# Patient Record
Sex: Female | Born: 1945 | Race: Black or African American | Hispanic: No | Smoking: Former smoker
Health system: Southern US, Community
[De-identification: ages and names within clinical notes are randomized; demographics above are authoritative.]

## PROBLEM LIST (undated history)

## (undated) DIAGNOSIS — D759 Disease of blood and blood-forming organs, unspecified: Secondary | ICD-10-CM

## (undated) DIAGNOSIS — D126 Benign neoplasm of colon, unspecified: Secondary | ICD-10-CM

## (undated) DIAGNOSIS — J849 Interstitial pulmonary disease, unspecified: Secondary | ICD-10-CM

## (undated) DIAGNOSIS — F32A Depression, unspecified: Secondary | ICD-10-CM

## (undated) DIAGNOSIS — H269 Unspecified cataract: Secondary | ICD-10-CM

## (undated) DIAGNOSIS — I10 Essential (primary) hypertension: Secondary | ICD-10-CM

## (undated) DIAGNOSIS — Z5189 Encounter for other specified aftercare: Secondary | ICD-10-CM

## (undated) DIAGNOSIS — F419 Anxiety disorder, unspecified: Secondary | ICD-10-CM

## (undated) DIAGNOSIS — K648 Other hemorrhoids: Secondary | ICD-10-CM

## (undated) DIAGNOSIS — R0602 Shortness of breath: Secondary | ICD-10-CM

## (undated) DIAGNOSIS — T7840XA Allergy, unspecified, initial encounter: Secondary | ICD-10-CM

## (undated) DIAGNOSIS — Z853 Personal history of malignant neoplasm of breast: Secondary | ICD-10-CM

## (undated) DIAGNOSIS — J449 Chronic obstructive pulmonary disease, unspecified: Secondary | ICD-10-CM

## (undated) DIAGNOSIS — D649 Anemia, unspecified: Secondary | ICD-10-CM

## (undated) DIAGNOSIS — C189 Malignant neoplasm of colon, unspecified: Secondary | ICD-10-CM

## (undated) DIAGNOSIS — G709 Myoneural disorder, unspecified: Secondary | ICD-10-CM

## (undated) DIAGNOSIS — IMO0002 Reserved for concepts with insufficient information to code with codable children: Secondary | ICD-10-CM

## (undated) DIAGNOSIS — R011 Cardiac murmur, unspecified: Secondary | ICD-10-CM

## (undated) DIAGNOSIS — C801 Malignant (primary) neoplasm, unspecified: Secondary | ICD-10-CM

## (undated) DIAGNOSIS — N189 Chronic kidney disease, unspecified: Secondary | ICD-10-CM

## (undated) DIAGNOSIS — D573 Sickle-cell trait: Secondary | ICD-10-CM

## (undated) DIAGNOSIS — K469 Unspecified abdominal hernia without obstruction or gangrene: Secondary | ICD-10-CM

## (undated) DIAGNOSIS — F329 Major depressive disorder, single episode, unspecified: Secondary | ICD-10-CM

## (undated) DIAGNOSIS — C50911 Malignant neoplasm of unspecified site of right female breast: Secondary | ICD-10-CM

## (undated) DIAGNOSIS — R232 Flushing: Secondary | ICD-10-CM

## (undated) DIAGNOSIS — M1990 Unspecified osteoarthritis, unspecified site: Secondary | ICD-10-CM

## (undated) DIAGNOSIS — Z85528 Personal history of other malignant neoplasm of kidney: Secondary | ICD-10-CM

## (undated) DIAGNOSIS — Z85118 Personal history of other malignant neoplasm of bronchus and lung: Secondary | ICD-10-CM

## (undated) DIAGNOSIS — Z85038 Personal history of other malignant neoplasm of large intestine: Secondary | ICD-10-CM

## (undated) DIAGNOSIS — K219 Gastro-esophageal reflux disease without esophagitis: Secondary | ICD-10-CM

## (undated) DIAGNOSIS — C3490 Malignant neoplasm of unspecified part of unspecified bronchus or lung: Secondary | ICD-10-CM

## (undated) HISTORY — DX: Other hemorrhoids: K64.8

## (undated) HISTORY — DX: Anxiety disorder, unspecified: F41.9

## (undated) HISTORY — DX: Benign neoplasm of colon, unspecified: D12.6

## (undated) HISTORY — DX: Essential (primary) hypertension: I10

## (undated) HISTORY — DX: Personal history of other malignant neoplasm of bronchus and lung: Z85.118

## (undated) HISTORY — PX: ABDOMINAL HYSTERECTOMY: SHX81

## (undated) HISTORY — DX: Cardiac murmur, unspecified: R01.1

## (undated) HISTORY — DX: Depression, unspecified: F32.A

## (undated) HISTORY — DX: Flushing: R23.2

## (undated) HISTORY — DX: Sickle-cell trait: D57.3

## (undated) HISTORY — DX: Major depressive disorder, single episode, unspecified: F32.9

## (undated) HISTORY — DX: Unspecified abdominal hernia without obstruction or gangrene: K46.9

## (undated) HISTORY — PX: BREAST LUMPECTOMY: SHX2

## (undated) HISTORY — PX: TUBAL LIGATION: SHX77

## (undated) HISTORY — DX: Personal history of other malignant neoplasm of kidney: Z85.528

## (undated) HISTORY — PX: COLON SURGERY: SHX602

## (undated) HISTORY — DX: Malignant (primary) neoplasm, unspecified: C80.1

## (undated) HISTORY — DX: Interstitial pulmonary disease, unspecified: J84.9

## (undated) HISTORY — DX: Personal history of other malignant neoplasm of large intestine: Z85.038

## (undated) HISTORY — PX: LUNG LOBECTOMY: SHX167

## (undated) HISTORY — PX: TONSILLECTOMY: SUR1361

## (undated) HISTORY — PX: COLONOSCOPY: SHX174

## (undated) HISTORY — PX: CATARACT EXTRACTION, BILATERAL: SHX1313

## (undated) HISTORY — DX: Anemia, unspecified: D64.9

## (undated) HISTORY — PX: PARTIAL HYSTERECTOMY: SHX80

## (undated) HISTORY — DX: Unspecified osteoarthritis, unspecified site: M19.90

## (undated) HISTORY — PX: BREAST BIOPSY: SHX20

## (undated) HISTORY — DX: Malignant neoplasm of unspecified site of right female breast: C50.911

## (undated) HISTORY — DX: Personal history of malignant neoplasm of breast: Z85.3

## (undated) HISTORY — PX: POLYPECTOMY: SHX149

---

## 1997-07-25 DIAGNOSIS — Z5189 Encounter for other specified aftercare: Secondary | ICD-10-CM

## 1997-07-25 DIAGNOSIS — N189 Chronic kidney disease, unspecified: Secondary | ICD-10-CM

## 1997-07-25 HISTORY — PX: KIDNEY SURGERY: SHX687

## 1997-07-25 HISTORY — DX: Encounter for other specified aftercare: Z51.89

## 1997-07-25 HISTORY — DX: Chronic kidney disease, unspecified: N18.9

## 1998-03-30 ENCOUNTER — Ambulatory Visit (HOSPITAL_COMMUNITY): Admission: RE | Admit: 1998-03-30 | Discharge: 1998-03-30 | Payer: Self-pay | Admitting: Gastroenterology

## 1998-04-03 ENCOUNTER — Other Ambulatory Visit: Admission: RE | Admit: 1998-04-03 | Discharge: 1998-04-03 | Payer: Self-pay | Admitting: Gastroenterology

## 1998-04-08 ENCOUNTER — Ambulatory Visit (HOSPITAL_COMMUNITY): Admission: RE | Admit: 1998-04-08 | Discharge: 1998-04-08 | Payer: Self-pay | Admitting: Gastroenterology

## 1998-04-08 ENCOUNTER — Encounter: Payer: Self-pay | Admitting: Gastroenterology

## 1998-04-10 ENCOUNTER — Ambulatory Visit (HOSPITAL_COMMUNITY): Admission: RE | Admit: 1998-04-10 | Discharge: 1998-04-10 | Payer: Self-pay | Admitting: Gastroenterology

## 1998-04-20 ENCOUNTER — Encounter (HOSPITAL_BASED_OUTPATIENT_CLINIC_OR_DEPARTMENT_OTHER): Payer: Self-pay | Admitting: General Surgery

## 1998-04-20 ENCOUNTER — Ambulatory Visit (HOSPITAL_COMMUNITY): Admission: RE | Admit: 1998-04-20 | Discharge: 1998-04-20 | Payer: Self-pay | Admitting: General Surgery

## 1998-04-27 ENCOUNTER — Encounter: Payer: Self-pay | Admitting: Urology

## 1998-04-27 ENCOUNTER — Ambulatory Visit (HOSPITAL_COMMUNITY): Admission: RE | Admit: 1998-04-27 | Discharge: 1998-04-27 | Payer: Self-pay | Admitting: Urology

## 1998-05-14 ENCOUNTER — Inpatient Hospital Stay (HOSPITAL_COMMUNITY): Admission: RE | Admit: 1998-05-14 | Discharge: 1998-05-19 | Payer: Self-pay | Admitting: General Surgery

## 1998-05-14 ENCOUNTER — Encounter (HOSPITAL_BASED_OUTPATIENT_CLINIC_OR_DEPARTMENT_OTHER): Payer: Self-pay | Admitting: General Surgery

## 1998-05-28 ENCOUNTER — Encounter: Admission: RE | Admit: 1998-05-28 | Discharge: 1998-08-26 | Payer: Self-pay | Admitting: General Surgery

## 1999-04-27 ENCOUNTER — Other Ambulatory Visit: Admission: RE | Admit: 1999-04-27 | Discharge: 1999-04-27 | Payer: Self-pay | Admitting: General Surgery

## 1999-05-14 ENCOUNTER — Encounter (HOSPITAL_BASED_OUTPATIENT_CLINIC_OR_DEPARTMENT_OTHER): Payer: Self-pay | Admitting: General Surgery

## 1999-05-17 ENCOUNTER — Ambulatory Visit (HOSPITAL_COMMUNITY): Admission: RE | Admit: 1999-05-17 | Discharge: 1999-05-17 | Payer: Self-pay | Admitting: General Surgery

## 1999-06-21 ENCOUNTER — Other Ambulatory Visit: Admission: RE | Admit: 1999-06-21 | Discharge: 1999-06-21 | Payer: Self-pay | Admitting: Gastroenterology

## 1999-06-21 ENCOUNTER — Encounter (INDEPENDENT_AMBULATORY_CARE_PROVIDER_SITE_OTHER): Payer: Self-pay

## 1999-07-26 DIAGNOSIS — C3490 Malignant neoplasm of unspecified part of unspecified bronchus or lung: Secondary | ICD-10-CM

## 1999-07-26 HISTORY — DX: Malignant neoplasm of unspecified part of unspecified bronchus or lung: C34.90

## 2000-04-11 ENCOUNTER — Ambulatory Visit (HOSPITAL_BASED_OUTPATIENT_CLINIC_OR_DEPARTMENT_OTHER): Admission: RE | Admit: 2000-04-11 | Discharge: 2000-04-11 | Payer: Self-pay | Admitting: Cardiology

## 2000-04-21 ENCOUNTER — Encounter: Payer: Self-pay | Admitting: Cardiology

## 2000-04-21 ENCOUNTER — Encounter: Admission: RE | Admit: 2000-04-21 | Discharge: 2000-04-21 | Payer: Self-pay | Admitting: Cardiology

## 2000-04-29 ENCOUNTER — Ambulatory Visit (HOSPITAL_COMMUNITY): Admission: RE | Admit: 2000-04-29 | Discharge: 2000-04-29 | Payer: Self-pay | Admitting: *Deleted

## 2000-05-02 ENCOUNTER — Encounter (HOSPITAL_BASED_OUTPATIENT_CLINIC_OR_DEPARTMENT_OTHER): Payer: Self-pay | Admitting: General Surgery

## 2000-05-02 ENCOUNTER — Encounter: Admission: RE | Admit: 2000-05-02 | Discharge: 2000-05-02 | Payer: Self-pay | Admitting: General Surgery

## 2000-05-06 ENCOUNTER — Encounter: Payer: Self-pay | Admitting: *Deleted

## 2000-05-06 ENCOUNTER — Ambulatory Visit (HOSPITAL_COMMUNITY): Admission: RE | Admit: 2000-05-06 | Discharge: 2000-05-06 | Payer: Self-pay | Admitting: *Deleted

## 2000-06-13 ENCOUNTER — Encounter: Payer: Self-pay | Admitting: Thoracic Surgery

## 2000-06-19 ENCOUNTER — Inpatient Hospital Stay (HOSPITAL_COMMUNITY): Admission: RE | Admit: 2000-06-19 | Discharge: 2000-06-24 | Payer: Self-pay | Admitting: Thoracic Surgery

## 2000-06-19 ENCOUNTER — Encounter (INDEPENDENT_AMBULATORY_CARE_PROVIDER_SITE_OTHER): Payer: Self-pay | Admitting: *Deleted

## 2000-06-19 ENCOUNTER — Encounter: Payer: Self-pay | Admitting: Thoracic Surgery

## 2000-06-20 ENCOUNTER — Encounter: Payer: Self-pay | Admitting: Thoracic Surgery

## 2000-06-21 ENCOUNTER — Encounter: Payer: Self-pay | Admitting: Thoracic Surgery

## 2000-06-22 ENCOUNTER — Encounter: Payer: Self-pay | Admitting: Thoracic Surgery

## 2000-06-23 ENCOUNTER — Encounter: Payer: Self-pay | Admitting: Thoracic Surgery

## 2000-06-24 ENCOUNTER — Encounter: Payer: Self-pay | Admitting: Thoracic Surgery

## 2000-06-30 ENCOUNTER — Encounter: Admission: RE | Admit: 2000-06-30 | Discharge: 2000-06-30 | Payer: Self-pay | Admitting: Thoracic Surgery

## 2000-06-30 ENCOUNTER — Encounter: Payer: Self-pay | Admitting: Thoracic Surgery

## 2000-08-02 ENCOUNTER — Encounter: Payer: Self-pay | Admitting: Thoracic Surgery

## 2000-08-02 ENCOUNTER — Encounter: Admission: RE | Admit: 2000-08-02 | Discharge: 2000-08-02 | Payer: Self-pay | Admitting: Thoracic Surgery

## 2000-08-10 ENCOUNTER — Encounter (INDEPENDENT_AMBULATORY_CARE_PROVIDER_SITE_OTHER): Payer: Self-pay | Admitting: *Deleted

## 2000-08-10 ENCOUNTER — Other Ambulatory Visit: Admission: RE | Admit: 2000-08-10 | Discharge: 2000-08-10 | Payer: Self-pay | Admitting: Gastroenterology

## 2000-10-17 ENCOUNTER — Encounter: Payer: Self-pay | Admitting: *Deleted

## 2000-10-17 ENCOUNTER — Ambulatory Visit (HOSPITAL_COMMUNITY): Admission: RE | Admit: 2000-10-17 | Discharge: 2000-10-17 | Payer: Self-pay | Admitting: *Deleted

## 2000-10-31 ENCOUNTER — Encounter: Payer: Self-pay | Admitting: Thoracic Surgery

## 2000-10-31 ENCOUNTER — Encounter: Admission: RE | Admit: 2000-10-31 | Discharge: 2000-10-31 | Payer: Self-pay | Admitting: Thoracic Surgery

## 2000-11-02 ENCOUNTER — Encounter: Payer: Self-pay | Admitting: Cardiology

## 2000-11-02 ENCOUNTER — Ambulatory Visit (HOSPITAL_COMMUNITY): Admission: RE | Admit: 2000-11-02 | Discharge: 2000-11-02 | Payer: Self-pay | Admitting: Cardiology

## 2001-01-30 ENCOUNTER — Encounter: Payer: Self-pay | Admitting: Thoracic Surgery

## 2001-01-30 ENCOUNTER — Encounter: Admission: RE | Admit: 2001-01-30 | Discharge: 2001-01-30 | Payer: Self-pay | Admitting: Thoracic Surgery

## 2001-01-31 ENCOUNTER — Encounter: Payer: Self-pay | Admitting: *Deleted

## 2001-01-31 ENCOUNTER — Ambulatory Visit (HOSPITAL_COMMUNITY): Admission: RE | Admit: 2001-01-31 | Discharge: 2001-01-31 | Payer: Self-pay | Admitting: *Deleted

## 2001-04-10 ENCOUNTER — Other Ambulatory Visit: Admission: RE | Admit: 2001-04-10 | Discharge: 2001-04-10 | Payer: Self-pay | Admitting: Obstetrics and Gynecology

## 2001-05-03 ENCOUNTER — Encounter: Payer: Self-pay | Admitting: *Deleted

## 2001-05-03 ENCOUNTER — Encounter: Admission: RE | Admit: 2001-05-03 | Discharge: 2001-05-03 | Payer: Self-pay | Admitting: *Deleted

## 2001-06-01 ENCOUNTER — Encounter: Admission: RE | Admit: 2001-06-01 | Discharge: 2001-06-01 | Payer: Self-pay | Admitting: Thoracic Surgery

## 2001-06-01 ENCOUNTER — Encounter: Payer: Self-pay | Admitting: Thoracic Surgery

## 2001-06-26 ENCOUNTER — Encounter: Payer: Self-pay | Admitting: Cardiology

## 2001-06-26 ENCOUNTER — Encounter: Admission: RE | Admit: 2001-06-26 | Discharge: 2001-06-26 | Payer: Self-pay | Admitting: Cardiology

## 2001-10-03 ENCOUNTER — Encounter: Admission: RE | Admit: 2001-10-03 | Discharge: 2001-10-03 | Payer: Self-pay | Admitting: Thoracic Surgery

## 2001-10-03 ENCOUNTER — Encounter: Payer: Self-pay | Admitting: Thoracic Surgery

## 2002-04-03 ENCOUNTER — Encounter: Admission: RE | Admit: 2002-04-03 | Discharge: 2002-04-03 | Payer: Self-pay | Admitting: Thoracic Surgery

## 2002-04-03 ENCOUNTER — Encounter: Payer: Self-pay | Admitting: Thoracic Surgery

## 2002-05-07 ENCOUNTER — Encounter: Admission: RE | Admit: 2002-05-07 | Discharge: 2002-05-07 | Payer: Self-pay | Admitting: *Deleted

## 2002-05-07 ENCOUNTER — Encounter: Payer: Self-pay | Admitting: *Deleted

## 2002-06-14 ENCOUNTER — Other Ambulatory Visit: Admission: RE | Admit: 2002-06-14 | Discharge: 2002-06-14 | Payer: Self-pay | Admitting: Obstetrics and Gynecology

## 2002-08-02 ENCOUNTER — Encounter: Admission: RE | Admit: 2002-08-02 | Discharge: 2002-08-02 | Payer: Self-pay | Admitting: Thoracic Surgery

## 2002-08-02 ENCOUNTER — Encounter: Payer: Self-pay | Admitting: Thoracic Surgery

## 2002-08-16 ENCOUNTER — Encounter: Payer: Self-pay | Admitting: *Deleted

## 2002-08-16 ENCOUNTER — Ambulatory Visit (HOSPITAL_COMMUNITY): Admission: RE | Admit: 2002-08-16 | Discharge: 2002-08-16 | Payer: Self-pay | Admitting: *Deleted

## 2003-01-28 ENCOUNTER — Encounter: Admission: RE | Admit: 2003-01-28 | Discharge: 2003-01-28 | Payer: Self-pay | Admitting: Thoracic Surgery

## 2003-01-28 ENCOUNTER — Encounter: Payer: Self-pay | Admitting: Thoracic Surgery

## 2003-02-21 ENCOUNTER — Encounter: Payer: Self-pay | Admitting: Cardiology

## 2003-02-21 ENCOUNTER — Encounter: Admission: RE | Admit: 2003-02-21 | Discharge: 2003-02-21 | Payer: Self-pay | Admitting: Cardiology

## 2003-03-10 ENCOUNTER — Encounter: Payer: Self-pay | Admitting: Hematology & Oncology

## 2003-03-10 ENCOUNTER — Ambulatory Visit (HOSPITAL_COMMUNITY): Admission: RE | Admit: 2003-03-10 | Discharge: 2003-03-10 | Payer: Self-pay | Admitting: Hematology & Oncology

## 2003-05-12 ENCOUNTER — Encounter: Admission: RE | Admit: 2003-05-12 | Discharge: 2003-05-12 | Payer: Self-pay | Admitting: Hematology & Oncology

## 2003-05-12 ENCOUNTER — Encounter: Payer: Self-pay | Admitting: Hematology & Oncology

## 2003-07-31 ENCOUNTER — Encounter: Admission: RE | Admit: 2003-07-31 | Discharge: 2003-07-31 | Payer: Self-pay | Admitting: Thoracic Surgery

## 2004-01-27 ENCOUNTER — Encounter: Admission: RE | Admit: 2004-01-27 | Discharge: 2004-01-27 | Payer: Self-pay | Admitting: Thoracic Surgery

## 2004-05-19 ENCOUNTER — Encounter: Admission: RE | Admit: 2004-05-19 | Discharge: 2004-05-19 | Payer: Self-pay | Admitting: Cardiology

## 2004-08-03 ENCOUNTER — Encounter: Admission: RE | Admit: 2004-08-03 | Discharge: 2004-08-03 | Payer: Self-pay | Admitting: Thoracic Surgery

## 2004-11-11 ENCOUNTER — Ambulatory Visit: Payer: Self-pay | Admitting: Gastroenterology

## 2004-11-25 ENCOUNTER — Ambulatory Visit: Payer: Self-pay | Admitting: Gastroenterology

## 2005-02-02 ENCOUNTER — Encounter: Admission: RE | Admit: 2005-02-02 | Discharge: 2005-02-02 | Payer: Self-pay | Admitting: Thoracic Surgery

## 2005-05-23 ENCOUNTER — Encounter: Admission: RE | Admit: 2005-05-23 | Discharge: 2005-05-23 | Payer: Self-pay | Admitting: Cardiology

## 2005-05-30 ENCOUNTER — Encounter: Admission: RE | Admit: 2005-05-30 | Discharge: 2005-05-30 | Payer: Self-pay | Admitting: Cardiology

## 2005-08-09 ENCOUNTER — Encounter: Admission: RE | Admit: 2005-08-09 | Discharge: 2005-08-09 | Payer: Self-pay | Admitting: Thoracic Surgery

## 2005-08-19 ENCOUNTER — Encounter: Admission: RE | Admit: 2005-08-19 | Discharge: 2005-08-19 | Payer: Self-pay | Admitting: General Surgery

## 2005-11-08 ENCOUNTER — Encounter: Admission: RE | Admit: 2005-11-08 | Discharge: 2005-11-08 | Payer: Self-pay | Admitting: Cardiology

## 2006-05-30 ENCOUNTER — Encounter: Admission: RE | Admit: 2006-05-30 | Discharge: 2006-05-30 | Payer: Self-pay | Admitting: General Surgery

## 2006-09-22 ENCOUNTER — Encounter: Admission: RE | Admit: 2006-09-22 | Discharge: 2006-09-22 | Payer: Self-pay | Admitting: General Surgery

## 2007-04-25 ENCOUNTER — Ambulatory Visit (HOSPITAL_BASED_OUTPATIENT_CLINIC_OR_DEPARTMENT_OTHER): Admission: RE | Admit: 2007-04-25 | Discharge: 2007-04-25 | Payer: Self-pay | Admitting: General Surgery

## 2007-06-05 ENCOUNTER — Encounter: Admission: RE | Admit: 2007-06-05 | Discharge: 2007-06-05 | Payer: Self-pay | Admitting: General Surgery

## 2007-11-19 ENCOUNTER — Ambulatory Visit: Payer: Self-pay | Admitting: Gastroenterology

## 2007-12-03 ENCOUNTER — Encounter: Payer: Self-pay | Admitting: Gastroenterology

## 2007-12-03 ENCOUNTER — Ambulatory Visit: Payer: Self-pay | Admitting: Gastroenterology

## 2007-12-04 ENCOUNTER — Ambulatory Visit: Payer: Self-pay | Admitting: Hematology & Oncology

## 2007-12-04 ENCOUNTER — Encounter: Payer: Self-pay | Admitting: Gastroenterology

## 2007-12-04 DIAGNOSIS — D126 Benign neoplasm of colon, unspecified: Secondary | ICD-10-CM | POA: Insufficient documentation

## 2007-12-07 ENCOUNTER — Telehealth: Payer: Self-pay | Admitting: Gastroenterology

## 2007-12-28 ENCOUNTER — Encounter: Payer: Self-pay | Admitting: Gastroenterology

## 2007-12-28 IMAGING — MG MM SCREEN MAMMOGRAM BILATERAL
8 of 10 series · 8 of 10 positions shown · non-contrast
Comparison: Prior studies.

DG SCREEN MAMMOGRAM BILATERAL
Bilateral CC and MLO view(s) were taken.
Prior study comparison: May 23, 2005, bilateral screening mammogram.

DIGITAL SCREENING MAMMOGRAM WITH CAD:

[R CC (1 of 3)]
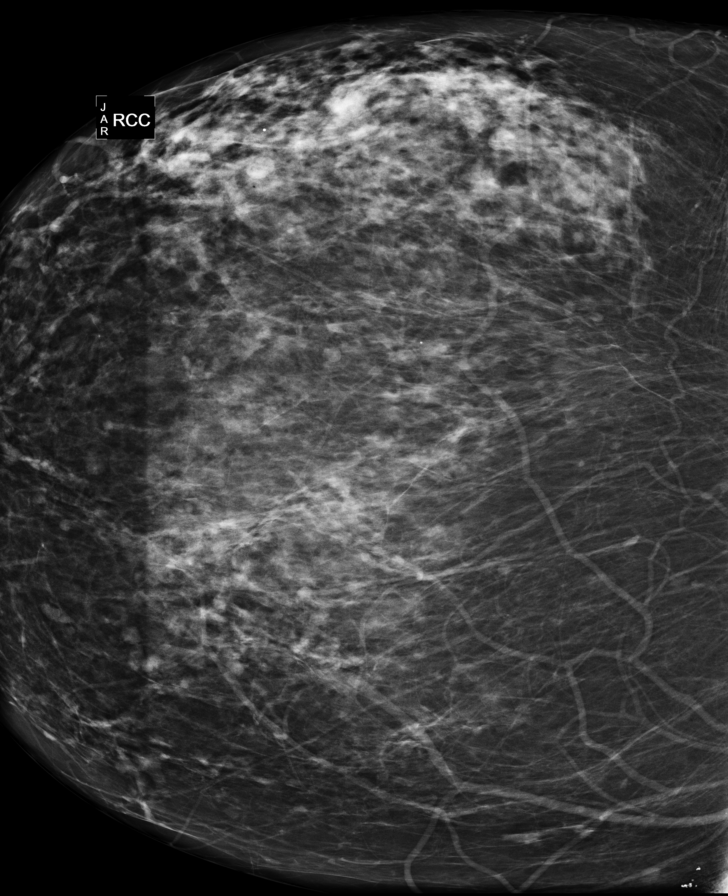

[L CC (1 of 3)]
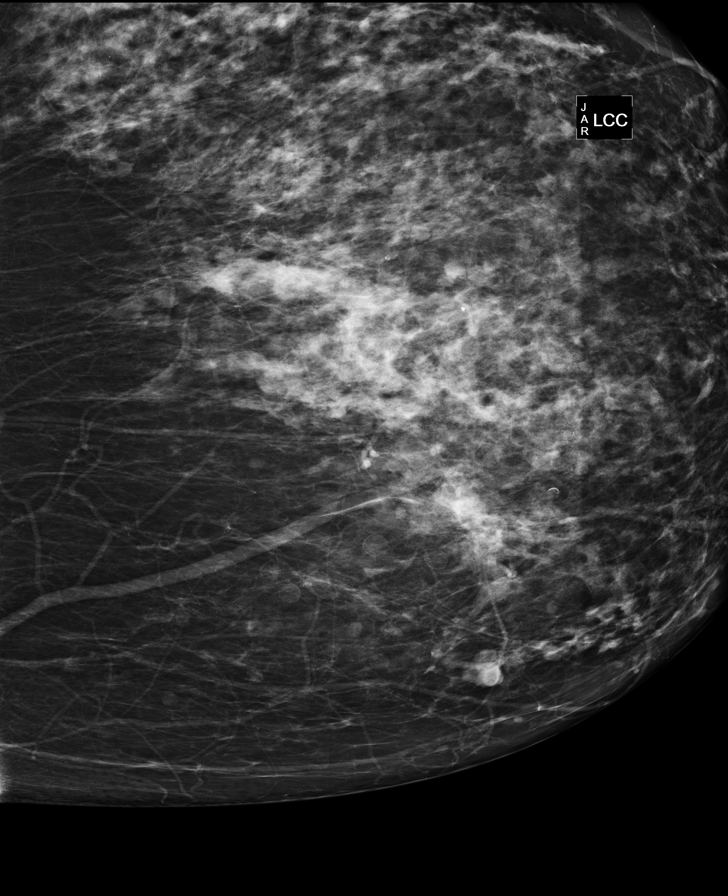

[L MLO]
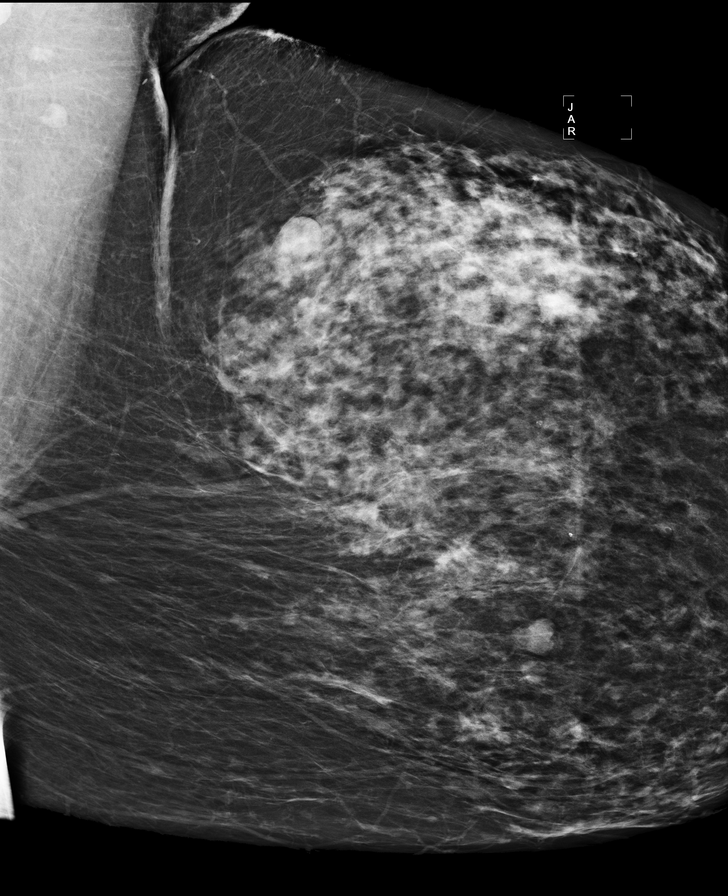

[R MLO]
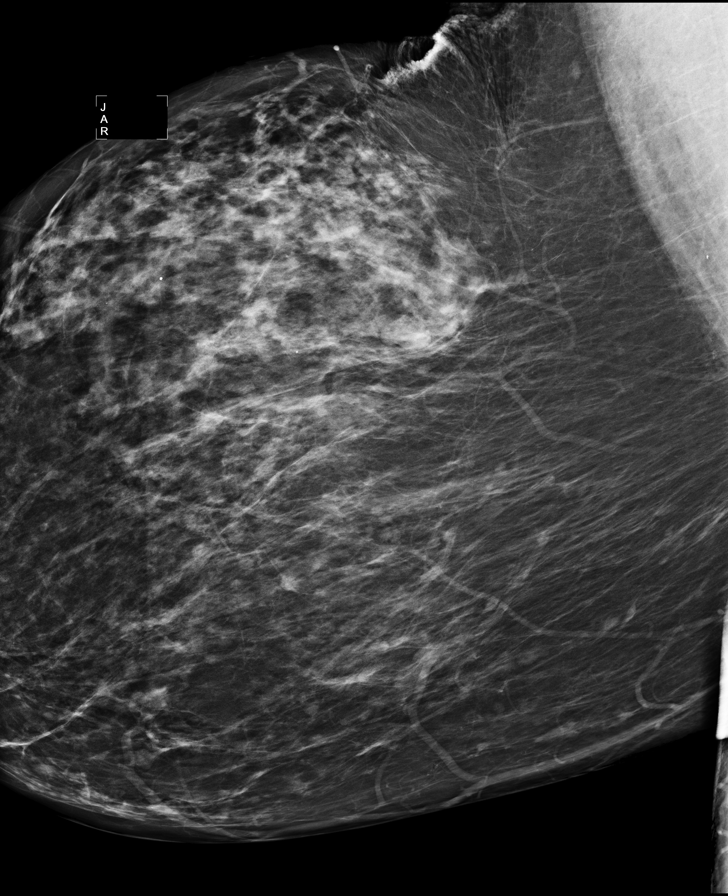

[R CC (2 of 3)]
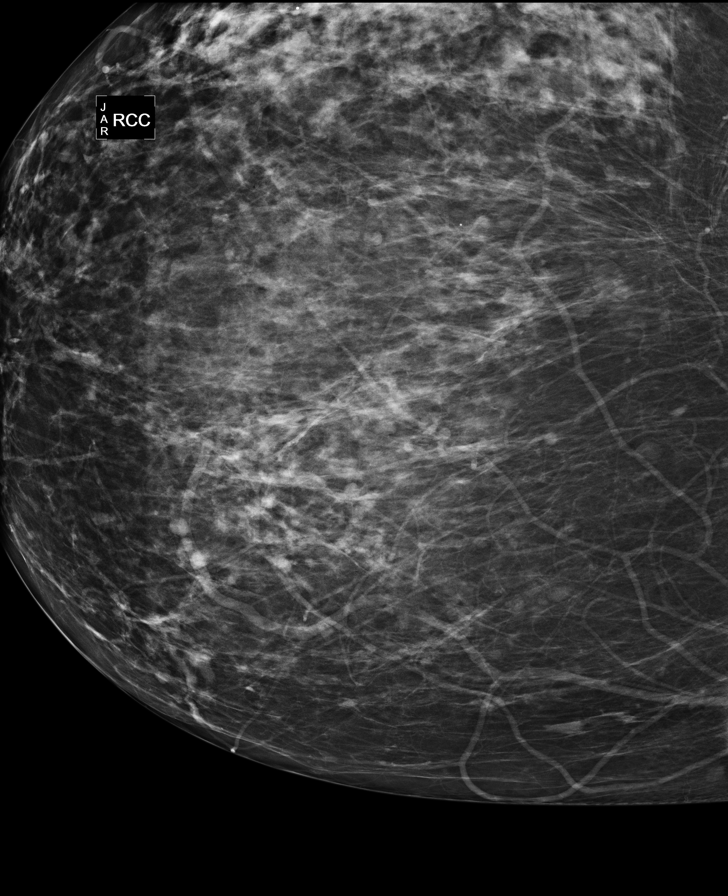

[R CC (3 of 3)]
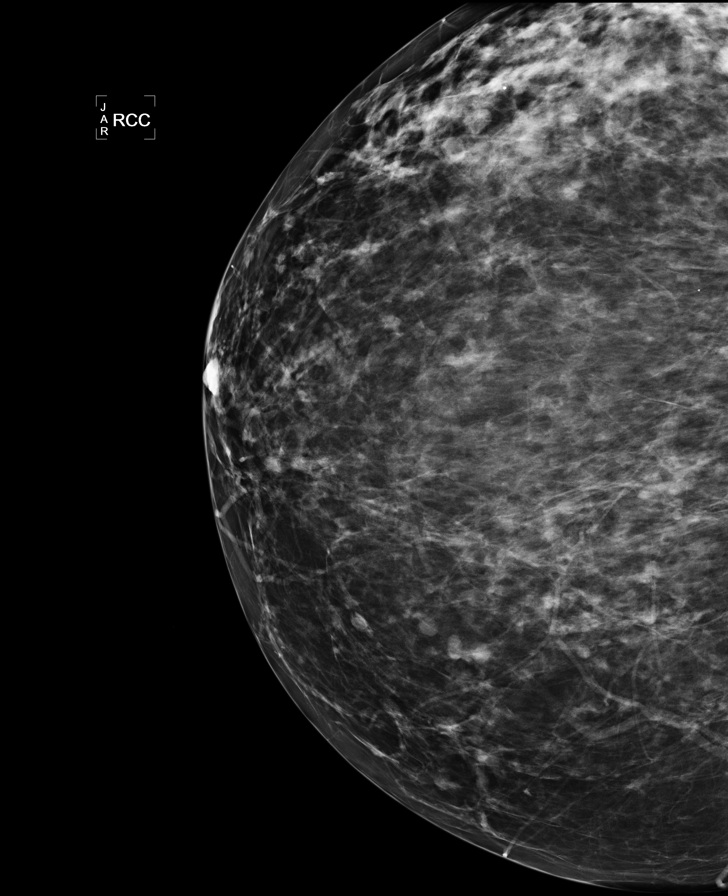

[L CC (2 of 3)]
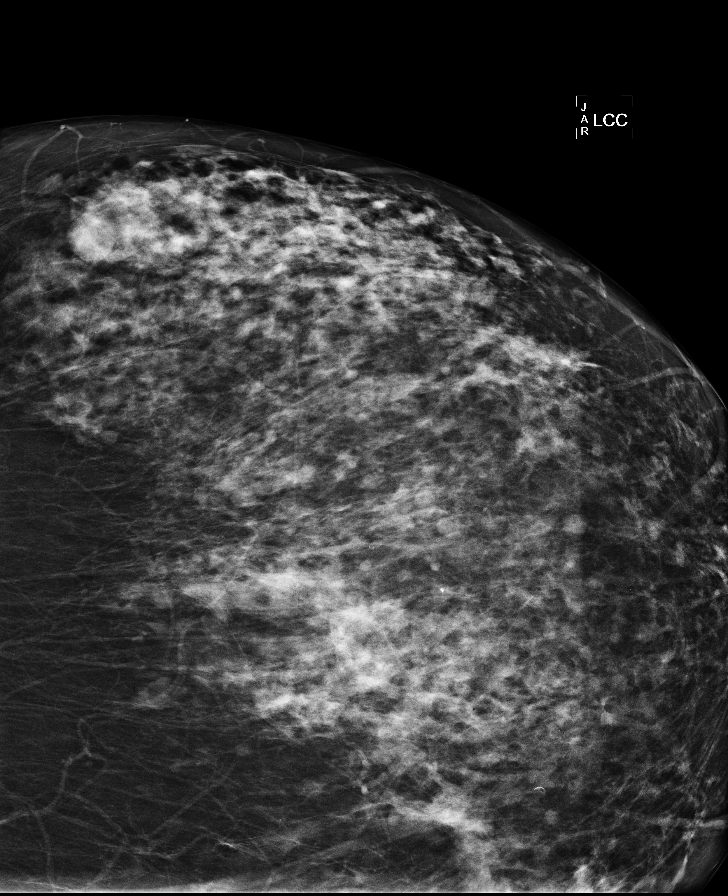

[L CC (3 of 3)]
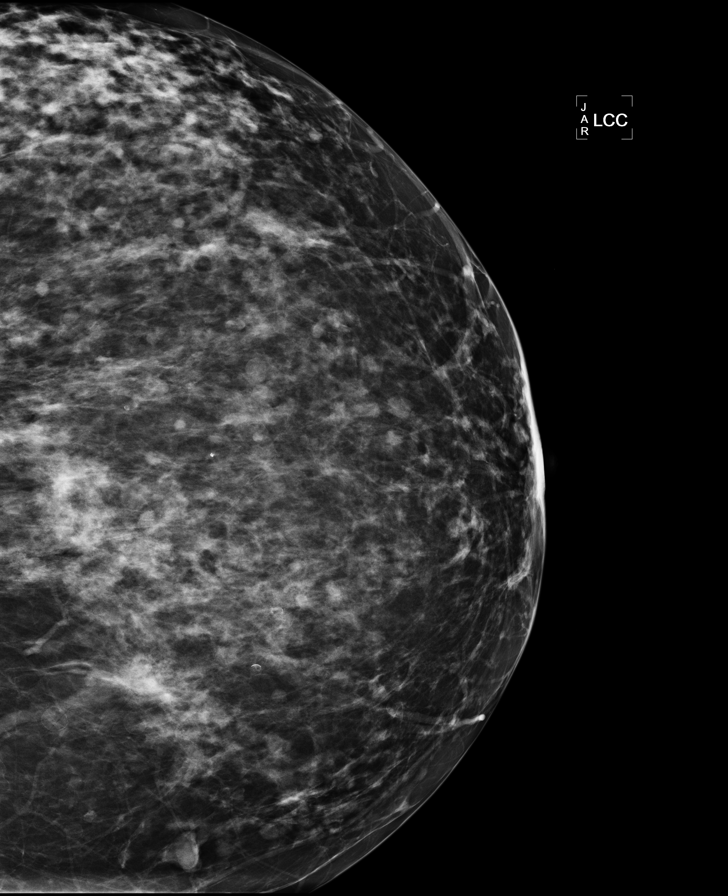

[8 of 10 positions shown; findings below may reference images not displayed]

The breast tissue is heterogeneously dense.  There is no dominant mass, architectural distortion or
calcification to suggest malignancy.
IMPRESSION: No mammographic evidence of malignancy.  Suggest yearly screening mammography.

ASSESSMENT: Negative - BI-RADS 1

Screening mammogram in 1 year.
ANALYZED BY COMPUTER AIDED DETECTION. , THIS PROCEDURE WAS A DIGITAL MAMMOGRAM.

## 2008-02-13 ENCOUNTER — Ambulatory Visit: Payer: Self-pay | Admitting: Hematology & Oncology

## 2008-06-05 ENCOUNTER — Encounter: Admission: RE | Admit: 2008-06-05 | Discharge: 2008-06-05 | Payer: Self-pay | Admitting: General Surgery

## 2008-07-16 ENCOUNTER — Encounter: Admission: RE | Admit: 2008-07-16 | Discharge: 2008-07-16 | Payer: Self-pay | Admitting: General Surgery

## 2008-10-17 ENCOUNTER — Encounter (INDEPENDENT_AMBULATORY_CARE_PROVIDER_SITE_OTHER): Payer: Self-pay | Admitting: *Deleted

## 2008-11-20 ENCOUNTER — Ambulatory Visit: Payer: Self-pay | Admitting: Gastroenterology

## 2008-12-01 ENCOUNTER — Telehealth: Payer: Self-pay | Admitting: Gastroenterology

## 2008-12-04 ENCOUNTER — Ambulatory Visit: Payer: Self-pay | Admitting: Gastroenterology

## 2008-12-04 ENCOUNTER — Encounter: Payer: Self-pay | Admitting: Gastroenterology

## 2008-12-07 ENCOUNTER — Encounter: Payer: Self-pay | Admitting: Gastroenterology

## 2009-01-07 ENCOUNTER — Encounter: Admission: RE | Admit: 2009-01-07 | Discharge: 2009-01-07 | Payer: Self-pay | Admitting: Cardiology

## 2009-01-09 ENCOUNTER — Encounter: Admission: RE | Admit: 2009-01-09 | Discharge: 2009-01-09 | Payer: Self-pay | Admitting: Cardiology

## 2009-03-06 ENCOUNTER — Encounter: Admission: RE | Admit: 2009-03-06 | Discharge: 2009-03-06 | Payer: Self-pay | Admitting: Cardiology

## 2009-07-22 ENCOUNTER — Encounter: Admission: RE | Admit: 2009-07-22 | Discharge: 2009-07-22 | Payer: Self-pay | Admitting: Cardiology

## 2009-09-19 ENCOUNTER — Encounter: Admission: RE | Admit: 2009-09-19 | Discharge: 2009-09-19 | Payer: Self-pay | Admitting: Orthopedic Surgery

## 2010-04-08 ENCOUNTER — Encounter: Admission: RE | Admit: 2010-04-08 | Discharge: 2010-04-08 | Payer: Self-pay | Admitting: Cardiology

## 2010-06-29 ENCOUNTER — Ambulatory Visit: Payer: Self-pay | Admitting: Psychiatry

## 2010-07-05 ENCOUNTER — Ambulatory Visit: Payer: Self-pay | Admitting: Psychiatry

## 2010-07-15 ENCOUNTER — Ambulatory Visit: Payer: Self-pay | Admitting: Psychiatry

## 2010-07-23 ENCOUNTER — Encounter
Admission: RE | Admit: 2010-07-23 | Discharge: 2010-07-23 | Payer: Self-pay | Source: Home / Self Care | Attending: Cardiology | Admitting: Cardiology

## 2010-07-28 ENCOUNTER — Ambulatory Visit
Admission: RE | Admit: 2010-07-28 | Discharge: 2010-07-28 | Payer: Self-pay | Source: Home / Self Care | Attending: Psychiatry | Admitting: Psychiatry

## 2010-08-14 ENCOUNTER — Encounter: Payer: Self-pay | Admitting: Hematology & Oncology

## 2010-08-15 ENCOUNTER — Encounter: Payer: Self-pay | Admitting: Hematology & Oncology

## 2010-08-15 ENCOUNTER — Encounter: Payer: Self-pay | Admitting: Thoracic Surgery

## 2010-08-16 ENCOUNTER — Encounter: Payer: Self-pay | Admitting: Cardiology

## 2010-08-19 ENCOUNTER — Ambulatory Visit
Admission: RE | Admit: 2010-08-19 | Discharge: 2010-08-19 | Payer: Self-pay | Source: Home / Self Care | Attending: Psychiatry | Admitting: Psychiatry

## 2010-09-02 ENCOUNTER — Ambulatory Visit: Payer: Self-pay | Admitting: Psychiatry

## 2010-09-20 ENCOUNTER — Ambulatory Visit (INDEPENDENT_AMBULATORY_CARE_PROVIDER_SITE_OTHER): Payer: 59 | Admitting: Psychiatry

## 2010-09-20 DIAGNOSIS — F331 Major depressive disorder, recurrent, moderate: Secondary | ICD-10-CM

## 2010-09-20 DIAGNOSIS — F17200 Nicotine dependence, unspecified, uncomplicated: Secondary | ICD-10-CM

## 2010-10-18 ENCOUNTER — Ambulatory Visit (INDEPENDENT_AMBULATORY_CARE_PROVIDER_SITE_OTHER): Payer: 59 | Admitting: Psychiatry

## 2010-10-18 DIAGNOSIS — F331 Major depressive disorder, recurrent, moderate: Secondary | ICD-10-CM

## 2010-10-18 DIAGNOSIS — F17200 Nicotine dependence, unspecified, uncomplicated: Secondary | ICD-10-CM

## 2010-11-18 ENCOUNTER — Ambulatory Visit (INDEPENDENT_AMBULATORY_CARE_PROVIDER_SITE_OTHER): Payer: 59 | Admitting: Psychiatry

## 2010-11-18 DIAGNOSIS — F331 Major depressive disorder, recurrent, moderate: Secondary | ICD-10-CM

## 2010-11-18 DIAGNOSIS — F17200 Nicotine dependence, unspecified, uncomplicated: Secondary | ICD-10-CM

## 2010-12-10 NOTE — Op Note (Signed)
[place]. [facility]  Patient:    Amy [name], Amy [name]                        MRN: [id number] Adm. Date:  [id number] Attending:  Cameron Proud CC:         Two copies to Dr. Karle Plumber L. Lurene Shadow, M.D.  Dolan Amen, M.D.   Operative Report  PREOPERATIVE DIAGNOSIS:  Nodule superior segment right lower lobe, status post colon resection for cancer, status post right nephrectomy for renal cell cancer.  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  OPERATION:  Right video-assisted thoracic surgery, right thoracotomy, right superior segmentectomy.  SURGEON:  D. Karle Plumber, M.D.  FIRST ASSISTANT:  Carlye Grippe.  ANESTHESIA:  General  DESCRIPTION OF PROCEDURE:  After percutaneous insertion of all monitoring lines, the patient had a dual lumen tube inserted, was turned to the right lateral thoracotomy position.  Two trocar sites were made in the anterior posterior axillary line at the 7th intercostal space.  Two trocars were inserted.  A 30 degree scope was inserted and exploration was carried out. The patient had a lot of intrapulmonary lymph nodes.  The CT scan showed this to be in the superior segment of the right lower lobe; however, it could not be visualized on the pleural surface so a 5 cm incision was made over the 6th intercostal space with the latissimus being divided and the serratus being split.  The 6th intercostal space was entered and a TPA was inserted.  Digital palpation was carried out of the right lower lobe.  The lesion was difficult to palpate but was finally palpated in the superior segment.  A wedge resection of two intrapulmonary lymph nodes were done, one from the right middle lobe using a SCB45 Ethicon stapler and one from the right lower lobe also using the Ethicon stapler.  A right superior segmentectomy by dissecting the down fissure until the  superior segment of pulmonary artery was identified.  There were a lot of nodes around the pulmonary artery and several were removed.  Then the two branches to the superior segment were dissected out, doubly ligated and divided.  Then the major fissure was divided with two applications of Autosuture 60 stapler and one of the Ethicon stapler.  Then that exposed the superior segmental bronchus and that was divided with the Ethicon SCB 45 stapler and finally the rest of the superior segment was done with three applications of the Autosuture stapler.  The lesion which was about 1 cm lesion was sent for frozen section and was an adenocarcinoma probably metastatic.  Several 10L and 10R and 11R nodes were dissected free and sent for pathologic examination.  Two chest tubes were placed through the trocar sites and tied in place with 0 silk.  The lung was re-expanded.  The chest was closed with three chromic pericostals of #1 in the muscle layer and 2-0 Vicryl in the subcutaneous tissue and Ethicon skin clips.  The patient had an epidural placed and returned to the recovery room in stable condition. DD:  06/19/00 TD:  06/19/00 Job: [id number]

## 2010-12-10 NOTE — Discharge Summary (Signed)
[place]. [facility]  Patient:    Amy [name], Amy [name]                        MRN: [id number] Adm. Date:  [id number] Disc. Date: 06/24/00 Attending:  Cameron Proud Dictator:   Myrlene Broker, P.A. CC:         Dolan Amen, M.D.  Mardene Celeste Lurene Shadow, M.D.  Osvaldo Shipper. Spruill, M.D.  Lindaann Slough, M.D.  [name]. Pleas Koch., M.D. [facility]   Discharge Summary  DATE OF BIRTH:  1946/02/01  REFERRING PHYSICIAN:  Dr. Dolan Amen.  ADMISSION AND DISCHARGES DIAGNOSES:  1. Right lower lung lesion, pending pathology.  2. Adult onset diabetes mellitus, diet controlled.  3. Hypertension.  4. Gastroesophageal reflux disease.  5. Depression.  6. History of T1,N0,M0 renal cell carcinoma.  7. History of T3,N1,M0 right colon cancer with subsequent chemo.  8. Chronic morbid obesity.  9. Chronic anemia. 10. Mild sleep apnea.  PROCEDURES:  Right video-assisted thoracoscopic thoracotomy, right lower lung superior segmentectomy, and right median lobe/right lower lobe lung biopsy, lymph node biopsy.  HOSPITAL COURSE:  Following adequate preparation and informed consent and risk consultation with Dr. Dolan Amen, [name] was admitted and brought to the operating room on June 19, 2000, where she underwent the procedures as stated above.  Her postoperative course was without complications, and she resumed normal diet and activity.  At the time of discharge her wounds were healing.  DISPOSITION/CONDITION ON DISCHARGE:  The patient is discharged to her home in stable condition.  DISCHARGE MEDICATIONS:  She is discharged on the following medications:  1. Vicodin one to two tabs q.4-6h. p.r.n. pain  2. Lopressor 100 mg one p.o. q.d.  3. Hydrochlorothiazide 25 mg one p.o. q.d.  4. Paxil 20 mg one p.o. q.d.  5. Prilosec 40 mg one p.o. q.d.  6. Ambien 10 mg one p.o. q.h.s.  7. Iron tablet q.d.  8. Vitamin C with rosehips tablet 1000 mg q.d.  9.  Centrum Silver vitamins one q.d.  DISCHARGE FOLLOWUP:  The patient is to follow up with Dr. Nevada Crane on July 06, 2000, at 11:30 at the lab and meeting with the doctor at noon and follow up with Dr. Edwyna Shell on June 30, 2000, at 2:50 p.m. after an x-ray at the [facility] at 1:20 p.m.  DISCHARGE INSTRUCTIONS:  The patient was instructed for no driving, strenuous activity, or heavy lifting, to follow a healthy diet with low sodium, and follow a diabetic diet.  Was instructed to clean the wound with warm soap and water and use fresh linens daily and to call the office if wound changes in appearance or if there is a temperature greater than 100 degrees Fahrenheit. DD:  06/23/00 TD:  06/23/00 Job: [id number]

## 2010-12-23 ENCOUNTER — Ambulatory Visit (INDEPENDENT_AMBULATORY_CARE_PROVIDER_SITE_OTHER): Payer: 59 | Admitting: Psychiatry

## 2010-12-23 DIAGNOSIS — F331 Major depressive disorder, recurrent, moderate: Secondary | ICD-10-CM

## 2010-12-23 DIAGNOSIS — F17200 Nicotine dependence, unspecified, uncomplicated: Secondary | ICD-10-CM

## 2011-01-06 ENCOUNTER — Ambulatory Visit (AMBULATORY_SURGERY_CENTER): Payer: Medicare Other | Admitting: *Deleted

## 2011-01-06 VITALS — Ht 66.0 in | Wt 276.0 lb

## 2011-01-06 DIAGNOSIS — Z85038 Personal history of other malignant neoplasm of large intestine: Secondary | ICD-10-CM

## 2011-01-06 MED ORDER — PEG-KCL-NACL-NASULF-NA ASC-C 100 G PO SOLR: ORAL | Status: DC

## 2011-01-20 ENCOUNTER — Encounter: Payer: Self-pay | Admitting: Gastroenterology

## 2011-01-20 ENCOUNTER — Ambulatory Visit (AMBULATORY_SURGERY_CENTER): Payer: 59 | Admitting: Gastroenterology

## 2011-01-20 DIAGNOSIS — Z85038 Personal history of other malignant neoplasm of large intestine: Secondary | ICD-10-CM

## 2011-01-20 DIAGNOSIS — D126 Benign neoplasm of colon, unspecified: Secondary | ICD-10-CM

## 2011-01-20 DIAGNOSIS — Z1211 Encounter for screening for malignant neoplasm of colon: Secondary | ICD-10-CM

## 2011-01-20 DIAGNOSIS — Z86010 Personal history of colonic polyps: Secondary | ICD-10-CM

## 2011-01-20 DIAGNOSIS — Z859 Personal history of malignant neoplasm, unspecified: Secondary | ICD-10-CM

## 2011-01-20 LAB — GLUCOSE, CAPILLARY

## 2011-01-20 MED ORDER — SODIUM CHLORIDE 0.9 % IV SOLN: 500.0000 mL | INTRAVENOUS | Status: DC

## 2011-01-20 NOTE — Patient Instructions (Signed)
Follow discharge instructions(blue &green sheets)  Information on polyps &hemorrhoids given to pt.    You need to repeat colonoscopy on one year

## 2011-01-21 ENCOUNTER — Telehealth: Payer: Self-pay

## 2011-01-21 NOTE — Telephone Encounter (Signed)
Left message on answering machine. 

## 2011-01-27 ENCOUNTER — Encounter: Payer: Self-pay | Admitting: Gastroenterology

## 2011-02-02 ENCOUNTER — Ambulatory Visit (INDEPENDENT_AMBULATORY_CARE_PROVIDER_SITE_OTHER): Payer: 59 | Admitting: Psychiatry

## 2011-02-02 DIAGNOSIS — F331 Major depressive disorder, recurrent, moderate: Secondary | ICD-10-CM

## 2011-02-02 DIAGNOSIS — F17200 Nicotine dependence, unspecified, uncomplicated: Secondary | ICD-10-CM

## 2011-03-09 ENCOUNTER — Ambulatory Visit: Payer: 59 | Admitting: Psychiatry

## 2011-03-31 ENCOUNTER — Other Ambulatory Visit (HOSPITAL_COMMUNITY): Payer: Self-pay | Admitting: Cardiology

## 2011-04-01 ENCOUNTER — Other Ambulatory Visit (HOSPITAL_COMMUNITY): Payer: Self-pay | Admitting: Cardiology

## 2011-04-01 DIAGNOSIS — R52 Pain, unspecified: Secondary | ICD-10-CM

## 2011-04-12 ENCOUNTER — Other Ambulatory Visit (HOSPITAL_COMMUNITY): Payer: Self-pay | Admitting: Cardiology

## 2011-04-14 ENCOUNTER — Ambulatory Visit (HOSPITAL_COMMUNITY)
Admission: RE | Admit: 2011-04-14 | Discharge: 2011-04-14 | Disposition: A | Discharge status: Home / Self Care | Payer: Medicare Other | Source: Ambulatory Visit | Attending: Cardiology | Admitting: Cardiology

## 2011-04-14 ENCOUNTER — Encounter (HOSPITAL_COMMUNITY)
Admission: RE | Admit: 2011-04-14 | Discharge: 2011-04-14 | Disposition: A | Discharge status: Home / Self Care | Payer: 59 | Source: Ambulatory Visit | Attending: Cardiology | Admitting: Cardiology

## 2011-04-14 DIAGNOSIS — R079 Chest pain, unspecified: Secondary | ICD-10-CM | POA: Insufficient documentation

## 2011-04-14 DIAGNOSIS — E119 Type 2 diabetes mellitus without complications: Secondary | ICD-10-CM | POA: Insufficient documentation

## 2011-04-15 ENCOUNTER — Inpatient Hospital Stay (HOSPITAL_COMMUNITY): Admission: RE | Admit: 2011-04-15 | Payer: 59 | Source: Ambulatory Visit

## 2011-04-15 ENCOUNTER — Other Ambulatory Visit (HOSPITAL_COMMUNITY): Payer: 59

## 2011-04-15 MED ORDER — TECHNETIUM TC 99M TETROFOSMIN IV KIT
10.0000 | PACK | Freq: Once | INTRAVENOUS | Status: AC | PRN
  Administered 2011-04-15: 10 via INTRAVENOUS

## 2011-04-15 MED ORDER — TECHNETIUM TC 99M TETROFOSMIN IV KIT
30.0000 | PACK | Freq: Once | INTRAVENOUS | Status: AC | PRN
  Administered 2011-04-15: 30 via INTRAVENOUS

## 2011-05-05 LAB — COMPREHENSIVE METABOLIC PANEL
AST: 18
BUN: 14
CO2: 34 — ABNORMAL HIGH
Chloride: 99
Creatinine, Ser: 0.96
GFR calc non Af Amer: 59 — ABNORMAL LOW
Glucose, Bld: 131 — ABNORMAL HIGH
Total Bilirubin: 0.7

## 2011-05-05 LAB — CBC
HCT: 39.5
Hemoglobin: 13.3
MCV: 85.3
Platelets: 159
RBC: 4.63
WBC: 5.6

## 2011-05-05 LAB — URINALYSIS, ROUTINE W REFLEX MICROSCOPIC
Bilirubin Urine: NEGATIVE
Hgb urine dipstick: NEGATIVE
Protein, ur: NEGATIVE
Urobilinogen, UA: 1

## 2011-05-05 LAB — DIFFERENTIAL
Basophils Absolute: 0
Eosinophils Relative: 2
Lymphocytes Relative: 35
Neutro Abs: 3.2

## 2011-05-05 LAB — PROTIME-INR: Prothrombin Time: 13.7

## 2011-05-05 LAB — POCT HEMOGLOBIN-HEMACUE: Hemoglobin: 14.1

## 2011-05-05 LAB — URINE MICROSCOPIC-ADD ON

## 2011-05-18 ENCOUNTER — Ambulatory Visit (HOSPITAL_BASED_OUTPATIENT_CLINIC_OR_DEPARTMENT_OTHER): Admission: RE | Admit: 2011-05-18 | Payer: Medicare Other | Source: Ambulatory Visit | Admitting: Orthopedic Surgery

## 2011-07-04 ENCOUNTER — Other Ambulatory Visit: Payer: Self-pay | Admitting: Cardiology

## 2011-07-04 DIAGNOSIS — Z1231 Encounter for screening mammogram for malignant neoplasm of breast: Secondary | ICD-10-CM

## 2011-07-06 ENCOUNTER — Other Ambulatory Visit: Payer: Self-pay | Admitting: Cardiology

## 2011-07-16 ENCOUNTER — Other Ambulatory Visit: Payer: Self-pay | Admitting: Cardiology

## 2011-07-17 ENCOUNTER — Other Ambulatory Visit: Payer: Self-pay | Admitting: Cardiology

## 2011-07-28 ENCOUNTER — Ambulatory Visit
Admission: RE | Admit: 2011-07-28 | Discharge: 2011-07-28 | Disposition: A | Discharge status: Home / Self Care | Payer: Medicare Other | Source: Ambulatory Visit | Attending: Cardiology | Admitting: Cardiology

## 2011-07-28 DIAGNOSIS — Z1231 Encounter for screening mammogram for malignant neoplasm of breast: Secondary | ICD-10-CM

## 2011-07-31 ENCOUNTER — Other Ambulatory Visit: Payer: Self-pay | Admitting: Cardiology

## 2011-11-28 ENCOUNTER — Other Ambulatory Visit: Payer: Self-pay | Admitting: Cardiology

## 2012-01-24 ENCOUNTER — Other Ambulatory Visit: Payer: Self-pay | Admitting: Cardiology

## 2012-02-08 ENCOUNTER — Encounter: Payer: Self-pay | Admitting: Gastroenterology

## 2012-03-19 ENCOUNTER — Ambulatory Visit (AMBULATORY_SURGERY_CENTER): Payer: Medicare Other

## 2012-03-19 VITALS — Ht 66.0 in | Wt 268.0 lb

## 2012-03-19 DIAGNOSIS — Z1211 Encounter for screening for malignant neoplasm of colon: Secondary | ICD-10-CM

## 2012-03-19 MED ORDER — MOVIPREP 100 G PO SOLR: 1.0000 | Freq: Once | ORAL | Status: DC

## 2012-03-20 ENCOUNTER — Encounter: Payer: Self-pay | Admitting: Gastroenterology

## 2012-04-02 ENCOUNTER — Other Ambulatory Visit: Payer: Self-pay | Admitting: Gastroenterology

## 2012-04-02 ENCOUNTER — Encounter: Payer: Self-pay | Admitting: Gastroenterology

## 2012-04-02 ENCOUNTER — Other Ambulatory Visit: Payer: Self-pay

## 2012-04-02 ENCOUNTER — Other Ambulatory Visit (INDEPENDENT_AMBULATORY_CARE_PROVIDER_SITE_OTHER): Payer: Medicare Other

## 2012-04-02 ENCOUNTER — Ambulatory Visit (AMBULATORY_SURGERY_CENTER): Payer: Medicare Other | Admitting: Gastroenterology

## 2012-04-02 VITALS — BP 117/73 | HR 71 | Temp 97.6°F | Resp 18 | Ht 66.0 in | Wt 268.0 lb

## 2012-04-02 DIAGNOSIS — C189 Malignant neoplasm of colon, unspecified: Secondary | ICD-10-CM

## 2012-04-02 DIAGNOSIS — D126 Benign neoplasm of colon, unspecified: Secondary | ICD-10-CM

## 2012-04-02 DIAGNOSIS — Z85038 Personal history of other malignant neoplasm of large intestine: Secondary | ICD-10-CM

## 2012-04-02 DIAGNOSIS — Z1211 Encounter for screening for malignant neoplasm of colon: Secondary | ICD-10-CM

## 2012-04-02 DIAGNOSIS — K6389 Other specified diseases of intestine: Secondary | ICD-10-CM

## 2012-04-02 LAB — COMPREHENSIVE METABOLIC PANEL
ALT: 19 U/L (ref 0–35)
Albumin: 4.1 g/dL (ref 3.5–5.2)
CO2: 33 mEq/L — ABNORMAL HIGH (ref 19–32)
Calcium: 9.4 mg/dL (ref 8.4–10.5)
Chloride: 98 mEq/L (ref 96–112)
GFR: 72.99 mL/min (ref >60.00)
Potassium: 3.6 mEq/L (ref 3.5–5.1)
Sodium: 139 mEq/L (ref 135–145)
Total Protein: 7 g/dL (ref 6.0–8.3)

## 2012-04-02 LAB — GLUCOSE, CAPILLARY: Glucose-Capillary: 120 mg/dL — ABNORMAL HIGH (ref 70–99)

## 2012-04-02 LAB — CBC WITH DIFFERENTIAL/PLATELET
Basophils Absolute: 0 10*3/uL (ref 0.0–0.1)
Eosinophils Absolute: 0 10*3/uL (ref 0.0–0.7)
Lymphocytes Relative: 32.2 % (ref 12.0–46.0)
Monocytes Relative: 4.7 % (ref 3.0–12.0)
Platelets: 140 10*3/uL — ABNORMAL LOW (ref 150.0–400.0)
RDW: 15.3 % — ABNORMAL HIGH (ref 11.5–14.6)

## 2012-04-02 LAB — CEA: CEA: 1.6 ng/mL (ref 0.0–5.0)

## 2012-04-02 MED ORDER — SODIUM CHLORIDE 0.9 % IV SOLN: 500.0000 mL | INTRAVENOUS | Status: DC

## 2012-04-02 NOTE — Progress Notes (Signed)
Patient did not experience any of the following events: a burn prior to discharge; a fall within the facility; wrong site/side/patient/procedure/implant event; or a hospital transfer or hospital admission upon discharge from the facility. (G8907) Patient did not have preoperative order for IV antibiotic SSI prophylaxis. (G8918)  

## 2012-04-02 NOTE — Progress Notes (Signed)
Pressure applied to the abdomen to reach the cecum 

## 2012-04-02 NOTE — Op Note (Signed)
[facility] [address] [place], [address]   COLONOSCOPY PROCEDURE REPORT PATIENT: Amy [name], Amy [name]  MR#: [id number] BIRTHDATE: 03-Sep-1945 , 66  yrs. old GENDER: Female ENDOSCOPIST: Meryl Dare, MD, [facility] Referring Physician] PROCEDURE DATE:  04/02/2012 PROCEDURE:   Colonoscopy with snare polypectomy, Colonoscopy with biopsy, and Submucosal injection, [place] ink ASA CLASS:   Class II INDICATIONS:high risk patient with personal history of colon cancer.  MEDICATIONS: MAC sedation, administered by CRNA and propofol (Diprivan) 250mg  IV DESCRIPTION OF PROCEDURE:   After the risks benefits and alternatives of the procedure were thoroughly explained, informed consent was obtained.  A digital rectal exam revealed no abnormalities of the rectum.   The LB [id number]  endoscope was introduced through the anus and advanced to the terminal ileum which was intubated for a short distance. No adverse events experienced.   The quality of the prep was adequate, using MoviPrep The instrument was then slowly withdrawn as the colon was fully examined.   COLON FINDINGS: Two sessile polyps ranging between 5-6 mm in size were found in the proximal transverse colon.  A polypectomy was performed with a cold snare.  The resection was complete and the polyp tissue was completely retrieved.   An ulcerated, sessile, firm tumor/mass, measuring 2cm, was seen in the distal transverse colon. The most likely pathology could not be predicted by colonoscopy.  Multiple biopsies of the lesion were performed.  Two tattoos were applied 5 cm proximal to and 5 cm distal to the tumor. There was evidence of a prior ileocolonic surgical anastomosis, right hemicolectomy in the ascending colon.   The colon was otherwise normal.  There was no diverticulosis, inflamation, polyps or cancers unless previously stated.  Retroflexed views revealed no abnormalities. The time to cecum=2 minutes 30  seconds.  Withdrawal time=17 minutes 41 seconds.  The scope was withdrawn and the procedure completed. COMPLICATIONS: There were no complications.  ENDOSCOPIC IMPRESSION: 1.   Two sessile polypsin the proximal transverse colon; polypectomy was performed with a cold snare 2.   Tumor/mass in the distal transverse colon; multiple biopsies performed; a tattoo was applied 3.   There was evidence of a prior ileocolonic surgical anastomosis in the ascending colon  RECOMMENDATIONS: 1.  await pathology results 2.  My office will arrange for you to have a CT scan of abdomen and pelvis. 3.  surgical consult 4.  repeat Colonoscopy in 1 year.  eSigned:  Meryl Dare, MD, [facility] 04/02/2012 11:09 AM  cc: Renford Dills MD   PATIENT NAME:  Amy [name], Amy [name] MR#: [id number]

## 2012-04-02 NOTE — Progress Notes (Signed)
If her allergy is not severe then premedicate and use IV contrast

## 2012-04-02 NOTE — Patient Instructions (Addendum)

## 2012-04-02 NOTE — Progress Notes (Signed)
Dr. [name] patient has a history of IVP dye.  Looks like previous CTs are done without contrast.  Do you want this without contrast or do you want her pre-medicated

## 2012-04-03 ENCOUNTER — Telehealth: Payer: Self-pay | Admitting: *Deleted

## 2012-04-03 NOTE — Telephone Encounter (Signed)
  Follow up Call-  Call back number 04/02/2012  Post procedure Call Back phone  # [phone] hm  Permission to leave phone message Yes     Patient questions:  Do you have a fever, pain , or abdominal swelling? no Pain Score  0 *  Have you tolerated food without any problems? yes  Have you been able to return to your normal activities? yes  Do you have any questions about your discharge instructions: Diet   no Medications  no Follow up visit  no  Do you have questions or concerns about your Care? no  Actions: * If pain score is 4 or above: No action needed, pain <4.

## 2012-04-03 NOTE — Progress Notes (Signed)
Per chart patient had swelling of mouth, throat and difficulty breathing.  I have ordered CT scan without contrast.  She is scheduled for 04/05/12 10:00.  She is advised of the appt date and time.  She wants to do some research on a surgeon and will call me back asap with who she wants to be referred to.

## 2012-04-05 ENCOUNTER — Telehealth: Payer: Self-pay | Admitting: Gastroenterology

## 2012-04-05 ENCOUNTER — Ambulatory Visit (INDEPENDENT_AMBULATORY_CARE_PROVIDER_SITE_OTHER)
Admission: RE | Admit: 2012-04-05 | Discharge: 2012-04-05 | Disposition: A | Discharge status: Home / Self Care | Payer: Medicare Other | Source: Ambulatory Visit | Attending: Gastroenterology | Admitting: Gastroenterology

## 2012-04-05 ENCOUNTER — Other Ambulatory Visit: Payer: Self-pay

## 2012-04-05 DIAGNOSIS — K6389 Other specified diseases of intestine: Secondary | ICD-10-CM

## 2012-04-05 DIAGNOSIS — Z85038 Personal history of other malignant neoplasm of large intestine: Secondary | ICD-10-CM

## 2012-04-05 DIAGNOSIS — Z85528 Personal history of other malignant neoplasm of kidney: Secondary | ICD-10-CM

## 2012-04-05 DIAGNOSIS — Z85118 Personal history of other malignant neoplasm of bronchus and lung: Secondary | ICD-10-CM

## 2012-04-05 NOTE — Progress Notes (Signed)
Per Dr. [name] patient needs referred back to Oncology for new colon mass, history of multiple cancers in the past.

## 2012-04-05 NOTE — Telephone Encounter (Signed)
Patient aware of the results. She has set up her own surgical referral at [facility] and has an appt with Dr. Dwain Sarna on 04/23/12. She is aware she will be contacted by Oncology for appt. i sent the referral info this am ------

## 2012-04-09 ENCOUNTER — Telehealth: Payer: Self-pay | Admitting: Hematology and Oncology

## 2012-04-09 NOTE — Telephone Encounter (Signed)
S/W pt in re NP appt 9/27 @ 9:30 w/Dr. Dalene Carrow Referring Dr.

## 2012-04-09 NOTE — Telephone Encounter (Signed)
S/W pt in re NP 9/27 @ 10:30 w/ Dr. Dalene Carrow Dx- Colom NP packet mail

## 2012-04-11 ENCOUNTER — Telehealth: Payer: Self-pay | Admitting: Hematology and Oncology

## 2012-04-11 NOTE — Telephone Encounter (Signed)
C/D on 9/18 for appt 9/27 °

## 2012-04-20 ENCOUNTER — Encounter: Payer: Self-pay | Admitting: Hematology and Oncology

## 2012-04-20 ENCOUNTER — Ambulatory Visit (HOSPITAL_BASED_OUTPATIENT_CLINIC_OR_DEPARTMENT_OTHER): Payer: Medicare Other | Admitting: Hematology and Oncology

## 2012-04-20 ENCOUNTER — Ambulatory Visit: Payer: Medicare Other

## 2012-04-20 ENCOUNTER — Telehealth: Payer: Self-pay | Admitting: Hematology and Oncology

## 2012-04-20 VITALS — BP 133/85 | HR 79 | Temp 98.0°F | Resp 20 | Ht 66.0 in | Wt 264.0 lb

## 2012-04-20 DIAGNOSIS — Z85118 Personal history of other malignant neoplasm of bronchus and lung: Secondary | ICD-10-CM

## 2012-04-20 DIAGNOSIS — C3490 Malignant neoplasm of unspecified part of unspecified bronchus or lung: Secondary | ICD-10-CM

## 2012-04-20 DIAGNOSIS — Z85038 Personal history of other malignant neoplasm of large intestine: Secondary | ICD-10-CM

## 2012-04-20 DIAGNOSIS — C189 Malignant neoplasm of colon, unspecified: Secondary | ICD-10-CM

## 2012-04-20 DIAGNOSIS — K6389 Other specified diseases of intestine: Secondary | ICD-10-CM

## 2012-04-20 DIAGNOSIS — C649 Malignant neoplasm of unspecified kidney, except renal pelvis: Secondary | ICD-10-CM

## 2012-04-20 DIAGNOSIS — F17200 Nicotine dependence, unspecified, uncomplicated: Secondary | ICD-10-CM

## 2012-04-20 DIAGNOSIS — Z85528 Personal history of other malignant neoplasm of kidney: Secondary | ICD-10-CM

## 2012-04-20 DIAGNOSIS — C3491 Malignant neoplasm of unspecified part of right bronchus or lung: Secondary | ICD-10-CM | POA: Insufficient documentation

## 2012-04-20 MED ORDER — INFLUENZA VIRUS VACC SPLIT PF IM SUSP
0.5000 mL | Freq: Once | INTRAMUSCULAR | Status: DC
  Filled 2012-04-20: qty 0.5

## 2012-04-20 NOTE — Progress Notes (Signed)
This office note has been dictated.

## 2012-04-20 NOTE — Telephone Encounter (Signed)
appts made and printed for pt aom °

## 2012-04-20 NOTE — Progress Notes (Signed)
CC:   Amy [name]. Polite, M.D. Amy Gosling, MD Amy [name]. Amy Dar, MD, Amy [name]  IDENTIFYING STATEMENT:  The patient is a 66 year old woman seen at the request of Dr. Russella [name] with history of colon cancer.  HISTORY OF PRESENT ILLNESS:  The patient has a past medical history significant for renal cell carcinoma status post nephrectomy and colon cancer status post colectomy in the 1990s.  She has annual screening colonoscopies.  She has a history of polyposis and a recent colonoscopy on 04/02/2012 had shown an ulcerated sessile firm tumor or mass measuring 2 cm in the distal transverse colon.  Multiple biopsies were taken.  In addition, 2 sessile polyps were also found in the ascending colon which were also removed.  The area of concern was tattooed and the other 2 sessile polyps were removed.  Biopsy revealed fragments of adenomatous polyps but negative for high-grade dysplasia.  The patient informs me that she sees Dr. Emelia [name] early next week for surgery.  In the interim, she reports no weight loss, rectal bleeding, abdominal pain, nausea, vomiting, or change in bowel function.  Her past oncology history is very interesting in that she tells me that she had initially presented in 1999 with rectal bleeding.  A subsequent workup that has included a colonoscopy had diagnosed colon cancer.  Subsequent area staging workup had also noted a mass in the right kidney.  She underwent a right hemicolectomy in 1999 for a stage IIIB (T3 N1 M0) tumor.  She apparently received adjuvant therapy in the form of 5-FU and leucovorin for 6 months.  Dr. Brunilda [name] performed a right nephrectomy for stage II (T2 N0) renal cell carcinoma of the right kidney.  She was followed by Dr. Arlan [name] at that time.  She also reports that a subsequent CT scan of the chest had shown a nodule in the right lung. Dr. Edwyna [name] performed a right VATS with right lower lobe superior segmentectomy with pathology consistent  with stage Ia (T1 N0) non-small cell (large-cell) of the right upper lobe.  A CT scan of the abdomen and pelvis on 04/05/2012 did note right nephrectomy and anterior right base 3 mm pulmonary nodule which is similar to 2010, otherwise the CT scan was unremarkable.  PAST MEDICAL HISTORY: 1. Status post a right hemicolectomy in 1999 for colon cancer status     post 6 months of 5-FU, leucovorin. 2. Status post right nephrectomy on 04/27/1998 for a T1 N0 renal cell     carcinoma.  Adjuvant therapy not required. 3. Status post right VATS with right lower lobe superior segmentectomy     in 2001 for a stage IA non-small cell lung cancer.  No adjuvant     therapy required. 4. Status post partial hysterectomy. 5. Hypertension. 6. Diabetes. 7. History of hernia. 8. Anemia.  MEDICATIONS:  Xanax 1 mg q.h.s. as needed, vitamin C 500 mg daily, Lipitor 20 mg daily, calcium and vitamin D one tablet 2 times a day, Zetia 10 mg daily, hydrochlorothiazide 25 mg daily, Prinivil 10 mg daily, metformin 1000 mg daily, Lopressor 100 mg daily, multivitamins, Prilosec 20 mg daily, Paxil 40 mg daily, Ultram 50 mg 1-2 tablets as needed.  ALLERGIES:  Iodine.  SOCIAL HISTORY:  The patient is widowed.  Has no children, her son died years ago.  Is a current smoker, smoking 1 pack a day for 18 years. Denies alcohol use.  She is retired from [employer].  HEALTH MAINTENANCE:  Up-to-date with  mammograms and has annual colonoscopies.  REVIEW OF SYSTEMS:  Denies fever, chills, night sweats, anorexia, weight loss, pain.  GI:  Denies nausea, vomiting, abdominal pain, diarrhea, melena, hematochezia.  Cardiovascular:  Chest pain, PND, orthopnea, ankle swelling.  Respirations:  Denies cough, hemoptysis, wheeze, shortness of breath.  Skin:  No bruising or bleeding.  Musculoskeletal: Denies joint aches, muscle pains.  Neurologic:  Denies headaches, vision change, extremity weakness.   Rest of review of systems negative.  PHYSICAL EXAM:  Patient is alert and oriented x3.  Vitals:  Pulse 79, blood pressure 135/85, temperature 98, respirations 20, weight 264 pounds.  HEENT:  Head is atraumatic, normocephalic.  Sclerae anicteric. Pupils equal, round, reactive to light.  Mouth moist without ulcerations, thrush, or lesions.  Neck:  Supple.  Chest:  Clear to percussion and auscultation.  CVS:  First and second heart sounds present.  No added sounds or murmurs.  Abdomen:  Notes surgical incisions.  Soft, nontender.  No hepatosplenomegaly.  No palpable masses.  Bowel sounds present.  Extremities:  No edema.  Pulses present symmetrical.  Lymph nodes:  No palpable cervical, axillary or inguinal adenopathy.  CNS:  Nonfocal.  LABORATORY DATA:  On 04/02/2012, white cell count 6.8, hemoglobin 14.2, hematocrit 43.9, platelets 140, sodium 139, potassium 3.6, chloride 98, CO2 33, BUN 15, creatinine 1, total bilirubin 0.9, alkaline phosphatase 67, AST 22, ALT 19, calcium 9.4.  CEA 1.6.  IMPRESSION AND PLAN:  Amy [name] is a pleasant 66 year old woman with history of multiple malignancies. 1. Node-positive adenocarcinoma of sigmoid colon, T3 N1, status post 6     months of adjuvant 5-FU, leucovorin and last receiving therapy in     1999. 2. Status post right nephrectomy for stage II renal cell carcinoma.     No adjuvant therapy required. 3. Status post right VATS with right superior segmentectomy in 2001     for stage I non-small cell lung cancer.  No adjuvant therapy     required. The patient has history of polyposis and recent colonoscopy had noted a flat sessile lesion in the transverse colon and is awaiting Surgery recommendations.  She is a smoker, continues to smoke.  I have offered her smoking cessation classes but she declined.  It has been a while since she has had her chest scan and I think, in view of 3 malignancies, it would be prudent for the patient to receive a  noncontrast CT of the chest and a PET scan as she is allergic to IV media.  I also think that once she has had a surgery, a genetic referral is appropriate.  She has nieces and nephews.  She follows up after surgical consult/procedure.    ______________________________ Laurice Record, M.D. LIO/MEDQ  D:  04/20/2012  T:  04/20/2012  Job:  [id number]

## 2012-04-20 NOTE — Progress Notes (Signed)
Primary care MD: Dr. Nehemiah Settle  Nephrology: Dr. Brunilda Payor  GI: Dr. Irena Cords  GYN: Dr. Melina Modena  Mammograms yearly at [facility].  Surgeon: Dr. Dwain Sarna  Best number : [phone] or [phone] (cell)- ok to leave messages  Ok to release information to:   Penelope Galas (Brother)  Museum/gallery exhibitions officer (Sister-in-law)

## 2012-04-20 NOTE — Patient Instructions (Signed)
Amy [name]  [id number]   [facility] - AFTER VISIT SUMMARY   **RECOMMENDATIONS MADE BY THE CONSULTANT AND ANY TEST    RESULTS WILL BE SENT TO YOUR REFERRING DOCTORS.   YOUR EXAM FINDINGS, LABS AND RESULTS WERE DISCUSSED BY YOUR MD TODAY.  YOU CAN GO TO THE  WEB SITE FOR INSTRUCTIONS ON HOW TO ASSESS MY CHART FOR ADDITIONAL INFORMATION AS NEEDED.  Your Updated drug allergies are: Allergies as of 04/20/2012 - Review Complete 04/02/2012  Allergen Reaction Noted  . Iohexol  01/09/2009    Your current list of medications are: Current Outpatient Prescriptions  Medication Sig Dispense Refill  . ALPRAZolam (XANAX) 1 MG tablet Take 1 mg by mouth at bedtime as needed. sleep       . Ascorbic Acid (VITAMIN C WITH ROSE HIPS) 500 MG tablet Take 500 mg by mouth daily.        Marland Kitchen atorvastatin (LIPITOR) 20 MG tablet       . Calcium Carbonate-Vitamin D (CALCIUM-VITAMIN D) 500-200 MG-UNIT per tablet Take 1 tablet by mouth 2 (two) times daily with a meal.        . ezetimibe (ZETIA) 10 MG tablet Take 10 mg by mouth daily.        . Ferrous Gluconate (IRON) 240 (27 FE) MG TABS Take 1 tablet by mouth daily.        . hydrochlorothiazide 25 MG tablet Take 1 tablet by mouth Daily.      Marland Kitchen lisinopril (PRINIVIL,ZESTRIL) 10 MG tablet       . metFORMIN (GLUCOPHAGE) 500 MG tablet       . metoprolol (LOPRESSOR) 100 MG tablet Take 1 tablet by mouth Daily.      . Multiple Vitamins-Minerals (CENTRUM SILVER PO) Take 1 tablet by mouth daily.        Marland Kitchen omeprazole (PRILOSEC) 20 MG capsule Take 20 mg by mouth daily.        Marland Kitchen PARoxetine (PAXIL) 40 MG tablet Take 1 tablet by mouth Daily.      . traMADol (ULTRAM) 50 MG tablet Take 1 tablet by mouth as needed. Take 1-2 for pain as needed       Current Facility-Administered Medications  Medication Dose Route Frequency Provider Last Rate Last Dose  . influenza  inactive virus vaccine (FLUZONE/FLUARIX) injection 0.5 mL  0.5 mL Intramuscular Once  Laurice Record, MD         INSTRUCTIONS GIVEN AND DISCUSSED:  See attached schedule   SPECIAL INSTRUCTIONS/FOLLOW-UP:  See above.  I acknowledge that I have been informed and understand all the instructions given to me and received a copy.I know to contact the clinic, my physician, or go to the emergency Department if any problems should occur.   I do not have any more questions at this time, but understand that I may call the [facility] at [phone] during business hours should I have any further questions or need assistance in obtaining follow-up care.

## 2012-04-23 ENCOUNTER — Ambulatory Visit (INDEPENDENT_AMBULATORY_CARE_PROVIDER_SITE_OTHER): Payer: Medicare Other | Admitting: General Surgery

## 2012-04-23 ENCOUNTER — Encounter (INDEPENDENT_AMBULATORY_CARE_PROVIDER_SITE_OTHER): Payer: Self-pay | Admitting: General Surgery

## 2012-04-23 VITALS — BP 122/84 | HR 72 | Temp 97.2°F | Resp 24 | Ht 66.0 in | Wt 258.4 lb

## 2012-04-23 DIAGNOSIS — D126 Benign neoplasm of colon, unspecified: Secondary | ICD-10-CM

## 2012-04-23 NOTE — Progress Notes (Signed)
Patient ID: Amy [name], female   DOB: 1945/08/23, 66 y.o.   MRN: [id number]  Chief Complaint  Patient presents with  . Colon Polyps    new pt    HPI Amy [name] is a 66 y.o. female.  Referred by Dr. Bosie Clos HPI This is a 66 year old female who has a history of a stage IIIB colon cancer treated with a right hemicolectomy in 1999. She also at the same time was treated with a radical right nephrectomy for a stage II renal cell carcinoma. Subsequent to that she has also undergone a right thoracotomy with a right lower lobe superior segmentectomy for a stage IA non-small cell lung cancer. She's done very well since then. She returns today after undergoing a routine screening colonoscopy. She had no symptoms prior to this colonoscopy. On her colonoscopy she had 2 sessile polyps ranging between 5-6 mm in size in the proximal transverse colon. Polypectomies were performed and these are benign. An  ulcerated sessile firm tumor mass measuring 2 cm was seen in the distal transverse colon. Multiple biopsies were obtained and are not definitive. Tattoos were placed 5 cm proximal and 5 cm distal to the tumor. There is evidence of a prior ileocolonic surgical anastomosis with a right hemicolectomy. The remainder of the colon was normal. She comes in today to discuss her options. She is ready been seen by medical oncology and is due to get a PET scan next week. She does continue to smoke and I offered her smoking cessation information today.  This will increase her surgical risks also.  Past Medical History  Diagnosis Date  . Depression   . Diabetes mellitus     diet controlled  . Anemia   . Anxiety   . Arthritis   . Cancer     colon,renal,lung  . Hypertension   . Hernia     Near umbilicus  . Hemorrhoids   . Heart murmur     Past Surgical History  Procedure Date  . Colonoscopy   . Polypectomy   . Lung lobectomy     right lower  . Colon surgery     chemo  . Kidney surgery 1999   right/chemo kidney removed for cancer  . Partial hysterectomy   . Breast biopsy     Bilateral cysts/benign    Family History  Problem Relation Age of Onset  . Colon polyps Brother   . Breast cancer Mother   . Cancer Mother     breast  . Heart disease Father   . Colon cancer Neg Hx   . Esophageal cancer Neg Hx   . Rectal cancer Neg Hx   . Stomach cancer Neg Hx     Social History History  Substance Use Topics  . Smoking status: Current Every Day Smoker -- 1.0 packs/day for 18 years    Types: Cigarettes  . Smokeless tobacco: Never Used  . Alcohol Use: No    Allergies  Allergen Reactions  . Iodine Anaphylaxis  . Iohexol      Code: HIVES, Desc: pt states , swelling mouth , throat, and has difficulty breathing     Current Outpatient Prescriptions  Medication Sig Dispense Refill  . ALPRAZolam (XANAX) 1 MG tablet Take 1 mg by mouth at bedtime as needed. sleep       . Ascorbic Acid (VITAMIN C WITH ROSE HIPS) 500 MG tablet Take 500 mg by mouth daily.        Marland Kitchen atorvastatin (LIPITOR) 20 MG  tablet 20 mg daily.       . Calcium Carbonate-Vitamin D (CALCIUM-VITAMIN D) 500-200 MG-UNIT per tablet Take 1 tablet by mouth 2 (two) times daily with a meal.        . Ferrous Gluconate (IRON) 240 (27 FE) MG TABS Take 1 tablet by mouth daily.        . hydrochlorothiazide 25 MG tablet Take 1 tablet by mouth Daily.      Marland Kitchen lisinopril (PRINIVIL,ZESTRIL) 10 MG tablet Take 10 mg by mouth daily.       . metFORMIN (GLUCOPHAGE) 500 MG tablet 500 mg 2 (two) times daily with a meal.       . metoprolol (LOPRESSOR) 100 MG tablet Take 1 tablet by mouth Daily.      . Multiple Vitamins-Minerals (CENTRUM SILVER PO) Take 1 tablet by mouth daily.        Marland Kitchen omeprazole (PRILOSEC) 20 MG capsule Take 20 mg by mouth daily.        Marland Kitchen PARoxetine (PAXIL) 40 MG tablet Take 1 tablet by mouth Daily.      . traMADol (ULTRAM) 50 MG tablet Take 1 tablet by mouth as needed. Take 1-2 for pain as needed        Review of  Systems Review of Systems  Constitutional: Negative for fever, chills and unexpected weight change.  HENT: Negative for hearing loss, congestion, sore throat, trouble swallowing and voice change.   Eyes: Negative for visual disturbance.  Respiratory: Negative for cough and wheezing.   Cardiovascular: Negative for chest pain, palpitations and leg swelling.  Gastrointestinal: Positive for constipation. Negative for nausea, vomiting, abdominal pain, diarrhea, blood in stool, abdominal distention and anal bleeding.  Genitourinary: Negative for hematuria, vaginal bleeding and difficulty urinating.  Musculoskeletal: Positive for arthralgias.  Skin: Negative for rash and wound.  Neurological: Negative for seizures, syncope and headaches.  Hematological: Negative for adenopathy. Does not bruise/bleed easily.  Psychiatric/Behavioral: Negative for confusion.    Blood pressure 122/84, pulse 72, temperature 97.2 F (36.2 C), temperature source Temporal, resp. rate 24, height 5\' 6"  (1.676 m), weight 258 lb 6.4 oz (117.209 kg).  Physical Exam Physical Exam  Vitals reviewed. Constitutional: She appears well-developed and well-nourished.  Neck: Neck supple.  Cardiovascular: Normal rate, regular rhythm and normal heart sounds.   Pulmonary/Chest: Effort normal and breath sounds normal. She has no wheezes. She has no rales.  Abdominal: Soft. Normal appearance and bowel sounds are normal. There is no tenderness. A hernia is present. Hernia confirmed positive in the ventral area.    Lymphadenopathy:    She has no cervical adenopathy.    Data Reviewed CT ABDOMEN AND PELVIS WITHOUT CONTRAST  Technique: Multidetector CT imaging of the abdomen and pelvis was  performed following the standard protocol without intravenous  contrast.  Comparison: Abdominal CT 01/09/2009. Abdominal pelvic CT of  09/22/2006.  Findings: Anterior right lung base nodule on image one measures 3  mm and is likely similar to on  01/09/2009.  Scarring at the right lung base and lingula.  Normal heart size without pericardial or pleural effusion. Normal  uninfused appearance of the liver, spleen. Underdistended stomach.  Partially fatty replaced pancreas. Normal gallbladder, biliary  tract, adrenal glands. Status post right nephrectomy. Normal left  kidney.  No retroperitoneal or retrocrural adenopathy.  The transverse colon is positioned partially within an area of  ventral abdominal wall laxity. No dominant colonic mass is seen.  Status post partial right hemicolectomy. Normal small bowel  caliber. No  ascites. No evidence of omental or peritoneal disease.  No pelvic adenopathy. Normal urinary bladder. Hysterectomy. No  adnexal mass or significant free fluid. Sclerosis involving the  left greater right sacroiliac joints which is likely degenerative.  A subtle lucent lesion in the right iliac bone is unchanged since  2010, suggesting benignity.  IMPRESSION:  1. No evidence of metastatic disease. No primary colonic mass  identified.  2. Decreased sensitivity secondary to unenhanced technique. If  there is high clinical concern of liver metastasis, pre and post  contrast abdominal MRI should be considered.  3. Transverse colon positioned within an area of ventral abdominal  wall laxity; no obstruction.  4. Status post right nephrectomy.   Assessment    Transverse colon polyp History of colon, renal and lung cancer    Plan    We had  a long discussion today and I showed her pictures of where this area was. This area it does not appear to be resectable endoscopically. A PET scan is reasonable given her history but if she does not have any other disease then the treatment of this area would be surgical. This is going to be complicated by her prior operation, presence of a ventral hernia, and her smoking history. I discussed with her an attempt at a laparoscopic partial colectomy. The remainder of her colon does  not have polyps and I don't feel any compelling need to remove anymore than what is necessary to remove this cancer. I discussed with her a partial colectomy with a likely small bowel to a descending colon anastomosis. We discussed there is a good chance this will be done in an open fashion I will attempt to do this with the laparoscope. I will plan on getting in touch with her after she gets her PET scan. We discussed the surgery as well as the hospital stay being up to 7-10 days. We discussed her postoperative restrictions as well as the idea that she will need some help after this operation. We discussed that this likely would change her bowel movements and make them a little bit more frequent and may be more loose over the long term as well. We discussed complete recovery would take  likely several months. Any other treatment would be based on what the pathology of this lesion is as well as what her lymph node status would be if this indeed ends up being a colon cancer. We discussed the operative risks being, but not limited to, bleeding, infection, abscess requiring drainage, anastomotic leak requiring reoperation and a possible ostomy if that is the case, pneumonia, urinary tract infection, wound infection, open wound, DVT, PE, arrhythmia, myocardial infarction. These are all increased by her smoking history. These are also increased by her diabetes and her body habitus. We also discussed that I would possibly primarily repair her ventral hernia but this would come back over some time.       Marlisa Caridi 04/23/2012, 4:13 PM

## 2012-04-24 ENCOUNTER — Telehealth (INDEPENDENT_AMBULATORY_CARE_PROVIDER_SITE_OTHER): Payer: Self-pay | Admitting: General Surgery

## 2012-04-24 NOTE — Telephone Encounter (Signed)
Spoke with pt and informed her that I called Dr. Idelle Crouch office and tried to move up appt with Dr. Nehemiah Settle for medical clearance.  They got her an appt scheduled for 10/2 at 11:30.  Informed the pt that this got scheduled and she informed me that she will not have a copay ready.  I informed her that she should call their office and speak with their financial representative to see if they are willing to work with her on this.  She said she would and that if they cant she will see if they can reschedule it to 10/3 for when she gets paid.

## 2012-04-24 NOTE — Telephone Encounter (Signed)
Called patient to let her know that before we can get medical clearance for her surgery that she needs to contact Dr.Polite's office [phone] to set up an appt per his request..Amy KitchenLMOM for patient to contact [facility] in regards to this.Amy KitchenMarland [name]

## 2012-05-01 ENCOUNTER — Ambulatory Visit (HOSPITAL_COMMUNITY)
Admission: RE | Admit: 2012-05-01 | Discharge: 2012-05-01 | Disposition: A | Discharge status: Home / Self Care | Payer: Medicare Other | Source: Ambulatory Visit | Attending: Hematology and Oncology | Admitting: Hematology and Oncology

## 2012-05-01 ENCOUNTER — Telehealth: Payer: Self-pay | Admitting: Nurse Practitioner

## 2012-05-01 DIAGNOSIS — C189 Malignant neoplasm of colon, unspecified: Secondary | ICD-10-CM

## 2012-05-01 DIAGNOSIS — Z85528 Personal history of other malignant neoplasm of kidney: Secondary | ICD-10-CM | POA: Insufficient documentation

## 2012-05-01 DIAGNOSIS — Z85038 Personal history of other malignant neoplasm of large intestine: Secondary | ICD-10-CM | POA: Insufficient documentation

## 2012-05-01 DIAGNOSIS — Z85118 Personal history of other malignant neoplasm of bronchus and lung: Secondary | ICD-10-CM | POA: Insufficient documentation

## 2012-05-01 DIAGNOSIS — C649 Malignant neoplasm of unspecified kidney, except renal pelvis: Secondary | ICD-10-CM

## 2012-05-01 DIAGNOSIS — C3490 Malignant neoplasm of unspecified part of unspecified bronchus or lung: Secondary | ICD-10-CM

## 2012-05-01 DIAGNOSIS — K639 Disease of intestine, unspecified: Secondary | ICD-10-CM | POA: Insufficient documentation

## 2012-05-01 LAB — GLUCOSE, CAPILLARY: Glucose-Capillary: 158 mg/dL — ABNORMAL HIGH (ref 70–99)

## 2012-05-01 MED ORDER — FLUDEOXYGLUCOSE F - 18 (FDG) INJECTION
14.5000 | Freq: Once | INTRAVENOUS | Status: AC | PRN
  Administered 2012-05-01: 14.5 via INTRAVENOUS

## 2012-05-01 NOTE — Telephone Encounter (Signed)
Informed pt per Dr. Lonell Face note- PET scan ok.

## 2012-05-01 NOTE — Telephone Encounter (Signed)
Message copied by Barbara Cower on Tue May 01, 2012  2:53 PM ------      Message from: Arlan Organ I      Created: Tue May 01, 2012  1:37 PM       Call pt - PET scan [facility]

## 2012-05-08 ENCOUNTER — Encounter (INDEPENDENT_AMBULATORY_CARE_PROVIDER_SITE_OTHER): Payer: Self-pay | Admitting: General Surgery

## 2012-05-08 ENCOUNTER — Encounter (INDEPENDENT_AMBULATORY_CARE_PROVIDER_SITE_OTHER): Payer: Self-pay

## 2012-05-08 ENCOUNTER — Ambulatory Visit (INDEPENDENT_AMBULATORY_CARE_PROVIDER_SITE_OTHER): Payer: Medicare Other | Admitting: General Surgery

## 2012-05-08 VITALS — BP 166/108 | HR 76 | Temp 97.8°F | Resp 20 | Ht 66.0 in | Wt 263.6 lb

## 2012-05-08 DIAGNOSIS — D126 Benign neoplasm of colon, unspecified: Secondary | ICD-10-CM

## 2012-05-08 NOTE — Progress Notes (Signed)
Subjective:     Patient ID: Amy [name], female   DOB: [birth year], 66 y.o.   MRN: [id number]  HPI This is a 66 year old female with a history of stage IIIB colon cancer treated with a right hemicolectomy in 1999 who underwent a radical right nephrectomy for a stage II renal cancer at the same time. She subsequently had a stage IA non small cell lung cancer treated with a right thoracotomy and a right lower lobe superior segmentectomy. I saw her and she had an ulcerated sessile firm mass measuring 2 cm in the distal transverse colon at the biopsies were not definitive. She underwent a PET scan prior to this visit which shows her to have no hypermetabolic foci for many the patient's primary and likely some physiologic hypermetabolism about the left adrenal gland and sigmoid colon. She comes in today to discuss the next step which would be surgery.  Review of Systems NUCLEAR MEDICINE PET SKULL BASE TO THIGH  Fasting Blood Glucose: 158  Technique: 14.5 mCi F-18 FDG was injected intravenously. CT data  was obtained and used for attenuation correction and anatomic  localization only. (This was not acquired as a diagnostic CT  examination.) Additional exam technical data entered on  technologist worksheet.  Comparison: Today's chest CT, dictated separately. Abdominal  pelvic CT of 04/05/2012.  Findings:  Neck: No abnormal hypermetabolism.  Chest: No abnormal hypermetabolism.  Abdomen/Pelvis: Mild hypermetabolism about the inferior aspect of  the left adrenal gland. This is inferior to mild nodularity,  identified on image 129. Favored to be physiologic.  Multifocal moderate hypermetabolism involving the sigmoid colon,  without dominant mass on CT.  Skeleton: No abnormal marrow/bone hypermetabolism.  CT images performed for attenuation correction demonstrate no  significant findings within the neck. Chest, abdomen, and pelvic  findings deferred to today's chest CT and the abdominal pelvic CT  of  04/05/2012. No evidence of acute superimposed process within  the abdomen/pelvis. Possible constipation. Hysterectomy. Right  nephrectomy. Degenerative sclerosis of the left sacroiliac joint.  Exam is mildly degraded secondary patient body habitus.  IMPRESSION:  1. No hypermetabolic foci from any of the patient's primaries.  2. Likely physiologic hypermetabolism about the left adrenal gland  and sigmoid colon.  3. Mild degradation secondary patient body habitus.     Objective:   Physical Exam Deferred today    Assessment:     Transverse colon mass    Plan:     We discussed results of PET scan today.  It is negative and we will proceed with surgery now.  I discussed this with her today. Please see my prior note for this conversation which we went over today again.

## 2012-05-17 ENCOUNTER — Encounter (HOSPITAL_COMMUNITY): Payer: Self-pay | Admitting: Pharmacy Technician

## 2012-05-23 NOTE — Progress Notes (Signed)
Epic see CXR 04-20-12 and EKG from Dr. Deboraha Sprang [facility] at [facility] 04-25-2012

## 2012-05-24 ENCOUNTER — Encounter (HOSPITAL_COMMUNITY): Payer: Self-pay

## 2012-05-24 ENCOUNTER — Encounter (HOSPITAL_COMMUNITY)
Admission: RE | Admit: 2012-05-24 | Discharge: 2012-05-24 | Disposition: A | Discharge status: Home / Self Care | Payer: Medicare Other | Source: Ambulatory Visit | Attending: General Surgery | Admitting: General Surgery

## 2012-05-24 LAB — CBC WITH DIFFERENTIAL/PLATELET
Basophils Absolute: 0 10*3/uL (ref 0.0–0.1)
Eosinophils Relative: 1 % (ref 0–5)
HCT: 40.6 % (ref 36.0–46.0)
Lymphocytes Relative: 31 % (ref 12–46)
MCV: 86.2 fL (ref 78.0–100.0)
Monocytes Absolute: 0.3 10*3/uL (ref 0.1–1.0)
RDW: 15.1 % (ref 11.5–15.5)
WBC: 6 10*3/uL (ref 4.0–10.5)

## 2012-05-24 LAB — BASIC METABOLIC PANEL
BUN: 14 mg/dL (ref 6–23)
CO2: 36 mEq/L — ABNORMAL HIGH (ref 19–32)
Chloride: 98 mEq/L (ref 96–112)
Creatinine, Ser: 0.85 mg/dL (ref 0.50–1.10)

## 2012-05-24 LAB — SURGICAL PCR SCREEN: MRSA, PCR: NEGATIVE

## 2012-05-24 NOTE — Pre-Procedure Instructions (Signed)
20 Amy [name]  05/24/2012   Your procedure is scheduled on:  Thursday 05-31-2012  Report to [facility] at  AM.  1130  Call this number if you have problems the morning of surgery: [phone]   Remember: Follow bowel prep from Dr. Doreen Salvage office to prepare for your surgery   Do not drink liquids or  eat food:After Midnight Wednesday night      Take these medicines the morning of surgery with A SIP OF WATER:    Do not wear jewelry, make-up or nail polish.  Do not wear lotions, powders, or perfumes. You may wear deodorant.  Do not shave 48 hours prior to surgery. Men may shave face and neck.  Do not bring valuables to the hospital.  Contacts, dentures or bridgework may not be worn into surgery.  Leave suitcase in the car. After surgery it may be brought to your room.  For patients admitted to the hospital, checkout time is 11:00 AM the day of discharge.   Patients discharged the day of surgery will not be allowed to drive home.  Name and phone number of your driver:   See [facility] Preparing for surgery sheet.   Please read over the following fact sheets that you were given: MRSA Information

## 2012-05-24 NOTE — Pre-Procedure Instructions (Signed)
20 Amy [name]  05/24/2012   Your procedure is scheduled on:  Thursday 05-31-2012  Report to [facility] at  AM. 1130  Call this number if you have problems the morning of surgery: [phone]   Remember: Do one day bowel prep per Dr. Doreen Salvage instructions  Wednesday   Do not drink liquids or  eat food:After Midnight Wednesday night      Take these medicines the morning of surgery with A SIP OF WATER:  Metoprolol,Paxil and Priolsec   Do not wear jewelry, make-up or nail polish.  Do not wear lotions, powders, or perfumes. You may wear deodorant.  Do not shave 48 hours prior to surgery. Men may shave face and neck.  Do not bring valuables to the hospital.  Contacts, dentures or bridgework may not be worn into surgery.  Leave suitcase in the car. After surgery it may be brought to your room.  For patients admitted to the hospital, checkout time is 11:00 AM the day of discharge.   Patients discharged the day of surgery will not be allowed to drive home.  Name and phone number of your driver:  Zollie Beckers brother [phone]  See [facility] Preparing for surgery sheet.   Please read over the following fact sheets that you were given: MRSA Information,Blood transfusion

## 2012-05-25 ENCOUNTER — Encounter (HOSPITAL_COMMUNITY): Payer: Self-pay

## 2012-05-31 ENCOUNTER — Encounter (HOSPITAL_COMMUNITY): Payer: Self-pay | Admitting: *Deleted

## 2012-05-31 ENCOUNTER — Ambulatory Visit (HOSPITAL_COMMUNITY): Payer: Medicare Other | Admitting: Anesthesiology

## 2012-05-31 ENCOUNTER — Encounter (HOSPITAL_COMMUNITY): Payer: Self-pay | Admitting: Anesthesiology

## 2012-05-31 ENCOUNTER — Encounter (HOSPITAL_COMMUNITY)
Admission: RE | Disposition: A | Discharge status: Home Health | Payer: Self-pay | Source: Ambulatory Visit | Attending: General Surgery

## 2012-05-31 ENCOUNTER — Inpatient Hospital Stay (HOSPITAL_COMMUNITY)
Admission: RE | Admit: 2012-05-31 | Discharge: 2012-06-07 | DRG: 330 | Disposition: A | Discharge status: Home Health | Payer: Medicare Other | Source: Ambulatory Visit | Attending: General Surgery | Admitting: General Surgery

## 2012-05-31 DIAGNOSIS — Z9049 Acquired absence of other specified parts of digestive tract: Secondary | ICD-10-CM

## 2012-05-31 DIAGNOSIS — K560 Paralytic ileus: Secondary | ICD-10-CM | POA: Diagnosis not present

## 2012-05-31 DIAGNOSIS — Z01812 Encounter for preprocedural laboratory examination: Secondary | ICD-10-CM

## 2012-05-31 DIAGNOSIS — E669 Obesity, unspecified: Secondary | ICD-10-CM | POA: Diagnosis present

## 2012-05-31 DIAGNOSIS — E876 Hypokalemia: Secondary | ICD-10-CM | POA: Diagnosis not present

## 2012-05-31 DIAGNOSIS — K660 Peritoneal adhesions (postprocedural) (postinfection): Secondary | ICD-10-CM | POA: Diagnosis present

## 2012-05-31 DIAGNOSIS — Z85528 Personal history of other malignant neoplasm of kidney: Secondary | ICD-10-CM

## 2012-05-31 DIAGNOSIS — C189 Malignant neoplasm of colon, unspecified: Secondary | ICD-10-CM

## 2012-05-31 DIAGNOSIS — E119 Type 2 diabetes mellitus without complications: Secondary | ICD-10-CM | POA: Diagnosis present

## 2012-05-31 DIAGNOSIS — K430 Incisional hernia with obstruction, without gangrene: Secondary | ICD-10-CM | POA: Diagnosis present

## 2012-05-31 DIAGNOSIS — D126 Benign neoplasm of colon, unspecified: Secondary | ICD-10-CM | POA: Diagnosis present

## 2012-05-31 DIAGNOSIS — D696 Thrombocytopenia, unspecified: Secondary | ICD-10-CM | POA: Diagnosis not present

## 2012-05-31 DIAGNOSIS — K6389 Other specified diseases of intestine: Secondary | ICD-10-CM | POA: Diagnosis present

## 2012-05-31 DIAGNOSIS — K432 Incisional hernia without obstruction or gangrene: Secondary | ICD-10-CM | POA: Diagnosis present

## 2012-05-31 DIAGNOSIS — F17200 Nicotine dependence, unspecified, uncomplicated: Secondary | ICD-10-CM | POA: Diagnosis present

## 2012-05-31 DIAGNOSIS — Z905 Acquired absence of kidney: Secondary | ICD-10-CM

## 2012-05-31 DIAGNOSIS — Z85038 Personal history of other malignant neoplasm of large intestine: Secondary | ICD-10-CM

## 2012-05-31 DIAGNOSIS — K639 Disease of intestine, unspecified: Principal | ICD-10-CM | POA: Diagnosis present

## 2012-05-31 DIAGNOSIS — Z6841 Body Mass Index (BMI) 40.0 and over, adult: Secondary | ICD-10-CM

## 2012-05-31 HISTORY — PX: PARTIAL COLECTOMY: SHX5273

## 2012-05-31 HISTORY — PX: COLECTOMY: SHX59

## 2012-05-31 LAB — CBC
HCT: 40.1 % (ref 36.0–46.0)
Hemoglobin: 13.4 g/dL (ref 12.0–15.0)
MCH: 28.8 pg (ref 26.0–34.0)
MCHC: 33.4 g/dL (ref 30.0–36.0)
MCV: 86.2 fL (ref 78.0–100.0)
Platelets: 151 K/uL (ref 150–400)
RBC: 4.65 MIL/uL (ref 3.87–5.11)
RDW: 15.3 % (ref 11.5–15.5)
WBC: 10.7 K/uL — ABNORMAL HIGH (ref 4.0–10.5)

## 2012-05-31 LAB — CREATININE, SERUM
Creatinine, Ser: 0.93 mg/dL (ref 0.50–1.10)
GFR calc Af Amer: 73 mL/min — ABNORMAL LOW
GFR calc non Af Amer: 63 mL/min — ABNORMAL LOW

## 2012-05-31 LAB — GLUCOSE, CAPILLARY
Glucose-Capillary: 150 mg/dL — ABNORMAL HIGH (ref 70–99)
Glucose-Capillary: 138 mg/dL — ABNORMAL HIGH (ref 70–99)

## 2012-05-31 SURGERY — COLECTOMY, PARTIAL
Anesthesia: General | Site: Abdomen | Wound class: Clean Contaminated

## 2012-05-31 MED ORDER — SODIUM CHLORIDE 0.9 % IV SOLN
INTRAVENOUS | Status: DC
  Administered 2012-05-31: 1000 mL via INTRAVENOUS
  Administered 2012-05-31 – 2012-06-02 (×5): via INTRAVENOUS
  Administered 2012-06-02: 125 mL/h via INTRAVENOUS
  Administered 2012-06-03: 06:00:00 via INTRAVENOUS

## 2012-05-31 MED ORDER — SODIUM CHLORIDE 0.9 % IV SOLN
INTRAVENOUS | Status: AC
  Filled 2012-05-31: qty 1

## 2012-05-31 MED ORDER — ACETAMINOPHEN 10 MG/ML IV SOLN
INTRAVENOUS | Status: AC
  Filled 2012-05-31: qty 100

## 2012-05-31 MED ORDER — PHENYLEPHRINE HCL 10 MG/ML IJ SOLN
INTRAMUSCULAR | Status: DC | PRN
  Administered 2012-05-31 (×2): 80 ug via INTRAVENOUS
  Administered 2012-05-31: 160 ug via INTRAVENOUS

## 2012-05-31 MED ORDER — ACETAMINOPHEN 10 MG/ML IV SOLN
INTRAVENOUS | Status: DC | PRN
  Administered 2012-05-31: 1000 mg via INTRAVENOUS

## 2012-05-31 MED ORDER — MORPHINE SULFATE (PF) 1 MG/ML IV SOLN
INTRAVENOUS | Status: DC
  Administered 2012-05-31: 1 mg via INTRAVENOUS
  Administered 2012-06-01: 2 mg via INTRAVENOUS
  Administered 2012-06-01: 1 mg via INTRAVENOUS
  Administered 2012-06-01: 2 mg via INTRAVENOUS
  Administered 2012-06-02: 1 mg via INTRAVENOUS
  Administered 2012-06-02: 3 mg via INTRAVENOUS
  Administered 2012-06-02: 2 mg via INTRAVENOUS
  Administered 2012-06-02 (×2): 1 mg via INTRAVENOUS
  Administered 2012-06-03: 3 mg via INTRAVENOUS
  Administered 2012-06-03 (×2): 1 mg via INTRAVENOUS
  Administered 2012-06-03: 2 mg via INTRAVENOUS
  Administered 2012-06-03: 1 mg via INTRAVENOUS
  Administered 2012-06-03: 11:00:00 via INTRAVENOUS
  Administered 2012-06-04: 2 mg via INTRAVENOUS
  Administered 2012-06-04 – 2012-06-05 (×4): 1 mg via INTRAVENOUS
  Filled 2012-05-31: qty 25

## 2012-05-31 MED ORDER — FENTANYL CITRATE 0.05 MG/ML IJ SOLN
INTRAMUSCULAR | Status: DC | PRN
  Administered 2012-05-31 (×5): 50 ug via INTRAVENOUS

## 2012-05-31 MED ORDER — DIPHENHYDRAMINE HCL 12.5 MG/5ML PO ELIX: 12.5000 mg | ORAL_SOLUTION | Freq: Four times a day (QID) | ORAL | Status: DC | PRN

## 2012-05-31 MED ORDER — LACTATED RINGERS IV SOLN
INTRAVENOUS | Status: DC
  Administered 2012-05-31 (×2): via INTRAVENOUS

## 2012-05-31 MED ORDER — INSULIN ASPART 100 UNIT/ML ~~LOC~~ SOLN
0.0000 [IU] | Freq: Three times a day (TID) | SUBCUTANEOUS | Status: DC
  Administered 2012-05-31 – 2012-06-07 (×8): 3 [IU] via SUBCUTANEOUS
  Administered 2012-06-07: 2 [IU] via SUBCUTANEOUS

## 2012-05-31 MED ORDER — ONDANSETRON HCL 4 MG/2ML IJ SOLN: 4.0000 mg | Freq: Four times a day (QID) | INTRAMUSCULAR | Status: DC | PRN

## 2012-05-31 MED ORDER — METOPROLOL TARTRATE 1 MG/ML IV SOLN
5.0000 mg | Freq: Four times a day (QID) | INTRAVENOUS | Status: DC
  Administered 2012-05-31 – 2012-06-05 (×19): 5 mg via INTRAVENOUS
  Filled 2012-05-31 (×23): qty 5

## 2012-05-31 MED ORDER — NALOXONE HCL 0.4 MG/ML IJ SOLN
INTRAMUSCULAR | Status: DC | PRN
  Administered 2012-05-31 (×2): .1 mg via INTRAVENOUS

## 2012-05-31 MED ORDER — BUPIVACAINE-EPINEPHRINE 0.25% -1:200000 IJ SOLN
INTRAMUSCULAR | Status: AC
  Filled 2012-05-31: qty 1

## 2012-05-31 MED ORDER — ONDANSETRON HCL 4 MG/2ML IJ SOLN
INTRAMUSCULAR | Status: DC | PRN
  Administered 2012-05-31: 4 mg via INTRAVENOUS

## 2012-05-31 MED ORDER — 0.9 % SODIUM CHLORIDE (POUR BTL) OPTIME
TOPICAL | Status: DC | PRN
  Administered 2012-05-31 (×2): 1000 mL

## 2012-05-31 MED ORDER — PROPOFOL 10 MG/ML IV BOLUS
INTRAVENOUS | Status: DC | PRN
  Administered 2012-05-31: 150 mg via INTRAVENOUS

## 2012-05-31 MED ORDER — BIOTENE DRY MOUTH MT LIQD
15.0000 mL | Freq: Two times a day (BID) | OROMUCOSAL | Status: DC
  Administered 2012-05-31: 15 mL via OROMUCOSAL

## 2012-05-31 MED ORDER — LIDOCAINE HCL (CARDIAC) 20 MG/ML IV SOLN
INTRAVENOUS | Status: DC | PRN
  Administered 2012-05-31: 100 mg via INTRAVENOUS

## 2012-05-31 MED ORDER — NEOSTIGMINE METHYLSULFATE 1 MG/ML IJ SOLN
INTRAMUSCULAR | Status: DC | PRN
  Administered 2012-05-31: 5 mg via INTRAVENOUS

## 2012-05-31 MED ORDER — SODIUM CHLORIDE 0.9 % IJ SOLN: 9.0000 mL | INTRAMUSCULAR | Status: DC | PRN

## 2012-05-31 MED ORDER — GLYCOPYRROLATE 0.2 MG/ML IJ SOLN
INTRAMUSCULAR | Status: DC | PRN
  Administered 2012-05-31: .8 mg via INTRAVENOUS

## 2012-05-31 MED ORDER — HEPARIN SODIUM (PORCINE) 5000 UNIT/ML IJ SOLN
5000.0000 [IU] | Freq: Three times a day (TID) | INTRAMUSCULAR | Status: DC
  Administered 2012-06-01 – 2012-06-07 (×19): 5000 [IU] via SUBCUTANEOUS
  Filled 2012-05-31 (×22): qty 1

## 2012-05-31 MED ORDER — ACETAMINOPHEN 650 MG RE SUPP: 650.0000 mg | Freq: Four times a day (QID) | RECTAL | Status: DC | PRN

## 2012-05-31 MED ORDER — LACTATED RINGERS IV SOLN: INTRAVENOUS | Status: DC

## 2012-05-31 MED ORDER — ERTAPENEM SODIUM 1 G IJ SOLR
1.0000 g | INTRAMUSCULAR | Status: AC
  Administered 2012-05-31 (×2): 1 g via INTRAVENOUS
  Filled 2012-05-31: qty 1

## 2012-05-31 MED ORDER — ROCURONIUM BROMIDE 100 MG/10ML IV SOLN
INTRAVENOUS | Status: DC | PRN
  Administered 2012-05-31: 40 mg via INTRAVENOUS
  Administered 2012-05-31 (×2): 10 mg via INTRAVENOUS

## 2012-05-31 MED ORDER — NALOXONE HCL 0.4 MG/ML IJ SOLN: 0.4000 mg | INTRAMUSCULAR | Status: DC | PRN

## 2012-05-31 MED ORDER — ALVIMOPAN 12 MG PO CAPS: 12.0000 mg | ORAL_CAPSULE | Freq: Two times a day (BID) | ORAL | Status: DC

## 2012-05-31 MED ORDER — PANTOPRAZOLE SODIUM 40 MG IV SOLR
40.0000 mg | Freq: Every day | INTRAVENOUS | Status: DC
  Administered 2012-05-31 – 2012-06-05 (×6): 40 mg via INTRAVENOUS
  Filled 2012-05-31 (×7): qty 40

## 2012-05-31 MED ORDER — EPHEDRINE SULFATE 50 MG/ML IJ SOLN
INTRAMUSCULAR | Status: DC | PRN
  Administered 2012-05-31: 5 mg via INTRAVENOUS
  Administered 2012-05-31: 10 mg via INTRAVENOUS

## 2012-05-31 MED ORDER — MORPHINE SULFATE (PF) 1 MG/ML IV SOLN
INTRAVENOUS | Status: AC
  Filled 2012-05-31: qty 25

## 2012-05-31 MED ORDER — DIPHENHYDRAMINE HCL 50 MG/ML IJ SOLN: 12.5000 mg | Freq: Four times a day (QID) | INTRAMUSCULAR | Status: DC | PRN

## 2012-05-31 MED ORDER — ACETAMINOPHEN 325 MG PO TABS: 650.0000 mg | ORAL_TABLET | Freq: Four times a day (QID) | ORAL | Status: DC | PRN

## 2012-05-31 MED ORDER — ALBUTEROL SULFATE HFA 108 (90 BASE) MCG/ACT IN AERS
2.0000 | INHALATION_SPRAY | RESPIRATORY_TRACT | Status: DC | PRN
  Administered 2012-06-03 – 2012-06-05 (×4): 2 via RESPIRATORY_TRACT
  Filled 2012-05-31: qty 6.7

## 2012-05-31 MED ORDER — SUCCINYLCHOLINE CHLORIDE 20 MG/ML IJ SOLN
INTRAMUSCULAR | Status: DC | PRN
  Administered 2012-05-31: 100 mg via INTRAVENOUS

## 2012-05-31 MED ORDER — HYDROMORPHONE HCL PF 1 MG/ML IJ SOLN: 0.2500 mg | INTRAMUSCULAR | Status: DC | PRN

## 2012-05-31 MED ORDER — HYDROMORPHONE HCL PF 1 MG/ML IJ SOLN
INTRAMUSCULAR | Status: DC | PRN
  Administered 2012-05-31 (×2): 0.5 mg via INTRAVENOUS

## 2012-05-31 MED ORDER — MIDAZOLAM HCL 5 MG/5ML IJ SOLN
INTRAMUSCULAR | Status: DC | PRN
  Administered 2012-05-31: 2 mg via INTRAVENOUS

## 2012-05-31 MED ORDER — VITAMINS A & D EX OINT
TOPICAL_OINTMENT | CUTANEOUS | Status: AC
  Administered 2012-05-31: 21:00:00
  Filled 2012-05-31: qty 5

## 2012-05-31 SURGICAL SUPPLY — 91 items
APPLICATOR COTTON TIP 6IN STRL (MISCELLANEOUS) IMPLANT
APPLIER CLIP 5 13 M/L LIGAMAX5 (MISCELLANEOUS)
APPLIER CLIP ROT 10 11.4 M/L (STAPLE)
BLADE EXTENDED COATED 6.5IN (ELECTRODE) IMPLANT
BLADE HEX COATED 2.75 (ELECTRODE) ×3 IMPLANT
BLADE SURG SZ10 CARB STEEL (BLADE) ×3 IMPLANT
CANISTER SUCTION 2500CC (MISCELLANEOUS) ×3 IMPLANT
CANNULA ENDOPATH XCEL 11M (ENDOMECHANICALS) IMPLANT
CELLS DAT CNTRL 66122 CELL SVR (MISCELLANEOUS) IMPLANT
CHLORAPREP W/TINT 26ML (MISCELLANEOUS) ×3 IMPLANT
CLIP APPLIE 5 13 M/L LIGAMAX5 (MISCELLANEOUS) IMPLANT
CLIP APPLIE ROT 10 11.4 M/L (STAPLE) IMPLANT
CLIP TI LARGE 6 (CLIP) IMPLANT
CLOTH BEACON ORANGE TIMEOUT ST (SAFETY) ×3 IMPLANT
COVER MAYO STAND STRL (DRAPES) ×3 IMPLANT
DECANTER SPIKE VIAL GLASS SM (MISCELLANEOUS) IMPLANT
DERMABOND ADVANCED (GAUZE/BANDAGES/DRESSINGS)
DERMABOND ADVANCED .7 DNX12 (GAUZE/BANDAGES/DRESSINGS) IMPLANT
DRAPE LAPAROSCOPIC ABDOMINAL (DRAPES) ×3 IMPLANT
DRAPE LG THREE QUARTER DISP (DRAPES) IMPLANT
DRAPE WARM FLUID 44X44 (DRAPE) ×3 IMPLANT
DRSG PAD ABDOMINAL 8X10 ST (GAUZE/BANDAGES/DRESSINGS) IMPLANT
ELECT REM PT RETURN 9FT ADLT (ELECTROSURGICAL) ×3
ELECTRODE REM PT RTRN 9FT ADLT (ELECTROSURGICAL) ×2 IMPLANT
ENSEAL DEVICE STD TIP 35CM (ENDOMECHANICALS) IMPLANT
FILTER SMOKE EVAC LAPAROSHD (FILTER) IMPLANT
GLOVE BIO SURGEON STRL SZ7.5 (GLOVE) ×6 IMPLANT
GLOVE BIOGEL M 7.0 STRL (GLOVE) IMPLANT
GLOVE BIOGEL PI IND STRL 7.0 (GLOVE) ×2 IMPLANT
GLOVE BIOGEL PI IND STRL 7.5 (GLOVE) IMPLANT
GLOVE BIOGEL PI INDICATOR 7.0 (GLOVE) ×1
GLOVE BIOGEL PI INDICATOR 7.5 (GLOVE)
GLOVE EUDERMIC 7 POWDERFREE (GLOVE) ×3 IMPLANT
GLOVE SS BIOGEL STRL SZ 7 (GLOVE) ×8 IMPLANT
GLOVE SUPERSENSE BIOGEL SZ 7 (GLOVE) ×4
GLOVE SURG SIGNA 7.5 PF LTX (GLOVE) ×6 IMPLANT
GLOVE SURG SS PI 6.5 STRL IVOR (GLOVE) ×3 IMPLANT
GOWN PREVENTION PLUS LG XLONG (DISPOSABLE) ×3 IMPLANT
GOWN PREVENTION PLUS XLARGE (GOWN DISPOSABLE) IMPLANT
GOWN STRL NON-REIN LRG LVL3 (GOWN DISPOSABLE) ×6 IMPLANT
GOWN STRL REIN XL XLG (GOWN DISPOSABLE) ×6 IMPLANT
HAND ACTIVATED (MISCELLANEOUS) IMPLANT
KIT BASIN OR (CUSTOM PROCEDURE TRAY) ×3 IMPLANT
LEGGING LITHOTOMY PAIR STRL (DRAPES) IMPLANT
LIGASURE IMPACT 36 18CM CVD LR (INSTRUMENTS) ×3 IMPLANT
NS IRRIG 1000ML POUR BTL (IV SOLUTION) ×6 IMPLANT
PACK GENERAL/GYN (CUSTOM PROCEDURE TRAY) ×3 IMPLANT
PENCIL BUTTON HOLSTER BLD 10FT (ELECTRODE) ×3 IMPLANT
POUCH SPECIMEN RETRIEVAL 10MM (ENDOMECHANICALS) IMPLANT
RELOAD PROXIMATE 75MM BLUE (ENDOMECHANICALS) ×6 IMPLANT
RTRCTR WOUND ALEXIS 18CM MED (MISCELLANEOUS)
SCALPEL HARMONIC ACE (MISCELLANEOUS) IMPLANT
SET IRRIG TUBING LAPAROSCOPIC (IRRIGATION / IRRIGATOR) ×3 IMPLANT
SOLUTION ANTI FOG 6CC (MISCELLANEOUS) ×3 IMPLANT
SPONGE GAUZE 4X4 12PLY (GAUZE/BANDAGES/DRESSINGS) ×3 IMPLANT
SPONGE LAP 18X18 X RAY DECT (DISPOSABLE) ×3 IMPLANT
STAPLER GUN LINEAR PROX 60 (STAPLE) ×3 IMPLANT
STAPLER PROXIMATE 75MM BLUE (STAPLE) ×3 IMPLANT
STAPLER VISISTAT 35W (STAPLE) ×3 IMPLANT
STRIP CLOSURE SKIN 1/2X4 (GAUZE/BANDAGES/DRESSINGS) IMPLANT
SUCTION POOLE TIP (SUCTIONS) ×3 IMPLANT
SUT NOV 1 T60/GS (SUTURE) IMPLANT
SUT NOVA 1 T20/GS 25DT (SUTURE) ×9 IMPLANT
SUT NOVA NAB DX-16 0-1 5-0 T12 (SUTURE) IMPLANT
SUT NOVA T20/GS 25 (SUTURE) IMPLANT
SUT PDS AB 1 TP1 96 (SUTURE) ×6 IMPLANT
SUT PROLENE 2 0 KS (SUTURE) IMPLANT
SUT PROLENE 2 0 SH DA (SUTURE) IMPLANT
SUT SILK 2 0 (SUTURE) ×1
SUT SILK 2 0 SH CR/8 (SUTURE) ×3 IMPLANT
SUT SILK 2 0SH CR/8 30 (SUTURE) IMPLANT
SUT SILK 2-0 18XBRD TIE 12 (SUTURE) ×2 IMPLANT
SUT SILK 2-0 30XBRD TIE 12 (SUTURE) IMPLANT
SUT SILK 3 0 (SUTURE) ×1
SUT SILK 3 0 SH CR/8 (SUTURE) ×6 IMPLANT
SUT SILK 3-0 18XBRD TIE 12 (SUTURE) ×2 IMPLANT
SYR BULB IRRIGATION 50ML (SYRINGE) ×3 IMPLANT
SYS LAPSCP GELPORT 120MM (MISCELLANEOUS)
SYSTEM LAPSCP GELPORT 120MM (MISCELLANEOUS) IMPLANT
TOWEL OR 17X26 10 PK STRL BLUE (TOWEL DISPOSABLE) ×9 IMPLANT
TOWEL OR NON WOVEN STRL DISP B (DISPOSABLE) ×3 IMPLANT
TRAY FOLEY CATH 14FRSI W/METER (CATHETERS) ×3 IMPLANT
TRAY LAP CHOLE (CUSTOM PROCEDURE TRAY) ×3 IMPLANT
TROCAR BLADELESS OPT 5 75 (ENDOMECHANICALS) ×3 IMPLANT
TROCAR XCEL 12X100 BLDLESS (ENDOMECHANICALS) IMPLANT
TROCAR XCEL BLUNT TIP 100MML (ENDOMECHANICALS) ×3 IMPLANT
TROCAR XCEL NON-BLD 11X100MML (ENDOMECHANICALS) IMPLANT
TUBING FILTER THERMOFLATOR (ELECTROSURGICAL) ×3 IMPLANT
WATER STERILE IRR 1500ML POUR (IV SOLUTION) IMPLANT
YANKAUER SUCT BULB TIP 10FT TU (MISCELLANEOUS) ×3 IMPLANT
YANKAUER SUCT BULB TIP NO VENT (SUCTIONS) ×3 IMPLANT

## 2012-05-31 NOTE — H&P (View-Only) (Signed)
Subjective:     Patient ID: Amy [name], female   DOB: 02/24/1946, 66 y.o.   MRN: [id number]  HPI This is a 66-year-old female with a history of stage IIIB colon cancer treated with a right hemicolectomy in 1999 who underwent a radical right nephrectomy for a stage II renal cancer at the same time. She subsequently had a stage IA non small cell lung cancer treated with a right thoracotomy and a right lower lobe superior segmentectomy. I saw her and she had an ulcerated sessile firm mass measuring 2 cm in the distal transverse colon at the biopsies were not definitive. She underwent a PET scan prior to this visit which shows her to have no hypermetabolic foci for many the patient's primary and likely some physiologic hypermetabolism about the left adrenal gland and sigmoid colon. She comes in today to discuss the next step which would be surgery.  Review of Systems NUCLEAR MEDICINE PET SKULL BASE TO THIGH  Fasting Blood Glucose: 158  Technique: 14.5 mCi F-18 FDG was injected intravenously. CT data  was obtained and used for attenuation correction and anatomic  localization only. (This was not acquired as a diagnostic CT  examination.) Additional exam technical data entered on  technologist worksheet.  Comparison: Today's chest CT, dictated separately. Abdominal  pelvic CT of 04/05/2012.  Findings:  Neck: No abnormal hypermetabolism.  Chest: No abnormal hypermetabolism.  Abdomen/Pelvis: Mild hypermetabolism about the inferior aspect of  the left adrenal gland. This is inferior to mild nodularity,  identified on image 129. Favored to be physiologic.  Multifocal moderate hypermetabolism involving the sigmoid colon,  without dominant mass on CT.  Skeleton: No abnormal marrow/bone hypermetabolism.  CT images performed for attenuation correction demonstrate no  significant findings within the neck. Chest, abdomen, and pelvic  findings deferred to today's chest CT and the abdominal pelvic CT  of  04/05/2012. No evidence of acute superimposed process within  the abdomen/pelvis. Possible constipation. Hysterectomy. Right  nephrectomy. Degenerative sclerosis of the left sacroiliac joint.  Exam is mildly degraded secondary patient body habitus.  IMPRESSION:  1. No hypermetabolic foci from any of the patient's primaries.  2. Likely physiologic hypermetabolism about the left adrenal gland  and sigmoid colon.  3. Mild degradation secondary patient body habitus.     Objective:   Physical Exam Deferred today    Assessment:     Transverse colon mass    Plan:     We discussed results of PET scan today.  It is negative and we will proceed with surgery now.  I discussed this with her today. Please see my prior note for this conversation which we went over today again.      

## 2012-05-31 NOTE — Op Note (Signed)
Preoperative diagnosis: Transverse colon mass, history of prior colon cancer status post right colectomy Os operative diagnosis: Same as above Procedure: #1 lysis of adhesions x1 hour #2 transverse colectomy with ileocolonic anastomosis #3 primary repair of incisional hernia Surgeon: Dr. Harden Mo Asst.: Dr. Carman Ching Anesthesia: Gen. Estimated blood loss: 100 cc Drains: None Specimens: Small bowel and colon to pathology Sponge and needle count was correct x2 at end of operation Complications: None Disposition to recovery stable  Indications: This is a 66 year old female who has a history of smoking, diabetes, obesity, colon cancer, renal cell cancer, and lung cancer who presents after undergoing a screening colonoscopy with the finding of a distal transverse colon mass that was biopsied and benign. This could not be removed endoscopically. She had 2 other small polyps that were removed and were benign. She underwent a PET scan due to were prior history and this was negative. I then discussed with her a segmental resection of this transverse colon mass. I discussed with her possibly a completion colectomy due to her history but I think due to were medical comorbidities she would not really be able to tolerate this. Due to that I then elected to discuss what would likely be a segmental resection of this mass. We discussed the risks and benefits associated with that.  Procedure: After informed consent was obtained the patient was taken to the operating room. She had been administered entereg and subq heparin. She also had a bowel prep. She was placed under general anesthesia without complication. She was administered 1 g of intravenous Invanz. She had a Foley catheter and nasogastric tube placed. Her abdomen was then prepped and draped in the standard sterile surgical fashion. A surgical timeout was performed.  I thought about doing this with a laparoscope but I was really unable to reduce  her hernia she was asleep. I elected to do this open. I used a portion of her upper midline incision to just below umbilicus. I excised the scar in this region. I took this down into the fascia superiorly. She was noted to have a fairly large incisional hernia with her transverse colon incarcerated in it. I spent about one hour lysing of adhesions and just getting into the peritoneum. Eventually I was able to free up the hernia sac as well as the colon. This was done with some difficulty. Once I had done that it was very clear that the tattoo that had been placed by Dr [name] was present in the hernia sac. I identified both the proximal and distal  tattoos which were 5 cm in either direction. The mass was palpable in between these two. She had had a prior right colectomy so I mobilized the remaining transverse colon as well as what appeared to be the small bowel colonic anastomosis. This again was done with some difficulty due to were adhesions. Once I had done this I was able to identify the terminal ileum as well as a point distal to the mass on the transverse colon. I elected to divide the small intestine the terminal ileum with the GIA stapler. I also divided the transverse colon distal to the mass and distal to the tattoo that was present. I then came across the entire mesentery with a combination of the LigaSure device as well as 2-0 silk sutures. I then passed this off the table as a specimen. I sent this to pathology and they confirmed that I had removed the lesion and my margins were adequate. I then  placed the small bowel next to the transverse colon. I attached these with 3-0 silk sutures. I then made enterotomies in both. We inserted a GIA stapler and created an anastomosis. I then used Allis clamps to bring the common enterotomy together and I closed this with a TA stapler. I oversewed this with 3-0 silk. I placed an additional crotch stitch of 3-0 silk. I then closed the mesenteric defect with 2-0 silk.  I inspected the remainder of the bowel and it was all in good position and no evidence of any injuries from her enteral lysis. The anastomosis was pink and patent. I then placed the remaining omentum overlying this. I then closed the abdomen without any tension using #1 Novafil sutures in a figure-of-eight fashion. Ideally for a hernia repair she should have mesh placed but I was not going to do this obviously with a colon operation I told her that this would likely recur. I then irrigated. I decided to place a VAC sponge due to my concern for infection given the surgery as well as her comorbidities. She tolerated this well was extubated and transferred to recovery stable.

## 2012-05-31 NOTE — Progress Notes (Signed)
Pt unsure if she has received the pneumococcal vaccine. Will ask her in the morning when she is more awake.

## 2012-05-31 NOTE — Progress Notes (Signed)
PHARMACY BRIEF NOTE - ALVIMOPAN (ENTEREG)  Pharmacy & Therapeutics Committee policy requires that to be eligible for postoperative alvimopan (Entereg)  therapy, patients must receive the preoperative dose as per the prescribing information. Because alvimopan was not ordered preoperatively (no orders or documentation of administration was found in [facility] or via discussion with nursing), we are not permitted to dispense alvimopan postoperatively.    Thanks,  Juliette Alcide, PharmD, BCPS.   Pager: [phone]

## 2012-05-31 NOTE — Preoperative (Signed)
Beta Blockers   Reason not to administer Beta Blockers:[place] 05-31-12 at 0600

## 2012-05-31 NOTE — Anesthesia Preprocedure Evaluation (Addendum)
Anesthesia Evaluation  Patient identified by MRN, date of birth, ID band Patient awake    Reviewed: Allergy & Precautions, H&P , NPO status , Patient's Chart, lab work & pertinent test results, reviewed documented beta blocker date and time   History of Anesthesia Complications (+) MALIGNANT HYPERTHERMIA  Airway Mallampati: III TM Distance: >3 FB Neck ROM: full    Dental  (+) Missing and Dental Advisory Given,    Pulmonary sleep apnea ,  Mild OSA - no CPAP.  S/p lobectomy for cancer. breath sounds clear to auscultation  Pulmonary exam normal       Cardiovascular Exercise Tolerance: Good hypertension, Pt. on home beta blockers Rhythm:regular Rate:Normal     Neuro/Psych Anxiety Depression negative neurological ROS  negative psych ROS   GI/Hepatic negative GI ROS, Neg liver ROS,   Endo/Other  diabetes, Well Controlled, Type 2, Oral Hypoglycemic AgentsMorbid obesity  Renal/GU negative Renal ROS  negative genitourinary   Musculoskeletal   Abdominal (+) + obese,   Peds  Hematology negative hematology ROS (+)   Anesthesia Other Findings History of lung and renal cell Ca.  Reproductive/Obstetrics negative OB ROS                        Anesthesia Physical Anesthesia Plan  ASA: III  Anesthesia Plan: General   Post-op Pain Management:    Induction: Intravenous  Airway Management Planned: Oral ETT  Additional Equipment:   Intra-op Plan:   Post-operative Plan: Extubation in OR  Informed Consent: I have reviewed the patients History and Physical, chart, labs and discussed the procedure including the risks, benefits and alternatives for the proposed anesthesia with the patient or authorized representative who has indicated his/her understanding and acceptance.   Dental Advisory Given  Plan Discussed with: CRNA and Surgeon  Anesthesia Plan Comments:        Anesthesia Quick  Evaluation

## 2012-05-31 NOTE — Anesthesia Postprocedure Evaluation (Signed)
  Anesthesia Post-op Note  Patient: Amy [name]  Procedure(s) Performed: Procedure(s) (LRB): PARTIAL COLECTOMY (N/A)  Patient Location: PACU  Anesthesia Type: General  Level of Consciousness: awake and alert   Airway and Oxygen Therapy: Patient Spontanous Breathing  Post-op Pain: mild  Post-op Assessment: Post-op Vital signs reviewed, Patient's Cardiovascular Status Stable, Respiratory Function Stable, Patent Airway and No signs of Nausea or vomiting  Post-op Vital Signs: stable  Complications: No apparent anesthesia complications

## 2012-05-31 NOTE — Transfer of Care (Signed)
Immediate Anesthesia Transfer of Care Note  Patient: Amy [name]  Procedure(s) Performed: Procedure(s) (LRB) with comments: PARTIAL COLECTOMY (N/A)  Patient Location: PACU  Anesthesia Type:General  Level of Consciousness: awake, alert , oriented and patient cooperative  Airway & Oxygen Therapy: Patient Spontanous Breathing and Patient connected to face mask oxygen  Post-op Assessment: Report given to PACU RN, Post -op Vital signs reviewed and stable and Patient moving all extremities  Post vital signs: Reviewed and stable  Complications: No apparent anesthesia complications

## 2012-05-31 NOTE — Interval H&P Note (Signed)
History and Physical Interval Note:  05/31/2012 11:11 AM  Amy [name]  has presented today for surgery, with the diagnosis of colon mass  The various methods of treatment have been discussed with the patient and family. After consideration of risks, benefits and other options for treatment, the patient has consented to  Procedure(s) (LRB) with comments: LAPAROSCOPIC PARTIAL COLECTOMY (N/A) PARTIAL COLECTOMY (N/A) as a surgical intervention .  The patient's history has been reviewed, patient examined, no change in status, stable for surgery.  I have reviewed the patient's chart and labs.  Questions were answered to the patient's satisfaction.     [name]

## 2012-06-01 ENCOUNTER — Encounter (HOSPITAL_COMMUNITY): Payer: Self-pay | Admitting: General Surgery

## 2012-06-01 LAB — BASIC METABOLIC PANEL
BUN: 13 mg/dL (ref 6–23)
Calcium: 8.8 mg/dL (ref 8.4–10.5)
GFR calc Af Amer: 78 mL/min — ABNORMAL LOW (ref >90)
GFR calc non Af Amer: 67 mL/min — ABNORMAL LOW (ref >90)
Glucose, Bld: 139 mg/dL — ABNORMAL HIGH (ref 70–99)
Sodium: 142 mEq/L (ref 135–145)

## 2012-06-01 LAB — CBC
MCH: 28.6 pg (ref 26.0–34.0)
MCHC: 32.7 g/dL (ref 30.0–36.0)
Platelets: 123 10*3/uL — ABNORMAL LOW (ref 150–400)
RBC: 4.48 MIL/uL (ref 3.87–5.11)

## 2012-06-01 MED ORDER — PHENOL 1.4 % MT LIQD
1.0000 | OROMUCOSAL | Status: DC | PRN
  Filled 2012-06-01: qty 177

## 2012-06-01 MED ORDER — BIOTENE DRY MOUTH MT LIQD
15.0000 mL | Freq: Two times a day (BID) | OROMUCOSAL | Status: DC
  Administered 2012-06-01 – 2012-06-04 (×7): 15 mL via OROMUCOSAL

## 2012-06-01 MED ORDER — SODIUM CHLORIDE 0.9 % IV BOLUS (SEPSIS)
500.0000 mL | Freq: Once | INTRAVENOUS | Status: AC
  Administered 2012-06-01: 500 mL via INTRAVENOUS

## 2012-06-01 MED ORDER — CHLORHEXIDINE GLUCONATE 0.12 % MT SOLN
15.0000 mL | Freq: Two times a day (BID) | OROMUCOSAL | Status: DC
  Administered 2012-06-01 – 2012-06-05 (×9): 15 mL via OROMUCOSAL
  Filled 2012-06-01 (×11): qty 15

## 2012-06-01 NOTE — Progress Notes (Signed)
Pt said she had PNA vaccine many years ago and is unsure is she wants to have the vaccine now. I will give her education handouts, and she agreed to make a decision later in the day.

## 2012-06-01 NOTE — Progress Notes (Addendum)
1 Day Post-Op  Subjective: Pain controlled, nor real complaints this am, no flatus yet  Objective: Vital signs in last 24 hours: Temp:  [98 F (36.7 C)-98.9 F (37.2 C)] 98.7 F (37.1 C) (11/08 0400) Pulse Rate:  [72-110] 110  (11/08 0542) Resp:  [14-28] 18  (11/08 0542) BP: (98-171)/(60-96) 117/60 mmHg (11/08 0542) SpO2:  [87 %-99 %] 97 % (11/08 0542) Weight:  [262 lb 9.1 oz (119.1 kg)-263 lb 10.7 oz (119.6 kg)] 263 lb 10.7 oz (119.6 kg) (11/08 0000)    Intake/Output from previous day: 11/07 0701 - 11/08 0700 In: 4860 [I.V.:4850; IV Piggyback:10] Out: 1460 [Urine:910; Emesis/NG output:450; Blood:100] Intake/Output this shift:    General appearance: no distress Resp: diminished breath sounds bibasilar Cardio: mild tachycardia, rr GI: approp tender, no bs, vac in place  Lab Results:   Basename 06/01/12 0345 05/31/12 1553  WBC 11.4* 10.7*  HGB 12.8 13.4  HCT 39.1 40.1  PLT 123* 151   BMET  Basename 06/01/12 0345 05/31/12 1553  NA 142 --  K 3.6 --  CL 102 --  CO2 33* --  GLUCOSE 139* --  BUN 13 --  CREATININE 0.88 0.93  CALCIUM 8.8 --   Assessment/Plan: POD 1 colectomy 1. Neuro- continue pca, no toradol due to age, bm and one kidney 2. CV- continue beta blockade, I think tachycardia due to volume, will be able to restart home meds over weekend 3. Pulm- needs aggressive pulm toilet, oob today 4. GI- await ileus resolve, ng to remain today, entereg 5. Renal- foley out in am will leave today to monitor, cr stable 6. Endo- cont ssi 7. Heparin/scds for proph 8. Will need vac change this weekend 9. tx to [facility]   LOS: 1 day    [facility] 06/01/2012

## 2012-06-01 NOTE — Care Management Note (Addendum)
    Page 1 of 2   06/06/2012     12:19:01 PM   CARE MANAGEMENT NOTE 06/06/2012  Patient:  Amy [name], Amy [name]   Account Number:  [id number]  Date Initiated:  06/01/2012  Documentation initiated by:  Lorenda Ishihara  Subjective/Objective Assessment:   66 yo female admitted s/p open lysis, colectomy, hernia repair. PTA lived at home with brother     Action/Plan:   home when stable   Anticipated DC Date:  06/07/2012   Anticipated DC Plan:  HOME W HOME HEALTH SERVICES      DC Planning Services  CM consult      PAC Choice  DURABLE MEDICAL EQUIPMENT  HOME HEALTH   Choice offered to / List presented to:  C-1 Patient   DME arranged  VAC      DME agency  [facility]     HH arranged  HH-1 RN      [facility] agency  [facility]   Status of service:  Completed, signed off Medicare Important Message given?   (If response is "NO", the following Medicare IM given date fields will be blank) Date Medicare IM given:   Date Additional Medicare IM given:    Discharge Disposition:  HOME W HOME HEALTH SERVICES  Per UR Regulation:  Reviewed for med. necessity/level of care/duration of stay  If discussed at Long Length of Stay Meetings, dates discussed:    Comments:  06-06-12 Lorenda Ishihara RN CM 1200 DME Vac for wound approved and will be delivered today. Patient on target for anticipated d/c tomorrow.  06-05-12 Lorenda Ishihara RN CM 1300 Spoke with patient at bedside. Discussed HH needs. Patient likely to d/c with wound vac. Provided list for choice of HH agencies. [facility] choice [facility], they did not take the insurance. [facility] able to take patient. Contacted [facility] for wound VAC. Rickie to come to complete application for insurance coverage. No DME needs noted. Will continue to follow.

## 2012-06-01 NOTE — Clinical Documentation Improvement (Signed)
BMI DOCUMENTATION CLARIFICATION QUERY  THIS DOCUMENT IS NOT A PERMANENT PART OF THE MEDICAL RECORD  TO RESPOND TO THE THIS QUERY, FOLLOW THE INSTRUCTIONS BELOW:  1. If needed, update documentation for the patient's encounter via the notes activity.  2. Access this query again and click edit on the In [employer].  3. After updating, or not, click F2 to complete all highlighted (required) fields concerning your review. Select "additional documentation in the medical record" OR "no additional documentation provided".  4. Click Sign note button.  5. The deficiency will fall out of your In Basket *Please let us know if you are not able to complete this workflow by phone or e-mail (listed below).         06/01/12  Dear Dr. Dwain Sarna, Judie Petit Marton [place]  In an effort to better capture your patient's severity of illness, reflect appropriate length of stay and utilization of resources, a review of the patient medical record has revealed the following indicators.    Based on your clinical judgment, please clarify and document in a progress note and/or discharge summary the clinical condition associated with the following supporting information:  In responding to this query please exercise your independent judgment.  The fact that a query is asked, does not imply that any particular answer is desired or expected.   Based on your clinical judgment can you provide a diagnosis that represents the below listed clinical indicators?  In this patient admitted with stage IIIB colon cancer a review of the medical record reveals the following:   Obesity per Op note: 05/31/12  BMI= 42.56  HT= 5'6"   WT= 263 lbs 10.7 oz  DM  Treatment  Monitoring pt  Monitoring Input and Output  Novolog  Please clarify whether or not BMI can be linked to one of the diagnoses listed below and document in pn  and d/c. Thank You!  BEST PRACTICE: When linking BMI to a diagnosis please document both BMI and  diagnosis together in pn for accuracy of severity of illness (SOI) and risk of mortality (ROM).   Possible Clinical conditions   Morbid Obesity W/ BMI=     Underweight w/BMI=    Other condition___________________    Cannot Clinically determine _____________  Risk Factors: Sign & Symptoms: Weight:  Height  BMI=   Diagnostics: Lab:  Treatment Nutrition Note:  Nursing note:  Supplements:  Medications:    Reviewed:  no additional documentation provided ljh  Thank You,  Enis Slipper  RN, BSN, MSN/Inf, CCDS Clinical Documentation Specialist [facility] Dept Pager: [phone] / E-mail: [email or web address]  Health Information Management [place]

## 2012-06-01 NOTE — Progress Notes (Signed)
INITIAL ADULT NUTRITION ASSESSMENT Date: 06/01/2012   Time: 11:11 AM Reason for Assessment: Nutrition risk  ASSESSMENT: Female 66 y.o.  Dx: benign transverse colon mass.  POD #1 colectomy with ileocolonic anastomosis, wound vac  Hx:  Past Medical History  Diagnosis Date  . Depression   . Diabetes mellitus     diet controlled  . Anemia   . Anxiety   . Arthritis   . Hypertension   . Hernia     Near umbilicus  . Hemorrhoids   . Heart murmur   . Cancer     colon,renal,lung  . Sleep apnea     mild no cpap need has been tested in the pass score was 11   Past Surgical History  Procedure Date  . Colonoscopy   . Polypectomy   . Lung lobectomy     right lower  . Colon surgery     chemo  . Kidney surgery 1999    right/chemo kidney removed for cancer  . Partial hysterectomy   . Breast biopsy     Bilateral cysts/benign  . Colectomy 05-31-2012    laparoscopic ,possible open  . Abdominal hysterectomy 80's  . Tonsillectomy     as child  . Partial colectomy 05/31/2012    Procedure: PARTIAL COLECTOMY;  Surgeon: Emelia Loron, MD;  Location: [facility] OR[facility];  Service: General;  Laterality: N/A;    Related Meds:     . antiseptic oral rinse  15 mL Mouth Rinse q12n4p  . chlorhexidine  15 mL Mouth Rinse BID  . [COMPLETED] ertapenem  1 g Intravenous On Call to OR  . heparin  5,000 Units Subcutaneous Q8H  . insulin aspart  0-20 Units Subcutaneous TID WC  . metoprolol  5 mg Intravenous Q6H  . morphine   Intravenous Q4H  . [EXPIRED] morphine      . pantoprazole (PROTONIX) IV  40 mg Intravenous QHS  . [COMPLETED] sodium chloride  500 mL Intravenous Once  . [COMPLETED] vitamin A & D      . [DISCONTINUED] alvimopan  12 mg Oral BID  . [DISCONTINUED] antiseptic oral rinse  15 mL Mouth Rinse BID    Ht: 5\' 6"  (167.6 cm)  Wt: 263 lb 10.7 oz (119.6 kg)  Ideal Wt: 59.3 kg  % Ideal Wt: 202  Usual Wt:  Wt Readings from Last 10 Encounters:  06/01/12 263 lb 10.7 oz (119.6 kg)  06/01/12  263 lb 10.7 oz (119.6 kg)  05/24/12 265 lb 9.6 oz (120.475 kg)  05/08/12 263 lb 9.6 oz (119.568 kg)  04/23/12 258 lb 6.4 oz (117.209 kg)  04/20/12 264 lb (119.75 kg)  04/02/12 268 lb (121.564 kg)  03/19/12 268 lb (121.564 kg)  01/20/11 276 lb (125.193 kg)  01/06/11 276 lb (125.193 kg)    % Usual Wt: 96  Body mass index is 42.56 kg/(m^2).   Labs:  CMP     Component Value Date/Time   NA 142 06/01/2012 0345   K 3.6 06/01/2012 0345   CL 102 06/01/2012 0345   CO2 33* 06/01/2012 0345   GLUCOSE 139* 06/01/2012 0345   BUN 13 06/01/2012 0345   CREATININE 0.88 06/01/2012 0345   CALCIUM 8.8 06/01/2012 0345   PROT 7.0 04/02/2012 1147   ALBUMIN 4.1 04/02/2012 1147   AST 22 04/02/2012 1147   ALT 19 04/02/2012 1147   ALKPHOS 67 04/02/2012 1147   BILITOT 0.9 04/02/2012 1147   GFRNONAA 67* 06/01/2012 0345   GFRAA 78* 06/01/2012 0345  I/O last 3 completed shifts: In: 4985 [I.V.:4975; IV Piggyback:10] Out: 1460 [Urine:910; Emesis/NG output:450; Blood:100] Total I/O In: 700 [I.V.:200; Other:500] Out: 150 [Urine:150]   Diet Order: NPO  Supplements/Tube Feeding:  none  IVF:    sodium chloride Last Rate: 125 mL/hr at 05/31/12 2353  [DISCONTINUED] lactated ringers   [DISCONTINUED] lactated ringers     Estimated Nutritional Needs:   Kcal: 1900-2000 Protein: 100-120 gm Fluid: 1.0-2L Food/Nutrition Related Hx: Eating well prior to admit with small amount of weight loss since June.  Pt reports eating healthy. Passing flatus. NUTRITION DIAGNOSIS: -Inadequate oral intake (NI-2.1).  Status: Ongoing  RELATED TO: altered gi function  AS EVIDENCE BY: npo status  MONITORING/EVALUATION(Goals): Tolerate diet advancement per MD  EDUCATION NEEDS: -No education needs identified at this time  INTERVENTION: Diet advancement when medically ready per MD  Dietitian [phone]  DOCUMENTATION CODES Per approved criteria  -Morbid Obesity    Derrell Lolling Anastasia Fiedler 06/01/2012, 11:11 AM

## 2012-06-02 DIAGNOSIS — K432 Incisional hernia without obstruction or gangrene: Secondary | ICD-10-CM | POA: Diagnosis present

## 2012-06-02 DIAGNOSIS — K6389 Other specified diseases of intestine: Secondary | ICD-10-CM | POA: Diagnosis present

## 2012-06-02 LAB — GLUCOSE, CAPILLARY
Glucose-Capillary: 114 mg/dL — ABNORMAL HIGH (ref 70–99)
Glucose-Capillary: 120 mg/dL — ABNORMAL HIGH (ref 70–99)
Glucose-Capillary: 105 mg/dL — ABNORMAL HIGH (ref 70–99)

## 2012-06-02 LAB — BASIC METABOLIC PANEL
BUN: 8 mg/dL (ref 6–23)
CO2: 33 mEq/L — ABNORMAL HIGH (ref 19–32)
Chloride: 102 mEq/L (ref 96–112)
Creatinine, Ser: 0.75 mg/dL (ref 0.50–1.10)
Potassium: 3.4 mEq/L — ABNORMAL LOW (ref 3.5–5.1)

## 2012-06-02 MED ORDER — LIP MEDEX EX OINT
TOPICAL_OINTMENT | CUTANEOUS | Status: AC
  Administered 2012-06-02: 12:00:00
  Filled 2012-06-02: qty 7

## 2012-06-02 NOTE — Progress Notes (Signed)
2 Days Post-Op  Subjective: Adequate pain control.  No flatus or BM.  Has been OOB.  Objective: Vital signs in last 24 hours: Temp:  [97.8 F (36.6 C)-100 F (37.8 C)] 97.8 F (36.6 C) (11/09 0637) Pulse Rate:  [95-105] 105  (11/09 0606) Resp:  [15-28] 15  (11/09 0800) BP: (125-148)/(65-82) 134/74 mmHg (11/09 0606) SpO2:  [90 %-97 %] 96 % (11/09 0800) FiO2 (%):  [48 %] 48 % (11/09 0010)    Intake/Output from previous day: 11/08 0701 - 11/09 0700 In: 3362.5 [I.V.:2862.5] Out: 1660 [Urine:1300; Emesis/NG output:360] Intake/Output this shift:    PE: General- In NAD Abdomen-soft, obese, VAC on.  Lab Results:   Basename 06/01/12 0345 05/31/12 1553  WBC 11.4* 10.7*  HGB 12.8 13.4  HCT 39.1 40.1  PLT 123* 151   BMET  Basename 06/02/12 0504 06/01/12 0345  NA 142 142  K 3.4* 3.6  CL 102 102  CO2 33* 33*  GLUCOSE 119* 139*  BUN 8 13  CREATININE 0.75 0.88  CALCIUM 8.5 8.8   PT/INR No results found for this basename: LABPROT:2,INR:2 in the last 72 hours Comprehensive Metabolic Panel:    Component Value Date/Time   NA 142 06/02/2012 0504   K 3.4* 06/02/2012 0504   CL 102 06/02/2012 0504   CO2 33* 06/02/2012 0504   BUN 8 06/02/2012 0504   CREATININE 0.75 06/02/2012 0504   GLUCOSE 119* 06/02/2012 0504   CALCIUM 8.5 06/02/2012 0504   AST 22 04/02/2012 1147   ALT 19 04/02/2012 1147   ALKPHOS 67 04/02/2012 1147   BILITOT 0.9 04/02/2012 1147   PROT 7.0 04/02/2012 1147   ALBUMIN 4.1 04/02/2012 1147     Studies/Results: No results found.  Anti-infectives: Anti-infectives     Start     Dose/Rate Route Frequency Ordered Stop   05/31/12 1006   ertapenem (INVANZ) 1 g in sodium chloride 0.9 % 50 mL IVPB        1 g 100 mL/hr over 30 Minutes Intravenous On call to O.R. 05/31/12 1006 05/31/12 1135          Assessment Principal Problem:  *Incisional hernia  Colonic mass-s/p partial colectomy, primary hernia repair and VAC application 05/31/12-some postop ileus    LOS: 2 days     Plan: Continue ng until ileus improves.  VAC change tomorrow.   [place] Silveria J 06/02/2012

## 2012-06-03 DIAGNOSIS — D696 Thrombocytopenia, unspecified: Secondary | ICD-10-CM

## 2012-06-03 DIAGNOSIS — E876 Hypokalemia: Secondary | ICD-10-CM

## 2012-06-03 LAB — CBC
HCT: 33.5 % — ABNORMAL LOW (ref 36.0–46.0)
MCH: 28.9 pg (ref 26.0–34.0)
MCV: 87.2 fL (ref 78.0–100.0)
Platelets: 116 10*3/uL — ABNORMAL LOW (ref 150–400)
RBC: 3.84 MIL/uL — ABNORMAL LOW (ref 3.87–5.11)
WBC: 8 10*3/uL (ref 4.0–10.5)

## 2012-06-03 LAB — GLUCOSE, CAPILLARY: Glucose-Capillary: 117 mg/dL — ABNORMAL HIGH (ref 70–99)

## 2012-06-03 LAB — BASIC METABOLIC PANEL
CO2: 31 mEq/L (ref 19–32)
Calcium: 8.8 mg/dL (ref 8.4–10.5)
Chloride: 105 mEq/L (ref 96–112)
Creatinine, Ser: 0.65 mg/dL (ref 0.50–1.10)
Glucose, Bld: 118 mg/dL — ABNORMAL HIGH (ref 70–99)
Sodium: 145 mEq/L (ref 135–145)

## 2012-06-03 MED ORDER — KCL IN DEXTROSE-NACL 20-5-0.9 MEQ/L-%-% IV SOLN
INTRAVENOUS | Status: DC
  Administered 2012-06-03: 75 mL/h via INTRAVENOUS
  Administered 2012-06-04 (×2): via INTRAVENOUS
  Filled 2012-06-03 (×4): qty 1000

## 2012-06-03 MED ORDER — POTASSIUM CHLORIDE 10 MEQ/100ML IV SOLN
10.0000 meq | INTRAVENOUS | Status: AC
  Administered 2012-06-03 (×4): 10 meq via INTRAVENOUS
  Filled 2012-06-03 (×4): qty 100

## 2012-06-03 MED ORDER — LORAZEPAM 2 MG/ML IJ SOLN
1.0000 mg | Freq: Every evening | INTRAMUSCULAR | Status: DC | PRN
  Administered 2012-06-04 – 2012-06-05 (×3): 1 mg via INTRAVENOUS
  Filled 2012-06-03 (×4): qty 1

## 2012-06-03 NOTE — Progress Notes (Signed)
She is hypokalemic and mildly thrombocytopenic.  Will replace potassium deficit.  Will need to monitor thrombocytopenia given that she is on Heparin.

## 2012-06-03 NOTE — Progress Notes (Signed)
3 Days Post-Op  Subjective: Has not passed gas in past 24 hours.  Objective: Vital signs in last 24 hours: Temp:  [97.7 F (36.5 C)-99.1 F (37.3 C)] 97.7 F (36.5 C) (11/10 0608) Pulse Rate:  [89-102] 91  (11/10 0055) Resp:  [15-24] 18  (11/10 0725) BP: (128-155)/(63-92) 147/77 mmHg (11/10 0615) SpO2:  [94 %-100 %] 96 % (11/10 0725) FiO2 (%):  [38 %] 38 % (11/10 0000)    Intake/Output from previous day: 11/09 0701 - 11/10 0700 In: 3011.7 [I.V.:2991.7; NG/GT:20] Out: 2300 [Urine:1650; Emesis/NG output:550; Drains:100] Intake/Output this shift: Total I/O In: -  Out: 200 [Urine:200]  PE: General- In NAD Abdomen-soft, obese, VAC removed and wound is clean with granulation tissue forming, active bowel sounds, ng output is bilious  VAC dressing was replaced at bedside.  Lab Results:   Basename 06/01/12 0345 05/31/12 1553  WBC 11.4* 10.7*  HGB 12.8 13.4  HCT 39.1 40.1  PLT 123* 151   BMET  Basename 06/02/12 0504 06/01/12 0345  NA 142 142  K 3.4* 3.6  CL 102 102  CO2 33* 33*  GLUCOSE 119* 139*  BUN 8 13  CREATININE 0.75 0.88  CALCIUM 8.5 8.8   PT/INR No results found for this basename: LABPROT:2,INR:2 in the last 72 hours Comprehensive Metabolic Panel:    Component Value Date/Time   NA 142 06/02/2012 0504   K 3.4* 06/02/2012 0504   CL 102 06/02/2012 0504   CO2 33* 06/02/2012 0504   BUN 8 06/02/2012 0504   CREATININE 0.75 06/02/2012 0504   GLUCOSE 119* 06/02/2012 0504   CALCIUM 8.5 06/02/2012 0504   AST 22 04/02/2012 1147   ALT 19 04/02/2012 1147   ALKPHOS 67 04/02/2012 1147   BILITOT 0.9 04/02/2012 1147   PROT 7.0 04/02/2012 1147   ALBUMIN 4.1 04/02/2012 1147     Studies/Results: No results found.  Anti-infectives: Anti-infectives     Start     Dose/Rate Route Frequency Ordered Stop   05/31/12 1006   ertapenem (INVANZ) 1 g in sodium chloride 0.9 % 50 mL IVPB        1 g 100 mL/hr over 30 Minutes Intravenous On call to O.R. 05/31/12 1006 05/31/12 1135           Assessment Principal Problem:  *Incisional hernia  Colonic mass-s/p partial colectomy, primary hernia repair and VAC application 05/31/12-still with an ileus; VAC changed today.    LOS: 3 days   Plan: Continue ng until ileus improves.  VAC change Tu, Thurs, Sat.  Decrease IVF.  Check lab today.  Karrina Lye J 06/03/2012

## 2012-06-03 NOTE — Progress Notes (Signed)
Patient had episode of shortness of breath and wheezing.  Inhaler given to  patient pulled up in bed and incentive spirometer.  Patient only able to use is to 500.  Pt respirations decreased from 30 to 22.  Humidity added to nasal cannula.  Patient states she feels better respirations around 20-22 and oxygen at 94% on 3 liters nasal cannula

## 2012-06-04 LAB — GLUCOSE, CAPILLARY
Glucose-Capillary: 110 mg/dL — ABNORMAL HIGH (ref 70–99)
Glucose-Capillary: 114 mg/dL — ABNORMAL HIGH (ref 70–99)
Glucose-Capillary: 133 mg/dL — ABNORMAL HIGH (ref 70–99)
Glucose-Capillary: 115 mg/dL — ABNORMAL HIGH (ref 70–99)

## 2012-06-04 LAB — CBC
HCT: 34.4 % — ABNORMAL LOW (ref 36.0–46.0)
Hemoglobin: 11.2 g/dL — ABNORMAL LOW (ref 12.0–15.0)
MCH: 28.5 pg (ref 26.0–34.0)
MCHC: 32.6 g/dL (ref 30.0–36.0)
MCV: 87.5 fL (ref 78.0–100.0)
RBC: 3.93 MIL/uL (ref 3.87–5.11)

## 2012-06-04 LAB — BASIC METABOLIC PANEL
BUN: 8 mg/dL (ref 6–23)
CO2: 32 mEq/L (ref 19–32)
Chloride: 107 mEq/L (ref 96–112)
Glucose, Bld: 128 mg/dL — ABNORMAL HIGH (ref 70–99)
Potassium: 3 mEq/L — ABNORMAL LOW (ref 3.5–5.1)
Sodium: 146 mEq/L — ABNORMAL HIGH (ref 135–145)

## 2012-06-04 MED ORDER — POTASSIUM CHLORIDE 10 MEQ/100ML IV SOLN
10.0000 meq | INTRAVENOUS | Status: AC
  Administered 2012-06-04 (×4): 10 meq via INTRAVENOUS
  Filled 2012-06-04 (×4): qty 100

## 2012-06-04 NOTE — Progress Notes (Signed)
Agree with above, path pending  

## 2012-06-04 NOTE — Progress Notes (Signed)
4 Days Post-Op  Subjective:  No c/o offered this morning sitting up at bedside after having used bedside commode.  Objective: Vital signs in last 24 hours: Temp:  [98.2 F (36.8 C)-99.3 F (37.4 C)] 98.3 F (36.8 C) (11/11 0402) Pulse Rate:  [84-94] 92  (11/11 0611) Resp:  [18-28] 24  (11/11 0830) BP: (145-159)/(84-93) 159/85 mmHg (11/11 0611) SpO2:  [96 %-99 %] 99 % (11/11 0830) FiO2 (%):  [38 %] 38 % (11/10 2000)    Intake/Output from previous day: 11/10 0701 - 11/11 0700 In: 2274.8 [P.O.:120; I.V.:1753.8; IV Piggyback:400] Out: 1975 [Urine:1550; Emesis/NG output:325; Drains:100] Intake/Output this shift: Total I/O In: -  Out: 100 [Emesis/NG output:100]  General appearance: alert, cooperative, appears stated age and no distress Chest: CTA bilaterally Cardiac: RRR Abdomen: obese, wound vac in place ( output) NG in place currently clamped (350 ml output prior to clamping) non tender, + BS, Flatus. Labs: Wbc wnl H&H stable, Plts trended upward, (currently 138,000), Hypokalemia (being repleted) BUN and Creat wnl and stable.  Lab Results:   [facility] 06/04/12 0420 06/03/12 1023  WBC 7.4 8.0  HGB 11.2* 11.1*  HCT 34.4* 33.5*  PLT 138* 116*   BMET  Basename 06/04/12 0420 06/03/12 1023  NA 146* 145  K 3.0* 3.0*  CL 107 105  CO2 32 31  GLUCOSE 128* 118*  BUN 8 8  CREATININE 0.65 0.65  CALCIUM 9.2 8.8   PT/INR No results found for this basename: LABPROT:2,INR:2 in the last 72 hours ABG No results found for this basename: PHART:2,PCO2:2,PO2:2,HCO3:2 in the last 72 hours  Studies/Results: No results found.  Anti-infectives: Anti-infectives     Start     Dose/Rate Route Frequency Ordered Stop   05/31/12 1006   ertapenem (INVANZ) 1 g in sodium chloride 0.9 % 50 mL IVPB        1 g 100 mL/hr over 30 Minutes Intravenous On call to O.R. 05/31/12 1006 05/31/12 1135          Assessment/Plan:  Patient Active Problem List  Diagnosis  . COLONIC POLYPS,  ADENOMATOUS  . Lung cancer  . Renal cell cancer  . Incisional hernia  . Colonic mass  s/p Procedure(s) (LRB) with comments: PARTIAL COLECTOMY (N/A) 1. NG clamped as per Dr. Dwain Sarna will recheck residual at 1pm today. 2. Continue NPO 3. Repleating Potassium for hypokalemia (secondary to wound vac and NG) 4. Continue to monitor thrombocytopenia  5. OOB, encourage IS   LOS: 4 days    Golda Acre [facility] Surgery Pager # [phone]  06/04/2012

## 2012-06-04 NOTE — Progress Notes (Signed)
Per orders, patient nasogastric tube unclamped and output measured.  Dr. Dwain Sarna notified of amount of output (50ml) and new order received to leave ng tube clamped until tomorrow.  Francene Finders, RN 06/04/2012 1345

## 2012-06-05 LAB — GLUCOSE, CAPILLARY
Glucose-Capillary: 115 mg/dL — ABNORMAL HIGH (ref 70–99)
Glucose-Capillary: 112 mg/dL — ABNORMAL HIGH (ref 70–99)
Glucose-Capillary: 137 mg/dL — ABNORMAL HIGH (ref 70–99)

## 2012-06-05 LAB — BASIC METABOLIC PANEL
Calcium: 9.3 mg/dL (ref 8.4–10.5)
GFR calc non Af Amer: 89 mL/min — ABNORMAL LOW (ref >90)
Glucose, Bld: 139 mg/dL — ABNORMAL HIGH (ref 70–99)
Sodium: 150 mEq/L — ABNORMAL HIGH (ref 135–145)

## 2012-06-05 LAB — CBC
MCH: 28.1 pg (ref 26.0–34.0)
Platelets: 143 10*3/uL — ABNORMAL LOW (ref 150–400)
RBC: 3.88 MIL/uL (ref 3.87–5.11)
WBC: 7.2 10*3/uL (ref 4.0–10.5)

## 2012-06-05 MED ORDER — LISINOPRIL 10 MG PO TABS
10.0000 mg | ORAL_TABLET | Freq: Every day | ORAL | Status: DC
  Filled 2012-06-05: qty 1

## 2012-06-05 MED ORDER — METOPROLOL TARTRATE 100 MG PO TABS
100.0000 mg | ORAL_TABLET | Freq: Every day | ORAL | Status: DC
  Filled 2012-06-05: qty 1

## 2012-06-05 MED ORDER — HYDROCHLOROTHIAZIDE 25 MG PO TABS
25.0000 mg | ORAL_TABLET | Freq: Every day | ORAL | Status: DC
  Administered 2012-06-05 – 2012-06-07 (×3): 25 mg via ORAL
  Filled 2012-06-05 (×3): qty 1

## 2012-06-05 MED ORDER — HYDROCHLOROTHIAZIDE 25 MG PO TABS
25.0000 mg | ORAL_TABLET | Freq: Every day | ORAL | Status: DC
  Filled 2012-06-05: qty 1

## 2012-06-05 MED ORDER — LISINOPRIL 10 MG PO TABS
10.0000 mg | ORAL_TABLET | Freq: Every day | ORAL | Status: DC
  Administered 2012-06-05 – 2012-06-07 (×3): 10 mg via ORAL
  Filled 2012-06-05 (×3): qty 1

## 2012-06-05 MED ORDER — METOPROLOL TARTRATE 100 MG PO TABS
100.0000 mg | ORAL_TABLET | Freq: Every day | ORAL | Status: DC
  Administered 2012-06-05 – 2012-06-07 (×3): 100 mg via ORAL
  Filled 2012-06-05 (×3): qty 1

## 2012-06-05 MED ORDER — HYDROCODONE-ACETAMINOPHEN 5-325 MG PO TABS
1.0000 | ORAL_TABLET | ORAL | Status: DC | PRN
  Administered 2012-06-05: 1 via ORAL
  Administered 2012-06-06 – 2012-06-07 (×2): 2 via ORAL
  Filled 2012-06-05 (×2): qty 2
  Filled 2012-06-05: qty 1
  Filled 2012-06-05: qty 2

## 2012-06-05 MED ORDER — KCL IN DEXTROSE-NACL 20-5-0.45 MEQ/L-%-% IV SOLN
INTRAVENOUS | Status: DC
  Administered 2012-06-05 – 2012-06-06 (×2): via INTRAVENOUS
  Filled 2012-06-05 (×3): qty 1000

## 2012-06-05 NOTE — Progress Notes (Signed)
I have seen and examined the patient and agree with the assessment and plans. NG out, start po  Kennice Finnie A. Magnus Ivan  MD, FACS

## 2012-06-05 NOTE — Progress Notes (Signed)
Patient ID: Amy [name], female   DOB: 09-20-1945, 66 y.o.   MRN: [id number] 5 Days Post-Op  Subjective:  No c/o offered this morning, feeling good, + flatus, No N/V with clamped NG overnight.  Objective: Vital signs in last 24 hours: Temp:  [98.4 F (36.9 C)-99.2 F (37.3 C)] 98.4 F (36.9 C) (11/12 0534) Pulse Rate:  [81-91] 88  (11/12 0534) Resp:  [14-29] 20  (11/12 0806) BP: (152-164)/(84-98) 157/89 mmHg (11/12 0534) SpO2:  [99 %-100 %] 100 % (11/12 0806)    Intake/Output from previous day: 11/11 0701 - 11/12 0700 In: 1773.8 [I.V.:1773.8] Out: 1700 [Urine:1550; Emesis/NG output:150] Intake/Output this shift: Total I/O In: 0  Out: 300 [Urine:300]  General appearance: alert, cooperative, appears stated age and no distress Chest: CTA bilaterally Cardiac: RRR Abdomen: obese, wound vac in place (no output recorded today) NG in place currently clamped , non tender, + BS, Flatus. Labs: Wbc wnl H&H stable, Plts trended upward, Hypernatremic (will adjust IVF)  Lab Results:   [facility] 06/05/12 0420 06/04/12 0420  WBC 7.2 7.4  HGB 10.9* 11.2*  HCT 34.5* 34.4*  PLT 143* 138*   BMET  Basename 06/05/12 0420 06/04/12 0420  NA 150* 146*  K 3.8 3.0*  CL 110 107  CO2 34* 32  GLUCOSE 139* 128*  BUN 7 8  CREATININE 0.69 0.65  CALCIUM 9.3 9.2   PT/INR No results found for this basename: LABPROT:2,INR:2 in the last 72 hours ABG No results found for this basename: PHART:2,PCO2:2,PO2:2,HCO3:2 in the last 72 hours  Studies/Results: No results found.  Anti-infectives: Anti-infectives     Start     Dose/Rate Route Frequency Ordered Stop   05/31/12 1006   ertapenem (INVANZ) 1 g in sodium chloride 0.9 % 50 mL IVPB        1 g 100 mL/hr over 30 Minutes Intravenous On call to O.R. 05/31/12 1006 05/31/12 1135          Assessment/Plan:  Patient Active Problem List  Diagnosis  . COLONIC POLYPS, ADENOMATOUS  . Lung cancer  . Renal cell cancer  . Incisional hernia  .  Colonic mass  s/p Procedure(s) (LRB) with comments: PARTIAL COLECTOMY (N/A) 1.DC NG  Trial of sips of clears. 2. Continue to monitor thrombocytopenia  3. OOB, encourage IS 4. Restarted po HTN medications   LOS: 5 days    Golda Acre [facility] Surgery Pager # [phone]  06/05/2012

## 2012-06-05 NOTE — Progress Notes (Signed)
Wound to midline abd with woundvac measured, 1cm in depth, 15cm in length, and 4.5 cm width at widest point.

## 2012-06-06 LAB — GLUCOSE, CAPILLARY
Glucose-Capillary: 126 mg/dL — ABNORMAL HIGH (ref 70–99)
Glucose-Capillary: 139 mg/dL — ABNORMAL HIGH (ref 70–99)
Glucose-Capillary: 135 mg/dL — ABNORMAL HIGH (ref 70–99)

## 2012-06-06 MED ORDER — PANTOPRAZOLE SODIUM 40 MG PO TBEC: 40.0000 mg | DELAYED_RELEASE_TABLET | Freq: Every day | ORAL | Status: DC

## 2012-06-06 MED ORDER — PANTOPRAZOLE SODIUM 40 MG PO TBEC
40.0000 mg | DELAYED_RELEASE_TABLET | Freq: Every day | ORAL | Status: DC
  Administered 2012-06-06: 40 mg via ORAL
  Filled 2012-06-06 (×2): qty 1

## 2012-06-06 NOTE — Progress Notes (Signed)
Patient ID: Amy [name], female   DOB: 1945/08/26, 66 y.o.   MRN: [id number] Patient ID: Amy [name], female   DOB: Mar 05, 1946, 66 y.o.   MRN: [id number] 6 Days Post-Op  Subjective:  No c/o offered this morning, feeling good, + flatus, No N/V, tolerated diet.  Objective: Vital signs in last 24 hours: Temp:  [98.6 F (37 C)-99.1 F (37.3 C)] 99.1 F (37.3 C) (11/13 0507) Pulse Rate:  [77-99] 99  (11/13 0507) Resp:  [18-20] 18  (11/13 0507) BP: (126-157)/(52-97) 153/93 mmHg (11/13 0507) SpO2:  [100 %] 100 % (11/13 0507) Last BM Date: 06/05/12  Intake/Output from previous day: 11/12 0701 - 11/13 0700 In: 2557.5 [P.O.:840; I.V.:1717.5] Out: 1455 [Urine:1453; Stool:2] Intake/Output this shift: Total I/O In: -  Out: 500 [Urine:500]  General appearance: alert, cooperative, appears stated age and no distress Chest: CTA bilaterally Cardiac: RRR Abdomen: obese, wound vac in place (no output recorded today) non tender, + BS, BM ,Flatus.   Lab Results:   [facility] 06/05/12 0420 06/04/12 0420  WBC 7.2 7.4  HGB 10.9* 11.2*  HCT 34.5* 34.4*  PLT 143* 138*   BMET  Basename 06/05/12 0420 06/04/12 0420  NA 150* 146*  K 3.8 3.0*  CL 110 107  CO2 34* 32  GLUCOSE 139* 128*  BUN 7 8  CREATININE 0.69 0.65  CALCIUM 9.3 9.2   PT/INR No results found for this basename: LABPROT:2,INR:2 in the last 72 hours ABG No results found for this basename: PHART:2,PCO2:2,PO2:2,HCO3:2 in the last 72 hours  Studies/Results: No results found.  Anti-infectives: Anti-infectives     Start     Dose/Rate Route Frequency Ordered Stop   05/31/12 1006   ertapenem (INVANZ) 1 g in sodium chloride 0.9 % 50 mL IVPB        1 g 100 mL/hr over 30 Minutes Intravenous On call to O.R. 05/31/12 1006 05/31/12 1135          Assessment/Plan:  Patient Active Problem List  Diagnosis  . COLONIC POLYPS, ADENOMATOUS  . Lung cancer  . Renal cell cancer  . Incisional hernia  . Colonic mass  s/p  Procedure(s) (LRB) with comments: PARTIAL COLECTOMY (N/A) Advance diet Contine wound vac OOB/Ambulate Probable discharge in am.   LOS: 6 days    Golda Acre [facility] Surgery Pager # [phone]  06/06/2012

## 2012-06-06 NOTE — Progress Notes (Signed)
I have seen and examined the patient and agree with the assessment and plans. Home tomorrow  Riley Lam A. Magnus Ivan  MD, FACS

## 2012-06-07 LAB — GLUCOSE, CAPILLARY: Glucose-Capillary: 127 mg/dL — ABNORMAL HIGH (ref 70–99)

## 2012-06-07 MED ORDER — AMOXICILLIN-POT CLAVULANATE 875-125 MG PO TABS
1.0000 | ORAL_TABLET | Freq: Two times a day (BID) | ORAL | Status: DC
  Administered 2012-06-07: 1 via ORAL
  Filled 2012-06-07 (×2): qty 1

## 2012-06-07 MED ORDER — HYDROCODONE-ACETAMINOPHEN 5-325 MG PO TABS: 1.0000 | ORAL_TABLET | ORAL | Status: DC | PRN

## 2012-06-07 MED ORDER — LORAZEPAM 1 MG PO TABS
1.0000 mg | ORAL_TABLET | Freq: Every evening | ORAL | Status: DC | PRN
  Administered 2012-06-07: 1 mg via ORAL
  Filled 2012-06-07: qty 1

## 2012-06-07 MED ORDER — AMOXICILLIN-POT CLAVULANATE 875-125 MG PO TABS: 1.0000 | ORAL_TABLET | Freq: Two times a day (BID) | ORAL | Status: DC

## 2012-06-07 NOTE — Discharge Summary (Signed)
Physician Discharge Summary  Patient ID: Amy [name] MRN: [id number] DOB/AGE: Mar 23, 1946 66 y.o.  Admit date: 05/31/2012 Discharge date: 06/07/2012  Admission Diagnoses: Incisional hernia  Discharge Diagnoses:  Principal Problem:  *Incisional hernia Active Problems:  Colonic mass   Discharged Condition: stable  Hospital Course: This is a 66 year old female with a history of stage IIIB colon cancer treated with a right hemicolectomy in 1999 who underwent a radical right nephrectomy for a stage II renal cancer at the same time. She subsequently had a stage IA non small cell lung cancer treated with a right thoracotomy and a right lower lobe superior segmentectomy. I saw her and she had an ulcerated sessile firm mass measuring 2 cm in the distal transverse colon at the biopsies were not definitive. She underwent a PET scan prior to this visit which shows her to have no hypermetabolic foci for many the patient's primary and likely some physiologic hypermetabolism about the left adrenal gland and sigmoid colon. She comes in today to discuss the next step which would be surgery.  Patient underwent surgery for the following procedures by dr. Dwain Sarna and Dr. Magnus Ivan assisting on 05/31/12: 1.lysis of adhesions x1 hour  2. transverse colectomy with ileocolonic anastomosis  3. primary repair of incisional hernia  Surgeon: Dr. Harden Mo  Asst.: Dr. Carman Ching Post op the patient 's abdominal wound was managed with negative pressure therapy, today however on exam she had developed a small trac measuring 6cm anterior x 4 cm wide x 6 cm long that drained a small amount of tan foul smelling drainage; the rest of her abdominal wound appears well perfused and is beefy red, and w/o further purulent drainage.   She has remained afebrile and hemodynamically stable during her hospital course. She will be discharged on po Augmentin  for 7 days with follow up with Dr. Dwain Sarna in 1 weeks time. Also  the plan is for her to receive HHN for BID wet to dry dressing changes for at least 2 weeks time and then re-eval for negative pressure wound dressing.  She has been given written care instructions as well as our contact number should she need to contact our office prior to her scheduled appointment time. Review of Systems   Consults: Nutrition and wound care  Significant Diagnostic Studies: labs,microbiology, and radiology.  Treatments: IV hydration, antibiotics,analgesia, respiratory therapy,therapies,procedures,and surgery.  Discharge Exam: Blood pressure 132/82, pulse 104, temperature 98.5 F (36.9 C), temperature source Oral, resp. rate 16, height 5\' 6"  (1.676 m), weight 263 lb 10.7 oz (119.6 kg), SpO2 100.00%. General appearance: alert, cooperative, appears stated age and no distress Wound as per above. Chest: CTA Cardiac: RRR Extremities: + pulses, no edema or tenderness.  Disposition:   Discharge Orders    Future Orders Please Complete By Expires   Discharge patient      Comments:   To home/self care   Patient will receive wound care at home.       Medication List     As of 06/07/2012 11:46 AM    TAKE these medications         ALPRAZolam 1 MG tablet   Commonly known as: XANAX   Take 1 mg by mouth at bedtime as needed. sleep      atorvastatin 20 MG tablet   Commonly known as: LIPITOR   Take 20 mg by mouth daily before breakfast.      calcium-vitamin D 500-200 MG-UNIT per tablet   Take 1 tablet by mouth 2 (  two) times daily with a meal.      hydrochlorothiazide 25 MG tablet   Commonly known as: HYDRODIURIL   Take 1 tablet by mouth daily before breakfast.      HYDROcodone-acetaminophen 5-325 MG per tablet   Commonly known as: NORCO/VICODIN   Take 1-2 tablets by mouth every 4 (four) hours as needed for pain.      Iron 240 (27 FE) MG Tabs   Take 1 tablet by mouth daily.      lisinopril 10 MG tablet   Commonly known as: PRINIVIL,ZESTRIL   Take 10 mg by  mouth daily before breakfast.      metFORMIN 500 MG tablet   Commonly known as: GLUCOPHAGE   500 mg 2 (two) times daily with a meal.      metoprolol 100 MG tablet   Commonly known as: LOPRESSOR   Take 1 tablet by mouth daily before breakfast.      multivitamin with minerals Tabs   Take 1 tablet by mouth daily.      omeprazole 20 MG capsule   Commonly known as: PRILOSEC   Take 20 mg by mouth daily.      PARoxetine 40 MG tablet   Commonly known as: PAXIL   Take 1 tablet by mouth daily before breakfast.      sitaGLIPtin 100 MG tablet   Commonly known as: JANUVIA   Take 100 mg by mouth daily.      traMADol 50 MG tablet   Commonly known as: ULTRAM   Take 1 tablet by mouth as needed. Pain      vitamin C with rose hips 500 MG tablet   Take 500 mg by mouth daily.         SignedBlenda Mounts ACNP [facility] Surgery Pager # [phone]  06/07/2012, 11:46 AM

## 2012-06-07 NOTE — Discharge Instructions (Addendum)
Hernia Repair Care After These instructions give you information on caring for yourself after your procedure. Your doctor may also give you more specific instructions. Call your doctor if you have any problems or questions after your procedure. HOME CARE   You may have changes in your poops (bowel movements).  You may have loose or watery poop (diarrhea).  You may be not able to poop.  Your bowels will slowly get back to normal.  Do not eat any food that makes you sick to your stomach (nauseous). Eat small meals 4 to 6 times a day instead of 3 large ones.  Do not drink pop. It will give you gas.  Do not drink alcohol.  Do not lift anything heavier than 10 pounds. This is about the weight of a gallon of milk.  Do not do anything that makes you very tired for at least 6 weeks.  Do not get your wound wet for 2 days.  You may take a sponge bath during this time.  After 2 days you may take a shower. Gently pat your surgical cut (incision) dry with a towel. Do not rub it.  For men: You may have been given an athletic supporter (scrotal support) before you left the hospital. It holds your scrotum and testicles closer to your body so there is no strain on your wound. Wear the supporter until your doctor tells you that you do not need it anymore. GET HELP RIGHT AWAY IF:  You have watery poop, or cannot poop for more than 3 days.  You feel sick to your stomach or throw up (vomit) more than 2 or 3 times.  You have temperature by mouth above 102 F (38.9 C).  You see redness or puffiness (swelling) around your wound.  You see yellowish white fluid (pus) coming from your wound.  You see a bulge or bump in your lower belly (abdomen) or near your groin.  You develop a rash, trouble breathing, or any other symptoms from medicines taken. MAKE SURE WUX:LKGMWN Repair Care After These instructions give you information on caring for yourself after your procedure. Your doctor may also give  you more specific instructions. Call your doctor if you have any problems or questions after your procedure. HOME CARE   You may have changes in your poops (bowel movements).  You may have loose or watery poop (diarrhea).  You may be not able to poop.  Your bowels will slowly get back to normal.  Do not eat any food that makes you sick to your stomach (nauseous). Eat small meals 4 to 6 times a day instead of 3 large ones.  Do not drink pop. It will give you gas.  Do not drink alcohol.  Do not lift anything heavier than 10 pounds. This is about the weight of a gallon of milk.  Do not do anything that makes you very tired for at least 6 weeks.  Do not get your wound wet for 2 days.  You may take a sponge bath during this time.  After 2 days you may take a shower. Gently pat your surgical cut (incision) dry with a towel. Do not rub it.  For men: You may have been given an athletic supporter (scrotal support) before you left the hospital. It holds your scrotum and testicles closer to your body so there is no strain on your wound. Wear the supporter until your doctor tells you that you do not need it anymore. GET HELP RIGHT AWAY  IF:  You have watery poop, or cannot poop for more than 3 days.  You feel sick to your stomach or throw up (vomit) more than 2 or 3 times.  You have temperature by mouth above 102 F (38.9 C).  You see redness or puffiness (swelling) around your wound.  You see yellowish white fluid (pus) coming from your wound.  You see a bulge or bump in your lower belly (abdomen) or near your groin.  You develop a rash, trouble breathing, or any other symptoms from medicines taken. MAKE SURE YOU:  Understand these instructions.  Will watch your condition.  Will get help right away if your are not doing well or get worse. Document Released: 06/23/2008 Document Revised: 10/03/2011 Document Reviewed: 06/23/2008 [facility] Patient Information 2013 [place],  [place]. Hernia Repair Care After These instructions give you information on caring for yourself after your procedure. Your doctor may also give you more specific instructions. Call your doctor if you have any problems or questions after your procedure. HOME CARE   You may have changes in your poops (bowel movements).  You may have loose or watery poop (diarrhea).  You may be not able to poop.  Your bowels will slowly get back to normal.  Do not eat any food that makes you sick to your stomach (nauseous). Eat small meals 4 to 6 times a day instead of 3 large ones.  Do not drink pop. It will give you gas.  Do not drink alcohol.  Do not lift anything heavier than 10 pounds. This is about the weight of a gallon of milk.  Do not do anything that makes you very tired for at least 6 weeks.  Do not get your wound wet for 2 days.  You may take a sponge bath during this time.  After 2 days you may take a shower. Gently pat your surgical cut (incision) dry with a towel. Do not rub it.  For men: You may have been given an athletic supporter (scrotal support) before you left the hospital. It holds your scrotum and testicles closer to your body so there is no strain on your wound. Wear the supporter until your doctor tells you that you do not need it anymore. GET HELP RIGHT AWAY IF:  You have watery poop, or cannot poop for more than 3 days.  You feel sick to your stomach or throw up (vomit) more than 2 or 3 times.  You have temperature by mouth above 102 F (38.9 C).  You see redness or puffiness (swelling) around your wound.  You see yellowish white fluid (pus) coming from your wound.  You see a bulge or bump in your lower belly (abdomen) or near your groin.  You develop a rash, trouble breathing, or any other symptoms from medicines taken. MAKE SURE YOU:  Understand these instructions.  Will watch your condition.  Will get help right away if your are not doing well or get  worse. Document Released: 06/23/2008 Document Revised: 10/03/2011 Document Reviewed: 06/23/2008 [facility] Patient Information 2013 [place], [place].   Understand these instructions.  Will watch your condition.  Will get help right away if your are not doing well or get worse. Document Released: 06/23/2008 Document Revised: 10/03/2011 Document Reviewed: 06/23/2008 [facility] Patient Information 2013 [place], [place].

## 2012-06-07 NOTE — Discharge Summary (Signed)
I have seen and examined the patient and agree with the assessment and plans.  Akhila Mahnken A. Skyelar Swigart  MD, FACS  

## 2012-06-08 ENCOUNTER — Telehealth (INDEPENDENT_AMBULATORY_CARE_PROVIDER_SITE_OTHER): Payer: Self-pay | Admitting: General Surgery

## 2012-06-08 NOTE — Telephone Encounter (Signed)
Spoke with pt and let her know that her first PO appt will be on 11/19 at 2:10.

## 2012-06-08 NOTE — Telephone Encounter (Signed)
Message copied by Littie Deeds on Fri Jun 08, 2012  3:38 PM ------      Message from: Cathi Roan      Created: Fri Jun 08, 2012 10:38 AM      Regarding: 1st post op       Needs post op for surgery 05/31/12 Dr. Dwain Sarna

## 2012-06-11 ENCOUNTER — Other Ambulatory Visit: Payer: Self-pay | Admitting: Hematology and Oncology

## 2012-06-11 ENCOUNTER — Telehealth: Payer: Self-pay | Admitting: Hematology and Oncology

## 2012-06-11 DIAGNOSIS — C189 Malignant neoplasm of colon, unspecified: Secondary | ICD-10-CM

## 2012-06-11 NOTE — Telephone Encounter (Signed)
S/w pt re appt for 12/10

## 2012-06-12 ENCOUNTER — Telehealth (INDEPENDENT_AMBULATORY_CARE_PROVIDER_SITE_OTHER): Payer: Self-pay

## 2012-06-12 ENCOUNTER — Ambulatory Visit (INDEPENDENT_AMBULATORY_CARE_PROVIDER_SITE_OTHER): Payer: Medicare Other | Admitting: General Surgery

## 2012-06-12 ENCOUNTER — Encounter (INDEPENDENT_AMBULATORY_CARE_PROVIDER_SITE_OTHER): Payer: Self-pay | Admitting: General Surgery

## 2012-06-12 VITALS — BP 130/80 | HR 76 | Temp 94.0°F | Resp 16 | Ht 66.0 in | Wt 256.8 lb

## 2012-06-12 DIAGNOSIS — Z09 Encounter for follow-up examination after completed treatment for conditions other than malignant neoplasm: Secondary | ICD-10-CM

## 2012-06-12 NOTE — Progress Notes (Signed)
Subjective:     Patient ID: Amy [name], female   DOB: 10/18/1945, 66 y.o.   MRN: [id number]  HPI This is a 6 yof who had transverse colon mass that I took to operating room and did open partial colectomy with ileocolonic anastomosis.  The mass was incarcerated in the colon in her ventral hernia.  She did well postoperatively and was discharged home. I did not close her wound at the time of surgery. 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 She returns today having bowel movements and doing very well. She is being without any nausea or vomiting. She really has no significant complaints today. She is undergoing twice daily wet to dry dressing changes of her wound. She has follow up with medical oncology set up for a period  Review of Systems     Objective:   Physical Exam Open wound with no evidence of infection right now    Assessment:     Status post partial colectomy and primary closure of abdominal wall    Plan:     She is doing well. I released her to drive. I will plan on seeing her in 2 weeks we will continue once daily wet to dry dressing changes to her abdominal wound. She will end up needing another hernia from this as I told her preoperatively.

## 2012-06-12 NOTE — Telephone Encounter (Signed)
Pt called requesting Dr Dwain Sarna send order to [facility] that she no longer needs wd vac. [facility] will not pick up wd vac until they receive order from MD. Pt advised I will send msg to Dr Doreen Salvage assistant to get order to pick up wd vac.  [facility] # is [phone].

## 2012-06-13 ENCOUNTER — Telehealth (INDEPENDENT_AMBULATORY_CARE_PROVIDER_SITE_OTHER): Payer: Self-pay | Admitting: General Surgery

## 2012-06-13 NOTE — Telephone Encounter (Signed)
Zella Ball, nurse with [facility], called to get update on dressings.  Pt had OV 06/12/12 with Dr. Dwain Sarna, who noted dressings "once daily wet to dry."  Robin took verbal order for this.  Pt also called to state her insurance would cover home health for dressing changes as long as they were not at her home > 30 hrs/ week.

## 2012-06-14 ENCOUNTER — Other Ambulatory Visit (INDEPENDENT_AMBULATORY_CARE_PROVIDER_SITE_OTHER): Payer: Self-pay | Admitting: General Surgery

## 2012-06-14 DIAGNOSIS — Z9049 Acquired absence of other specified parts of digestive tract: Secondary | ICD-10-CM

## 2012-06-15 ENCOUNTER — Telehealth (INDEPENDENT_AMBULATORY_CARE_PROVIDER_SITE_OTHER): Payer: Self-pay | Admitting: General Surgery

## 2012-06-15 NOTE — Telephone Encounter (Signed)
Zella Ball, nurse with [facility], called to ask if Dr. Dwain Sarna would consider changing wound care to antimicrobial gauze with hydrogel 3x/ week.  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  Her insurance will run out of visits if we continue to do daily wet-to-dry.  Please advise.

## 2012-06-15 NOTE — Telephone Encounter (Signed)
Spoke with Zella Ball and explained that Dr. Dwain Sarna gave the okay to switch to antimicrobial gauze with hydrogel 3x week.  She said she would change the orders.

## 2012-06-29 ENCOUNTER — Encounter (INDEPENDENT_AMBULATORY_CARE_PROVIDER_SITE_OTHER): Payer: Self-pay | Admitting: General Surgery

## 2012-06-29 ENCOUNTER — Ambulatory Visit (INDEPENDENT_AMBULATORY_CARE_PROVIDER_SITE_OTHER): Payer: Medicare Other | Admitting: General Surgery

## 2012-06-29 VITALS — BP 132/72 | HR 70 | Temp 97.4°F | Resp 18 | Ht 66.0 in | Wt 252.2 lb

## 2012-06-29 DIAGNOSIS — Z09 Encounter for follow-up examination after completed treatment for conditions other than malignant neoplasm: Secondary | ICD-10-CM

## 2012-06-29 MED ORDER — HYDROCODONE-ACETAMINOPHEN 5-325 MG PO TABS: 1.0000 | ORAL_TABLET | ORAL | Status: DC | PRN

## 2012-07-01 NOTE — Progress Notes (Signed)
Subjective:     Patient ID: Amy [name], female   DOB: Feb 18, 1946, 66 y.o.   MRN: [id number]  HPI 52 yof s/p transverse colectomy and primary repair of ventral hernia.  She has done well.  She is eating well, having bms, sugars under good control.  She has had some occasional chills but has no fever and no other concerning symptoms.  She continues to do dressing changes on her open wound.  Review of Systems     Objective:   Physical Exam Abdomen soft, nontender, wound open with exudate at base but no infection    Assessment:     S/p colectomy    Plan:     She is due to see Dr. Dalene Carrow next week. Will continue dressing changes, due to smoking/dm/habitus I think it will take a while for this wound to heal I told her again today that I think she will get another hernia at this site I will see back in 3-4 weeks.

## 2012-07-03 ENCOUNTER — Other Ambulatory Visit (HOSPITAL_BASED_OUTPATIENT_CLINIC_OR_DEPARTMENT_OTHER): Payer: Medicare Other | Admitting: Lab

## 2012-07-03 ENCOUNTER — Ambulatory Visit (HOSPITAL_BASED_OUTPATIENT_CLINIC_OR_DEPARTMENT_OTHER): Payer: Medicare Other | Admitting: Hematology and Oncology

## 2012-07-03 ENCOUNTER — Encounter: Payer: Self-pay | Admitting: Hematology and Oncology

## 2012-07-03 ENCOUNTER — Telehealth: Payer: Self-pay | Admitting: Hematology and Oncology

## 2012-07-03 VITALS — BP 139/82 | HR 69 | Temp 97.7°F | Resp 20 | Ht 66.0 in | Wt 252.0 lb

## 2012-07-03 DIAGNOSIS — Z85118 Personal history of other malignant neoplasm of bronchus and lung: Secondary | ICD-10-CM

## 2012-07-03 DIAGNOSIS — F17200 Nicotine dependence, unspecified, uncomplicated: Secondary | ICD-10-CM

## 2012-07-03 DIAGNOSIS — C189 Malignant neoplasm of colon, unspecified: Secondary | ICD-10-CM

## 2012-07-03 DIAGNOSIS — Z85528 Personal history of other malignant neoplasm of kidney: Secondary | ICD-10-CM

## 2012-07-03 DIAGNOSIS — C187 Malignant neoplasm of sigmoid colon: Secondary | ICD-10-CM

## 2012-07-03 LAB — CBC WITH DIFFERENTIAL/PLATELET
Basophils Absolute: 0 10*3/uL (ref 0.0–0.1)
Eosinophils Absolute: 0.1 10*3/uL (ref 0.0–0.5)
HGB: 12.6 g/dL (ref 11.6–15.9)
LYMPH%: 28.8 % (ref 14.0–49.7)
MONO#: 0.4 10*3/uL (ref 0.1–0.9)
NEUT#: 3.3 10*3/uL (ref 1.5–6.5)
Platelets: 171 10*3/uL (ref 145–400)
RBC: 4.33 10*6/uL (ref 3.70–5.45)
RDW: 16.4 % — ABNORMAL HIGH (ref 11.2–14.5)
WBC: 5.4 10*3/uL (ref 3.9–10.3)

## 2012-07-03 LAB — COMPREHENSIVE METABOLIC PANEL (CC13)
ALT: 10 U/L (ref 0–55)
AST: 12 U/L (ref 5–34)
BUN: 14 mg/dL (ref 7.0–26.0)
CO2: 36 mEq/L — ABNORMAL HIGH (ref 22–29)
Glucose: 122 mg/dl — ABNORMAL HIGH (ref 70–99)
Potassium: 3.9 mEq/L (ref 3.5–5.1)
Sodium: 145 mEq/L (ref 136–145)

## 2012-07-03 NOTE — Patient Instructions (Addendum)
YANCY HASCALL  [id number]   [facility] - AFTER VISIT SUMMARY   **RECOMMENDATIONS MADE BY THE CONSULTANT AND ANY TEST    RESULTS WILL BE SENT TO YOUR REFERRING DOCTORS.   YOUR EXAM FINDINGS, LABS AND RESULTS WERE DISCUSSED BY YOUR MD TODAY.  YOU CAN GO TO THE [place] WEB SITE FOR INSTRUCTIONS ON HOW TO ASSESS MY CHART FOR ADDITIONAL INFORMATION AS NEEDED.  Your Updated drug allergies are: Allergies as of 07/03/2012 - Review Complete 07/03/2012  Allergen Reaction Noted  . Iodine Anaphylaxis 04/23/2012  . Iohexol  01/09/2009    Your current list of medications are: Current Outpatient Prescriptions  Medication Sig Dispense Refill  . ALPRAZolam (XANAX) 1 MG tablet Take 1 mg by mouth at bedtime as needed. sleep       . Ascorbic Acid (VITAMIN C WITH ROSE HIPS) 500 MG tablet Take 500 mg by mouth daily.        Marland Kitchen atorvastatin (LIPITOR) 20 MG tablet Take 20 mg by mouth daily before breakfast.       . Calcium Carbonate-Vitamin D (CALCIUM-VITAMIN D) 500-200 MG-UNIT per tablet Take 1 tablet by mouth 2 (two) times daily with a meal.        . Ferrous Gluconate (IRON) 240 (27 FE) MG TABS Take 1 tablet by mouth daily.        . hydrochlorothiazide 25 MG tablet Take 1 tablet by mouth daily before breakfast.       . HYDROcodone-acetaminophen (NORCO/VICODIN) 5-325 MG per tablet Take 1-2 tablets by mouth every 4 (four) hours as needed for pain.  30 tablet  0  . lisinopril (PRINIVIL,ZESTRIL) 10 MG tablet Take 10 mg by mouth daily before breakfast.       . metoprolol (LOPRESSOR) 100 MG tablet Take 1 tablet by mouth daily before breakfast.       . Multiple Vitamin (MULTIVITAMIN WITH MINERALS) TABS Take 1 tablet by mouth daily.      Marland Kitchen omeprazole (PRILOSEC) 20 MG capsule Take 20 mg by mouth daily.        Marland Kitchen PARoxetine (PAXIL) 40 MG tablet Take 1 tablet by mouth daily before breakfast.       . sitaGLIPtin (JANUVIA) 100 MG tablet Take 100 mg by mouth daily.      . traMADol (ULTRAM) 50 MG tablet  Take 1 tablet by mouth as needed. Pain         INSTRUCTIONS GIVEN AND DISCUSSED:  See attached schedule   SPECIAL INSTRUCTIONS/FOLLOW-UP:  See above.  I acknowledge that I have been informed and understand all the instructions given to me and received a copy.I know to contact the clinic, my physician, or go to the emergency Department if any problems should occur.   I do not have any more questions at this time, but understand that I may call the [facility] at [phone] during business hours should I have any further questions or need assistance in obtaining follow-up care.

## 2012-07-03 NOTE — Progress Notes (Signed)
This office note has been dictated.

## 2012-07-03 NOTE — Telephone Encounter (Signed)
Gave pt appt for January 2014 lab only and on April 2014 lab and MD

## 2012-07-04 NOTE — Progress Notes (Signed)
CC:   Amy [name]. Polite, M.D. Amy Gosling, MD Amy [name]. Amy Dar, MD, Amy [name]  IDENTIFYING STATEMENT:  The patient is a 66 year old woman with multiple cancers who presents for followup.  INTERVAL HISTORY:  To summarize, the patient has history of polyposis and a recent colonoscopy on 04/02/2012 had shown an ulcerated sessile polyp tumor/mass in the distal transverse colon.  She underwent a laparoscopic partial colectomy on 05/31/2012.  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  There was no evidence of lymphovascular or perineural invasion.  Margins were negative.  None of the 9 lymph nodes sampled had evidence of tumor.  The tumor was staged as a pT1 N0.  Microsatellite instability was high.  The patient reports that she has recovered well from surgery.  She is moving her bowels.  She has no current concerns.  MEDICATIONS:  Reviewed and updated.  ALLERGIES:  Iodine.  PAST MEDICAL HISTORY/FAMILY HISTORY/SOCIAL HISTORY:  Unchanged.  REVIEW OF SYSTEMS:  A 10-point review of systems negative.  PHYSICAL EXAM:  General:  Patient is alert, oriented x3.  Vitals:  Pulse 69, blood pressure 139/82, temperature 97.7, respirations 20, weight 252 pounds.  HEENT:  Head is atraumatic.  Mouth moist.  Chest:  Clear.  CVS: Unremarkable.  Abdomen:  Obese, soft, nontender.  Bowel sounds present. Extremities:  No edema.  LAB DATA:  07/03/2012 white cell count 5.4, hemoglobin 12.6, hematocrit 38.4, platelets 171.  CEA and CMET pending.  IMPRESSION AND PLAN:  Amy [name] is a 66 year old woman with history of multiple malignancies.  1. 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     She also had node-positive adenocarcinoma of the sigmoid colon, T3     N1,  in 1999 and received 6 months of adjuvant 5-FU, leucovorin. 2. Status post right nephrectomy for stage II renal cell carcinoma.     No adjuvant therapy required. 3. Status post right video-assisted thoracoscopic surgery with right     superior segmentectomy in 2001 for stage I non small cell lung     cancer.  No adjuvant therapy required.  The patient has undergone staging CTs that have indicated no evidence of recurrence.  So, with this said, she will continue surveillance.  It is very important that she keep undergoing frequent colonoscopies as dictated by Dr. Russella [name]. I have encouraged her to stop smoking.  I also encouraged genetic testing, but she declined.  She is to exercise and eat high-fiber, low- fat diet.  She follows up in 4 months' time.    ______________________________ Amy [name], M.D. LIO/MEDQ  D:  07/03/2012  T:  07/04/2012  Job:  [id number]

## 2012-07-11 ENCOUNTER — Other Ambulatory Visit: Payer: Self-pay | Admitting: Internal Medicine

## 2012-07-11 DIAGNOSIS — Z1231 Encounter for screening mammogram for malignant neoplasm of breast: Secondary | ICD-10-CM

## 2012-07-20 ENCOUNTER — Telehealth (INDEPENDENT_AMBULATORY_CARE_PROVIDER_SITE_OTHER): Payer: Self-pay | Admitting: General Surgery

## 2012-07-20 NOTE — Telephone Encounter (Signed)
Amy [name] [facility] nurse called wanting continuing wound care orders for patient..hydrogel gauze Monday, Wed, and Friday...please call nurse Ashely Merilynn Finland if continuing wound care orders are ok..[phone]

## 2012-07-24 ENCOUNTER — Telehealth (INDEPENDENT_AMBULATORY_CARE_PROVIDER_SITE_OTHER): Payer: Self-pay

## 2012-07-24 NOTE — Telephone Encounter (Signed)
Amy [name] at [facility] called stating they will do a continuation order for wd care as previously written by Dr Dwain Sarna until next Friday when Dr Dwain Sarna can sign a new order. They will fax order to be signed.

## 2012-07-24 NOTE — Telephone Encounter (Signed)
Dr Dwain Sarna called and states ok to continue wound orders for [facility].

## 2012-07-25 DIAGNOSIS — Z853 Personal history of malignant neoplasm of breast: Secondary | ICD-10-CM

## 2012-07-25 HISTORY — DX: Personal history of malignant neoplasm of breast: Z85.3

## 2012-07-30 ENCOUNTER — Encounter (INDEPENDENT_AMBULATORY_CARE_PROVIDER_SITE_OTHER): Payer: Self-pay | Admitting: General Surgery

## 2012-07-30 ENCOUNTER — Ambulatory Visit (INDEPENDENT_AMBULATORY_CARE_PROVIDER_SITE_OTHER): Payer: Medicare Other | Admitting: General Surgery

## 2012-07-30 VITALS — BP 140/76 | HR 80 | Resp 20 | Ht 66.0 in | Wt 248.0 lb

## 2012-07-30 DIAGNOSIS — Z09 Encounter for follow-up examination after completed treatment for conditions other than malignant neoplasm: Secondary | ICD-10-CM

## 2012-07-30 NOTE — Progress Notes (Signed)
Subjective:     Patient ID: Amy [name], female   DOB: Apr 29, 1946, 67 y.o.   MRN: [id number]  HPI 76 yof s/p transverse colectomy and primary repair of ventral hernia. She has done well. She is eating well, having bms, sugars under good control. She has had some occasional chills but has no fever and no other concerning symptoms. She continues to do dressing changes on her open wound with home health. She has followed up with med onc. She returns today for follow up for her healing wound   Review of Systems     Objective:   Physical Exam Healing wound with no infection. The open area has closed down is about 6 cm in length and about 4 cm deep now    Assessment:     Status post transverse colectomy Open wound    Plan:     I think this should heal over the next number of weeks. We then continued her dressing changes I will see her back in 6 weeks. I told her that her medical oncologist is no longer at the cancer center and I will see who she is going to followup if she is not been made aware of any of this year.

## 2012-07-31 ENCOUNTER — Telehealth: Payer: Self-pay | Admitting: Oncology

## 2012-07-31 NOTE — Telephone Encounter (Signed)
LVOM for pt to return call in re to appt change. Calendar mailed.

## 2012-08-02 ENCOUNTER — Other Ambulatory Visit (HOSPITAL_BASED_OUTPATIENT_CLINIC_OR_DEPARTMENT_OTHER): Payer: Medicare Other | Admitting: Lab

## 2012-08-02 DIAGNOSIS — C187 Malignant neoplasm of sigmoid colon: Secondary | ICD-10-CM

## 2012-08-02 DIAGNOSIS — C189 Malignant neoplasm of colon, unspecified: Secondary | ICD-10-CM

## 2012-08-02 LAB — COMPREHENSIVE METABOLIC PANEL (CC13)
Albumin: 3.5 g/dL (ref 3.5–5.0)
BUN: 19 mg/dL (ref 7.0–26.0)
CO2: 31 mEq/L — ABNORMAL HIGH (ref 22–29)
Calcium: 9.9 mg/dL (ref 8.4–10.4)
Chloride: 101 mEq/L (ref 98–107)
Creatinine: 1 mg/dL (ref 0.6–1.1)
Glucose: 141 mg/dl — ABNORMAL HIGH (ref 70–99)

## 2012-08-03 ENCOUNTER — Telehealth (INDEPENDENT_AMBULATORY_CARE_PROVIDER_SITE_OTHER): Payer: Self-pay | Admitting: General Surgery

## 2012-08-03 NOTE — Telephone Encounter (Signed)
Zella Ball, nurse with [facility], called to ask for verbal order to extend services while wound is healing.  VO given and she will FAX orders for signature.

## 2012-08-21 ENCOUNTER — Ambulatory Visit: Payer: Medicare Other

## 2012-08-27 ENCOUNTER — Encounter: Payer: Self-pay | Admitting: Obstetrics and Gynecology

## 2012-08-30 ENCOUNTER — Encounter (INDEPENDENT_AMBULATORY_CARE_PROVIDER_SITE_OTHER): Payer: Self-pay | Admitting: General Surgery

## 2012-08-30 ENCOUNTER — Ambulatory Visit (INDEPENDENT_AMBULATORY_CARE_PROVIDER_SITE_OTHER): Payer: Medicare Other | Admitting: General Surgery

## 2012-08-30 VITALS — BP 140/82 | HR 68 | Temp 97.2°F | Resp 20 | Ht 66.0 in | Wt 247.0 lb

## 2012-08-30 DIAGNOSIS — Z09 Encounter for follow-up examination after completed treatment for conditions other than malignant neoplasm: Secondary | ICD-10-CM

## 2012-08-30 NOTE — Progress Notes (Signed)
Subjective:     Patient ID: Amy [name], female   DOB: 05/31/1946, 67 y.o.   MRN: [id number]  HPI 71 yof s/p transverse colectomy and primary repair of ventral hernia. She is doing well. She is eating well, having bms. She has no complaints.She continues to do dressing changes on her open wound with home health.  She returns today for follow up for her healing wound Her wound is much improved today  Review of Systems     Objective:   Physical Exam Healing midline wound, there is a 3x1 cm very shallow open wound with pink granulation tissue, nontender    Assessment:     S/p colectomy    Plan:     She is doing very well. She can resume any activity. She is due to see Dr. Truett Perna in followup for medical oncology. She's going to continue her home health for a while but I think it a couple of weeks she should be able to be done with this. I asked her to call me in about 4 weeks if her wound is not completely healed at that point.

## 2012-08-30 NOTE — Progress Notes (Deleted)
Subjective:     Patient ID: Amy [name], female   DOB: 1945-10-23, 67 y.o.   MRN: [id number]  HPI   Review of Systems     Objective:   Physical Exam     Assessment:     ***    Plan:     ***

## 2012-09-07 ENCOUNTER — Encounter: Payer: Self-pay | Admitting: Oncology

## 2012-09-18 ENCOUNTER — Ambulatory Visit
Admission: RE | Admit: 2012-09-18 | Discharge: 2012-09-18 | Disposition: A | Discharge status: Home / Self Care | Payer: Medicare Other | Source: Ambulatory Visit | Attending: Internal Medicine | Admitting: Internal Medicine

## 2012-09-18 LAB — HM MAMMOGRAPHY

## 2012-09-19 ENCOUNTER — Other Ambulatory Visit: Payer: Self-pay | Admitting: Internal Medicine

## 2012-09-19 DIAGNOSIS — R928 Other abnormal and inconclusive findings on diagnostic imaging of breast: Secondary | ICD-10-CM

## 2012-10-02 ENCOUNTER — Other Ambulatory Visit: Payer: Self-pay | Admitting: Internal Medicine

## 2012-10-02 ENCOUNTER — Ambulatory Visit
Admission: RE | Admit: 2012-10-02 | Discharge: 2012-10-02 | Disposition: A | Discharge status: Home / Self Care | Payer: Medicare Other | Source: Ambulatory Visit | Attending: Internal Medicine | Admitting: Internal Medicine

## 2012-10-02 DIAGNOSIS — R921 Mammographic calcification found on diagnostic imaging of breast: Secondary | ICD-10-CM

## 2012-10-02 DIAGNOSIS — R928 Other abnormal and inconclusive findings on diagnostic imaging of breast: Secondary | ICD-10-CM

## 2012-10-03 ENCOUNTER — Ambulatory Visit
Admission: RE | Admit: 2012-10-03 | Discharge: 2012-10-03 | Disposition: A | Discharge status: Home / Self Care | Payer: Medicare Other | Source: Ambulatory Visit | Attending: Internal Medicine | Admitting: Internal Medicine

## 2012-10-03 DIAGNOSIS — R921 Mammographic calcification found on diagnostic imaging of breast: Secondary | ICD-10-CM

## 2012-10-03 DIAGNOSIS — C50911 Malignant neoplasm of unspecified site of right female breast: Secondary | ICD-10-CM

## 2012-10-03 HISTORY — PX: BREAST BIOPSY: SHX20

## 2012-10-03 HISTORY — DX: Malignant neoplasm of unspecified site of right female breast: C50.911

## 2012-10-04 ENCOUNTER — Other Ambulatory Visit: Payer: Self-pay | Admitting: Internal Medicine

## 2012-10-04 DIAGNOSIS — C50911 Malignant neoplasm of unspecified site of right female breast: Secondary | ICD-10-CM

## 2012-10-09 ENCOUNTER — Encounter (INDEPENDENT_AMBULATORY_CARE_PROVIDER_SITE_OTHER): Payer: Medicare Other | Admitting: General Surgery

## 2012-10-09 ENCOUNTER — Other Ambulatory Visit: Payer: Medicare Other

## 2012-10-12 ENCOUNTER — Other Ambulatory Visit: Payer: Self-pay | Admitting: Internal Medicine

## 2012-10-12 ENCOUNTER — Ambulatory Visit
Admission: RE | Admit: 2012-10-12 | Discharge: 2012-10-12 | Disposition: A | Discharge status: Home / Self Care | Payer: Medicare Other | Source: Ambulatory Visit | Attending: Internal Medicine | Admitting: Internal Medicine

## 2012-10-12 ENCOUNTER — Ambulatory Visit: Payer: Self-pay | Admitting: Obstetrics and Gynecology

## 2012-10-12 DIAGNOSIS — R928 Other abnormal and inconclusive findings on diagnostic imaging of breast: Secondary | ICD-10-CM

## 2012-10-12 DIAGNOSIS — C50911 Malignant neoplasm of unspecified site of right female breast: Secondary | ICD-10-CM

## 2012-10-12 MED ORDER — GADOBENATE DIMEGLUMINE 529 MG/ML IV SOLN
20.0000 mL | Freq: Once | INTRAVENOUS | Status: AC | PRN
  Administered 2012-10-12: 20 mL via INTRAVENOUS

## 2012-10-17 ENCOUNTER — Ambulatory Visit
Admission: RE | Admit: 2012-10-17 | Discharge: 2012-10-17 | Disposition: A | Discharge status: Home / Self Care | Payer: Medicare Other | Source: Ambulatory Visit | Attending: Internal Medicine | Admitting: Internal Medicine

## 2012-10-17 ENCOUNTER — Other Ambulatory Visit: Payer: Self-pay | Admitting: Internal Medicine

## 2012-10-17 DIAGNOSIS — R928 Other abnormal and inconclusive findings on diagnostic imaging of breast: Secondary | ICD-10-CM

## 2012-10-18 ENCOUNTER — Telehealth (INDEPENDENT_AMBULATORY_CARE_PROVIDER_SITE_OTHER): Payer: Self-pay

## 2012-10-18 ENCOUNTER — Ambulatory Visit (INDEPENDENT_AMBULATORY_CARE_PROVIDER_SITE_OTHER): Payer: Medicare Other | Admitting: General Surgery

## 2012-10-18 ENCOUNTER — Encounter (INDEPENDENT_AMBULATORY_CARE_PROVIDER_SITE_OTHER): Payer: Self-pay | Admitting: General Surgery

## 2012-10-18 VITALS — BP 132/72 | HR 84 | Resp 18 | Ht 66.0 in | Wt 246.0 lb

## 2012-10-18 DIAGNOSIS — D0590 Unspecified type of carcinoma in situ of unspecified breast: Secondary | ICD-10-CM

## 2012-10-18 DIAGNOSIS — D0511 Intraductal carcinoma in situ of right breast: Secondary | ICD-10-CM

## 2012-10-18 NOTE — Telephone Encounter (Signed)
LMOM at home and cell. I have made her an appt with Dr Michell Heinrich for 10/19/12 arrive at 2:45. I am still working on the referral for Dr Truett Perna so I will call her back once I have that appt.

## 2012-10-18 NOTE — Progress Notes (Signed)
Patient ID: Sharman Crate, female   DOB: 10-30-1945, 67 y.o.   MRN: [id number]  Chief Complaint  Patient presents with  . Other    Eval new breast cancer    HPI Amy [name] is a 67 y.o. female.   HPI 8 yof who I know well from recent partial colectomy. After this she got her screening mammogram which showed right sided calcifications. 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  ER/PR positive at 100%.  She then had MR which showed postbiopsy changes as well as 3x1.4x0.8 cm right sided breast mass.  This underwent US guided biopsy that showed intraductal papilloma and has been recommended for excision. She has no complaints referable to her breasts and comes in today to discuss treatment.  She is still doing well from colectomy.  Past Medical History  Diagnosis Date  . Depression   . Diabetes mellitus     diet controlled  . Anemia   . Anxiety   . Arthritis   . Hypertension   . Hernia     Near umbilicus  . Hemorrhoids   . Heart murmur   . Cancer     colon,renal,lung  . Sleep apnea     mild no cpap need has been tested in the pass score was 11  . Breast cancer, right breast     Past Surgical History  Procedure Laterality Date  . Colonoscopy    . Polypectomy    . Lung lobectomy      right lower  . Colon surgery      chemo  . Kidney surgery  1999    right/chemo kidney removed for cancer  . Partial hysterectomy    . Breast biopsy      Bilateral cysts/benign  . Colectomy  05-31-2012    laparoscopic ,possible open  . Abdominal hysterectomy  80's  . Tonsillectomy      as child  . Partial colectomy  05/31/2012    Procedure: PARTIAL COLECTOMY;  Surgeon: Emelia Loron, MD;  Location: [facility];  Service: General;  Laterality: N/A;    Family History  Problem Relation Age of Onset  . Colon polyps Brother   . Breast cancer Mother   . Cancer Mother     breast  . Heart disease Father   . Colon cancer Neg Hx   . Esophageal cancer Neg Hx   . Rectal cancer  Neg Hx   . Stomach cancer Neg Hx     Social History History  Substance Use Topics  . Smoking status: Current Every Day Smoker -- 1.00 packs/day for 18 years    Types: Cigarettes  . Smokeless tobacco: Never Used  . Alcohol Use: No    Allergies  Allergen Reactions  . Iodine Anaphylaxis  . Iohexol      Code: HIVES, Desc: pt states , swelling mouth , throat, and has difficulty breathing     Current Outpatient Prescriptions  Medication Sig Dispense Refill  . ALPRAZolam (XANAX) 1 MG tablet Take 1 mg by mouth at bedtime as needed. sleep       . Ascorbic Acid (VITAMIN C WITH ROSE HIPS) 500 MG tablet Take 500 mg by mouth daily.        Marland Kitchen atorvastatin (LIPITOR) 20 MG tablet Take 20 mg by mouth daily before breakfast.       . Calcium Carbonate-Vitamin D (CALCIUM-VITAMIN D) 500-200 MG-UNIT per tablet Take 1 tablet by mouth 2 (two) times daily with a meal.        .  Ezetimibe (ZETIA PO) Take by mouth.      . Ferrous Gluconate (IRON) 240 (27 FE) MG TABS Take 1 tablet by mouth daily.        . hydrochlorothiazide 25 MG tablet Take 1 tablet by mouth daily before breakfast.       . HYDROcodone-acetaminophen (NORCO/VICODIN) 5-325 MG per tablet Take 1-2 tablets by mouth every 4 (four) hours as needed for pain.  30 tablet  0  . lisinopril (PRINIVIL,ZESTRIL) 10 MG tablet Take 10 mg by mouth daily before breakfast.       . metoprolol (LOPRESSOR) 100 MG tablet Take 1 tablet by mouth daily before breakfast.       . Multiple Vitamin (MULTIVITAMIN WITH MINERALS) TABS Take 1 tablet by mouth daily.      Marland Kitchen omeprazole (PRILOSEC) 20 MG capsule Take 20 mg by mouth daily.        Marland Kitchen PARoxetine (PAXIL) 40 MG tablet Take 1 tablet by mouth daily before breakfast.       . sitaGLIPtin (JANUVIA) 100 MG tablet Take 100 mg by mouth daily.      . traMADol (ULTRAM) 50 MG tablet Take 1 tablet by mouth as needed. Pain       No current facility-administered medications for this visit.    Review of Systems Review of  Systems  Blood pressure 132/72, pulse 84, resp. rate 18, height 5\' 6"  (1.676 m), weight 246 lb (111.585 kg).  Physical Exam Physical Exam  Vitals reviewed. Constitutional: She appears well-developed and well-nourished.  Pulmonary/Chest: Right breast exhibits no inverted nipple, no mass, no nipple discharge, no skin change and no tenderness. Left breast exhibits no inverted nipple, no mass, no nipple discharge, no skin change and no tenderness. Breasts are symmetrical.    Lymphadenopathy:    She has no cervical adenopathy.    She has no axillary adenopathy.       Right: No supraclavicular adenopathy present.       Left: No supraclavicular adenopathy present.    Data Reviewed DIGITAL DIAGNOSTIC RIGHT MAMMOGRAM  Comparison: 09/19/2012, 07/29/2011, dating back to 05/30/2006.  Findings:  ACR Breast Density Category 2: There is a scattered fibroglandular  pattern.  Spot magnification CC and MLO views of the calcifications in the  upper outer right breast, anterior third, were obtained. There is  a cluster of calcifications of varying shapes and sizes which span  approximately 2.1 cm in a linear distribution.  IMPRESSION:  Suspicious microcalcifications in the upper outer right breast,  anterior third.  RECOMMENDATION:  Stereotactic core needle biopsy of these calcifications was  discussed with the patient. She has agreed to proceed. This has  been scheduled for tomorrow, March 12, at 2 o'clock p.m.  Depending on the results of the biopsy, the patient's nipple  discharge may require further imaging workup with ductography.  I have discussed the findings and recommendations with the patient.  Results were also provided in writing at the conclusion of the  visit.  BI-RADS CATEGORY 4: Suspicious abnormality - biopsy should be  considered.   Assessment    Right breast DCIS Right breast papilloma    Plan    We discussed the staging and pathophysiology of breast cancer. We  discussed all of the different options for treatment for breast cancer including surgery, chemotherapy, radiation therapy, Herceptin, and antiestrogen therapy.   We discussed a sentinel lymph node biopsy as she does not appear to having lymph node involvement right now. I think this might be reasonable  to omit if agreed to by med onc and rad onc. There certainly is possibility that she could have invasive disease and could decide later.  That would mean a second operation but I think with her cormorbidities she has decent chance of having upper extremity issues postop. Will plan on waiting on this.  We discussed the options for treatment of the breast cancer which included lumpectomy versus a mastectomy. We discussed the performance of the lumpectomy with a wire placement. We discussed a 10-20% chance of a positive margin requiring reexcision in the operating room. We also discussed that she may need radiation therapy or antiestrogen therapy or both if she undergoes lumpectomy. We discussed the mastectomy and the postoperative care for that as well. We discussed that there is no difference in her survival whether she undergoes lumpectomy with radiation therapy or antiestrogen therapy versus a mastectomy. There is a slight difference in the local recurrence rate being 3-5% with lumpectomy and about 1% with a mastectomy. We discussed the risks of operation including bleeding, infection, possible reoperation. She understands her further therapy will be based on what her stages at the time of her operation. She does not need a mastectomy.  The dcis can be treated with lumpectomy and the papilloma has been recommended for excision. Her breast size is such that this could be done easily.  The issues is xrt after this given continued smoking and prior lung cancer.  She will see Dr Michell Heinrich.  She will also see med onc.  I think the most reasonable plan for her would be lumpectomy, papilloma excision and then review  path.  Hopefully she could just go on antiestrogen after that. She also has genetics appt.  I will see her soon again.         Delmo Matty 10/18/2012, 3:01 PM

## 2012-10-18 NOTE — Telephone Encounter (Signed)
LM  For Clydie Braun to call me about getting pt seen 10/19/12 by Dr Michell Heinrich. Dr Dwain Sarna text Dr Michell Heinrich about seeing this pt and she said she would see her tomorrow afternoon but I didn't know what time so I need to speak to Clydie Braun about the referral that I have in epic.

## 2012-10-19 ENCOUNTER — Telehealth (INDEPENDENT_AMBULATORY_CARE_PROVIDER_SITE_OTHER): Payer: Self-pay

## 2012-10-19 ENCOUNTER — Encounter: Payer: Self-pay | Admitting: Radiation Oncology

## 2012-10-19 ENCOUNTER — Telehealth: Payer: Self-pay | Admitting: *Deleted

## 2012-10-19 ENCOUNTER — Encounter: Payer: Self-pay | Admitting: *Deleted

## 2012-10-19 ENCOUNTER — Ambulatory Visit
Admission: RE | Admit: 2012-10-19 | Discharge: 2012-10-19 | Disposition: A | Discharge status: Home / Self Care | Payer: Medicare Other | Source: Ambulatory Visit | Attending: Radiation Oncology | Admitting: Radiation Oncology

## 2012-10-19 VITALS — BP 132/84 | HR 74 | Temp 97.5°F | Resp 20 | Wt 249.3 lb

## 2012-10-19 DIAGNOSIS — G4730 Sleep apnea, unspecified: Secondary | ICD-10-CM | POA: Insufficient documentation

## 2012-10-19 DIAGNOSIS — C50411 Malignant neoplasm of upper-outer quadrant of right female breast: Secondary | ICD-10-CM | POA: Insufficient documentation

## 2012-10-19 DIAGNOSIS — Z79899 Other long term (current) drug therapy: Secondary | ICD-10-CM | POA: Insufficient documentation

## 2012-10-19 DIAGNOSIS — Z803 Family history of malignant neoplasm of breast: Secondary | ICD-10-CM | POA: Insufficient documentation

## 2012-10-19 DIAGNOSIS — C50911 Malignant neoplasm of unspecified site of right female breast: Secondary | ICD-10-CM

## 2012-10-19 DIAGNOSIS — Z9221 Personal history of antineoplastic chemotherapy: Secondary | ICD-10-CM | POA: Insufficient documentation

## 2012-10-19 DIAGNOSIS — D249 Benign neoplasm of unspecified breast: Secondary | ICD-10-CM | POA: Insufficient documentation

## 2012-10-19 DIAGNOSIS — C189 Malignant neoplasm of colon, unspecified: Secondary | ICD-10-CM | POA: Insufficient documentation

## 2012-10-19 DIAGNOSIS — I10 Essential (primary) hypertension: Secondary | ICD-10-CM | POA: Insufficient documentation

## 2012-10-19 DIAGNOSIS — F17200 Nicotine dependence, unspecified, uncomplicated: Secondary | ICD-10-CM | POA: Insufficient documentation

## 2012-10-19 DIAGNOSIS — Z85038 Personal history of other malignant neoplasm of large intestine: Secondary | ICD-10-CM | POA: Insufficient documentation

## 2012-10-19 DIAGNOSIS — E119 Type 2 diabetes mellitus without complications: Secondary | ICD-10-CM | POA: Insufficient documentation

## 2012-10-19 DIAGNOSIS — Z9049 Acquired absence of other specified parts of digestive tract: Secondary | ICD-10-CM | POA: Insufficient documentation

## 2012-10-19 DIAGNOSIS — D0590 Unspecified type of carcinoma in situ of unspecified breast: Secondary | ICD-10-CM | POA: Insufficient documentation

## 2012-10-19 DIAGNOSIS — Z800 Family history of malignant neoplasm of digestive organs: Secondary | ICD-10-CM | POA: Insufficient documentation

## 2012-10-19 DIAGNOSIS — Z170 Estrogen receptor positive status [ER+]: Secondary | ICD-10-CM | POA: Insufficient documentation

## 2012-10-19 DIAGNOSIS — Z85118 Personal history of other malignant neoplasm of bronchus and lung: Secondary | ICD-10-CM | POA: Insufficient documentation

## 2012-10-19 HISTORY — DX: Malignant neoplasm of unspecified part of unspecified bronchus or lung: C34.90

## 2012-10-19 HISTORY — DX: Malignant neoplasm of colon, unspecified: C18.9

## 2012-10-19 NOTE — Addendum Note (Signed)
Encounter addended by: Stefannie Defeo Mintz Valeri Sula, RN on: 10/19/2012  6:19 PM<BR>     Documentation filed: Charges VN

## 2012-10-19 NOTE — Progress Notes (Addendum)
Pt denies pain, fatigue, loss of appetite. She circled 9/10 on distress screening. She states that she "has been to dr every day this week, and once she slows down she feels she'll be fine". Pt states she would like SW to contact her by phone next week to FU. Left vm for A Elmore, CSW w/pt's request for FU call next week. Pt lives alone, no living children. Her brother lives close to her. Pt used to work for [employer].

## 2012-10-19 NOTE — Telephone Encounter (Signed)
LMOM at home and cell. I need to make sure the pt got her message from last night that the pt has an appt with Dr Michell Heinrich today at [facility] at 2:45. I am still working on getting the medical oncology appt.

## 2012-10-19 NOTE — Telephone Encounter (Signed)
Confirmed 10/29/12 genetic appt w/ pt.  Emailed Drs. Karsten Fells and [place] to make them aware.

## 2012-10-19 NOTE — Progress Notes (Signed)
Please see the Nurse Progress Note in the MD Initial Consult Encounter for this patient. 

## 2012-10-19 NOTE — Progress Notes (Signed)
Per Dawn - Dr. Dwain Sarna wanted this pt to be seen sooner and Dr. Truett Perna was not able to and Dr. Welton Flakes could.  We cancelled her appts w/ Dr. Truett Perna and made appts w/ Dr. Welton Flakes.  Took paperwork to Dr. Michell Heinrich and pt since she was here.  Emailed Drs. Truett Perna and Pence to make them aware.

## 2012-10-19 NOTE — Progress Notes (Signed)
Radiation Oncology         [phone]) [phone] ________________________________  Initial outpatient Consultation - Date: 10/19/2012   Name: Amy [name] MRN: [id number]   DOB: July 09, 1946  REFERRING PHYSICIAN: Emelia Loron, MD  DIAGNOSIS: The encounter diagnosis was Breast cancer, right breast.  HISTORY OF PRESENT ILLNESS::Amy [name] is a 67 y.o. female  whose cancer history began in 1999 when she was diagnosed with a IIIB colon cancer. She underwent a colectomy followed by 6 months of chemotherapy under the care of Dr. Cleone Slim. She had a T2 N0 renal cell carcinoma the right kidney in about that same time. In November 2001 she had a T1 N0 non-small cell lung cancer removed from the right lung with a VATS procedure by Dr. Edwyna Shell. She has been monitored with serial colonoscopies and has had several adenomas removed. She was seen at the [facility] genetics clinic but was not tested that I can see. She says she is in some sort of study looking at her and as well as the rest of her family due to the high rate of cancers there. A colonoscopy in September of last year showed an ulcerative mass in the transverse colon. Biopsy revealed polyps that were negative for dysplasia. Surgical excision was recommended. 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 Margins were negative. 0/9 lymph nodes were positive. No adjuvant chemotherapy was recommended. She then underwent her screening mammogram in March of this year. New calcifications were noted. 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 This was ER positive at 100% PR positive at 100%. Followup MRI revealed with artifact as well as a 3.2 x 1.4 x 0.8 cm mass in the upper and her region of the right breast. This had features suspicious for a  papillary neoplasm. Ultrasound is recommended. 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 She met with Dr. Dwain Sarna and discussed surgical options. They discussed mastectomy versus lumpectomy. She did have difficulties with wound healing after her colectomy and actually still has a bandage in place for this. She has been on disability since 2000 due to her multiple medical problems as well as her multiple cancers. She still has some pain from her biopsy. She is taking tramadol every other day to treat this. She is scheduled to see Dr. Welton Flakes on April 4.  PREVIOUS RADIATION THERAPY: No  PAST MEDICAL HISTORY:  has a past medical history of Depression; Diabetes mellitus; Anemia; Anxiety; Arthritis; Hypertension; Hernia; Hemorrhoids; Heart murmur; Cancer; Sleep apnea; Lung cancer (2001); Colon cancer (1999, 2013); and Breast cancer, right breast (10/03/12).    PAST SURGICAL HISTORY: Past Surgical History  Procedure Laterality Date  . Colonoscopy    . Polypectomy    . Lung lobectomy      right lower  . Colon surgery      chemo  . Kidney surgery  1999    right/chemo kidney removed for cancer  . Partial hysterectomy    . Breast biopsy      Bilateral cysts/benign  . Colectomy  05-31-2012    laparoscopic ,possible open  . Abdominal hysterectomy  80's    fibroids  . Tonsillectomy      as child  . Partial colectomy  05/31/2012    Procedure: PARTIAL COLECTOMY;  Surgeon: Emelia Loron, MD;  Location: [facility]S;  Service: General;  Laterality: N/A;  . Breast biopsy Right 10/03/12    ER/PR +    FAMILY HISTORY: family history includes Breast cancer in her mother; Cancer in her brother and mother; Colon polyps in her brother; and Heart disease in her father.  There is no history of Colon cancer, and Esophageal cancer, and Rectal cancer, and Stomach cancer, .  SOCIAL HISTORY:  History  Substance Use Topics  . Smoking status: Current Every Day Smoker -- 1.00 packs/day for  18 years    Types: Cigarettes  . Smokeless tobacco: Never Used  . Alcohol Use: No    ALLERGIES: Iodine and Iohexol  MEDICATIONS:  Current Outpatient Prescriptions  Medication Sig Dispense Refill  . ALPRAZolam (XANAX) 1 MG tablet Take 1 mg by mouth at bedtime as needed. sleep       . Ascorbic Acid (VITAMIN C WITH ROSE HIPS) 500 MG tablet Take 500 mg by mouth daily.        Marland Kitchen atorvastatin (LIPITOR) 20 MG tablet Take 20 mg by mouth daily before breakfast.       . Calcium Carbonate-Vitamin D (CALCIUM-VITAMIN D) 500-200 MG-UNIT per tablet Take 1 tablet by mouth 2 (two) times daily with a meal.        . Ezetimibe (ZETIA PO) Take by mouth.      . Ferrous Gluconate (IRON) 240 (27 FE) MG TABS Take 1 tablet by mouth daily.        . hydrochlorothiazide 25 MG tablet Take 1 tablet by mouth daily before breakfast.       . HYDROcodone-acetaminophen (NORCO/VICODIN) 5-325 MG per tablet Take 1-2 tablets by mouth every 4 (four) hours as needed for pain.  30 tablet  0  . lisinopril (PRINIVIL,ZESTRIL) 10 MG tablet Take 10 mg by mouth daily before breakfast.       . metoprolol (LOPRESSOR) 100 MG tablet Take 1 tablet by mouth daily before breakfast.       . Multiple Vitamin (MULTIVITAMIN WITH MINERALS) TABS Take 1 tablet by mouth daily.      Marland Kitchen omeprazole (PRILOSEC) 20 MG capsule Take 20 mg by mouth daily.        Marland Kitchen PARoxetine (PAXIL) 40 MG tablet Take 1 tablet by mouth daily before breakfast.       . sitaGLIPtin (JANUVIA) 100 MG tablet Take 100 mg by mouth daily.      . traMADol (ULTRAM) 50 MG tablet Take 1 tablet by mouth as needed. Pain       No current facility-administered medications for this encounter.    REVIEW OF SYSTEMS:  A 15 point review of systems is documented in the electronic medical record. This was obtained by the nursing staff. However, I reviewed this with the patient to discuss relevant findings and make appropriate changes.  Pertinent items are noted in HPI.   PHYSICAL EXAM:  Filed  Vitals:   10/19/12 1451  BP: 132/84  Pulse: 74  Temp: 97.5 F (36.4 C)  Resp: 20  . She is a pleasant obese female in no distress sitting comfortably on examining room table. She appears older than her stated age. She has large pendulous breasts bilaterally. She has no palpable cervical supraclavicular or axillary lymph nodes. Her biopsy site has a healed over but ulcerative wound measuring about a centimeter. There is some erythema associated with this. There is no palpable abnormalities of the left or right breast.  LABORATORY DATA:  Lab Results  Component Value Date   WBC 5.4  07/03/2012   HGB 12.6 07/03/2012   HCT 38.4 07/03/2012   MCV 88.7 07/03/2012   PLT 171 07/03/2012   Lab Results  Component Value Date   NA 142 08/02/2012   K 3.7 08/02/2012   CL 101 08/02/2012   CO2 31* 08/02/2012   Lab Results  Component Value Date   ALT 12 08/02/2012   AST 15 08/02/2012   ALKPHOS 79 08/02/2012   BILITOT 0.51 08/02/2012     RADIOGRAPHY: US Breast Right  10/17/2012  *RADIOLOGY REPORT*  Clinical Data:  The patient underwent stereotactic core needle biopsy of the right anterior upper outer quadrant on 10/03/2012 with diagnosis of ductal carcinoma in situ.  Breast MRI demonstrated a linear area of enhancement in the right lower outer quadrant near the nipple.  RIGHT BREAST ULTRASOUND  Comparison:  Mammograms dated 10/02/2012, 09/19/2012 and 07/29/2011.  Breast MRI dated 10/12/2012.  On physical exam, no mass is palpated in the outer portion of the right breast.  Findings: Ultrasound is performed, showing a dilated duct beginning at the right nipple and extending approximately 4 cm deep to the nipple at 9 o'clock.  There is an intraductal mass at approximately 3-4 cm from the right nipple measuring approximately 2.3 x 0.9 x 0.4 cm.  This is felt to correspond to the MRI abnormality. Ultrasound-guided core needle biopsy is suggested.  IMPRESSION: Intraductal mass at 9 o'clock approximately 3-4 cm from the right  nipple felt to correspond to the MRI abnormality.  RECOMMENDATION: Ultrasound-guided core needle biopsy is suggested.  This will be performed and reported separately.  I have discussed the findings and recommendations with the patient. Results were also provided in writing at the conclusion of the visit.  BI-RADS CATEGORY 4:  Suspicious abnormality - biopsy should be considered.   Original Report Authenticated By: Cain Saupe, M.D.    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  Intermittent clear right nipple discharge for the past year.  BUN and creatinine were obtained on site at [facility] at [address] Results:  BUN 19 mg/dL,  Creatinine 1.4 mg/dL.  BILATERAL BREAST MRI WITH AND WITHOUT CONTRAST  Technique: Multiplanar, multisequence MR images of both breasts were obtained prior to and following the intravenous administration of 20ml of MultiHance.  Three dimensional images were evaluated at the independent DynaCad workstation.  Comparison:  Recent mammogram and biopsy examinations.  Findings: Mild background parenchymal enhancement in both breasts. Multiple small cysts in both breasts.  Biopsy marker clip artifact in the anterior aspect of the upper inner quadrant of the right breast, at the location of the recently biopsied calcifications with a pathological diagnosis of ductal carcinoma in situ.  Minimal linear enhancement along the biopsy tract at that location, and involving the overlying skin. Otherwise, no mass or abnormal enhancement is seen at that location.  6.5 cm more inferiorly in the anterior aspect of the upper inner quadrant of the right breast, in the subareolar region, a linear, branching enhancing mass is demonstrated.  This measures 3.2 x 1.4 x 0.8 cm in maximum dimensions and is within a mildly dilated duct extending into the nipple.  This mass has a mixture of  enhancement kinetics, including rapid washin/washout.  No additional masses or areas of enhancement suspicious for malignancy in either breast.  No abnormal appearing lymph nodes.  IMPRESSION:  1.  Small amount of postbiopsy enhancement at the location of  the recently diagnosed ductal carcinoma in situ in the anterior aspect of the upper inner quadrant of the right breast. 2.  3.2 x 1.4 x 0.8 cm linear, branching, enhancing mass within a mildly dilated duct in the upper inner subareolar region of the right breast.  This is 6.5 cm inferior to the recently biopsied ductal carcinoma in situ.  This has imaging features suspicious for a papillary neoplasm.  A targeted ultrasound of this region of the breast is recommended as well as ultrasound guided core needle biopsy.  If this cannot be seen by ultrasound, MR guided core needle biopsy would be recommended. 3.  No evidence of malignancy on the left and no adenopathy.  RECOMMENDATION:  Right breast ultrasound and ultrasound guided core needle biopsy. We will contact the patient to return for the ultrasound and biopsy.  THREE-DIMENSIONAL MR IMAGE RENDERING ON INDEPENDENT WORKSTATION:  Three-dimensional MR images were rendered by post-processing of the original MR data on an independent workstation.  The three- dimensional MR images were interpreted, and findings were reported in the accompanying complete MRI report for this study.  BI-RADS CATEGORY 4:  Suspicious abnormality - biopsy should be considered.   Original Report Authenticated By: Beckie Salts, M.D.    Mm Digital Diag Ltd R  10/02/2012  *RADIOLOGY REPORT*  Clinical Data:  Called back from screening mammography, right breast calcifications.  Prior history of right lung cancer, right renal cell carcinoma, and colon cancer.  Family history of breast cancer in her mother at age 25.  History of clear right nipple discharge intermittently over the past year.  DIGITAL DIAGNOSTIC RIGHT MAMMOGRAM  Comparison:  09/19/2012, 07/29/2011, dating back to 05/30/2006.  Findings:  ACR Breast Density Category 2: There is a scattered fibroglandular pattern.  Spot magnification CC and MLO views of the calcifications in the upper outer right breast, anterior third, were obtained.  There is a cluster of calcifications of varying shapes and sizes which span approximately 2.1 cm in a linear distribution.  IMPRESSION: Suspicious microcalcifications in the upper outer right breast, anterior third.  RECOMMENDATION: Stereotactic core needle biopsy of these calcifications was discussed with the patient.  She has agreed to proceed.  This has been scheduled for tomorrow, March 12, at 2 o'clock p.m.  Depending on the results of the biopsy, the patient's nipple discharge may require further imaging workup with ductography.  I have discussed the findings and recommendations with the patient. Results were also provided in writing at the conclusion of the visit.  BI-RADS CATEGORY 4:  Suspicious abnormality - biopsy should be considered.   Original Report Authenticated By: Hulan Saas, M.D.    Mm Digital Diagnostic Unilat R  10/17/2012  *RADIOLOGY REPORT*  Clinical Data:  Ultrasound-guided core needle biopsy of an intraductal mass at 9 o'clock 3-4 cm from the right nipple.  DIGITAL DIAGNOSTIC RIGHT MAMMOGRAM  Comparison:  Previous exams.  Findings:  Films are performed following ultrasound guided biopsy of an intraductal mass at 9 o'clock 3-4 cm from the right nipple. The InRad coil clip is appropriately positioned.  The clip from the prior biopsy in the upper outer quadrant is 7.5 cm from the clip placed today.  IMPRESSION: Appropriate clip placement following ultrasound-guided core needle biopsy of an intraductal mass at 9 o'clock 3-4 cm from the right nipple.   Original Report Authenticated By: Cain Saupe, M.D.    Mm Rt Breast Bx W Loc Dev 1st Lesion Image Bx Spec Stereo Guide  10/05/2012  **ADDENDUM** CREATED: 10/05/2012 11:40:15  The patient was called by myself, Dr. Micheline Maze today.  The patient states she is doing well since the right breast biopsy without problems at the biopsy site.  The patient was given the results of her right breast biopsy which was compatible with DCIS.  Pathology results are concordant with the imaging findings.  Patient's immediate questions and concerns were addressed. The patient was given an appointment for breast MRI 10/09/2012 at 07:45 a.m. and a surgical appointment with Dr. Dwain Sarna 10/09/2012 and 10 a.m.  **END ADDENDUM** SIGNED BY: Melton Alar. Micheline Maze, M.D.   10/03/2012  *RADIOLOGY REPORT*  Clinical Data:  Patient presents for a stereotactic core needle biopsy of a 2.1 cm group of pleomorphic microcalcifications over the anterior aspect of the upper central right breast.  STEREOTACTIC-GUIDED VACUUM ASSISTED BIOPSY OF THE RIGHT BREAST AND SPECIMEN RADIOGRAPH  Comparison: Previous exams.  I met with the patient and we discussed the procedure of stereotactic-guided biopsy, including benefits and alternatives. We discussed the high likelihood of a successful procedure. We discussed the risks of the procedure, including infection, bleeding, tissue injury, clip migration, and inadequate sampling. Informed, written consent was given.  Using sterile technique, 2% lidocaine, stereotactic guidance, and a 9 gauge vacuum assisted device, biopsy was performed of the targeted microcalcifications of the upper central right breast using a superior to inferior approach.  Specimen radiograph was performed, showing multiple of the targeted microcalcifications. Specimens with calcifications are identified for pathology.  At the conclusion of the procedure, a dumbbell shaped tissue marker clip was deployed into the biopsy cavity.  Follow-up 2-view mammogram confirmed clip accurately at the biopsy site.  IMPRESSION: Stereotactic-guided biopsy of a group of pleomorphic right breast microcalcifications.  No apparent complications.    Original Report Authenticated By: Elberta Fortis, M.D.    [place] Rt Breast Bx W Loc Dev 1st Lesion Img Bx Spec US Guide  10/18/2012  **ADDENDUM** CREATED: 10/18/2012 11:06:35  Addendum by Dr. Christiana Pellant 10/18/2012 at 11:05 a.m.  I spoke with the patient by telephone at her request to discuss pathology results.  Pathology demonstrates papilloma, which is concordant with the biopsy and imaging appearance.  However, surgical excision is recommended.  The patient has an appointment today with Dr. Dwain Sarna at 2:30 p.m. to further discuss surgical planning.  She reports no problems at the biopsy site overnight and all questions were answered.  **END ADDENDUM** SIGNED BY: Harrel Lemon, M.D.   10/17/2012  *RADIOLOGY REPORT*  Clinical Data:  Intraductal mass at 9 o'clock 3-4 cm from the right nipple.  Known ductal carcinoma in situ in the right anterior upper outer quadrant.  ULTRASOUND GUIDED VACUUM ASSISTED CORE BIOPSY OF THE RIGHT BREAST  The patient and I discussed the procedure of ultrasound-guided biopsy, including benefits and alternatives.  We discussed the high likelihood of a successful procedure. We discussed the risks of the procedure including infection, bleeding, tissue injury, clip migration, and inadequate sampling.  Written informed consent was given.  Using sterile technique, 2% lidocaine, ultrasound guidance, and a 12 gauge vacuum assisted needle, biopsy was performed of an intraductal mass at 9 o'clock 3-4 cm from the right nipple using a craniocaudal approach.  At the conclusion of the procedure, a coil tissue marker clip was deployed into the biopsy cavity.  Follow-up 2-view mammogram was performed and dictated separately.  IMPRESSION: Ultrasound-guided biopsy of an intraductal mass at 9 o'clock 3-4 cm from the right nipple.  No apparent complications.   Original Report Authenticated By: Cain Saupe, M.D.  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 We discussed the highly curable nature of this disease. We discussed the use of radiation to decrease local failures in patients who undergo breast conservation. We discussed the process of simulation the placement tattoos. We discussed 6 and half weeks of treatment as an outpatient. We did that the possible side effects of treatment including lung damage rib damage and skin darkness and redness. We discussed fatigue is another side effect. We discussed the 10th fold increase in the lung cancers in patients who continued to smoke and receives radiation. We discussed that lung cancer often times is not curable units on an early stages as her previous lung cancer was. She has attempted smoking cessation and did so for 7 months but unfortunately has restarted. She really does not feel she can quit at this time. She is not really interested in radiation. She understands that there is a higher chance of DCIS occurring without radiation. We discussed that if this didn't happen she could have local excision at that time and it was possible but unlikely that cancer would recur systemically as well as in the breast. We discussed that the possibility is that the DCIS C. date coming back as invasive cancer. We discussed that we would discuss her case after her final surgery and if any recommendations change that we will contact her. I think it is reasonable to offer observation at this time as long as she agrees to close clinical followup as well as radiation should she experience a recurrence. She seemed relieved to not have to undergo radiation as it would be quite some feet to come in every day for treatment for her. She is going to meet with Dr. Welton Flakes and discuss antiestrogen therapy and how that might be an option in decreasing her risk for local recurrence. We did discuss mastectomies. We discussed bilateral mastectomies versus unilateral mastectomies. At this point given the large size of  her breasts I think a unilateral mastectomy on the right side would leave her with significant deformity and it would be difficult to find a prosthesis large enough to have any sort of similar breast size. Bilateral mastectomies however is a significant surgery and given her history of wound healing I do not recommend that. She has been set up to be seen by genetic counseling. I have not scheduled followup with her. I be happy to see her back if she has any further questions.  I spent 40 minutes  face to face with the patient and more than 50% of that time was spent in counseling and/or coordination of care.   ------------------------------------------------  Lurline Hare, MD

## 2012-10-23 ENCOUNTER — Ambulatory Visit: Payer: Self-pay | Admitting: Obstetrics and Gynecology

## 2012-10-25 ENCOUNTER — Other Ambulatory Visit: Payer: Self-pay | Admitting: *Deleted

## 2012-10-25 DIAGNOSIS — D0511 Intraductal carcinoma in situ of right breast: Secondary | ICD-10-CM

## 2012-10-25 DIAGNOSIS — D0510 Intraductal carcinoma in situ of unspecified breast: Secondary | ICD-10-CM | POA: Insufficient documentation

## 2012-10-26 ENCOUNTER — Ambulatory Visit (HOSPITAL_BASED_OUTPATIENT_CLINIC_OR_DEPARTMENT_OTHER): Payer: Medicare Other | Admitting: Oncology

## 2012-10-26 ENCOUNTER — Encounter (INDEPENDENT_AMBULATORY_CARE_PROVIDER_SITE_OTHER): Payer: Self-pay | Admitting: General Surgery

## 2012-10-26 ENCOUNTER — Telehealth: Payer: Self-pay | Admitting: *Deleted

## 2012-10-26 ENCOUNTER — Encounter: Payer: Self-pay | Admitting: Oncology

## 2012-10-26 ENCOUNTER — Ambulatory Visit: Payer: Medicare Other | Admitting: Oncology

## 2012-10-26 ENCOUNTER — Ambulatory Visit (INDEPENDENT_AMBULATORY_CARE_PROVIDER_SITE_OTHER): Payer: Medicare Other | Admitting: General Surgery

## 2012-10-26 ENCOUNTER — Ambulatory Visit: Payer: Medicare Other

## 2012-10-26 ENCOUNTER — Other Ambulatory Visit: Payer: Medicare Other | Admitting: Lab

## 2012-10-26 ENCOUNTER — Other Ambulatory Visit (HOSPITAL_BASED_OUTPATIENT_CLINIC_OR_DEPARTMENT_OTHER): Payer: Medicare Other | Admitting: Lab

## 2012-10-26 VITALS — BP 134/62 | HR 64 | Temp 98.3°F | Resp 16 | Ht 66.0 in | Wt 248.0 lb

## 2012-10-26 VITALS — BP 128/83 | HR 81 | Temp 98.4°F | Resp 20 | Ht 66.0 in | Wt 248.4 lb

## 2012-10-26 DIAGNOSIS — D0590 Unspecified type of carcinoma in situ of unspecified breast: Secondary | ICD-10-CM

## 2012-10-26 DIAGNOSIS — Z85528 Personal history of other malignant neoplasm of kidney: Secondary | ICD-10-CM

## 2012-10-26 DIAGNOSIS — Z85038 Personal history of other malignant neoplasm of large intestine: Secondary | ICD-10-CM

## 2012-10-26 DIAGNOSIS — C187 Malignant neoplasm of sigmoid colon: Secondary | ICD-10-CM

## 2012-10-26 DIAGNOSIS — D0511 Intraductal carcinoma in situ of right breast: Secondary | ICD-10-CM

## 2012-10-26 DIAGNOSIS — Z170 Estrogen receptor positive status [ER+]: Secondary | ICD-10-CM

## 2012-10-26 LAB — COMPREHENSIVE METABOLIC PANEL (CC13)
ALT: 16 U/L (ref 0–55)
AST: 14 U/L (ref 5–34)
Albumin: 3.5 g/dL (ref 3.5–5.0)
BUN: 23.5 mg/dL (ref 7.0–26.0)
CO2: 32 mEq/L — ABNORMAL HIGH (ref 22–29)
Calcium: 9.6 mg/dL (ref 8.4–10.4)
Chloride: 100 mEq/L (ref 98–107)
Creatinine: 0.9 mg/dL (ref 0.6–1.1)
Potassium: 3.7 mEq/L (ref 3.5–5.1)

## 2012-10-26 LAB — CBC WITH DIFFERENTIAL/PLATELET
BASO%: 0.6 % (ref 0.0–2.0)
Basophils Absolute: 0 10*3/uL (ref 0.0–0.1)
EOS%: 0.9 % (ref 0.0–7.0)
HCT: 41 % (ref 34.8–46.6)
HGB: 13.6 g/dL (ref 11.6–15.9)
MCH: 28.1 pg (ref 25.1–34.0)
MONO#: 0.3 10*3/uL (ref 0.1–0.9)
NEUT%: 58.4 % (ref 38.4–76.8)
RDW: 15.7 % — ABNORMAL HIGH (ref 11.2–14.5)
WBC: 5.6 10*3/uL (ref 3.9–10.3)
lymph#: 1.9 10*3/uL (ref 0.9–3.3)

## 2012-10-26 NOTE — Telephone Encounter (Signed)
appts made and printed 

## 2012-10-26 NOTE — Progress Notes (Signed)
Checked in new pt with no financial concerns. °

## 2012-10-26 NOTE — Patient Instructions (Addendum)
Proceed with surgery  i will see you in June 2014

## 2012-10-29 NOTE — Progress Notes (Signed)
Subjective:     Patient ID: Amy [name], female   DOB: 19-Aug-1945, 67 y.o.   MRN: [id number]  HPI 66yof with right breast dcis and papilloma. She comes back in today to discuss surgery again. She has seen rad onc and is due to see med onc later today  Review of Systems     Objective:   Physical Exam deferred    Assessment:     Right breast dcis and papilloma     Plan:     We discussed plan for lumpectomy for right breast dcis and excisional wire guided biopsy of papilloma. She understands need for further surgery. She also understands need for adjuvant therapy which will hopefully just be AE alone.

## 2012-10-30 ENCOUNTER — Encounter: Payer: Self-pay | Admitting: Obstetrics and Gynecology

## 2012-10-30 ENCOUNTER — Ambulatory Visit (INDEPENDENT_AMBULATORY_CARE_PROVIDER_SITE_OTHER): Payer: Medicare Other | Admitting: Obstetrics and Gynecology

## 2012-10-30 VITALS — BP 124/80 | Ht 65.0 in | Wt 247.0 lb

## 2012-10-30 DIAGNOSIS — Z01419 Encounter for gynecological examination (general) (routine) without abnormal findings: Secondary | ICD-10-CM

## 2012-10-30 NOTE — Progress Notes (Signed)
67 y.o.  Widowed  African American female   G2P0.. here for annual exam.  Had a colonoscopy last Sept, and a cancer was found, and she had surgery Nov 7th, 2013.  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  Kidney is doing fine.  Planning lumpectomy on right in May by Dr. Dwain Sarna.  AODM under better control.    No LMP recorded. Patient has had a hysterectomy.          Sexually active: no  The current method of family planning is abstinence.    Exercising: no Last mammogram:  09/17/12 Rt breast ductal carcinoma Last pap smear:06/14/2002 neg History of abnormal pap: no Smoking:yes 1 pack a day Alcohol:no Last colonoscopy:04/02/2012 polyp removed(tumor) Last Bone Density:  none Last tetanus shot:less than 10 yrs ago Last cholesterol check: not sure  Hgb: pcp               Urine:pcp    Health Maintenance  Topic Date Due  . Tetanus/tdap  02/03/1965  . Zostavax  02/03/2006  . Pneumococcal Polysaccharide Vaccine Age 50 And Over  02/04/2011  . Influenza Vaccine  03/25/2013  . Colonoscopy  04/02/2013  . Mammogram  10/18/2014    Family History  Problem Relation Age of Onset  . Colon polyps Brother   . Cancer Brother   . Breast cancer Mother   . Heart disease Father   . Colon cancer Neg Hx   . Esophageal cancer Neg Hx   . Rectal cancer Neg Hx   . Stomach cancer Neg Hx     Patient Active Problem List  Diagnosis  . COLONIC POLYPS, ADENOMATOUS  . Lung cancer  . Renal cell cancer  . Incisional hernia  . Breast cancer, right breast  . Colon cancer  . DCIS (ductal carcinoma in situ) of breast    Past Medical History  Diagnosis Date  . Depression   . Diabetes mellitus     diet controlled  . Anemia   . Anxiety   . Arthritis   . Hypertension   . Hernia     Near umbilicus  . Hemorrhoids   . Heart murmur   . Cancer     colon,renal,lung  . Sleep apnea     mild no cpap need has been  tested in the pass score was 11  . Lung cancer 2001  . Colon cancer 1999, 2013  . Breast cancer, right breast 10/03/12    Past Surgical History  Procedure Laterality Date  . Colonoscopy    . Polypectomy    . Lung lobectomy      right lower  . Colon surgery      chemo  . Kidney surgery  1999    right/chemo kidney removed for cancer  . Partial hysterectomy    . Breast biopsy      Bilateral cysts/benign  . Colectomy  05-31-2012    laparoscopic ,possible open  . Abdominal hysterectomy  80's    fibroids  . Tonsillectomy      as child  . Partial colectomy  05/31/2012    Procedure: PARTIAL COLECTOMY;  Surgeon: Emelia Loron, MD;  Location: [facility];  Service: General;  Laterality: N/A;  . Breast biopsy Right 10/03/12    ER/PR +    Allergies: Iodine and Iohexol  Current Outpatient Prescriptions  Medication Sig Dispense Refill  . ALPRAZolam (XANAX) 1 MG  tablet Take 1 mg by mouth at bedtime as needed. sleep       . Ascorbic Acid (VITAMIN C WITH ROSE HIPS) 500 MG tablet Take 500 mg by mouth daily.        Marland Kitchen atorvastatin (LIPITOR) 20 MG tablet Take 20 mg by mouth daily before breakfast.       . Calcium Carbonate-Vitamin D (CALCIUM-VITAMIN D) 500-200 MG-UNIT per tablet Take 1 tablet by mouth 2 (two) times daily with a meal.        . Ezetimibe (ZETIA PO) Take by mouth.      . Ferrous Gluconate (IRON) 240 (27 FE) MG TABS Take 1 tablet by mouth daily.        . hydrochlorothiazide 25 MG tablet Take 1 tablet by mouth daily before breakfast.       . lisinopril (PRINIVIL,ZESTRIL) 10 MG tablet Take 10 mg by mouth daily before breakfast.       . metoprolol (LOPRESSOR) 100 MG tablet Take 1 tablet by mouth daily before breakfast.       . Multiple Vitamin (MULTIVITAMIN WITH MINERALS) TABS Take 1 tablet by mouth daily.      Marland Kitchen omeprazole (PRILOSEC) 20 MG capsule Take 20 mg by mouth daily.        Marland Kitchen PARoxetine (PAXIL) 40 MG tablet Take 1 tablet by mouth daily before breakfast.       . sitaGLIPtin  (JANUVIA) 100 MG tablet Take 100 mg by mouth daily.      . traMADol (ULTRAM) 50 MG tablet Take 1 tablet by mouth as needed. Pain       No current facility-administered medications for this visit.    ROS: Pertinent items are noted in HPI.  Exam:    BP 124/80  Ht 5\' 5"  (1.651 m)  Wt 247 lb (112.038 kg)  BMI 41.1 kg/m2  Down 30 pounds from last year Wt Readings from Last 3 Encounters:  10/30/12 247 lb (112.038 kg)  10/26/12 248 lb 6.4 oz (112.674 kg)  10/26/12 248 lb (112.492 kg)     Ht Readings from Last 3 Encounters:  10/30/12 5\' 5"  (1.651 m)  10/26/12 5\' 6"  (1.676 m)  10/26/12 5\' 6"  (1.676 m)    General appearance: alert, cooperative and appears stated age Head: Normocephalic, without obvious abnormality, atraumatic Neck: no adenopathy, supple, symmetrical, trachea midline and thyroid not enlarged, symmetric, no tenderness/mass/nodules Lungs: clear to auscultation bilaterally Breasts: Very large and pendulous, No nipple retraction or dimpling, No nipple discharge or bleeding, No axillary or supraclavicular adenopathy, Normal to palpation without dominant masses Right breast with healing lesion s/p biopsy at 8 o'clock near nipple, and keloid at 11 o'clock near axilla.   Heart: regular rate and rhythm Abdomen: soft, non-tender; bowel sounds normal; no masses,  no organomegaly Large midline scar with puckering where umbilicus would be and pinpoint opening that does not drain any fluid Extremities: extremities normal, atraumatic, no cyanosis or edema Skin: Skin color, texture, turgor normal. No rashes or lesions Lymph nodes: Cervical, supraclavicular, and axillary nodes normal. No abnormal inguinal nodes palpated Neurologic: Grossly normal   Pelvic: External genitalia:  no lesions              Urethra:  normal appearing urethra with no masses, tenderness or lesions              Bartholins and Skenes: normal                 Vagina: normal appearing vagina with  normal color and  discharge, no lesions                            Pap taken: no        Bimanual Exam:                                        Adnexa: normal adnexa in size, nontender and no masses                                      Rectovaginal: Confirms. No masses                                      Anus:  normal sphincter tone, no lesions  A: normal meno exam, no HRT     S/p TAH firboirds, ovaries retained     H/o colon, lung, and kidney cancer, with recurrent colon cancer and now Rt breast cancer     Ventral hernia, 15 cm, repaired when 2nd colon cancer surgery was done     Dep/anx     Morbid obesity     AODM     P: mammogram counseled on breast self exam, mammography screening, adequate intake of calcium and vitamin D, diet and exercise return annually or prn     An After Visit Summary was printed and given to the patient.

## 2012-10-30 NOTE — Patient Instructions (Addendum)

## 2012-10-31 ENCOUNTER — Telehealth: Payer: Self-pay | Admitting: Oncology

## 2012-10-31 NOTE — Telephone Encounter (Signed)
Faxed pt medical records to Dr. Nehemiah Settle

## 2012-11-01 ENCOUNTER — Telehealth: Payer: Self-pay | Admitting: *Deleted

## 2012-11-01 ENCOUNTER — Encounter: Payer: Self-pay | Admitting: *Deleted

## 2012-11-01 ENCOUNTER — Telehealth (INDEPENDENT_AMBULATORY_CARE_PROVIDER_SITE_OTHER): Payer: Self-pay

## 2012-11-01 ENCOUNTER — Ambulatory Visit: Payer: Medicare Other | Admitting: Oncology

## 2012-11-01 ENCOUNTER — Ambulatory Visit: Payer: Medicare Other | Admitting: Hematology and Oncology

## 2012-11-01 NOTE — Telephone Encounter (Signed)
Left vm on home and cell phone requesting call back. Left name and call back number.

## 2012-11-01 NOTE — Telephone Encounter (Signed)
Pt called wanting to know why she has appt next week with Dr Michell Heinrich. I explained to pt she will meet with Dr Michell Heinrich again to review pre op radiation and for marking. Pt states she understands and will keep appt.

## 2012-11-01 NOTE — Progress Notes (Signed)
Location of Breast Cancer: right, upper central  Histology per Pathology Report: DCIS w/calcifications, necrosis  Receptor Status: ER(+), PR (+), Her2-neu ()  Did patient present with symptoms (if so, please note symptoms) or was this found on screening mammography?: screening  Past/Anticipated interventions by surgeon, if any: biopsy, per Dr Doreen Salvage note 10/29/12, pt needs lumpectomy, biopsy of papilloma, none scheduled at this time  Past/Anticipated interventions by medical oncology, if any: note from Dr Welton Flakes dated 10/26/12 states "Proceed w/surgery. I will see you in June 2014."  Lymphedema issues, if any:    Pain issues, if any:    SAFETY ISSUES:  Prior radiation? no  Pacemaker/ICD? no  Possible current pregnancy? no  Is the patient on methotrexate? no  Current Complaints / other details:

## 2012-11-02 ENCOUNTER — Ambulatory Visit: Payer: Medicare Other | Admitting: Radiation Oncology

## 2012-11-02 ENCOUNTER — Ambulatory Visit: Payer: Medicare Other

## 2012-11-07 ENCOUNTER — Telehealth (INDEPENDENT_AMBULATORY_CARE_PROVIDER_SITE_OTHER): Payer: Self-pay

## 2012-11-07 NOTE — Telephone Encounter (Signed)
Pt returned my call and she is ready to get her lumpectomy scheduled with DR Dwain Sarna. The pt is requesting for the surgery to be done at the end of May. I told I would let Dr Dwain Sarna know this info. And if there were any concerns I would call her back next week when he returns to the office. The pt is ok with this plan. I did turn her orders into surgery scheduling.

## 2012-11-07 NOTE — Telephone Encounter (Signed)
LMOM for pt to call me b/c I need to know what she has decided about getting scheduled for surgery by Dr Dwain Sarna.

## 2012-11-13 ENCOUNTER — Other Ambulatory Visit (INDEPENDENT_AMBULATORY_CARE_PROVIDER_SITE_OTHER): Payer: Self-pay | Admitting: General Surgery

## 2012-11-13 DIAGNOSIS — D0511 Intraductal carcinoma in situ of right breast: Secondary | ICD-10-CM

## 2012-11-16 ENCOUNTER — Encounter: Payer: Self-pay | Admitting: *Deleted

## 2012-11-16 NOTE — Progress Notes (Signed)
Mailed after appt letter to pt. 

## 2012-11-26 NOTE — Progress Notes (Signed)
OFFICE PROGRESS NOTE  CC  POLITE,[name], MD [address] [place] [address] Dr. 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 female whose cancer history began in 1999 when she was diagnosed with a IIIB colon cancer. She underwent a colectomy followed by 6 months of chemotherapy under the care of Dr. Cleone Slim  #2 She had a T2 N0 renal cell carcinoma the right kidney in about that same time. In November 2001 she had a T1 N0 non-small cell lung cancer removed from the right lung with a VATS procedure by Dr. Edwyna Shell. She has been monitored with serial colonoscopies and has had several adenomas removed. She was seen at the [facility] genetics clinic but was not tested that I can see. She says she is in some sort of study looking at her and as well as the rest of her family due to the high rate of cancers there.   #3 A colonoscopy in September of last year showed an ulcerative mass in the transverse colon. Biopsy revealed polyps that were negative for dysplasia. Surgical excision was recommended. 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 Margins were negative. 0/9 lymph nodes were positive. No adjuvant chemotherapy was recommended. She then underwent her screening mammogram in March of this year  #4 She then underwent her screening mammogram in March of this year. New calcifications were noted. 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 This was ER positive at 100% PR positive at 100%. Followup MRI revealed with artifact as well as a 3.2 x 1.4 x 0.8 cm mass in the upper and her region of the right breast. This had features suspicious for a papillary neoplasm. Ultrasound is recommended.  Ultrasound-guided biopsy of this area revealed an intraductal papilloma  #5 She met with Dr. Dwain Sarna and discussed surgical options. They discussed mastectomy versus lumpectomy. She did have difficulties with wound healing after her colectomy and actually still has a bandage in place for this. She has been on disability since 2000 due to her multiple medical problems as well as her multiple cancers. She still has some pain from her biopsy   CURRENT THERAPY: proceed with surgery  INTERVAL HISTORY: LUVIA ORZECHOWSKI 67 y.o. female returns for initial visit with me. Overall patient is doing well without any problems. We discussed prior history. Patient now with diagnosis of DCIS high grade with comedonecrosis and calcification ER positive PR positive. MRI showing 3.2 cm area of concern in the upper region of the right breast. She's been seen by Dr. Michell Heinrich as well as Dr. Dwain Sarna.do think that patient is a good candidate for lumpectomy. I have recommended this for her.  MEDICAL HISTORY: Past Medical History  Diagnosis Date  . Depression   . Diabetes mellitus     diet controlled  . Anemia   . Anxiety   . Arthritis   . Hypertension   . Hernia     Near umbilicus  . Hemorrhoids   . Heart murmur   . Cancer     colon,renal,lung  . Sleep apnea     mild no cpap need has been tested in the pass score was 11  . Lung cancer 2001  . Colon cancer 1999, 2013  . Breast cancer, right  breast 10/03/12    ALLERGIES:  is allergic to iodine and iohexol.  MEDICATIONS:  Current Outpatient Prescriptions  Medication Sig Dispense Refill  . ALPRAZolam (XANAX) 1 MG tablet Take 1 mg by mouth at bedtime as needed. sleep       . Ascorbic Acid (VITAMIN C WITH ROSE HIPS) 500 MG tablet Take 500 mg by mouth daily.        Marland Kitchen atorvastatin (LIPITOR) 20 MG tablet Take 20 mg by mouth daily before breakfast.       . Calcium Carbonate-Vitamin D (CALCIUM-VITAMIN D) 500-200 MG-UNIT per tablet Take 1 tablet by mouth 2 (two)  times daily with a meal.        . Ezetimibe (ZETIA PO) Take by mouth.      . Ferrous Gluconate (IRON) 240 (27 FE) MG TABS Take 1 tablet by mouth daily.        . hydrochlorothiazide 25 MG tablet Take 1 tablet by mouth daily before breakfast.       . lisinopril (PRINIVIL,ZESTRIL) 10 MG tablet Take 10 mg by mouth daily before breakfast.       . metoprolol (LOPRESSOR) 100 MG tablet Take 1 tablet by mouth daily before breakfast.       . Multiple Vitamin (MULTIVITAMIN WITH MINERALS) TABS Take 1 tablet by mouth daily.      Marland Kitchen omeprazole (PRILOSEC) 20 MG capsule Take 20 mg by mouth daily.        Marland Kitchen PARoxetine (PAXIL) 40 MG tablet Take 1 tablet by mouth daily before breakfast.       . sitaGLIPtin (JANUVIA) 100 MG tablet Take 100 mg by mouth daily.      . traMADol (ULTRAM) 50 MG tablet Take 1 tablet by mouth as needed. Pain       No current facility-administered medications for this visit.    SURGICAL HISTORY:  Past Surgical History  Procedure Laterality Date  . Colonoscopy    . Polypectomy    . Lung lobectomy      right lower  . Colon surgery      chemo  . Kidney surgery  1999    right/chemo kidney removed for cancer  . Partial hysterectomy    . Breast biopsy      Bilateral cysts/benign  . Colectomy  05-31-2012    laparoscopic ,possible open  . Abdominal hysterectomy  80's    fibroids  . Tonsillectomy      as child  . Partial colectomy  05/31/2012    Procedure: PARTIAL COLECTOMY;  Surgeon: Emelia Loron, MD;  Location: [facility];  Service: General;  Laterality: N/A;  . Breast biopsy Right 10/03/12    ER/PR +    REVIEW OF SYSTEMS:  A comprehensive review of systems was negative.   HEALTH MAINTENANCE:  PHYSICAL EXAMINATION: Blood pressure 128/83, pulse 81, temperature 98.4 F (36.9 C), temperature source Oral, resp. rate 20, height 5\' 6"  (1.676 m), weight 248 lb 6.4 oz (112.674 kg). Body mass index is 40.11 kg/(m^2).  ECOG PERFORMANCE STATUS: 0 - Asymptomatic   LABORATORY  DATA: Lab Results  Component Value Date   WBC 5.6 10/26/2012   HGB 13.6 10/26/2012   HCT 41.0 10/26/2012   MCV 84.6 10/26/2012   PLT 128* 10/26/2012      Chemistry      Component Value Date/Time   NA 142 10/26/2012 1511   NA 150* 06/05/2012 0420   K 3.7 10/26/2012 1511   K 3.8 06/05/2012 0420   CL 100 10/26/2012  1511   CL 110 06/05/2012 0420   CO2 32* 10/26/2012 1511   CO2 34* 06/05/2012 0420   BUN 23.5 10/26/2012 1511   BUN 7 06/05/2012 0420   CREATININE 0.9 10/26/2012 1511   CREATININE 0.69 06/05/2012 0420      Component Value Date/Time   CALCIUM 9.6 10/26/2012 1511   CALCIUM 9.3 06/05/2012 0420   ALKPHOS 72 10/26/2012 1511   ALKPHOS 67 04/02/2012 1147   AST 14 10/26/2012 1511   AST 22 04/02/2012 1147   ALT 16 10/26/2012 1511   ALT 19 04/02/2012 1147   BILITOT 0.71 10/26/2012 1511   BILITOT 0.9 04/02/2012 1147       RADIOGRAPHIC STUDIES:  No results found.  ASSESSMENT: 67 year old female with new diagnosis of right DCIS that is ER positive PR positive. Overall patient is doing well she is an excellent candidate for lumpectomy. She'll see Dr. Emelia Loron for this.   PLAN:   #1 proceed with surgery.  #2 I will see her back after her surgery.   All questions were answered. The patient knows to call the clinic with any problems, questions or concerns. We can certainly see the patient much sooner if necessary.  I spent 45 minutes counseling the patient face to face. The total time spent in the appointment was 45 minutes.    Drue Second, MD Medical/Oncology [facility] [phone] (beeper) [phone] (Office)  11/26/2012, 10:18 PM

## 2012-12-07 ENCOUNTER — Encounter (HOSPITAL_COMMUNITY): Payer: Self-pay | Admitting: Pharmacy Technician

## 2012-12-11 NOTE — Pre-Procedure Instructions (Signed)
Amy [name]  12/11/2012   Your procedure is scheduled on:  May 29  Report to [facility] at Immediately after breast center appt. BRING XRAY FILMS with You to Short Stay  Call this number if you have problems the morning of surgery: [phone]   Remember:   Do not eat food or drink liquids after midnight.   Take these medicines the morning of surgery with A SIP OF WATER: Xanax, Metoprolol, Omeprazole, Tramadol (if needed)   Do not wear jewelry, make-up or nail polish.  Do not wear lotions, powders, or perfumes. You may wear deodorant.  Do not shave 48 hours prior to surgery. Men may shave face and neck.  Do not bring valuables to the hospital.  Contacts, dentures or bridgework may not be worn into surgery.  Leave suitcase in the car. After surgery it may be brought to your room.  For patients admitted to the hospital, checkout time is 11:00 AM the day of discharge.   Patients discharged the day of surgery will not be allowed to drive home.  Name and phone number of your driver: Family/ Friend  Special Instructions: Shower using CHG 2 nights before surgery and the night before surgery.  If you shower the day of surgery use CHG.  Use special wash - you have one bottle of CHG for all showers.  You should use approximately 1/3 of the bottle for each shower.   Please read over the following fact sheets that you were given: Pain Booklet, Coughing and Deep Breathing and Surgical Site Infection Prevention

## 2012-12-12 ENCOUNTER — Encounter (HOSPITAL_COMMUNITY)
Admission: RE | Admit: 2012-12-12 | Discharge: 2012-12-12 | Disposition: A | Discharge status: Home / Self Care | Payer: Medicare Other | Source: Ambulatory Visit | Attending: Anesthesiology | Admitting: Anesthesiology

## 2012-12-12 ENCOUNTER — Encounter (HOSPITAL_COMMUNITY): Payer: Self-pay

## 2012-12-12 ENCOUNTER — Encounter (HOSPITAL_COMMUNITY)
Admission: RE | Admit: 2012-12-12 | Discharge: 2012-12-12 | Disposition: A | Discharge status: Home / Self Care | Payer: Medicare Other | Source: Ambulatory Visit | Attending: General Surgery | Admitting: General Surgery

## 2012-12-12 DIAGNOSIS — Z01818 Encounter for other preprocedural examination: Secondary | ICD-10-CM | POA: Insufficient documentation

## 2012-12-12 DIAGNOSIS — Z01810 Encounter for preprocedural cardiovascular examination: Secondary | ICD-10-CM | POA: Insufficient documentation

## 2012-12-12 DIAGNOSIS — Z01812 Encounter for preprocedural laboratory examination: Secondary | ICD-10-CM | POA: Insufficient documentation

## 2012-12-12 LAB — CBC WITH DIFFERENTIAL/PLATELET
Basophils Relative: 0 % (ref 0–1)
Eosinophils Absolute: 0.1 10*3/uL (ref 0.0–0.7)
Eosinophils Relative: 1 % (ref 0–5)
HCT: 41.8 % (ref 36.0–46.0)
Hemoglobin: 14.4 g/dL (ref 12.0–15.0)
MCH: 29.1 pg (ref 26.0–34.0)
MCHC: 34.4 g/dL (ref 30.0–36.0)
MCV: 84.6 fL (ref 78.0–100.0)
Monocytes Absolute: 0.5 10*3/uL (ref 0.1–1.0)
Monocytes Relative: 8 % (ref 3–12)
RDW: 15.6 % — ABNORMAL HIGH (ref 11.5–15.5)

## 2012-12-12 LAB — BASIC METABOLIC PANEL
BUN: 18 mg/dL (ref 6–23)
Chloride: 99 mEq/L (ref 96–112)
Creatinine, Ser: 0.88 mg/dL (ref 0.50–1.10)
Glucose, Bld: 104 mg/dL — ABNORMAL HIGH (ref 70–99)
Potassium: 3.8 mEq/L (ref 3.5–5.1)

## 2012-12-19 MED ORDER — DEXTROSE 5 % IV SOLN
3.0000 g | INTRAVENOUS | Status: AC
  Administered 2012-12-20: 3 g via INTRAVENOUS
  Filled 2012-12-19: qty 3000

## 2012-12-20 ENCOUNTER — Ambulatory Visit (HOSPITAL_COMMUNITY): Payer: Medicare Other | Admitting: Anesthesiology

## 2012-12-20 ENCOUNTER — Ambulatory Visit
Admission: RE | Admit: 2012-12-20 | Discharge: 2012-12-20 | Disposition: A | Discharge status: Home / Self Care | Payer: Medicare Other | Source: Ambulatory Visit | Attending: General Surgery | Admitting: General Surgery

## 2012-12-20 ENCOUNTER — Other Ambulatory Visit (INDEPENDENT_AMBULATORY_CARE_PROVIDER_SITE_OTHER): Payer: Self-pay | Admitting: General Surgery

## 2012-12-20 ENCOUNTER — Encounter (HOSPITAL_COMMUNITY)
Admission: RE | Disposition: A | Discharge status: Home / Self Care | Payer: Self-pay | Source: Ambulatory Visit | Attending: General Surgery

## 2012-12-20 ENCOUNTER — Encounter (HOSPITAL_COMMUNITY): Payer: Self-pay | Admitting: Anesthesiology

## 2012-12-20 ENCOUNTER — Encounter (HOSPITAL_COMMUNITY): Payer: Self-pay | Admitting: *Deleted

## 2012-12-20 ENCOUNTER — Ambulatory Visit (HOSPITAL_COMMUNITY)
Admission: RE | Admit: 2012-12-20 | Discharge: 2012-12-20 | Disposition: A | Discharge status: Home / Self Care | Payer: Medicare Other | Source: Ambulatory Visit | Attending: General Surgery | Admitting: General Surgery

## 2012-12-20 DIAGNOSIS — F3289 Other specified depressive episodes: Secondary | ICD-10-CM | POA: Insufficient documentation

## 2012-12-20 DIAGNOSIS — D249 Benign neoplasm of unspecified breast: Secondary | ICD-10-CM

## 2012-12-20 DIAGNOSIS — R011 Cardiac murmur, unspecified: Secondary | ICD-10-CM | POA: Insufficient documentation

## 2012-12-20 DIAGNOSIS — Z79899 Other long term (current) drug therapy: Secondary | ICD-10-CM | POA: Insufficient documentation

## 2012-12-20 DIAGNOSIS — G4730 Sleep apnea, unspecified: Secondary | ICD-10-CM | POA: Insufficient documentation

## 2012-12-20 DIAGNOSIS — D0511 Intraductal carcinoma in situ of right breast: Secondary | ICD-10-CM

## 2012-12-20 DIAGNOSIS — I10 Essential (primary) hypertension: Secondary | ICD-10-CM | POA: Insufficient documentation

## 2012-12-20 DIAGNOSIS — Z85118 Personal history of other malignant neoplasm of bronchus and lung: Secondary | ICD-10-CM | POA: Insufficient documentation

## 2012-12-20 DIAGNOSIS — D649 Anemia, unspecified: Secondary | ICD-10-CM | POA: Insufficient documentation

## 2012-12-20 DIAGNOSIS — D0590 Unspecified type of carcinoma in situ of unspecified breast: Secondary | ICD-10-CM

## 2012-12-20 DIAGNOSIS — Z85528 Personal history of other malignant neoplasm of kidney: Secondary | ICD-10-CM | POA: Insufficient documentation

## 2012-12-20 DIAGNOSIS — Z9049 Acquired absence of other specified parts of digestive tract: Secondary | ICD-10-CM | POA: Insufficient documentation

## 2012-12-20 DIAGNOSIS — F329 Major depressive disorder, single episode, unspecified: Secondary | ICD-10-CM | POA: Insufficient documentation

## 2012-12-20 DIAGNOSIS — F411 Generalized anxiety disorder: Secondary | ICD-10-CM | POA: Insufficient documentation

## 2012-12-20 DIAGNOSIS — Z905 Acquired absence of kidney: Secondary | ICD-10-CM | POA: Insufficient documentation

## 2012-12-20 DIAGNOSIS — E119 Type 2 diabetes mellitus without complications: Secondary | ICD-10-CM | POA: Insufficient documentation

## 2012-12-20 DIAGNOSIS — Z9221 Personal history of antineoplastic chemotherapy: Secondary | ICD-10-CM | POA: Insufficient documentation

## 2012-12-20 DIAGNOSIS — F17200 Nicotine dependence, unspecified, uncomplicated: Secondary | ICD-10-CM | POA: Insufficient documentation

## 2012-12-20 DIAGNOSIS — Z85038 Personal history of other malignant neoplasm of large intestine: Secondary | ICD-10-CM | POA: Insufficient documentation

## 2012-12-20 DIAGNOSIS — R920 Mammographic microcalcification found on diagnostic imaging of breast: Secondary | ICD-10-CM

## 2012-12-20 HISTORY — PX: BREAST LUMPECTOMY WITH NEEDLE LOCALIZATION: SHX5759

## 2012-12-20 HISTORY — PX: BREAST BIOPSY: SHX20

## 2012-12-20 LAB — GLUCOSE, CAPILLARY: Glucose-Capillary: 129 mg/dL — ABNORMAL HIGH (ref 70–99)

## 2012-12-20 SURGERY — BREAST LUMPECTOMY WITH NEEDLE LOCALIZATION
Anesthesia: General | Site: Breast | Laterality: Right | Wound class: Clean

## 2012-12-20 MED ORDER — EPHEDRINE SULFATE 50 MG/ML IJ SOLN
INTRAMUSCULAR | Status: DC | PRN
  Administered 2012-12-20: 5 mg via INTRAVENOUS
  Administered 2012-12-20 (×2): 10 mg via INTRAVENOUS

## 2012-12-20 MED ORDER — FENTANYL CITRATE 0.05 MG/ML IJ SOLN
INTRAMUSCULAR | Status: AC
  Administered 2012-12-20: 50 ug via INTRAVENOUS
  Filled 2012-12-20: qty 2

## 2012-12-20 MED ORDER — FENTANYL CITRATE 0.05 MG/ML IJ SOLN: 25.0000 ug | INTRAMUSCULAR | Status: DC | PRN

## 2012-12-20 MED ORDER — MIDAZOLAM HCL 5 MG/5ML IJ SOLN
INTRAMUSCULAR | Status: DC | PRN
  Administered 2012-12-20 (×2): 1 mg via INTRAVENOUS

## 2012-12-20 MED ORDER — MIDAZOLAM HCL 2 MG/2ML IJ SOLN: 0.5000 mg | Freq: Once | INTRAMUSCULAR | Status: DC | PRN

## 2012-12-20 MED ORDER — 0.9 % SODIUM CHLORIDE (POUR BTL) OPTIME
TOPICAL | Status: DC | PRN
  Administered 2012-12-20: 1000 mL

## 2012-12-20 MED ORDER — ONDANSETRON HCL 4 MG/2ML IJ SOLN
INTRAMUSCULAR | Status: DC | PRN
  Administered 2012-12-20: 4 mg via INTRAVENOUS

## 2012-12-20 MED ORDER — PROMETHAZINE HCL 25 MG/ML IJ SOLN: 6.2500 mg | INTRAMUSCULAR | Status: DC | PRN

## 2012-12-20 MED ORDER — LACTATED RINGERS IV SOLN
INTRAVENOUS | Status: DC | PRN
  Administered 2012-12-20 (×2): via INTRAVENOUS

## 2012-12-20 MED ORDER — BUPIVACAINE-EPINEPHRINE 0.25% -1:200000 IJ SOLN
INTRAMUSCULAR | Status: DC | PRN
  Administered 2012-12-20: 10 mL

## 2012-12-20 MED ORDER — BUPIVACAINE-EPINEPHRINE PF 0.25-1:200000 % IJ SOLN
INTRAMUSCULAR | Status: AC
  Filled 2012-12-20: qty 60

## 2012-12-20 MED ORDER — PROPOFOL 10 MG/ML IV BOLUS
INTRAVENOUS | Status: DC | PRN
  Administered 2012-12-20: 50 mg via INTRAVENOUS
  Administered 2012-12-20: 150 mg via INTRAVENOUS

## 2012-12-20 MED ORDER — PHENYLEPHRINE HCL 10 MG/ML IJ SOLN
INTRAMUSCULAR | Status: DC | PRN
  Administered 2012-12-20: 40 ug via INTRAVENOUS

## 2012-12-20 MED ORDER — FENTANYL CITRATE 0.05 MG/ML IJ SOLN
INTRAMUSCULAR | Status: DC | PRN
  Administered 2012-12-20: 50 ug via INTRAVENOUS
  Administered 2012-12-20 (×4): 25 ug via INTRAVENOUS

## 2012-12-20 MED ORDER — LIDOCAINE HCL (CARDIAC) 20 MG/ML IV SOLN
INTRAVENOUS | Status: DC | PRN
  Administered 2012-12-20: 20 mg via INTRAVENOUS

## 2012-12-20 MED ORDER — MEPERIDINE HCL 25 MG/ML IJ SOLN: 6.2500 mg | INTRAMUSCULAR | Status: DC | PRN

## 2012-12-20 MED ORDER — ARTIFICIAL TEARS OP OINT
TOPICAL_OINTMENT | OPHTHALMIC | Status: DC | PRN
  Administered 2012-12-20: 1 via OPHTHALMIC

## 2012-12-20 MED ORDER — OXYCODONE HCL 5 MG/5ML PO SOLN: 5.0000 mg | Freq: Once | ORAL | Status: DC | PRN

## 2012-12-20 MED ORDER — OXYCODONE HCL 5 MG PO TABS: 5.0000 mg | ORAL_TABLET | Freq: Once | ORAL | Status: DC | PRN

## 2012-12-20 MED ORDER — OXYCODONE-ACETAMINOPHEN 5-325 MG PO TABS: 1.0000 | ORAL_TABLET | ORAL | Status: DC | PRN

## 2012-12-20 SURGICAL SUPPLY — 55 items
APPLIER CLIP 9.375 MED OPEN (MISCELLANEOUS)
BINDER BREAST LRG (GAUZE/BANDAGES/DRESSINGS) IMPLANT
BINDER BREAST XLRG (GAUZE/BANDAGES/DRESSINGS) IMPLANT
BINDER BREAST XXLRG (GAUZE/BANDAGES/DRESSINGS) ×2 IMPLANT
BLADE SURG 15 STRL LF DISP TIS (BLADE) ×1 IMPLANT
BLADE SURG 15 STRL SS (BLADE) ×1
BLADE SURG ROTATE 9660 (MISCELLANEOUS) IMPLANT
CANISTER SUCTION 2500CC (MISCELLANEOUS) IMPLANT
CHLORAPREP W/TINT 26ML (MISCELLANEOUS) ×2 IMPLANT
CLIP APPLIE 9.375 MED OPEN (MISCELLANEOUS) IMPLANT
CLOTH BEACON ORANGE TIMEOUT ST (SAFETY) ×2 IMPLANT
CONT SPEC STER OR (MISCELLANEOUS) IMPLANT
COVER SURGICAL LIGHT HANDLE (MISCELLANEOUS) ×2 IMPLANT
DECANTER SPIKE VIAL GLASS SM (MISCELLANEOUS) IMPLANT
DERMABOND ADVANCED (GAUZE/BANDAGES/DRESSINGS) ×1
DERMABOND ADVANCED .7 DNX12 (GAUZE/BANDAGES/DRESSINGS) ×1 IMPLANT
DEVICE DUBIN SPECIMEN MAMMOGRA (MISCELLANEOUS) ×4 IMPLANT
DRAPE CHEST BREAST 15X10 FENES (DRAPES) ×2 IMPLANT
ELECT CAUTERY BLADE 6.4 (BLADE) ×2 IMPLANT
ELECT REM PT RETURN 9FT ADLT (ELECTROSURGICAL) ×2
ELECTRODE REM PT RTRN 9FT ADLT (ELECTROSURGICAL) ×1 IMPLANT
GLOVE BIO SURGEON STRL SZ7 (GLOVE) ×2 IMPLANT
GLOVE BIOGEL PI IND STRL 6.5 (GLOVE) ×2 IMPLANT
GLOVE BIOGEL PI IND STRL 7.5 (GLOVE) ×1 IMPLANT
GLOVE BIOGEL PI INDICATOR 6.5 (GLOVE) ×2
GLOVE BIOGEL PI INDICATOR 7.5 (GLOVE) ×1
GLOVE ECLIPSE 6.5 STRL STRAW (GLOVE) ×2 IMPLANT
GLOVE SURG SS PI 6.5 STRL IVOR (GLOVE) ×6 IMPLANT
GOWN STRL NON-REIN LRG LVL3 (GOWN DISPOSABLE) ×10 IMPLANT
KIT BASIN OR (CUSTOM PROCEDURE TRAY) ×2 IMPLANT
KIT MARKER MARGIN INK (KITS) IMPLANT
KIT ROOM TURNOVER OR (KITS) ×2 IMPLANT
NEEDLE HYPO 25GX1X1/2 BEV (NEEDLE) ×2 IMPLANT
NS IRRIG 1000ML POUR BTL (IV SOLUTION) ×2 IMPLANT
PACK SURGICAL SETUP 50X90 (CUSTOM PROCEDURE TRAY) ×2 IMPLANT
PAD ARMBOARD 7.5X6 YLW CONV (MISCELLANEOUS) ×2 IMPLANT
PENCIL BUTTON HOLSTER BLD 10FT (ELECTRODE) ×2 IMPLANT
SPONGE LAP 18X18 X RAY DECT (DISPOSABLE) IMPLANT
SPONGE LAP 4X18 X RAY DECT (DISPOSABLE) IMPLANT
STAPLER VISISTAT 35W (STAPLE) ×2 IMPLANT
SUT MNCRL AB 4-0 PS2 18 (SUTURE) ×4 IMPLANT
SUT MON AB 5-0 PS2 18 (SUTURE) ×2 IMPLANT
SUT SILK 2 0 SH (SUTURE) ×2 IMPLANT
SUT VIC AB 2-0 SH 27 (SUTURE) ×2
SUT VIC AB 2-0 SH 27X BRD (SUTURE) ×1 IMPLANT
SUT VIC AB 2-0 SH 27XBRD (SUTURE) ×1 IMPLANT
SUT VIC AB 3-0 SH 27 (SUTURE) ×2
SUT VIC AB 3-0 SH 27X BRD (SUTURE) ×1 IMPLANT
SUT VIC AB 3-0 SH 27XBRD (SUTURE) ×1 IMPLANT
SYR BULB 3OZ (MISCELLANEOUS) ×2 IMPLANT
SYR CONTROL 10ML LL (SYRINGE) ×2 IMPLANT
TOWEL OR 17X24 6PK STRL BLUE (TOWEL DISPOSABLE) ×2 IMPLANT
TOWEL OR 17X26 10 PK STRL BLUE (TOWEL DISPOSABLE) ×2 IMPLANT
TUBE CONNECTING 12X1/4 (SUCTIONS) ×2 IMPLANT
YANKAUER SUCT BULB TIP NO VENT (SUCTIONS) ×2 IMPLANT

## 2012-12-20 NOTE — Anesthesia Preprocedure Evaluation (Addendum)
Anesthesia Evaluation  Patient identified by MRN, date of birth, ID band Patient awake    Reviewed: Allergy & Precautions, H&P , NPO status , Patient's Chart, lab work & pertinent test results, reviewed documented beta blocker date and time   History of Anesthesia Complications Negative for: history of anesthetic complications  Airway Mallampati: II TM Distance: >3 FB Neck ROM: Full    Dental  (+) Loose, Poor Dentition, Missing, Dental Advisory Given and Chipped   Pulmonary sleep apnea (mild OSA, no CPAP needed) , Current Smoker,  H/o lung ca, s/p lobectomy breath sounds clear to auscultation  Pulmonary exam normal       Cardiovascular hypertension, Pt. on medications and Pt. on home beta blockers Rhythm:Regular Rate:Normal  '12 myoview: negative for ischemia   Neuro/Psych Anxiety Depression negative neurological ROS     GI/Hepatic Neg liver ROS, GERD-  Medicated and Controlled,H/o colon ca   Endo/Other  diabetes (glu 104), Well Controlled, Type 2, Oral Hypoglycemic AgentsMorbid obesity  Renal/GU Renal diseaseH/o renal cell ca     Musculoskeletal   Abdominal (+) + obese,   Peds  Hematology   Anesthesia Other Findings   Reproductive/Obstetrics                          Anesthesia Physical Anesthesia Plan  ASA: III  Anesthesia Plan: General   Post-op Pain Management:    Induction: Intravenous  Airway Management Planned: LMA  Additional Equipment:   Intra-op Plan:   Post-operative Plan:   Informed Consent: I have reviewed the patients History and Physical, chart, labs and discussed the procedure including the risks, benefits and alternatives for the proposed anesthesia with the patient or authorized representative who has indicated his/her understanding and acceptance.   Dental advisory given  Plan Discussed with: Surgeon and CRNA  Anesthesia Plan Comments: (Plan routine monitors,  GA- LMA OK)        Anesthesia Quick Evaluation

## 2012-12-20 NOTE — Anesthesia Procedure Notes (Signed)
Procedure Name: LMA Insertion Date/Time: 12/20/2012 10:43 AM Performed by: Sherie Don Pre-anesthesia Checklist: Patient identified, Emergency Drugs available, Suction available, Patient being monitored and Timeout performed Patient Re-evaluated:Patient Re-evaluated prior to inductionOxygen Delivery Method: Circle system utilized Preoxygenation: Pre-oxygenation with 100% oxygen Intubation Type: IV induction Ventilation: Mask ventilation without difficulty LMA: LMA inserted and LMA with gastric port inserted LMA Size: 4.0 Number of attempts: 1 Placement Confirmation: ETT inserted through vocal cords under direct vision,  positive ETCO2 and breath sounds checked- equal and bilateral Tube secured with: Tape Dental Injury: Teeth and Oropharynx as per pre-operative assessment

## 2012-12-20 NOTE — Anesthesia Postprocedure Evaluation (Signed)
  Anesthesia Post-op Note  Patient: Amy [name]  Procedure(s) Performed: Procedure(s): BREAST LUMPECTOMY WITH NEEDLE LOCALIZATION (Right) Right breast Excisional Biopsy (Right)  Patient Location: PACU  Anesthesia Type:General  Level of Consciousness: awake, alert , oriented and patient cooperative  Airway and Oxygen Therapy: Patient Spontanous Breathing  Post-op Pain: none  Post-op Assessment: Post-op Vital signs reviewed, Patient's Cardiovascular Status Stable, Respiratory Function Stable, Patent Airway, No signs of Nausea or vomiting and Pain level controlled  Post-op Vital Signs: Reviewed and stable  Complications: No apparent anesthesia complications

## 2012-12-20 NOTE — Transfer of Care (Signed)
Immediate Anesthesia Transfer of Care Note  Patient: Amy [name]  Procedure(s) Performed: Procedure(s): BREAST LUMPECTOMY WITH NEEDLE LOCALIZATION (Right) Right breast Excisional Biopsy (Right)  Patient Location: PACU  Anesthesia Type:General  Level of Consciousness: sedated and patient cooperative  Airway & Oxygen Therapy: Patient Spontanous Breathing and Patient connected to face mask oxygen  Post-op Assessment: Report given to PACU RN, Post -op Vital signs reviewed and stable and Patient moving all extremities X 4  Post vital signs: Reviewed and stable  Complications: No apparent anesthesia complications

## 2012-12-20 NOTE — Preoperative (Signed)
Beta Blockers   Reason not to administer Beta Blockers:Lopressor this am 12/20/12

## 2012-12-20 NOTE — Op Note (Signed)
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 Harden Mo Anesthesia: Gen. Estimated blood loss: Minimal Specimens: #1 right breast papilloma excision marked short stitch superior, long stitch lateral, double stitch deep #2 right breast DCIS excision marked with paint #3 additional margin for DCIS specimen this inferior and lateral and is marked with a short stitch superior, long stitch lateral, double stitch deeo Drains: None Complications: None Sponge needle count correct x2 at operation Disposition to recovery in stable condition  Indications: This is a 67 year old female smoker who has had a prior colon cancer, renal cell cancer, lung cancer who I know from a new colon cancer status post a partial colectomy recently. When she healed from that she would to get her regular screening mammogram and was found to have calcifications as well as a mass in the right retroareolar position. The retroareolar mass of papilloma that is recommended for excision and the calcifications are ductal carcinoma in situ. She and I discussed all of her different options and she was seen by radiation and medical oncology. She has elected to undergo an excisional biopsy of the benign lesion and a lumpectomy of the DCIS. She understands the risks and benefits associated with this.  Procedure: After informed consent was obtained the patient was taken to breast center where she had wires placed in both lesions. I had these mammograms in the operating room. She was administered 2 g of intravenous cefazolin. Sequential compression devices were placed for DVT prophylaxis. She was placed under general anesthesia. Her right breast was prepped and draped in the standard sterile  surgical fashion. Surgical timeout was then performed.  I then made a periareolar incision to excise the retroareolar mass. Cautery was then used to excise the wire and the surrounding tissue. I brought the wiring from a remote location. Once I did this I obtained hemostasis. I marked this was stitch as above. I then did a Faxitron mammogram and confirmed removal of the clip and the lesion. This was also confirmed by radiology. I then closed this with 2-0 Vicryl for the breast tissue. I closed the dermis with 3-0 Vicryl and the skin with 5-0 Monocryl.  Her prior core biopsy of the DCIS had an ulcerated lesion right in the middle of this. I decided to excise this when I did the procedure. I made an elliptical incision this around the wire as well as his ulcerated lesion. I then used cautery to excise the wire and the surrounding tissue with an attempt to get negative margins. A mammogram was taken confirming removal of the clip, residual calcifications, and wire. I thought one margin might be close I did excise an additional amount of tissue in this position. This was all confirmed by radiology. I then placed 2 clips deep and one clip in each position around the cavity. I obtained hemostasis. I then closed the breast tissue with 2-0 Vicryl. The dermis was closed with 3-0 Vicryl the skin with 4-0 Monocryl. I placed Dermabond and Steri-Strips over both of these wounds. I infiltrated Marcaine throughout both of these areas. She tolerated this well a breast binder was placed and she was transferred to recovery in stable condition.

## 2012-12-20 NOTE — H&P (Signed)
HPI  43 yof who I know well from a partial colectomy for colon cancer for which it took a long time to recover. She has history of numerous cancer. After this she got her screening mammogram which showed right sided calcifications. 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 ER/PR positive at 100%. She then had MR which showed postbiopsy changes as well as 3x1.4x0.8 cm right sided breast mass. This underwent US guided biopsy that showed intraductal papilloma and has been recommended for excision. We discussed lumpectomy and excisional biopsy. She wanted to wait to have surgery due to recent colectomy.  Past Medical History   Diagnosis  Date   .  Depression    .  Diabetes mellitus      diet controlled   .  Anemia    .  Anxiety    .  Arthritis    .  Hypertension    .  Hernia      Near umbilicus   .  Hemorrhoids    .  Heart murmur    .  Cancer      colon,renal,lung   .  Sleep apnea      mild no cpap need has been tested in the pass score was 11   .  Breast cancer, right breast     Past Surgical History   Procedure  Laterality  Date   .  Colonoscopy     .  Polypectomy     .  Lung lobectomy       right lower   .  Colon surgery       chemo   .  Kidney surgery   1999     right/chemo kidney removed for cancer   .  Partial hysterectomy     .  Breast biopsy       Bilateral cysts/benign   .  Colectomy   05-31-2012     laparoscopic ,possible open   .  Abdominal hysterectomy   80's   .  Tonsillectomy       as child   .  Partial colectomy   05/31/2012     Procedure: PARTIAL COLECTOMY; Surgeon: Emelia Loron, MD; Location: [facility]; Service: General; Laterality: N/A;    Family History   Problem  Relation  Age of Onset   .  Colon polyps  Brother    .  Breast cancer  Mother    .  Cancer  Mother      breast   .  Heart disease  Father    .  Colon cancer  Neg Hx    .  Esophageal cancer  Neg Hx    .  Rectal cancer  Neg Hx    .  Stomach cancer  Neg Hx    Social  History  History   Substance Use Topics   .  Smoking status:  Current Every Day Smoker -- 1.00 packs/day for 18 years     Types:  Cigarettes   .  Smokeless tobacco:  Never Used   .  Alcohol Use:  No    Allergies   Allergen  Reactions   .  Iodine  Anaphylaxis   .  Iohexol      Code: HIVES, Desc: pt states , swelling mouth , throat, and has difficulty breathing    Current Outpatient Prescriptions   Medication  Sig  Dispense  Refill   .  ALPRAZolam (XANAX) 1 MG tablet  Take 1 mg  by mouth at bedtime as needed. sleep     .  Ascorbic Acid (VITAMIN C WITH ROSE HIPS) 500 MG tablet  Take 500 mg by mouth daily.     Marland Kitchen  atorvastatin (LIPITOR) 20 MG tablet  Take 20 mg by mouth daily before breakfast.     .  Calcium Carbonate-Vitamin D (CALCIUM-VITAMIN D) 500-200 MG-UNIT per tablet  Take 1 tablet by mouth 2 (two) times daily with a meal.     .  Ezetimibe (ZETIA PO)  Take by mouth.     .  Ferrous Gluconate (IRON) 240 (27 FE) MG TABS  Take 1 tablet by mouth daily.     .  hydrochlorothiazide 25 MG tablet  Take 1 tablet by mouth daily before breakfast.     .  HYDROcodone-acetaminophen (NORCO/VICODIN) 5-325 MG per tablet  Take 1-2 tablets by mouth every 4 (four) hours as needed for pain.  30 tablet  0   .  lisinopril (PRINIVIL,ZESTRIL) 10 MG tablet  Take 10 mg by mouth daily before breakfast.     .  metoprolol (LOPRESSOR) 100 MG tablet  Take 1 tablet by mouth daily before breakfast.     .  Multiple Vitamin (MULTIVITAMIN WITH MINERALS) TABS  Take 1 tablet by mouth daily.     Marland Kitchen  omeprazole (PRILOSEC) 20 MG capsule  Take 20 mg by mouth daily.     Marland Kitchen  PARoxetine (PAXIL) 40 MG tablet  Take 1 tablet by mouth daily before breakfast.     .  sitaGLIPtin (JANUVIA) 100 MG tablet  Take 100 mg by mouth daily.     .  traMADol (ULTRAM) 50 MG tablet  Take 1 tablet by mouth as needed. Pain      No current facility-administered medications for this visit.   Review of Systems  Review of Systems  Blood pressure 132/72,  pulse 84, resp. rate 18, height 5\' 6"  (1.676 m), weight 246 lb (111.585 kg).  Physical Exam  Physical Exam  Vitals reviewed.  Constitutional: She appears well-developed and well-nourished.  Pulmonary/Chest: Right breast exhibits no inverted nipple, no mass, no nipple discharge, no skin change and no tenderness. Left breast exhibits no inverted nipple, no mass, no nipple discharge, no skin change and no tenderness. Breasts are symmetrical.    Lymphadenopathy:  She has no cervical adenopathy.  She has no axillary adenopathy.  Right: No supraclavicular adenopathy present.  Left: No supraclavicular adenopathy present.  Data Reviewed  DIGITAL DIAGNOSTIC RIGHT MAMMOGRAM  Comparison: 09/19/2012, 07/29/2011, dating back to 05/30/2006.  Findings:  ACR Breast Density Category 2: There is a scattered fibroglandular  pattern.  Spot magnification CC and MLO views of the calcifications in the  upper outer right breast, anterior third, were obtained. There is  a cluster of calcifications of varying shapes and sizes which span  approximately 2.1 cm in a linear distribution.  IMPRESSION:  Suspicious microcalcifications in the upper outer right breast,  anterior third.  RECOMMENDATION:  Stereotactic core needle biopsy of these calcifications was  discussed with the patient. She has agreed to proceed. This has  been scheduled for tomorrow, March 12, at 2 o'clock p.m.  Depending on the results of the biopsy, the patient's nipple  discharge may require further imaging workup with ductography.  I have discussed the findings and recommendations with the patient.  Results were also provided in writing at the conclusion of the  visit.  BI-RADS CATEGORY 4: Suspicious abnormality - biopsy should be  considered.  Assessment  Right breast DCIS  Right breast papilloma  Plan  We discussed the staging and pathophysiology of breast cancer. We discussed all of the different options for treatment for breast  cancer including surgery, chemotherapy, radiation therapy, Herceptin, and antiestrogen therapy.  We discussed the role of snbx. I think this might be reasonable to omit if agreed to by med onc and rad onc. There certainly is possibility that she could have invasive disease and could decide later. That would mean a second operation but I think with her cormorbidities she has decent chance of having upper extremity issues postop. Will plan on waiting on this.  We discussed the options for treatment of the breast cancer which included lumpectomy versus a mastectomy. We discussed the performance of the lumpectomy with a wire placement. We discussed a 10-20% chance of a positive margin requiring reexcision in the operating room. We also discussed that she may need radiation therapy or antiestrogen therapy or both if she undergoes lumpectomy. We discussed the mastectomy and the postoperative care for that as well. We discussed that there is no difference in her survival whether she undergoes lumpectomy with radiation therapy or antiestrogen therapy versus a mastectomy. There is a slight difference in the local recurrence rate being 3-5% with lumpectomy and about 1% with a mastectomy.  We discussed the risks of operation including bleeding, infection, possible reoperation. She understands her further therapy will be based on what her stages at the time of her operation.  She does not need a mastectomy. The dcis can be treated with lumpectomy and the papilloma has been recommended for excision. Her breast size is such that this could be done easily. The issue is xrt after this given continued smoking and prior lung cancer.  She has been seen by rad onc and med onc. I think the most reasonable plan for her would be lumpectomy, papilloma excision and then review path. Hopefully she could just go on antiestrogen after that.

## 2012-12-21 ENCOUNTER — Encounter (HOSPITAL_COMMUNITY): Payer: Self-pay | Admitting: General Surgery

## 2012-12-24 ENCOUNTER — Telehealth (INDEPENDENT_AMBULATORY_CARE_PROVIDER_SITE_OTHER): Payer: Self-pay | Admitting: General Surgery

## 2012-12-24 NOTE — Telephone Encounter (Signed)
Appt made

## 2012-12-24 NOTE — Telephone Encounter (Signed)
Message copied by Liliana Cline on Mon Dec 24, 2012  1:11 PM ------      Message from: Marin Shutter      Created: Mon Dec 24, 2012  1:01 PM      Regarding: Dr. Dwain Sarna      Contact: [phone]       Pt had a lumpy on 5/29.  She needs a 1st p/o.  My first avail was 7/1.  Deanna I CCed you because Elease Hashimoto is on vaca, and I didn't know if [name] took Dr. Kristine Royal pts.  Thx ------

## 2012-12-25 ENCOUNTER — Telehealth: Payer: Self-pay | Admitting: Oncology

## 2012-12-26 ENCOUNTER — Other Ambulatory Visit: Payer: Self-pay | Admitting: Medical Oncology

## 2012-12-26 DIAGNOSIS — C50911 Malignant neoplasm of unspecified site of right female breast: Secondary | ICD-10-CM

## 2012-12-27 ENCOUNTER — Telehealth: Payer: Self-pay | Admitting: Oncology

## 2012-12-27 ENCOUNTER — Other Ambulatory Visit (HOSPITAL_BASED_OUTPATIENT_CLINIC_OR_DEPARTMENT_OTHER): Payer: Medicare Other | Admitting: Lab

## 2012-12-27 ENCOUNTER — Ambulatory Visit (HOSPITAL_BASED_OUTPATIENT_CLINIC_OR_DEPARTMENT_OTHER): Payer: Medicare Other | Admitting: Oncology

## 2012-12-27 ENCOUNTER — Encounter: Payer: Self-pay | Admitting: Oncology

## 2012-12-27 ENCOUNTER — Telehealth (INDEPENDENT_AMBULATORY_CARE_PROVIDER_SITE_OTHER): Payer: Self-pay | Admitting: General Surgery

## 2012-12-27 VITALS — BP 129/81 | HR 81 | Temp 98.6°F | Resp 20 | Ht 65.0 in | Wt 244.3 lb

## 2012-12-27 DIAGNOSIS — C50911 Malignant neoplasm of unspecified site of right female breast: Secondary | ICD-10-CM

## 2012-12-27 DIAGNOSIS — Z85038 Personal history of other malignant neoplasm of large intestine: Secondary | ICD-10-CM

## 2012-12-27 DIAGNOSIS — D0590 Unspecified type of carcinoma in situ of unspecified breast: Secondary | ICD-10-CM

## 2012-12-27 DIAGNOSIS — D0511 Intraductal carcinoma in situ of right breast: Secondary | ICD-10-CM

## 2012-12-27 DIAGNOSIS — Z170 Estrogen receptor positive status [ER+]: Secondary | ICD-10-CM

## 2012-12-27 LAB — CBC WITH DIFFERENTIAL/PLATELET
BASO%: 0.5 % (ref 0.0–2.0)
Basophils Absolute: 0 10*3/uL (ref 0.0–0.1)
HCT: 41.4 % (ref 34.8–46.6)
HGB: 13.9 g/dL (ref 11.6–15.9)
MCHC: 33.6 g/dL (ref 31.5–36.0)
MONO#: 0.4 10*3/uL (ref 0.1–0.9)
NEUT%: 61.9 % (ref 38.4–76.8)
WBC: 6.5 10*3/uL (ref 3.9–10.3)
lymph#: 1.8 10*3/uL (ref 0.9–3.3)

## 2012-12-27 LAB — COMPREHENSIVE METABOLIC PANEL (CC13)
ALT: 12 U/L (ref 0–55)
Albumin: 3.5 g/dL (ref 3.5–5.0)
CO2: 35 mEq/L — ABNORMAL HIGH (ref 22–29)
Calcium: 10.3 mg/dL (ref 8.4–10.4)
Chloride: 100 mEq/L (ref 98–107)
Creatinine: 1 mg/dL (ref 0.6–1.1)
Potassium: 3.9 mEq/L (ref 3.5–5.1)
Total Protein: 7.2 g/dL (ref 6.4–8.3)

## 2012-12-27 NOTE — Telephone Encounter (Signed)
Called patient and spoke with her about pathology results. She was under the impression she would not need radiation and I tried to explain that it was standard practice to have radiation after a lumpectomy. She thought that if she took the anti-estrogen pill for five years she would not need radiation. I tried to explain the difference between the treatments. That radiation takes care of the tumor now and the anti-estrogen pill would help prevent a future tumor. She expressed understanding and is aware Dr Dwain Sarna will explain things again when he sees her on 01/08/2013. She will call with any problems prior.

## 2012-12-27 NOTE — Telephone Encounter (Signed)
Message copied by Liliana Cline on Thu Dec 27, 2012  9:20 AM ------      Message from: Dwain Sarna, MATTHEW      Created: Wed Dec 26, 2012  7:24 PM       [name],      I tried to call a couple times today with her path. No answer.  Left a message for her to call.  Im out until Monday.  Would you please call her and tell her this is dcis, no invasive tumor.  One of margins is close but I think we can just go to radiation.  There is small possibility of needing reexcision.  I have emailed radiation oncology and med once about what they think.      Thanks,      MW ------

## 2012-12-27 NOTE — Telephone Encounter (Signed)
, °

## 2012-12-27 NOTE — Patient Instructions (Addendum)
Follow up with Dr. Dwain Sarna for discussion of possible re-excision of close margin  I have set you up for Dr. Michell Heinrich For follow up and eventual radiation therapy  I will see you back in 3 months, at that time we will discuss anti-estrogen therapy

## 2012-12-28 ENCOUNTER — Ambulatory Visit: Payer: Medicare Other | Admitting: Oncology

## 2012-12-28 ENCOUNTER — Other Ambulatory Visit: Payer: Medicare Other

## 2013-01-08 ENCOUNTER — Ambulatory Visit (INDEPENDENT_AMBULATORY_CARE_PROVIDER_SITE_OTHER): Payer: Medicare Other | Admitting: General Surgery

## 2013-01-08 ENCOUNTER — Encounter (INDEPENDENT_AMBULATORY_CARE_PROVIDER_SITE_OTHER): Payer: Self-pay | Admitting: General Surgery

## 2013-01-08 VITALS — BP 130/90 | HR 82 | Temp 98.2°F | Resp 16 | Ht 66.0 in | Wt 245.4 lb

## 2013-01-08 DIAGNOSIS — D0590 Unspecified type of carcinoma in situ of unspecified breast: Secondary | ICD-10-CM

## 2013-01-08 DIAGNOSIS — D0511 Intraductal carcinoma in situ of right breast: Secondary | ICD-10-CM

## 2013-01-10 ENCOUNTER — Encounter: Payer: Self-pay | Admitting: Radiation Oncology

## 2013-01-10 NOTE — Progress Notes (Signed)
Location of Breast Cancer- Breast Clinic seen 10/19/12 :10/03/12 biopsy Right Breast,upper central   right, needle core biopsy, upper central- DUCTAL CARCINOMA IN SITU WITH CALCIFICATIONS AND COMEDO-TYPE NECROSIS, SEE Histology per Pathology Report: 10/03/12 Receptor Status: ER(+), PR (+), Her2-neu ()  Did patient present with symptoms (if so, please note symptoms) or was this found on screening mammography?: Mammogram 09/2012 Past/Anticipated interventions by surgeon, if any 12/20/12:  :1. Breast, lumpectomy, Right- SCLEROSED INTRADUCTAL PAPILLOMA SEE COMMENT.- NEGATIVE FOR ATYPIA OR MALIGNANCY.- PREVIOUS BIOPSY SITE CHANGE.- MICROCALCIFICATIONS IDENTIFIED.2. Breast, lumpectomy, Right- DUCTAL CARCINOMA IN SITU, SEE COMMENT.- IN SITU CARCINOMA IS 1 MM FROM NEAREST SUPERIOR AND INFERIOR MARGIN.-SEETUMORTEMPLATE BELOW.3. Breast, excision, Right- BENIGN BREAST TISSUE, SEE COMMENT.- NEGATIVE FOR ATYPIA OR MALIGNANCY.- MICROCALCIFICATIONS IDENTIFIED.- SURGICAL MARGIN, NEGATIVE FOR ATYPIA OR MALIGNANCY, Dr.Matthew Dwain Sarna   Past/Anticipated interventions by medical oncology, if any: Chemotherapy ,1999 for kidney cancer(adjuvant 5-fu,leucovorin  Lymphedema issues, if any: No Pain issues, if any:   SAFETY ISSUES:  Prior radiation? no  Pacemaker/ICD? no  Possible current pregnancy?no  Is the patient on methotrexate? no  Current Complaints / other details:  Widowed,  Smokes ,1/2PPD RESTARTED 2 YEARS AGO Patient hx colon cancer,1999,2013,,III b  s/p partial colectomy(05/31/2012), Non-small lung cancer,(2001) right lung Vats,   Kidney cancer  Stage [phone], Sleep apnea,  Breast cancer in mother,  Brother  Prostate/,cancer brother, colon polyps &( multiple myeloma has chemotherapy here on Thursdays,)  heart disease in father, 55 y. Son deceased age 19, [place]  Allergies:Iodine and lohexol =Hives,swelling mouth,throat,diff breathing    [place] Grayer, Gloriann Loan, RN 01/10/2013,2:51 PM

## 2013-01-10 NOTE — Progress Notes (Signed)
Subjective:     Patient ID: Amy [name], female   DOB: 12-26-1945, 67 y.o.   MRN: [id number]  HPI 18 yof well known to me from prior colon cancer and with extensive oncologic history who underwent right breast excision and a right breast wire guided lumpectomy recently. She has done well and returns today without complaint.  Her pathology of excisional biopsy is a benign lesion.  The lumpectomy is dcis with two margins that are 1 mm.  I excised the inferior margin so this is well clear but superior margin is 1 mm.  She comes in today to discuss her pathology.  Review of Systems     Objective:   Physical Exam Well healed incisions right breast x 2 without infection    Assessment:     S/p right breast excision S/p right breast lumpectomy with dcis    Plan:     She is doing well and without complaint. She can return to normal activity. I discussed several options with her today.  These include: 1. Observation which I don't think is good option 2. Radiation therapy which I think would be reasonable to proceed with and omit surgery 3. Reexcise margin- 1-10 mm margin by nccn guidelines is reasonable  4. Antiestrogen therapy 5. Combination of above. She will see Dr Michell Heinrich on Friday and then we can decide in multidisciplinary fashion.

## 2013-01-11 ENCOUNTER — Encounter: Payer: Self-pay | Admitting: Radiation Oncology

## 2013-01-11 ENCOUNTER — Ambulatory Visit
Admission: RE | Admit: 2013-01-11 | Discharge: 2013-01-11 | Disposition: A | Discharge status: Home / Self Care | Payer: Medicare Other | Source: Ambulatory Visit | Attending: Radiation Oncology | Admitting: Radiation Oncology

## 2013-01-11 ENCOUNTER — Other Ambulatory Visit (INDEPENDENT_AMBULATORY_CARE_PROVIDER_SITE_OTHER): Payer: Self-pay | Admitting: General Surgery

## 2013-01-11 VITALS — BP 122/84 | HR 76 | Temp 98.6°F | Resp 22 | Ht 66.0 in | Wt 250.3 lb

## 2013-01-11 DIAGNOSIS — D0590 Unspecified type of carcinoma in situ of unspecified breast: Secondary | ICD-10-CM | POA: Insufficient documentation

## 2013-01-11 DIAGNOSIS — Z79899 Other long term (current) drug therapy: Secondary | ICD-10-CM | POA: Insufficient documentation

## 2013-01-11 DIAGNOSIS — C50911 Malignant neoplasm of unspecified site of right female breast: Secondary | ICD-10-CM

## 2013-01-11 DIAGNOSIS — F17200 Nicotine dependence, unspecified, uncomplicated: Secondary | ICD-10-CM | POA: Insufficient documentation

## 2013-01-11 HISTORY — DX: Allergy, unspecified, initial encounter: T78.40XA

## 2013-01-11 HISTORY — DX: Gastro-esophageal reflux disease without esophagitis: K21.9

## 2013-01-11 HISTORY — DX: Myoneural disorder, unspecified: G70.9

## 2013-01-11 HISTORY — DX: Unspecified cataract: H26.9

## 2013-01-11 HISTORY — DX: Reserved for concepts with insufficient information to code with codable children: IMO0002

## 2013-01-11 HISTORY — DX: Encounter for other specified aftercare: Z51.89

## 2013-01-11 HISTORY — DX: Chronic kidney disease, unspecified: N18.9

## 2013-01-11 NOTE — Addendum Note (Signed)
Encounter addended by: Lowella Petties, RN on: 01/11/2013 10:14 AM<BR>     Documentation filed: Visit Diagnoses

## 2013-01-11 NOTE — Progress Notes (Signed)
Department of Radiation Oncology  Phone:  [phone] Fax:        [phone]   Name: LATRENA BENEGAS MRN: [id number]  DOB: 11/05/1945  Date: 01/11/2013  Follow Up Visit Note  Diagnosis: DCIS of the right breast grade 2 100% ER +100% PR positive closest margin at 1 mm superiorly  Interval History: Lisha presents today for routine followup.  She had her surgery on may 29th. She is actually healed up really well from that. He met with Dr. Welton Flakes and discussed antiestrogen therapy. She recently met with Dr. Dwain Sarna to discuss reexcision of the surgical margin. He wanted her to talk to me again about radiation in case she decided not to have reexcision of margin. She continues to smoke. She is struggling with why these cancers keep happening to her.  Allergies:  Allergies  Allergen Reactions  . Iodine Anaphylaxis  . Iohexol      Code: HIVES, Desc: pt states , swelling mouth , throat, and has difficulty breathing     Medications:  Current Outpatient Prescriptions  Medication Sig Dispense Refill  . ALPRAZolam (XANAX) 1 MG tablet Take 0.5 mg by mouth at bedtime.       . Ascorbic Acid (VITAMIN C WITH ROSE HIPS) 500 MG tablet Take 500 mg by mouth daily.        Marland Kitchen atorvastatin (LIPITOR) 20 MG tablet Take 20 mg by mouth daily before breakfast.       . Calcium Carbonate-Vitamin D (CALCIUM-VITAMIN D) 500-200 MG-UNIT per tablet Take 1 tablet by mouth daily after breakfast.       . ezetimibe (ZETIA) 10 MG tablet Take 10 mg by mouth daily.      . Ferrous Gluconate (IRON) 240 (27 FE) MG TABS Take 1 tablet by mouth daily.       . hydrochlorothiazide 25 MG tablet Take 25 mg by mouth daily before breakfast.       . lisinopril (PRINIVIL,ZESTRIL) 10 MG tablet Take 10 mg by mouth daily before breakfast.       . metFORMIN (GLUCOPHAGE) 500 MG tablet Take 500 mg by mouth daily with breakfast.       . metoprolol (LOPRESSOR) 100 MG tablet Take 100 mg by mouth daily before breakfast.       . Multiple  Vitamins-Minerals (CENTRUM SILVER ADULT 50+ PO) Take 1 tablet by mouth daily.      Marland Kitchen omeprazole (PRILOSEC) 20 MG capsule Take 20 mg by mouth daily.        Marland Kitchen PARoxetine (PAXIL) 40 MG tablet Take 40 mg by mouth daily before breakfast.       . traMADol (ULTRAM) 50 MG tablet Take 50 mg by mouth every 6 (six) hours as needed for pain. Pain       No current facility-administered medications for this encounter.    Physical Exam:  Filed Vitals:   01/11/13 0928  BP: 122/84  Pulse: 76  Temp: 98.6 F (37 C)  Resp: 22   shows a well-healed periareolar incision on the right breast. There is no evidence of erythema or infection.  IMPRESSION: Amy [name] is a 67 y.o. female status post right lumpectomy for DCIS of the right breast  PLAN:  I spoke with married for 30 minutes today regarding again her options. We discussed the standard approach for DCIS in a woman her age was surgical excision of this close margin followed by radiation followed by antiestrogen therapy. We discussed that the guidelines for DCIS margins are greater than  1 mm but less than 1 cm. Between 1 mm 1 cm no instillation or what the adequate margin it is. We discussed that a close margin increases the risk of local recurrence in the breast. We discussed that half of these recurrences are DCIS and half are invasive. We discussed that DCIS rarely recurs as widely metastatic breast cancer. We discussed the process of simulation the placement tattoos. We discussed 6 weeks of treatment as an outpatient. I do not believe she is a candidate for hyperfractionated treatment due to her large breast size. We discussed possible side effects including but not limited to fatigue, skin irritation, possible blisters in her inframammary fold, and permanent or temporary skin darkening. We discussed the possibility of secondary cancers including lung cancer given her continued tobacco use. At the end of our discussion she was pretty sure she would have reexcision and  then take antiestrogen therapy. We discussed if she did not do radiation and had a recurrence within the breast years down the road she could have reexcision of this area and it would have to have radiation at that time. I think she would tolerate radiation fine with of course the fairly significant skin reaction during treatment and of course the long-term possibility of lung cancer but compared to the other interventions that she's had it would be relatively tolerable. I spoke to Dr. Dwain Sarna and he is going to have his nurse contact her. I told her I didn't really think that she could make a bad decision in this situation except to do nothing. I told her she probably needed either to re\re excise the margin and have antiestrogen therapy or to proceed on with radiation at this time. She has a good grasp on what going on and was going to think about it over the weekend. I gave her my number and asked her to call me if she decided she ultimately wanted to proceed forward with radiation.  Lurline Hare, MD

## 2013-01-11 NOTE — Progress Notes (Signed)
Please see the Nurse Progress Note in the MD Initial Consult Encounter for this patient. 

## 2013-01-14 NOTE — Addendum Note (Signed)
Encounter addended by: Lowella Petties, RN on: 01/14/2013  3:59 PM<BR>     Documentation filed: Charges VN

## 2013-01-15 ENCOUNTER — Encounter (HOSPITAL_COMMUNITY): Payer: Self-pay | Admitting: *Deleted

## 2013-01-15 ENCOUNTER — Encounter (HOSPITAL_COMMUNITY): Payer: Self-pay | Admitting: Pharmacy Technician

## 2013-01-20 NOTE — Progress Notes (Signed)
OFFICE PROGRESS NOTE  CC  POLITE,[name], MD [address] [place] [address] Dr. Emelia Loron Dr. 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 female whose cancer history began in 1999 when she was diagnosed with a IIIB colon cancer. She underwent a colectomy followed by 6 months of chemotherapy under the care of Dr. Cleone Slim  #2 She had a T2 N0 renal cell carcinoma the right kidney in about that same time. In November 2001 she had a T1 N0 non-small cell lung cancer removed from the right lung with a VATS procedure by Dr. Edwyna Shell. She has been monitored with serial colonoscopies and has had several adenomas removed. She was seen at the [facility] genetics clinic but was not tested that I can see. She says she is in some sort of study looking at her and as well as the rest of her family due to the high rate of cancers there.   #3 A colonoscopy in September of last year showed an ulcerative mass in the transverse colon. Biopsy revealed polyps that were negative for dysplasia. Surgical excision was recommended. 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 Margins were negative. 0/9 lymph nodes were positive. No adjuvant chemotherapy was recommended. She then underwent her screening mammogram in March of this year  #4 She then underwent her screening mammogram in March of this year. New calcifications were noted. 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 This was ER positive at 100% PR positive at 100%. Followup MRI revealed with artifact as well as a 3.2 x 1.4 x 0.8 cm mass in the upper and her region of the right breast. This had features suspicious for a papillary neoplasm. Ultrasound is  recommended. Ultrasound-guided biopsy of this area revealed an intraductal papilloma  #5 She met with Dr. Dwain Sarna and discussed surgical options. They discussed mastectomy versus lumpectomy. She did have difficulties with wound healing after her colectomy and actually still has a bandage in place for this. She has been on disability since 2000 due to her multiple medical problems as well as her multiple cancers. She still has some pain from her biopsy  #6 patient is now status post lumpectomy with sentinel lymph node biopsy of the right breast performed on 12/20/2012. The final pathology revealed ductal carcinoma in situ nodes were negative on all pathologic staging is Tis N0, stage 0. Patient's case was discussed at the multidisciplinary breast clinic she is a good candidate for radiation therapy followed by adjuvant antiestrogen therapy. There was a questionable margin. I discussed this with the patient. She at this time is willing to do whatever we recommend and I do think that she should undergo reexcision of the surgical margin.  CURRENT THERAPY: proceed with surgery  INTERVAL HISTORY: Amy [name] 67 y.o. female returns for followup after her surgery. Overall she's doing well. She does have a close surgical margin. I do think she should have this reexcised. We also discussed role of antiestrogen therapy and radiation. She will meet with Dr. Lurline Hare in followup as well.  MEDICAL HISTORY: Past Medical History  Diagnosis Date  . Depression   . Diabetes mellitus     diet controlled  . Anemia   . Anxiety   .  Arthritis   . Hernia     Near umbilicus  . Hemorrhoids   . Heart murmur   . Cancer     colon,renal,lung  . Lung cancer 2001  . Colon cancer 1999, 2013  . Breast cancer, right breast 10/03/12  . Hypertension     Does not see a cardiologist  . Allergy   . Blood transfusion without reported diagnosis 1999    prbc S/P COLON SURGERY  . Cataract     NO SURGERY  . GERD  (gastroesophageal reflux disease)   . Chronic kidney disease 1999    RIGHT KIDNEY REMOVED  . Neuromuscular disorder     PERIPHERAL NEUROPATHY FEET  . Ulcer     HX YEARS AGO  . Shortness of breath     with exertion     ALLERGIES:  is allergic to iodine; iohexol; adhesive; and latex.  MEDICATIONS:  Current Outpatient Prescriptions  Medication Sig Dispense Refill  . ALPRAZolam (XANAX) 1 MG tablet Take 0.5 mg by mouth at bedtime.       . Ascorbic Acid (VITAMIN C WITH ROSE HIPS) 500 MG tablet Take 500 mg by mouth every morning.       Marland Kitchen atorvastatin (LIPITOR) 20 MG tablet Take 20 mg by mouth daily before breakfast.       . Calcium Carbonate-Vitamin D (CALCIUM-VITAMIN D) 500-200 MG-UNIT per tablet Take 1 tablet by mouth daily after breakfast.       . ezetimibe (ZETIA) 10 MG tablet Take 10 mg by mouth every morning.       . Ferrous Gluconate (IRON) 240 (27 FE) MG TABS Take 1 tablet by mouth every morning.       . hydrochlorothiazide 25 MG tablet Take 25 mg by mouth daily before breakfast.       . lisinopril (PRINIVIL,ZESTRIL) 10 MG tablet Take 10 mg by mouth daily before breakfast.       . metoprolol (LOPRESSOR) 100 MG tablet Take 100 mg by mouth daily before breakfast.       . omeprazole (PRILOSEC) 20 MG capsule Take 20 mg by mouth every morning.       Marland Kitchen PARoxetine (PAXIL) 40 MG tablet Take 40 mg by mouth daily before breakfast.       . traMADol (ULTRAM) 50 MG tablet Take 50 mg by mouth every 6 (six) hours as needed for pain.       . metFORMIN (GLUCOPHAGE) 500 MG tablet Take 500 mg by mouth daily with breakfast.       . Multiple Vitamin (MULTIVITAMIN WITH MINERALS) TABS Take 1 tablet by mouth every morning. She takes Surveyor, quantity Adult 50+.       No current facility-administered medications for this visit.    SURGICAL HISTORY:  Past Surgical History  Procedure Laterality Date  . Colonoscopy    . Polypectomy    . Lung lobectomy      right lower  . Colon surgery      chemo  .  Kidney surgery  1999    right/chemo kidney removed for cancer  . Partial hysterectomy    . Breast biopsy      Bilateral cysts/benign  . Colectomy  05-31-2012    laparoscopic ,possible open  . Abdominal hysterectomy  80's    fibroids  . Tonsillectomy      as child  . Partial colectomy  05/31/2012    Procedure: PARTIAL COLECTOMY;  Surgeon: Emelia Loron, MD;  Location: [facility] ORS;  Service: General;  Laterality: N/A;  . Breast biopsy Right 10/03/12    ER/PR +  . Breast lumpectomy with needle localization Right 12/20/2012    Procedure: BREAST LUMPECTOMY WITH NEEDLE LOCALIZATION;  Surgeon: Emelia Loron, MD;  Location: [facility] OR;  Service: General;  Laterality: Right;  . Breast biopsy Right 12/20/2012    Procedure: Right breast Excisional Biopsy;  Surgeon: Emelia Loron, MD;  Location: [facility] OR;  Service: General;  Laterality: Right;    REVIEW OF SYSTEMS:  A comprehensive review of systems was negative.   HEALTH MAINTENANCE:  PHYSICAL EXAMINATION: Blood pressure 129/81, pulse 81, temperature 98.6 F (37 C), temperature source Oral, resp. rate 20, height 5\' 5"  (1.651 m), weight 244 lb 4.8 oz (110.814 kg). Body mass index is 40.65 kg/(m^2).  ECOG PERFORMANCE STATUS: 0 - Asymptomatic   LABORATORY DATA: Lab Results  Component Value Date   WBC 6.5 12/27/2012   HGB 13.9 12/27/2012   HCT 41.4 12/27/2012   MCV 85.5 12/27/2012   PLT 142* 12/27/2012      Chemistry      Component Value Date/Time   NA 143 12/27/2012 1418   NA 142 12/12/2012 1035   K 3.9 12/27/2012 1418   K 3.8 12/12/2012 1035   CL 100 12/27/2012 1418   CL 99 12/12/2012 1035   CO2 35* 12/27/2012 1418   CO2 30 12/12/2012 1035   BUN 23.0 12/27/2012 1418   BUN 18 12/12/2012 1035   CREATININE 1.0 12/27/2012 1418   CREATININE 0.88 12/12/2012 1035      Component Value Date/Time   CALCIUM 10.3 12/27/2012 1418   CALCIUM 10.1 12/12/2012 1035   ALKPHOS 82 12/27/2012 1418   ALKPHOS 67 04/02/2012 1147   AST 14 12/27/2012 1418   AST 22 04/02/2012 1147    ALT 12 12/27/2012 1418   ALT 19 04/02/2012 1147   BILITOT 0.62 12/27/2012 1418   BILITOT 0.9 04/02/2012 1147    Diagnosis 1. Breast, lumpectomy, Right - SCLEROSED INTRADUCTAL PAPILLOMA SEE COMMENT. - NEGATIVE FOR ATYPIA OR MALIGNANCY. - PREVIOUS BIOPSY SITE CHANGE. - MICROCALCIFICATIONS IDENTIFIED. 2. Breast, lumpectomy, Right - DUCTAL CARCINOMA IN SITU, SEE COMMENT. - IN SITU CARCINOMA IS 1 MM FROM NEAREST SUPERIOR AND INFERIOR MARGIN. - SEE TUMOR TEMPLATE BELOW. 3. Breast, excision, Right - BENIGN BREAST TISSUE, SEE COMMENT. - NEGATIVE FOR ATYPIA OR MALIGNANCY. - MICROCALCIFICATIONS IDENTIFIED. - SURGICAL MARGIN, NEGATIVE FOR ATYPIA OR MALIGNANCY. Microscopic Comment 1. The surgical resection margin(s) of the specimen were inked and microscopically evaluated. 2. BREAST, IN SITU CARCINOMA Specimen, including laterality: Right breast Procedure: Lumpectomy Grade of carcinoma: II of III Necrosis: Present Estimated tumor size: (glass slide measurement): See comment Treatment effect: None If present, treatment effect in breast tissue, lymph nodes or both: N/A Distance to closest margin: 1 mm See above. If margin positive, focally or broadly: N/A Breast prognostic profile: Not repeated Estrogen receptor: Previous study demonstrated 100% positivity ([id number]) Progesterone receptor: Previous study demonstrated 100% positivity ([id number]) Lymph nodes: Examined: 0 Lymph nodes with metastasis: N/A 1 of 3 FINAL for Amy [name], Amy [name] [phone]) Microscopic Comment(continued) TNM: pTis, pNX Comments: Within the 1.7 cm nodular area associated with the localization needle / clip are foci of foci of in situ carcinoma, the largest of which spans 3 cm. Additional non-neoplastic findings include fibrocystic change, usual ductal hyperplasia, fibroadenomatoid nodules and microcalcifications in benign ducts and lobules. Representative sections of the submitted skin demonstrate benign skin with  nonspecific inflammation and fibrosis. There are no atypical or malignant features present. 3. The  surgical resection margin(s) of the specimen were inked and microscopically evaluated. Representative sections demonstrate non-neoplastic findings to include fibrocystic ductal hyperplasia. There are microcalcifications identified in benign ducts and lobules. There are no atypia or malignant epithelial stromal features present. (CRR:caf 12/25/12) [place]   RADIOGRAPHIC STUDIES:  No results found.  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 She does have a close surgical margin. She will meet with Dr. Lurline Hare regarding radiation therapy. Patient is a little concerned about going back to surgery. But she also is concerned about radiation.  #2 we discussed antiestrogen therapy to help prevent future breast cancer risk. She will need either radiation or a reexcision or a combination of both for local recurrence control.  PLAN:  #1 proceed with consultation with Dr. Lurline Hare.  #2 patient will also followup with Dr. Dwain Sarna.   All questions were answered. The patient knows to call the clinic with any problems, questions or concerns. We can certainly see the patient much sooner if necessary.  I spent 25 minutes counseling the patient face to face. The total time spent in the appointment was 30 minutes.    Drue Second, MD Medical/Oncology [facility] [phone] (beeper) [phone] (Office)

## 2013-01-21 ENCOUNTER — Encounter (HOSPITAL_COMMUNITY): Payer: Self-pay | Admitting: *Deleted

## 2013-01-21 ENCOUNTER — Encounter (HOSPITAL_COMMUNITY)
Admission: RE | Disposition: A | Discharge status: Home / Self Care | Payer: Self-pay | Source: Ambulatory Visit | Attending: General Surgery

## 2013-01-21 ENCOUNTER — Ambulatory Visit (HOSPITAL_COMMUNITY): Payer: Medicare Other | Admitting: Anesthesiology

## 2013-01-21 ENCOUNTER — Encounter (HOSPITAL_COMMUNITY): Payer: Self-pay | Admitting: Anesthesiology

## 2013-01-21 ENCOUNTER — Ambulatory Visit (HOSPITAL_COMMUNITY)
Admission: RE | Admit: 2013-01-21 | Discharge: 2013-01-21 | Disposition: A | Discharge status: Home / Self Care | Payer: Medicare Other | Source: Ambulatory Visit | Attending: General Surgery | Admitting: General Surgery

## 2013-01-21 DIAGNOSIS — M129 Arthropathy, unspecified: Secondary | ICD-10-CM | POA: Insufficient documentation

## 2013-01-21 DIAGNOSIS — D0590 Unspecified type of carcinoma in situ of unspecified breast: Secondary | ICD-10-CM | POA: Insufficient documentation

## 2013-01-21 DIAGNOSIS — I129 Hypertensive chronic kidney disease with stage 1 through stage 4 chronic kidney disease, or unspecified chronic kidney disease: Secondary | ICD-10-CM | POA: Insufficient documentation

## 2013-01-21 DIAGNOSIS — G5790 Unspecified mononeuropathy of unspecified lower limb: Secondary | ICD-10-CM | POA: Insufficient documentation

## 2013-01-21 DIAGNOSIS — Z9049 Acquired absence of other specified parts of digestive tract: Secondary | ICD-10-CM | POA: Insufficient documentation

## 2013-01-21 DIAGNOSIS — F17200 Nicotine dependence, unspecified, uncomplicated: Secondary | ICD-10-CM | POA: Insufficient documentation

## 2013-01-21 DIAGNOSIS — Z91040 Latex allergy status: Secondary | ICD-10-CM | POA: Insufficient documentation

## 2013-01-21 DIAGNOSIS — Z905 Acquired absence of kidney: Secondary | ICD-10-CM | POA: Insufficient documentation

## 2013-01-21 DIAGNOSIS — Z85038 Personal history of other malignant neoplasm of large intestine: Secondary | ICD-10-CM | POA: Insufficient documentation

## 2013-01-21 DIAGNOSIS — R920 Mammographic microcalcification found on diagnostic imaging of breast: Secondary | ICD-10-CM

## 2013-01-21 DIAGNOSIS — D649 Anemia, unspecified: Secondary | ICD-10-CM | POA: Insufficient documentation

## 2013-01-21 DIAGNOSIS — Z85118 Personal history of other malignant neoplasm of bronchus and lung: Secondary | ICD-10-CM | POA: Insufficient documentation

## 2013-01-21 DIAGNOSIS — Z9109 Other allergy status, other than to drugs and biological substances: Secondary | ICD-10-CM | POA: Insufficient documentation

## 2013-01-21 DIAGNOSIS — N189 Chronic kidney disease, unspecified: Secondary | ICD-10-CM | POA: Insufficient documentation

## 2013-01-21 DIAGNOSIS — K219 Gastro-esophageal reflux disease without esophagitis: Secondary | ICD-10-CM | POA: Insufficient documentation

## 2013-01-21 DIAGNOSIS — E119 Type 2 diabetes mellitus without complications: Secondary | ICD-10-CM | POA: Insufficient documentation

## 2013-01-21 DIAGNOSIS — F411 Generalized anxiety disorder: Secondary | ICD-10-CM | POA: Insufficient documentation

## 2013-01-21 DIAGNOSIS — R011 Cardiac murmur, unspecified: Secondary | ICD-10-CM | POA: Insufficient documentation

## 2013-01-21 DIAGNOSIS — Z79899 Other long term (current) drug therapy: Secondary | ICD-10-CM | POA: Insufficient documentation

## 2013-01-21 DIAGNOSIS — Z170 Estrogen receptor positive status [ER+]: Secondary | ICD-10-CM | POA: Insufficient documentation

## 2013-01-21 DIAGNOSIS — Z91041 Radiographic dye allergy status: Secondary | ICD-10-CM | POA: Insufficient documentation

## 2013-01-21 DIAGNOSIS — Z85528 Personal history of other malignant neoplasm of kidney: Secondary | ICD-10-CM | POA: Insufficient documentation

## 2013-01-21 DIAGNOSIS — Z808 Family history of malignant neoplasm of other organs or systems: Secondary | ICD-10-CM | POA: Insufficient documentation

## 2013-01-21 HISTORY — PX: RE-EXCISION OF BREAST CANCER,SUPERIOR MARGINS: SHX6047

## 2013-01-21 HISTORY — DX: Shortness of breath: R06.02

## 2013-01-21 LAB — GLUCOSE, CAPILLARY: Glucose-Capillary: 111 mg/dL — ABNORMAL HIGH (ref 70–99)

## 2013-01-21 SURGERY — RE-EXCISION OF BREAST CANCER,SUPERIOR MARGINS
Anesthesia: General | Site: Breast | Laterality: Right | Wound class: Clean

## 2013-01-21 MED ORDER — CEFAZOLIN SODIUM-DEXTROSE 2-3 GM-% IV SOLR
INTRAVENOUS | Status: AC
  Filled 2013-01-21: qty 50

## 2013-01-21 MED ORDER — CEFAZOLIN SODIUM-DEXTROSE 2-3 GM-% IV SOLR
2.0000 g | INTRAVENOUS | Status: AC
  Administered 2013-01-21: 2 g via INTRAVENOUS

## 2013-01-21 MED ORDER — KETOROLAC TROMETHAMINE 30 MG/ML IJ SOLN: 15.0000 mg | Freq: Once | INTRAMUSCULAR | Status: DC | PRN

## 2013-01-21 MED ORDER — PROPOFOL 10 MG/ML IV BOLUS
INTRAVENOUS | Status: DC | PRN
  Administered 2013-01-21: 180 mg via INTRAVENOUS

## 2013-01-21 MED ORDER — MORPHINE SULFATE 10 MG/ML IJ SOLN: 2.0000 mg | INTRAMUSCULAR | Status: DC | PRN

## 2013-01-21 MED ORDER — ACETAMINOPHEN 650 MG RE SUPP
650.0000 mg | RECTAL | Status: DC | PRN
  Filled 2013-01-21: qty 1

## 2013-01-21 MED ORDER — OXYCODONE HCL 5 MG PO TABS
5.0000 mg | ORAL_TABLET | ORAL | Status: DC | PRN
  Administered 2013-01-21: 5 mg via ORAL
  Filled 2013-01-21: qty 1

## 2013-01-21 MED ORDER — SODIUM CHLORIDE 0.9 % IV SOLN: 250.0000 mL | INTRAVENOUS | Status: DC | PRN

## 2013-01-21 MED ORDER — INSULIN ASPART 100 UNIT/ML ~~LOC~~ SOLN: 0.0000 [IU] | Freq: Three times a day (TID) | SUBCUTANEOUS | Status: DC

## 2013-01-21 MED ORDER — SODIUM CHLORIDE 0.9 % IV SOLN: INTRAVENOUS | Status: DC

## 2013-01-21 MED ORDER — ACETAMINOPHEN 325 MG PO TABS: 650.0000 mg | ORAL_TABLET | ORAL | Status: DC | PRN

## 2013-01-21 MED ORDER — BUPIVACAINE HCL (PF) 0.25 % IJ SOLN
INTRAMUSCULAR | Status: AC
  Filled 2013-01-21: qty 30

## 2013-01-21 MED ORDER — ONDANSETRON HCL 4 MG/2ML IJ SOLN: 4.0000 mg | Freq: Four times a day (QID) | INTRAMUSCULAR | Status: DC | PRN

## 2013-01-21 MED ORDER — FENTANYL CITRATE 0.05 MG/ML IJ SOLN
INTRAMUSCULAR | Status: DC | PRN
  Administered 2013-01-21: 100 ug via INTRAVENOUS

## 2013-01-21 MED ORDER — BUPIVACAINE HCL 0.25 % IJ SOLN
INTRAMUSCULAR | Status: DC | PRN
  Administered 2013-01-21 (×2): 10 mL

## 2013-01-21 MED ORDER — SODIUM CHLORIDE 0.9 % IJ SOLN: 3.0000 mL | INTRAMUSCULAR | Status: DC | PRN

## 2013-01-21 MED ORDER — LACTATED RINGERS IV SOLN
INTRAVENOUS | Status: DC
  Administered 2013-01-21: 1000 mL via INTRAVENOUS

## 2013-01-21 MED ORDER — SODIUM CHLORIDE 0.9 % IJ SOLN: 3.0000 mL | Freq: Two times a day (BID) | INTRAMUSCULAR | Status: DC

## 2013-01-21 MED ORDER — FENTANYL CITRATE 0.05 MG/ML IJ SOLN: 25.0000 ug | INTRAMUSCULAR | Status: DC | PRN

## 2013-01-21 MED ORDER — OXYCODONE-ACETAMINOPHEN 5-325 MG PO TABS: 1.0000 | ORAL_TABLET | ORAL | Status: DC | PRN

## 2013-01-21 MED ORDER — PROMETHAZINE HCL 25 MG/ML IJ SOLN: 6.2500 mg | INTRAMUSCULAR | Status: DC | PRN

## 2013-01-21 MED ORDER — MIDAZOLAM HCL 5 MG/5ML IJ SOLN
INTRAMUSCULAR | Status: DC | PRN
  Administered 2013-01-21: 2 mg via INTRAVENOUS

## 2013-01-21 MED ORDER — EPHEDRINE SULFATE 50 MG/ML IJ SOLN
INTRAMUSCULAR | Status: DC | PRN
  Administered 2013-01-21: 5 mg via INTRAVENOUS
  Administered 2013-01-21: 10 mg via INTRAVENOUS

## 2013-01-21 SURGICAL SUPPLY — 45 items
APPLIER CLIP 11 MED OPEN (CLIP) ×2
BLADE HEX COATED 2.75 (ELECTRODE) ×2 IMPLANT
BLADE SURG 15 STRL LF DISP TIS (BLADE) ×3 IMPLANT
BLADE SURG 15 STRL SS (BLADE) ×3
CANISTER SUCTION 2500CC (MISCELLANEOUS) ×2 IMPLANT
CHLORAPREP W/TINT 26ML (MISCELLANEOUS) ×2 IMPLANT
CLIP APPLIE 11 MED OPEN (CLIP) ×1 IMPLANT
CLOTH BEACON ORANGE TIMEOUT ST (SAFETY) ×2 IMPLANT
COVER SURGICAL LIGHT HANDLE (MISCELLANEOUS) ×2 IMPLANT
DECANTER SPIKE VIAL GLASS SM (MISCELLANEOUS) ×2 IMPLANT
DERMABOND ADVANCED (GAUZE/BANDAGES/DRESSINGS) ×1
DERMABOND ADVANCED .7 DNX12 (GAUZE/BANDAGES/DRESSINGS) ×1 IMPLANT
DRAPE LAPAROSCOPIC ABDOMINAL (DRAPES) ×2 IMPLANT
ELECT REM PT RETURN 9FT ADLT (ELECTROSURGICAL) ×2
ELECTRODE REM PT RTRN 9FT ADLT (ELECTROSURGICAL) ×1 IMPLANT
GAUZE SPONGE 4X4 16PLY XRAY LF (GAUZE/BANDAGES/DRESSINGS) ×2 IMPLANT
GLOVE BIO SURGEON STRL SZ7 (GLOVE) ×2 IMPLANT
GLOVE BIOGEL PI IND STRL 7.0 (GLOVE) ×1 IMPLANT
GLOVE BIOGEL PI IND STRL 7.5 (GLOVE) ×1 IMPLANT
GLOVE BIOGEL PI INDICATOR 7.0 (GLOVE) ×1
GLOVE BIOGEL PI INDICATOR 7.5 (GLOVE) ×1
GOWN PREVENTION PLUS LG XLONG (DISPOSABLE) ×2 IMPLANT
GOWN PREVENTION PLUS XLARGE (GOWN DISPOSABLE) ×4 IMPLANT
GOWN STRL NON-REIN LRG LVL3 (GOWN DISPOSABLE) ×2 IMPLANT
GOWN STRL REIN XL XLG (GOWN DISPOSABLE) ×4 IMPLANT
KIT BASIN OR (CUSTOM PROCEDURE TRAY) ×2 IMPLANT
MARKER SKIN DUAL TIP RULER LAB (MISCELLANEOUS) ×2 IMPLANT
NEEDLE HYPO 22GX1.5 SAFETY (NEEDLE) IMPLANT
NEEDLE HYPO 25X1 1.5 SAFETY (NEEDLE) ×2 IMPLANT
PACK BASIC VI WITH GOWN DISP (CUSTOM PROCEDURE TRAY) ×2 IMPLANT
PENCIL BUTTON HOLSTER BLD 10FT (ELECTRODE) ×2 IMPLANT
SPONGE GAUZE 4X4 12PLY (GAUZE/BANDAGES/DRESSINGS) IMPLANT
STRIP CLOSURE SKIN 1/2X4 (GAUZE/BANDAGES/DRESSINGS) ×2 IMPLANT
SUT MNCRL AB 4-0 PS2 18 (SUTURE) ×2 IMPLANT
SUT VIC AB 2-0 CT1 27 (SUTURE) ×2
SUT VIC AB 2-0 CT1 TAPERPNT 27 (SUTURE) ×2 IMPLANT
SUT VIC AB 2-0 SH 27 (SUTURE) ×1
SUT VIC AB 2-0 SH 27X BRD (SUTURE) ×1 IMPLANT
SUT VIC AB 3-0 SH 27 (SUTURE) ×4
SUT VIC AB 3-0 SH 27X BRD (SUTURE) ×3 IMPLANT
SUT VIC AB 3-0 SH 27XBRD (SUTURE) ×1 IMPLANT
SYR BULB IRRIGATION 50ML (SYRINGE) ×2 IMPLANT
SYR CONTROL 10ML LL (SYRINGE) ×2 IMPLANT
TOWEL OR 17X26 10 PK STRL BLUE (TOWEL DISPOSABLE) ×2 IMPLANT
YANKAUER SUCT BULB TIP 10FT TU (MISCELLANEOUS) ×2 IMPLANT

## 2013-01-21 NOTE — Anesthesia Preprocedure Evaluation (Signed)
Anesthesia Evaluation  Patient identified by MRN, date of birth, ID band Patient awake    Reviewed: Allergy & Precautions, H&P , NPO status , Patient's Chart, lab work & pertinent test results  Airway Mallampati: III TM Distance: <3 FB Neck ROM: Full    Dental no notable dental hx.    Pulmonary Current Smoker,  breath sounds clear to auscultation  Pulmonary exam normal       Cardiovascular hypertension, Rhythm:Regular Rate:Normal     Neuro/Psych negative neurological ROS  negative psych ROS   GI/Hepatic negative GI ROS, Neg liver ROS,   Endo/Other  diabetesMorbid obesity  Renal/GU  Chronic kidney disease 1999 RIGHT KIDNEY REMOVED   negative genitourinary   Musculoskeletal negative musculoskeletal ROS (+)   Abdominal   Peds negative pediatric ROS (+)  Hematology negative hematology ROS (+)   Anesthesia Other Findings   Reproductive/Obstetrics negative OB ROS                           Anesthesia Physical Anesthesia Plan  ASA: III  Anesthesia Plan: General   Post-op Pain Management:    Induction: Intravenous  Airway Management Planned: LMA  Additional Equipment:   Intra-op Plan:   Post-operative Plan:   Informed Consent: I have reviewed the patients History and Physical, chart, labs and discussed the procedure including the risks, benefits and alternatives for the proposed anesthesia with the patient or authorized representative who has indicated his/her understanding and acceptance.   Dental advisory given  Plan Discussed with: CRNA and Surgeon  Anesthesia Plan Comments:         Anesthesia Quick Evaluation

## 2013-01-21 NOTE — Transfer of Care (Signed)
Immediate Anesthesia Transfer of Care Note  Patient: Amy [name]  Procedure(s) Performed: Procedure(s): RE-EXCISION OF BREAST CANCER,SUPERIOR MARGINS (Right)  Patient Location: PACU  Anesthesia Type:General  Level of Consciousness: awake, sedated and patient cooperative  Airway & Oxygen Therapy: Patient Spontanous Breathing and Patient connected to face mask oxygen  Post-op Assessment: Report given to PACU RN and Post -op Vital signs reviewed and stable  Post vital signs: Reviewed and stable  Complications: No apparent anesthesia complications

## 2013-01-21 NOTE — Op Note (Signed)
Preoperative diagnosis: Right breast ductal carcinoma in situ with close superior margin status post lumpectomy Postoperative diagnosis: Same as above Procedure: Reexcision of right breast superior margin Surgeon: Dr. Harden Mo Anesthesia: Gen. With LMA Specimens: Right breast superior margin marked with paint chips Estimated blood loss: Minimal Drains: None Complications: None Sponge needle count correct at end of operation Disposition to recovery stable  Indications: This is a 38 short female who I know well from treatment of her colon cancer. She then was diagnosed with right breast ductal carcinoma in situ. I excised this right breast ductal carcinoma in situ as well as a papilloma several weeks ago. She had 2 margins that were close. The inferior margin was reexcised at the time of her initial surgery. The superior margin is 1 mm. She has been seen in the multidisciplinary fashion and we have decided to proceed with re-excision and hopefully can just have antiestrogen therapy in order to avoid radiation due to her comorbidities as well as her prior lung cancer and smoking history. She is agreeable to that.  Procedure: After informed consent was obtained the patient was taken to the operating room. She was given 2 g of intravenous cefazolin. Sequential compression devices were on her legs. She was then placed under general anesthesia with LMA. Her right breast was prepped and draped in the standard sterile surgical fashion. Surgical timeout was performed.  I made an incision and reentered her old wound. I had closed this cavity down completely but was able to identify the cavity. I then reexcised the entire superior margin. This was then marked with the paint kit. This was then passed off the table. Hemostasis was observed. I then closed the breast tissue with 2-0 Vicryl after placing a clip at the new superior margin. I then closed this in 2 layers. I then closed the dermis with 3-0 Vicryl  and the skin with 4-0 Monocryl. I injected a total of 20 cc of Marcaine. I then placed dermabond and Steri-Strips. A breast binder was placed. She tolerated this well was extubated and transferred to recovery stable.

## 2013-01-21 NOTE — Anesthesia Postprocedure Evaluation (Signed)
  Anesthesia Post-op Note  Patient: Amy [name]  Procedure(s) Performed: Procedure(s) (LRB): RE-EXCISION OF BREAST CANCER,SUPERIOR MARGINS (Right)  Patient Location: PACU  Anesthesia Type: General  Level of Consciousness: awake and alert   Airway and Oxygen Therapy: Patient Spontanous Breathing  Post-op Pain: mild  Post-op Assessment: Post-op Vital signs reviewed, Patient's Cardiovascular Status Stable, Respiratory Function Stable, Patent Airway and No signs of Nausea or vomiting  Last Vitals:  Filed Vitals:   01/21/13 1015  BP: 165/89  Pulse: 70  Temp:   Resp: 20    Post-op Vital Signs: stable   Complications: No apparent anesthesia complications

## 2013-01-21 NOTE — H&P (Signed)
Amy [name] is an 67 y.o. female.   Chief Complaint: right breast dcis HPI: 37 yof who I know well from recent partial colectomy. After this she got her screening mammogram which showed right sided calcifications. 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 ER/PR positive at 100%. She then had MR which showed postbiopsy changes as well as 3x1.4x0.8 cm right sided breast mass. This underwent US guided biopsy that showed intraductal papilloma and has been recommended for excision. She underwent excision of papilloma and a lumpectomy with two close margins.  The inferior margin has been excised at time of surgery and this is clear. The superior margin is close and we have discussed multiple options in multidisciplinary fashion  Past Medical History  Diagnosis Date  . Depression   . Diabetes mellitus     diet controlled  . Anemia   . Anxiety   . Arthritis   . Hernia     Near umbilicus  . Hemorrhoids   . Heart murmur   . Cancer     colon,renal,lung  . Lung cancer 2001  . Colon cancer 1999, 2013  . Breast cancer, right breast 10/03/12  . Hypertension     Does not see a cardiologist  . Allergy   . Blood transfusion without reported diagnosis 1999    prbc S/P COLON SURGERY  . Cataract     NO SURGERY  . GERD (gastroesophageal reflux disease)   . Chronic kidney disease 1999    RIGHT KIDNEY REMOVED  . Neuromuscular disorder     PERIPHERAL NEUROPATHY FEET  . Ulcer     HX YEARS AGO  . Shortness of breath     with exertion     Past Surgical History  Procedure Laterality Date  . Colonoscopy    . Polypectomy    . Lung lobectomy      right lower  . Colon surgery      chemo  . Kidney surgery  1999    right/chemo kidney removed for cancer  . Partial hysterectomy    . Breast biopsy      Bilateral cysts/benign  . Colectomy  05-31-2012    laparoscopic ,possible open  . Abdominal hysterectomy  80's    fibroids  . Tonsillectomy      as child  . Partial colectomy   05/31/2012    Procedure: PARTIAL COLECTOMY;  Surgeon: Emelia Loron, MD;  Location: [facility];  Service: General;  Laterality: N/A;  . Breast biopsy Right 10/03/12    ER/PR +  . Breast lumpectomy with needle localization Right 12/20/2012    Procedure: BREAST LUMPECTOMY WITH NEEDLE LOCALIZATION;  Surgeon: Emelia Loron, MD;  Location: [facility] OR;  Service: General;  Laterality: Right;  . Breast biopsy Right 12/20/2012    Procedure: Right breast Excisional Biopsy;  Surgeon: Emelia Loron, MD;  Location: [facility] OR;  Service: General;  Laterality: Right;    Family History  Problem Relation Age of Onset  . Colon polyps Brother   . Cancer Brother 39    MULTIPLE MYELOMA  . Breast cancer Mother   . Heart disease Father   . Colon cancer Neg Hx   . Esophageal cancer Neg Hx   . Rectal cancer Neg Hx   . Stomach cancer Neg Hx    Social History:  reports that she has been smoking Cigarettes.  She started smoking about 20 years ago. She has a 10 pack-year smoking history. She has never used smokeless tobacco. She reports  that she does not drink alcohol or use illicit drugs.  Allergies:  Allergies  Allergen Reactions  . Iodine Anaphylaxis  . Iohexol      Code: HIVES, Desc: pt states , swelling mouth , throat, and has difficulty breathing   . Adhesive (Tape) Rash  . Latex Itching and Rash    Medications Prior to Admission  Medication Sig Dispense Refill  . ALPRAZolam (XANAX) 1 MG tablet Take 0.5 mg by mouth at bedtime.       . Ascorbic Acid (VITAMIN C WITH ROSE HIPS) 500 MG tablet Take 500 mg by mouth every morning.       Marland Kitchen atorvastatin (LIPITOR) 20 MG tablet Take 20 mg by mouth daily before breakfast.       . Calcium Carbonate-Vitamin D (CALCIUM-VITAMIN D) 500-200 MG-UNIT per tablet Take 1 tablet by mouth daily after breakfast.       . ezetimibe (ZETIA) 10 MG tablet Take 10 mg by mouth every morning.       . Ferrous Gluconate (IRON) 240 (27 FE) MG TABS Take 1 tablet by mouth every morning.        . hydrochlorothiazide 25 MG tablet Take 25 mg by mouth daily before breakfast.       . lisinopril (PRINIVIL,ZESTRIL) 10 MG tablet Take 10 mg by mouth daily before breakfast.       . metFORMIN (GLUCOPHAGE) 500 MG tablet Take 500 mg by mouth daily with breakfast.       . metoprolol (LOPRESSOR) 100 MG tablet Take 100 mg by mouth daily before breakfast.       . Multiple Vitamin (MULTIVITAMIN WITH MINERALS) TABS Take 1 tablet by mouth every morning. She takes Surveyor, quantity Adult 50+.      Marland Kitchen omeprazole (PRILOSEC) 20 MG capsule Take 20 mg by mouth every morning.       Marland Kitchen PARoxetine (PAXIL) 40 MG tablet Take 40 mg by mouth daily before breakfast.       . traMADol (ULTRAM) 50 MG tablet Take 50 mg by mouth every 6 (six) hours as needed for pain.         Results for orders placed during the hospital encounter of 01/21/13 (from the past 48 hour(s))  GLUCOSE, CAPILLARY     Status: Abnormal   Collection Time    01/21/13  7:10 AM      Result Value Range   Glucose-Capillary 132 (*) 70 - 99 mg/dL   No results found.  ROS  Blood pressure 153/85, pulse 81, temperature 98.2 F (36.8 C), temperature source Oral, resp. rate 18, SpO2 96.00%. Physical Exam  Constitutional: She appears well-developed and well-nourished.  Cardiovascular: Normal rate, regular rhythm and normal heart sounds.   Respiratory: Effort normal and breath sounds normal.       Assessment/Plan Right breast DCIS  We have decided to proceed with excision of margin. She has been seen by Dr Michell Heinrich and we have both discussed case.    Amy [name] 01/21/2013, 8:44 AM

## 2013-01-22 ENCOUNTER — Encounter (HOSPITAL_COMMUNITY): Payer: Self-pay | Admitting: General Surgery

## 2013-01-24 ENCOUNTER — Telehealth (INDEPENDENT_AMBULATORY_CARE_PROVIDER_SITE_OTHER): Payer: Self-pay

## 2013-01-24 NOTE — Telephone Encounter (Signed)
Pt notified of the path report showing no more cancer all margins benign per Dr Dwain Sarna. The pt advised she does not have to have anymore surgery only treated with medication no radiation required. The pt understands.

## 2013-02-05 ENCOUNTER — Ambulatory Visit (INDEPENDENT_AMBULATORY_CARE_PROVIDER_SITE_OTHER): Payer: Medicare Other | Admitting: General Surgery

## 2013-02-05 ENCOUNTER — Encounter (INDEPENDENT_AMBULATORY_CARE_PROVIDER_SITE_OTHER): Payer: Self-pay | Admitting: General Surgery

## 2013-02-05 VITALS — BP 124/84 | HR 78 | Resp 14 | Ht 66.0 in | Wt 245.2 lb

## 2013-02-05 DIAGNOSIS — Z09 Encounter for follow-up examination after completed treatment for conditions other than malignant neoplasm: Secondary | ICD-10-CM

## 2013-02-05 NOTE — Progress Notes (Signed)
Subjective:     Patient ID: Amy [name], female   DOB: Nov 22, 1945, 67 y.o.   MRN: [id number]  HPI 4 yof well known to me from prior colon cancer and with extensive oncologic history who underwent right breast excision and a right breast wire guided lumpectomy recently. We had long discussion about options and we returned to or for reexcision.  There is no residual tumor. Her margins are all well clear in all directions now.  She is doing well without complaint   Review of Systems     Objective:   Physical Exam Healing right breast incisions without infection    Assessment:     Right breast dcis      Plan:     She will see med onc soon to discuss adjuvant antiestrogen therapy.  She is released to full activity and I will see for follow up in 6 months.

## 2013-02-05 NOTE — Patient Instructions (Signed)
Breast Self-Examination You should begin examining your breasts at age 67 even though the risk for breast cancer is low in this age group. It is important to become familiar with how your breasts look and feel. This is true for pregnant women, nursing mothers, women in menopause and women who have breast implants.  Women should examine their breasts once a month to look for changes and lumps. By doing monthly breast exams, you get to know how your breasts feel and how they can change from month to month. This allows you to pick up changes early. It can also offer you some reassurance that your breast health is good. This exam only takes minutes. Most breast lumps are not caused by cancer. If you find a lump, a special x-ray called a mammogram, or other tests may be needed to determine what is wrong.  Some of the signs that a breast lump is caused by cancer include:  Dimpling of the skin or changes in the shape of the breast or nipple.   A dark-colored or bloody discharge from the nipple.   Swollen lymph glands around the breast or in the armpit.   Redness of the breast or nipple.   Scaly nipple or skin on the breast.   Pain or swelling of the breast.  SELF-EXAM There are a few points to follow when doing a thorough breast exam. The best time to examine your breasts is 5 to 7 days after the menstrual period is over. During menstruation, the breasts are lumpier, and it may be more difficult to pick up changes. If you do not menstruate, have reached menopause or had a hysterectomy, examine your breasts the first day of every month. After three to four months, you will become more familiar with the variations of your breasts and more comfortable with the exam.  Perform your breast exam monthly. Keep a written record with breast changes or normal findings for each breast. This makes it easier to be sure of changes and to not solely depend on memory for size, tenderness, or location. Try to do the exam  at the same time each month, and write down where you are in your menstrual cycle if you are still menstruating.   Look at your breasts. Stand in front of a mirror with your hands clasped behind your head. Tighten your chest muscles and look for asymmetry. This means a difference in shape or contour from one breast to the other, such as puckers, dips or bumps. Look also for skin changes.   Lean forward with your hands on your hips. Again, look for symmetry and skin changes.   While showering, soap the breasts, and carefully feel the breasts with fingertips while holding the arm (on the side of the breast being examined) over the head. Do this with each breast carefully feeling for lumps or changes. Typically, a circular motion with moderate fingertip pressure should be used.   Repeat this exam while lying on your back, again with your arm behind your head and a pillow under your shoulders. Again, use your fingertips to examine both breasts, feeling for lumps and thickening. Begin at 1 o'clock and go clockwise around the whole breast.   At the end of your exam, gently squeeze each nipple to see if there is any drainage. Look for nipple changes, dimpling or redness.   Lastly, examine the upper chest and clavicle areas and in your armpits.  It is not necessary to become alarmed if you find   a breast lump. Most of them are not cancerous. However, it is necessary to see your caregiver if a lump is found in order to have it looked at. Document Released: 08/18/2004 Document Revised: 03/23/2011 Document Reviewed: 10/28/2008 Exit[employer] Patient Information 2012 [employer]. 

## 2013-02-20 ENCOUNTER — Encounter: Payer: Self-pay | Admitting: Gastroenterology

## 2013-02-27 ENCOUNTER — Encounter: Payer: Self-pay | Admitting: Gastroenterology

## 2013-03-05 ENCOUNTER — Telehealth: Payer: Self-pay | Admitting: Oncology

## 2013-03-05 NOTE — Telephone Encounter (Signed)
, °

## 2013-03-27 ENCOUNTER — Ambulatory Visit: Payer: Medicare Other | Admitting: Oncology

## 2013-04-02 ENCOUNTER — Ambulatory Visit (HOSPITAL_BASED_OUTPATIENT_CLINIC_OR_DEPARTMENT_OTHER): Payer: Medicare Other | Admitting: Oncology

## 2013-04-02 ENCOUNTER — Encounter: Payer: Self-pay | Admitting: Oncology

## 2013-04-02 ENCOUNTER — Telehealth: Payer: Self-pay | Admitting: *Deleted

## 2013-04-02 VITALS — BP 118/72 | HR 82 | Temp 98.4°F | Resp 18 | Ht 66.0 in | Wt 245.7 lb

## 2013-04-02 DIAGNOSIS — C50919 Malignant neoplasm of unspecified site of unspecified female breast: Secondary | ICD-10-CM

## 2013-04-02 DIAGNOSIS — C50911 Malignant neoplasm of unspecified site of right female breast: Secondary | ICD-10-CM

## 2013-04-02 DIAGNOSIS — M8580 Other specified disorders of bone density and structure, unspecified site: Secondary | ICD-10-CM

## 2013-04-02 DIAGNOSIS — E559 Vitamin D deficiency, unspecified: Secondary | ICD-10-CM

## 2013-04-02 DIAGNOSIS — D0511 Intraductal carcinoma in situ of right breast: Secondary | ICD-10-CM

## 2013-04-02 DIAGNOSIS — D0590 Unspecified type of carcinoma in situ of unspecified breast: Secondary | ICD-10-CM

## 2013-04-02 DIAGNOSIS — M899 Disorder of bone, unspecified: Secondary | ICD-10-CM

## 2013-04-02 MED ORDER — EXEMESTANE 25 MG PO TABS: 25.0000 mg | ORAL_TABLET | Freq: Every day | ORAL | Status: DC

## 2013-04-02 NOTE — Telephone Encounter (Signed)
appts made and printed...td 

## 2013-04-02 NOTE — Patient Instructions (Addendum)
Proceed with aromasin to help prevent breast cancer  We discussed side effects of aromasin and further information is as below  We ordered bone density scan to evaluat for osteopenia  I will see you back in 3 months for follow up  Exemestane tablets What is this medicine? EXEMESTANE (ex e MES tane) blocks the production of the hormone estrogen. Some types of breast cancer depend on estrogen to grow, and this medicine can stop tumor growth by blocking estrogen production. This medicine is for the treatment of breast cancer in postmenopausal women only. This medicine may be used for other purposes; ask your health care provider or pharmacist if you have questions. What should I tell my health care provider before I take this medicine? They need to know if you have any of these conditions: -an unusual or allergic reaction to exemestane, other medicines, foods, dyes, or preservatives -pregnant or trying to get pregnant -breast-feeding How should I use this medicine? Take this medicine by mouth with a glass of water. Follow the directions on the prescription label. Take your doses at regular intervals after a meal. Do not take your medicine more often than directed. Do not stop taking except on the advice of your doctor or health care professional. Contact your pediatrician regarding the use of this medicine in children. Special care may be needed. Overdosage: If you think you have taken too much of this medicine contact a poison control center or emergency room at once. NOTE: This medicine is only for you. Do not share this medicine with others. What if I miss a dose? If you miss a dose, take the next dose as usual. Do not try to make up the missed dose. Do not take double or extra doses. What may interact with this medicine? Do not take this medicine with any of the following medications: -female hormones, like estrogens and birth control pills This medicine may also interact with the  following medications: -androstenedione -phenytoin -rifabutin, rifampin, or rifapentine -St. John's Wort This list may not describe all possible interactions. Give your health care provider a list of all the medicines, herbs, non-prescription drugs, or dietary supplements you use. Also tell them if you smoke, drink alcohol, or use illegal drugs. Some items may interact with your medicine. What should I watch for while using this medicine? Visit your doctor or health care professional for regular checks on your progress. If you experience hot flashes or sweating while taking this medicine, avoid alcohol, smoking and drinks with caffeine. This may help to decrease these side effects. What side effects may I notice from receiving this medicine? Side effects that you should report to your doctor or health care professional as soon as possible: -any new or unusual symptoms -changes in vision -fever -leg or arm swelling -pain in bones, joints, or muscles -pain in hips, back, ribs, arms, shoulders, or legs Side effects that usually do not require medical attention (report to your doctor or health care professional if they continue or are bothersome): -difficulty sleeping -headache -hot flashes -sweating -unusually weak or tired This list may not describe all possible side effects. Call your doctor for medical advice about side effects. You may report side effects to FDA at 1-800-FDA-1088. Where should I keep my medicine? Keep out of the reach of children. Store at room temperature between 15 and 30 degrees C (59 and 86 degrees F). Throw away any unused medicine after the expiration date. NOTE: This sheet is a summary. It may not cover  all possible information. If you have questions about this medicine, talk to your doctor, pharmacist, or health care provider.  2013, Elsevier/Gold Standard. (11/13/2007 11:48:29 AM)

## 2013-04-02 NOTE — Progress Notes (Signed)
OFFICE PROGRESS NOTE  CC  POLITE,[name], MD [address] [place] [address] Dr. Emelia Loron Dr. 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 female whose [name] history began in 1999 when she was diagnosed with Amy Amy colon [name]. She underwent Amy Amy followed by 6 months of chemotherapy under the care of Amy Amy  #2 She had Amy Amy the right kidney in about that same time. In November 2001 she had Amy Amy removed from the right lung with Amy Amy procedure by Amy Amy. She has been monitored with serial colonoscopies and has had several adenomas removed. She was seen at the [facility] genetics clinic but was not tested that I can see. She says she is in some sort of study looking at her and as well as the rest of her family due to the high rate of cancers there.   #3 Amy Amy in September of last year showed an ulcerative [name] in the transverse colon. [name] revealed polyps that were negative for dysplasia. Surgical excision was recommended. She underwent this with Amy Amy in November with pathology revealing Amy Amy invading the submucosa without muscularis propria involvement. Margins were negative. 0/9 lymph nodes were positive. No adjuvant chemotherapy was recommended. She then underwent her screening mammogram in March of this year  #4 She then underwent her screening mammogram in March of this year. New calcifications were noted. She underwent Amy Amy on March 12 which showed DCIS. 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 This was ER positive at 100% PR positive at 100%. Followup MRI revealed with artifact as well as Amy Amy in the upper and her region of the right breast. This had features suspicious for Amy Amy. Ultrasound is  recommended. Ultrasound-guided [name] of this area revealed an intraductal papilloma  #5 She met with Amy Amy and discussed surgical options. They discussed mastectomy versus lumpectomy. She did have difficulties with wound healing after her Amy and actually still has Amy Amy in place for this. She has been on disability since 2000 due to her multiple medical problems as well as her multiple cancers. She still has some pain from her [name]  #6 patient is now status post lumpectomy with sentinel lymph node [name] of the right breast performed on 12/20/2012. The final pathology revealed ductal Amy in situ nodes were negative on all pathologic staging is Tis [name], stage 0. Patient's case was discussed at the multidisciplinary breast clinic she is Amy Amy for radiation therapy followed by adjuvant antiestrogen therapy. There was Amy Amy. I discussed this with the patient. She at this time is willing to do whatever we recommend and I do think that she should undergo reexcision of the surgical [name].  CURRENT THERAPY: Aromasin 25 mg daily  INTERVAL HISTORY: Amy Amy 67 y.o. female returns for followup after her surgery. Overall she's doing well. She does have Amy close surgical [name]. I do think she should have this reexcised. We also discussed role of antiestrogen therapy and radiation.   MEDICAL HISTORY: Past Medical History  Diagnosis Date  . Depression   . Diabetes mellitus     diet controlled  . Anemia   . Anxiety   . Arthritis   . Hernia  Near umbilicus  . Hemorrhoids   . Heart murmur   . [name]     colon,renal,lung  . Lung [name] 2001  . Colon [name] 1999, 2013  . Breast [name], right breast 10/03/12  . Hypertension     Does not see Amy cardiologist  . Allergy   . Blood transfusion without reported diagnosis 1999    prbc S/P COLON SURGERY  . Cataract     NO SURGERY  . GERD (gastroesophageal reflux disease)   . Chronic kidney disease  1999    RIGHT KIDNEY REMOVED  . Neuromuscular disorder     PERIPHERAL NEUROPATHY FEET  . Ulcer     HX YEARS AGO  . Shortness of breath     with exertion     ALLERGIES:  is allergic to iodine; iohexol; adhesive; and latex.  MEDICATIONS:  Current Outpatient Prescriptions  Medication Sig Dispense Refill  . ALPRAZolam (XANAX) 1 MG tablet Take 0.5 mg by mouth at bedtime.       . Ascorbic Acid (VITAMIN C WITH ROSE HIPS) 500 MG tablet Take 500 mg by mouth every morning.       Marland Kitchen atorvastatin (LIPITOR) 20 MG tablet Take 20 mg by mouth daily before breakfast.       . Calcium Carbonate-Vitamin D (CALCIUM-VITAMIN D) 500-200 MG-UNIT per tablet Take 1 tablet by mouth daily after breakfast.       . Ferrous Gluconate (IRON) 240 (27 FE) MG TABS Take 1 tablet by mouth every morning.       . hydrochlorothiazide 25 MG tablet Take 25 mg by mouth daily before breakfast.       . losartan (COZAAR) 50 MG tablet Take 50 mg by mouth daily.      . metFORMIN (GLUCOPHAGE) 500 MG tablet Take 500 mg by mouth daily with breakfast.       . metoprolol (LOPRESSOR) 100 MG tablet Take 100 mg by mouth daily before breakfast.       . Multiple Vitamin (MULTIVITAMIN WITH MINERALS) TABS Take 1 tablet by mouth every morning. She takes Surveyor, quantity Adult 50+.      Marland Kitchen omeprazole (PRILOSEC) 20 MG capsule Take 20 mg by mouth every morning.       Marland Kitchen PARoxetine (PAXIL) 40 MG tablet Take 40 mg by mouth daily before breakfast.       . traMADol (ULTRAM) 50 MG tablet Take 50 mg by mouth every 6 (six) hours as needed for pain.       Marland Kitchen exemestane (AROMASIN) 25 MG tablet Take 1 tablet (25 mg total) by mouth daily after breakfast.  90 tablet  6   No current facility-administered medications for this visit.    SURGICAL HISTORY:  Past Surgical History  Procedure Laterality Date  . Amy    . Polypectomy    . Lung lobectomy      right lower  . Colon surgery      chemo  . Kidney surgery  1999    right/chemo kidney removed for  [name]  . Partial hysterectomy    . Breast [name]      Bilateral cysts/benign  . Amy  05-31-2012    laparoscopic ,possible open  . Abdominal hysterectomy  80's    fibroids  . Tonsillectomy      as child  . Partial Amy  05/31/2012    Procedure: PARTIAL Amy;  Surgeon: Emelia Loron, MD;  Location: [facility] ORS;  Service: General;  Laterality: N/Amy;  . Breast [name] Right 10/03/12  ER/PR +  . Breast lumpectomy with needle localization Right 12/20/2012    Procedure: BREAST LUMPECTOMY WITH NEEDLE LOCALIZATION;  Surgeon: Emelia Loron, MD;  Location: [facility] OR;  Service: General;  Laterality: Right;  . Breast [name] Right 12/20/2012    Procedure: Right breast Excisional [name];  Surgeon: Emelia Loron, MD;  Location: [facility] OR;  Service: General;  Laterality: Right;  . Re-excision of breast [name],superior margins Right 01/21/2013    Procedure: RE-EXCISION OF BREAST [name],SUPERIOR MARGINS;  Surgeon: Emelia Loron, MD;  Location: [facility] OR[facility];  Service: General;  Laterality: Right;    REVIEW OF SYSTEMS:  Amy comprehensive review of systems was negative.   HEALTH MAINTENANCE:  PHYSICAL EXAMINATION: Blood pressure 118/72, pulse 82, temperature 98.4 F (36.9 C), temperature source Oral, resp. rate 18, height 5\' 6"  (1.676 m), weight 245 lb 11.2 oz (111.449 kg). Body [name] index is 39.68 kg/(m^2).  ECOG PERFORMANCE STATUS: 0 - Asymptomatic   LABORATORY DATA: Lab Results  Component Value Date   WBC 6.5 12/27/2012   HGB 13.9 12/27/2012   HCT 41.4 12/27/2012   MCV 85.5 12/27/2012   PLT 142* 12/27/2012      Chemistry      Component Value Date/Time   NA 143 12/27/2012 1418   NA 142 12/12/2012 1035   K 3.9 12/27/2012 1418   K 3.8 12/12/2012 1035   CL 100 12/27/2012 1418   CL 99 12/12/2012 1035   CO2 35* 12/27/2012 1418   CO2 30 12/12/2012 1035   BUN 23.0 12/27/2012 1418   BUN 18 12/12/2012 1035   CREATININE 1.0 12/27/2012 1418   CREATININE 0.88 12/12/2012 1035      Component Value Date/Time    CALCIUM 10.3 12/27/2012 1418   CALCIUM 10.1 12/12/2012 1035   ALKPHOS 82 12/27/2012 1418   ALKPHOS 67 04/02/2012 1147   AST 14 12/27/2012 1418   AST 22 04/02/2012 1147   ALT 12 12/27/2012 1418   ALT 19 04/02/2012 1147   BILITOT 0.62 12/27/2012 1418   BILITOT 0.9 04/02/2012 1147    Diagnosis 1. Breast, lumpectomy, Right - SCLEROSED INTRADUCTAL PAPILLOMA SEE COMMENT. - NEGATIVE FOR ATYPIA OR MALIGNANCY. - PREVIOUS [name] SITE CHANGE. - MICROCALCIFICATIONS IDENTIFIED. 2. Breast, lumpectomy, Right - DUCTAL Amy IN SITU, SEE COMMENT. - IN SITU Amy IS 1 MM FROM NEAREST SUPERIOR AND INFERIOR [name]. - SEE TUMOR TEMPLATE BELOW. 3. Breast, excision, Right - BENIGN BREAST TISSUE, SEE COMMENT. - NEGATIVE FOR ATYPIA OR MALIGNANCY. - MICROCALCIFICATIONS IDENTIFIED. - SURGICAL [name], NEGATIVE FOR ATYPIA OR MALIGNANCY. Microscopic Comment 1. The surgical resection [name](s) of the specimen were inked and microscopically evaluated. 2. BREAST, IN SITU Amy Specimen, including laterality: Right breast Procedure: Lumpectomy Grade of Amy: II of III Necrosis: Present Estimated tumor size: (glass slide measurement): See comment Treatment effect: None If present, treatment effect in breast tissue, lymph nodes or both: N/Amy Distance to closest [name]: 1 mm See above. If [name] positive, focally or broadly: N/Amy Breast prognostic profile: Not repeated Estrogen receptor: Previous study demonstrated 100% positivity ([id number]) Progesterone receptor: Previous study demonstrated 100% positivity ([id number]) Lymph nodes: Examined: 0 Lymph nodes with metastasis: N/Amy 1 of 3 FINAL for Amy Amy, Amy Amy [phone]) Microscopic Comment(continued) TNM: pTis, pNX Comments: Within the 1.7 cm nodular area associated with the localization needle / clip are foci of foci of in situ Amy, the largest of which spans 3 cm. Additional non-neoplastic findings include fibrocystic change, usual ductal  hyperplasia, fibroadenomatoid nodules and microcalcifications in benign ducts and lobules. Representative sections  of the submitted skin demonstrate benign skin with nonspecific inflammation and fibrosis. There are no atypical or malignant features present. 3. The surgical resection [name](s) of the specimen were inked and microscopically evaluated. Representative sections demonstrate non-neoplastic findings to include fibrocystic ductal hyperplasia. There are microcalcifications identified in benign ducts and lobules. There are no atypia or malignant epithelial stromal features present. (CRR:caf 12/25/12) [place]   RADIOGRAPHIC STUDIES:  No results found.  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 She does have Amy close surgical [name]. She will meet with Dr. Lurline Hare regarding radiation therapy. Patient is Amy little concerned about going back to surgery. But she also is concerned about radiation.  #2 we discussed antiestrogen therapy to help prevent future breast [name] risk. She will need either radiation or Amy reexcision or Amy combination of both for local recurrence control.  PLAN:  #1 patient will proceed with Aromasin 25 mg daily.  #2 we discussed side effects of the Aromasin in detail.  #3 she will have Amy bone density scan performed.  #4 I will see her back in 3 months time for followup  All questions were answered. The patient knows to call the clinic with any problems, questions or concerns. We can certainly see the patient much sooner if necessary.  I spent 25 minutes counseling the patient face to face. The total time spent in the appointment was 30 minutes.    Drue Second, MD Medical/Oncology [facility] [phone] (beeper) [phone] (Office)

## 2013-04-24 ENCOUNTER — Ambulatory Visit
Admission: RE | Admit: 2013-04-24 | Discharge: 2013-04-24 | Disposition: A | Discharge status: Home / Self Care | Payer: Medicare Other | Source: Ambulatory Visit | Attending: Oncology | Admitting: Oncology

## 2013-04-24 DIAGNOSIS — M8580 Other specified disorders of bone density and structure, unspecified site: Secondary | ICD-10-CM

## 2013-05-14 ENCOUNTER — Telehealth: Payer: Self-pay | Admitting: *Deleted

## 2013-05-14 NOTE — Telephone Encounter (Signed)
Called pt with Bone Densiity reuslts. - pt diagnostic category normal- pt verbalized understanding. States she is having some hot flashes, they are not unbearable yet. Pt will call back if they become worse. No further concerns at this time.

## 2013-05-23 ENCOUNTER — Encounter: Payer: Medicare Other | Admitting: Gastroenterology

## 2013-06-13 ENCOUNTER — Telehealth: Payer: Self-pay | Admitting: Oncology

## 2013-07-10 ENCOUNTER — Encounter (INDEPENDENT_AMBULATORY_CARE_PROVIDER_SITE_OTHER): Payer: Self-pay | Admitting: General Surgery

## 2013-07-12 ENCOUNTER — Ambulatory Visit (AMBULATORY_SURGERY_CENTER): Payer: Self-pay

## 2013-07-12 VITALS — Ht 65.0 in | Wt 247.0 lb

## 2013-07-12 DIAGNOSIS — Z85038 Personal history of other malignant neoplasm of large intestine: Secondary | ICD-10-CM

## 2013-07-12 MED ORDER — MOVIPREP 100 G PO SOLR: 1.0000 | Freq: Once | ORAL | Status: DC

## 2013-07-15 ENCOUNTER — Other Ambulatory Visit: Payer: Medicare Other | Admitting: Lab

## 2013-07-15 ENCOUNTER — Encounter: Payer: Self-pay | Admitting: Gastroenterology

## 2013-07-15 ENCOUNTER — Ambulatory Visit: Payer: Medicare Other | Admitting: Oncology

## 2013-07-22 ENCOUNTER — Telehealth: Payer: Self-pay | Admitting: Gastroenterology

## 2013-07-22 NOTE — Telephone Encounter (Signed)
Free Mov[employer] voucher mailed to pt and pt made aware

## 2013-07-29 ENCOUNTER — Other Ambulatory Visit (HOSPITAL_BASED_OUTPATIENT_CLINIC_OR_DEPARTMENT_OTHER): Payer: Medicare Other

## 2013-07-29 ENCOUNTER — Encounter: Payer: Self-pay | Admitting: Adult Health

## 2013-07-29 ENCOUNTER — Telehealth: Payer: Self-pay | Admitting: *Deleted

## 2013-07-29 ENCOUNTER — Ambulatory Visit (HOSPITAL_BASED_OUTPATIENT_CLINIC_OR_DEPARTMENT_OTHER): Payer: Medicare Other | Admitting: Adult Health

## 2013-07-29 VITALS — BP 150/91 | HR 73 | Temp 98.7°F | Resp 18 | Ht 65.0 in | Wt 250.1 lb

## 2013-07-29 DIAGNOSIS — E559 Vitamin D deficiency, unspecified: Secondary | ICD-10-CM

## 2013-07-29 DIAGNOSIS — D0511 Intraductal carcinoma in situ of right breast: Secondary | ICD-10-CM

## 2013-07-29 DIAGNOSIS — C50911 Malignant neoplasm of unspecified site of right female breast: Secondary | ICD-10-CM

## 2013-07-29 DIAGNOSIS — Z170 Estrogen receptor positive status [ER+]: Secondary | ICD-10-CM

## 2013-07-29 DIAGNOSIS — D0590 Unspecified type of carcinoma in situ of unspecified breast: Secondary | ICD-10-CM

## 2013-07-29 LAB — CBC WITH DIFFERENTIAL/PLATELET
BASO%: 0.6 % (ref 0.0–2.0)
Basophils Absolute: 0 10*3/uL (ref 0.0–0.1)
EOS%: 1.6 % (ref 0.0–7.0)
Eosinophils Absolute: 0.1 10*3/uL (ref 0.0–0.5)
HEMATOCRIT: 44.4 % (ref 34.8–46.6)
HGB: 14.6 g/dL (ref 11.6–15.9)
LYMPH#: 2.2 10*3/uL (ref 0.9–3.3)
LYMPH%: 30.5 % (ref 14.0–49.7)
MCH: 28.8 pg (ref 25.1–34.0)
MCHC: 32.9 g/dL (ref 31.5–36.0)
MCV: 87.6 fL (ref 79.5–101.0)
MONO#: 0.4 10*3/uL (ref 0.1–0.9)
MONO%: 6 % (ref 0.0–14.0)
NEUT#: 4.5 10*3/uL (ref 1.5–6.5)
NEUT%: 61.3 % (ref 38.4–76.8)
PLATELETS: 145 10*3/uL (ref 145–400)
RBC: 5.07 10*6/uL (ref 3.70–5.45)
RDW: 15.1 % — ABNORMAL HIGH (ref 11.2–14.5)
WBC: 7.3 10*3/uL (ref 3.9–10.3)

## 2013-07-29 LAB — COMPREHENSIVE METABOLIC PANEL (CC13)
ALT: 16 U/L (ref 0–55)
AST: 16 U/L (ref 5–34)
Albumin: 3.9 g/dL (ref 3.5–5.0)
Alkaline Phosphatase: 79 U/L (ref 40–150)
Anion Gap: 12 mEq/L — ABNORMAL HIGH (ref 3–11)
BILIRUBIN TOTAL: 0.81 mg/dL (ref 0.20–1.20)
BUN: 18.7 mg/dL (ref 7.0–26.0)
CO2: 34 mEq/L — ABNORMAL HIGH (ref 22–29)
CREATININE: 1 mg/dL (ref 0.6–1.1)
Calcium: 10.2 mg/dL (ref 8.4–10.4)
Chloride: 97 mEq/L — ABNORMAL LOW (ref 98–109)
Glucose: 131 mg/dl (ref 70–140)
Potassium: 3.5 mEq/L (ref 3.5–5.1)
SODIUM: 142 meq/L (ref 136–145)
Total Protein: 7.4 g/dL (ref 6.4–8.3)

## 2013-07-29 NOTE — Patient Instructions (Signed)
Doing well.  Continue Aromasin.  See you back in 6 months.  Please call us if you have any questions or concerns.

## 2013-07-29 NOTE — Progress Notes (Signed)
OFFICE PROGRESS NOTE  CC  Amy D, MD [address] [place] [address] Dr. Rolm [name] Dr. 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 female whose cancer history began in 1999 when she was diagnosed with a IIIB colon cancer. She underwent a colectomy followed by 6 months of chemotherapy under the care of Dr. Sonny [name]  #2 She had a T2 N0 renal cell carcinoma the right kidney in about that same time. In November 2001 she had a T1 N0 non-small cell lung cancer removed from the right lung with a VATS procedure by Dr. Arlyce [name]. She has been monitored with serial colonoscopies and has had several adenomas removed. She was seen at the [facility] but was not tested that I can see. She says she is in some sort of study looking at her and as well as the rest of her family due to the high rate of cancers there.   #3 A colonoscopy in September of last year showed an ulcerative mass in the transverse colon. Biopsy revealed polyps that were negative for dysplasia. Surgical excision was recommended. 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 Margins were negative. 0/9 lymph nodes were positive. No adjuvant chemotherapy was recommended. She then underwent her screening mammogram in March of this year  #4 She then underwent her screening mammogram in March of this year. New calcifications were noted. 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 This was ER positive at 100% PR positive at 100%. Followup MRI revealed with artifact as well as a 3.2 x 1.4 x 0.8 cm mass in the upper and her region of the right breast. This had features suspicious for a papillary neoplasm. Ultrasound is  recommended. Ultrasound-guided biopsy of this area revealed an intraductal papilloma  #5 She met with Dr. Donne [name] and discussed surgical options. They discussed mastectomy versus lumpectomy. She did have difficulties with wound healing after her colectomy and actually still has a bandage in place for this. She has been on disability since 2000 due to her multiple medical problems as well as her multiple cancers. She still has some pain from her biopsy  #6 patient is now status post lumpectomy with sentinel lymph node biopsy of the right breast performed on 12/20/2012. The final pathology revealed ductal carcinoma in situ nodes were negative on all pathologic staging is Tis N0, stage 0. Patient's case was discussed at the multidisciplinary breast clinic she is a good candidate for radiation therapy followed by adjuvant antiestrogen therapy. There was a questionable margin. This was discussed with the patient.  She underwent re-excision on 01/21/13.  Pathology revealed no residual carcinoma or atypia.  She was evaluated by Dr. Pablo [name] who determined that no radiation therapy was needed due to her decision for re-excision and an aromatase inhibitor.   CURRENT THERAPY: Aromasin 25 mg daily  INTERVAL HISTORY: Amy [name] 68 y.o. female returns for followup and evaluation of her DCIS since starting an Aromatase inhibitor 3 months ago.  She has increased hot flashes since starting the Aromasin and mild joint aches, but otherwise is tolerating it well.  She was doing water aerobics, but hasn't in a few months.  Otherwise, she denies fevers, chills, night sweats, unintentional weight loss, pain,  or any other concerns.    MEDICAL HISTORY: Past Medical History  Diagnosis Date  . Depression   . Diabetes mellitus     diet controlled  . Anemia   . Anxiety   . Arthritis   . Hernia     Near umbilicus  . Hemorrhoids   . Heart murmur   . Cancer     colon,renal,lung  . Lung cancer 2001  . Colon cancer  1999, 2013  . Breast cancer, right breast 10/03/12  . Hypertension     Does not see a cardiologist  . Allergy   . Blood transfusion without reported diagnosis 1999    prbc S/P COLON SURGERY  . Cataract     NO SURGERY  . GERD (gastroesophageal reflux disease)   . Chronic kidney disease 1999    RIGHT KIDNEY REMOVED  . Neuromuscular disorder     PERIPHERAL NEUROPATHY FEET  . Ulcer     HX YEARS AGO  . Shortness of breath     with exertion   . Hot flashes     5 yr pill for breast CA causing hot flashes    ALLERGIES:  is allergic to iodine; iohexol; adhesive; and latex.  MEDICATIONS:  Current Outpatient Prescriptions  Medication Sig Dispense Refill  . ALPRAZolam (XANAX) 1 MG tablet Take 0.5 mg by mouth at bedtime.       . Ascorbic Acid (VITAMIN C WITH ROSE HIPS) 500 MG tablet Take 500 mg by mouth every morning.       Marland Kitchen atorvastatin (LIPITOR) 20 MG tablet Take 20 mg by mouth daily before breakfast.       . Calcium Carbonate-Vitamin [name] (CALCIUM-VITAMIN [name]) 500-200 MG-UNIT per tablet Take 1 tablet by mouth daily after breakfast.       . exemestane (AROMASIN) 25 MG tablet Take 1 tablet (25 mg total) by mouth daily after breakfast.  90 tablet  6  . Ferrous Gluconate (IRON) 240 (27 FE) MG TABS Take 1 tablet by mouth every morning.       . hydrochlorothiazide 25 MG tablet Take 25 mg by mouth daily before breakfast.       . losartan (COZAAR) 50 MG tablet Take 50 mg by mouth daily.      . metFORMIN (GLUCOPHAGE) 500 MG tablet Take 500 mg by mouth daily with breakfast.       . metoprolol (LOPRESSOR) 100 MG tablet Take 100 mg by mouth daily before breakfast.       . Multiple Vitamin (MULTIVITAMIN WITH MINERALS) TABS Take 1 tablet by mouth every morning. She takes Dance movement psychotherapist Adult 50+.      Marland Kitchen omeprazole (PRILOSEC) 20 MG capsule Take 20 mg by mouth every morning.       Marland Kitchen PARoxetine (PAXIL) 40 MG tablet Take 40 mg by mouth daily before breakfast.       . traMADol (ULTRAM) 50 MG tablet Take 50 mg  by mouth every 6 (six) hours as needed for pain.       Marland Kitchen MOVIPREP 100 G SOLR Take 1 kit (200 g total) by mouth once.  1 kit  0   No current facility-administered medications for this visit.    SURGICAL HISTORY:  Past Surgical History  Procedure Laterality Date  . Colonoscopy    . Polypectomy    . Lung lobectomy      right lower  . Colon surgery      chemo  . Kidney surgery  1999    right/chemo  kidney removed for cancer  . Partial hysterectomy    . Breast biopsy      Bilateral cysts/benign  . Colectomy  05-31-2012    laparoscopic ,possible open  . Abdominal hysterectomy  80's    fibroids  . Tonsillectomy      as child  . Partial colectomy  05/31/2012    Procedure: PARTIAL COLECTOMY;  Surgeon: Amy Bookbinder, MD;  Location: [facility];  Service: General;  Laterality: N/A;  . Breast biopsy Right 10/03/12    ER/PR +  . Breast lumpectomy with needle localization Right 12/20/2012    Procedure: BREAST LUMPECTOMY WITH NEEDLE LOCALIZATION;  Surgeon: Amy Bookbinder, MD;  Location: [facility];  Service: General;  Laterality: Right;  . Breast biopsy Right 12/20/2012    Procedure: Right breast Excisional Biopsy;  Surgeon: Amy Bookbinder, MD;  Location: [facility];  Service: General;  Laterality: Right;  . Re-excision of breast cancer,superior margins Right 01/21/2013    Procedure: RE-EXCISION OF BREAST CANCER,SUPERIOR MARGINS;  Surgeon: Amy Bookbinder, MD;  Location: [facility];  Service: General;  Laterality: Right;    REVIEW OF SYSTEMS:  A 10 point review of systems was conducted and is otherwise negative except for what is noted above.    Health Maintenance  Mammogram: 10/17/12 Colonoscopy: 03/2012 Bone Density Scan: 04/24/13, normal Pap Smear: partial hysterectomy in 1980s Eye Exam: annually Vitamin [name] Level: never  Lipid Panel: 06/2013   PHYSICAL EXAMINATION: Blood pressure 150/91, pulse 73, temperature 98.7 F (37.1 C), temperature source Oral, resp. rate 18, height _0  (1.651 m),  weight 250 lb 1.6 oz (113.445 kg). Body mass index is 41.62 kg/(m^2). GENERAL: Patient is a well appearing female in no acute distress HEENT:  Sclerae anicteric.  Oropharynx clear and moist. No ulcerations or evidence of oropharyngeal candidiasis. Neck is supple.  NODES:  No cervical, supraclavicular, or axillary lymphadenopathy palpated.  BREAST EXAM:  Deferred. LUNGS:  Clear to auscultation bilaterally.  No wheezes or rhonchi. HEART:  Regular rate and rhythm. No murmur appreciated. ABDOMEN:  Soft, nontender.  Positive, normoactive bowel sounds. No organomegaly palpated. MSK:  No focal spinal tenderness to palpation. Full range of motion bilaterally in the upper extremities. EXTREMITIES:  No peripheral edema.   SKIN:  Clear with no obvious rashes or skin changes. No nail dyscrasia. NEURO:  Nonfocal. Well oriented.  Appropriate affect. ECOG PERFORMANCE STATUS: 0 - Asymptomatic   LABORATORY DATA: Lab Results  Component Value Date   WBC 7.3 07/29/2013   HGB 14.6 07/29/2013   HCT 44.4 07/29/2013   MCV 87.6 07/29/2013   PLT 145 07/29/2013      Chemistry      Component Value Date/Time   NA 142 07/29/2013 1251   NA 142 12/12/2012 1035   K 3.5 07/29/2013 1251   K 3.8 12/12/2012 1035   CL 100 12/27/2012 1418   CL 99 12/12/2012 1035   CO2 34* 07/29/2013 1251   CO2 30 12/12/2012 1035   BUN 18.7 07/29/2013 1251   BUN 18 12/12/2012 1035   CREATININE 1.0 07/29/2013 1251   CREATININE 0.88 12/12/2012 1035      Component Value Date/Time   CALCIUM 10.2 07/29/2013 1251   CALCIUM 10.1 12/12/2012 1035   ALKPHOS 79 07/29/2013 1251   ALKPHOS 67 04/02/2012 1147   AST 16 07/29/2013 1251   AST 22 04/02/2012 1147   ALT 16 07/29/2013 1251   ALT 19 04/02/2012 1147   BILITOT 0.81 07/29/2013 1251   BILITOT 0.9 04/02/2012 1147  Diagnosis 1. Breast, lumpectomy, Right - SCLEROSED INTRADUCTAL PAPILLOMA SEE COMMENT. - NEGATIVE FOR ATYPIA OR MALIGNANCY. - PREVIOUS BIOPSY SITE CHANGE. - MICROCALCIFICATIONS IDENTIFIED. 2. Breast,  lumpectomy, Right - DUCTAL CARCINOMA IN SITU, SEE COMMENT. - IN SITU CARCINOMA IS 1 MM FROM NEAREST SUPERIOR AND INFERIOR MARGIN. - SEE TUMOR TEMPLATE BELOW. 3. Breast, excision, Right - BENIGN BREAST TISSUE, SEE COMMENT. - NEGATIVE FOR ATYPIA OR MALIGNANCY. - MICROCALCIFICATIONS IDENTIFIED. - SURGICAL MARGIN, NEGATIVE FOR ATYPIA OR MALIGNANCY. Microscopic Comment 1. The surgical resection margin(s) of the specimen were inked and microscopically evaluated. 2. BREAST, IN SITU CARCINOMA Specimen, including laterality: Right breast Procedure: Lumpectomy Grade of carcinoma: II of III Necrosis: Present Estimated tumor size: (glass slide measurement): See comment Treatment effect: None If present, treatment effect in breast tissue, lymph nodes or both: N/A Distance to closest margin: 1 mm See above. If margin positive, focally or broadly: N/A Breast prognostic profile: Not repeated Estrogen receptor: Previous study demonstrated 100% positivity ([id number]) Progesterone receptor: Previous study demonstrated 100% positivity ([id number]) Lymph nodes: Examined: 0 Lymph nodes with metastasis: N/A 1 of 3 FINAL for Amy, [name] A [phone]) Microscopic Comment(continued) TNM: pTis, pNX Comments: Within the 1.7 cm nodular area associated with the localization needle / clip are foci of foci of in situ carcinoma, the largest of which spans 3 cm. Additional non-neoplastic findings include fibrocystic change, usual ductal hyperplasia, fibroadenomatoid nodules and microcalcifications in benign ducts and lobules. Representative sections of the submitted skin demonstrate benign skin with nonspecific inflammation and fibrosis. There are no atypical or malignant features present. 3. The surgical resection margin(s) of the specimen were inked and microscopically evaluated. Representative sections demonstrate non-neoplastic findings to include fibrocystic ductal hyperplasia. There  are microcalcifications identified in benign ducts and lobules. There are no atypia or malignant epithelial stromal features present. (CRR:caf 12/25/12) [place]   RADIOGRAPHIC STUDIES:  No results found.  ASSESSMENT: 68 year old female with  #1 new diagnosis of right DCIS that is ER positive PR positive.She is now status post lumpectomy. She does have a close surgical margin. She underwent re-excision on 01/21/13. Pathology revealed no residual carcinoma or atypia. She was evaluated by Dr. Pablo [name] who determined that no radiation therapy was needed due to her decision for re-excision and an aromatase inhibitor.   #2  The patient was started on adjuvant daily Aromasin, 45m on 04/02/2013.  She is tolerating this moderately well.     PLAN:  #1  Patient is doing well today.  Her CBC is stable.  I reviewed it with her in detail.  A CMP and Vitamin [name] Level is pending.  She is tolerating the Aromasin moderately well and will continue this.    #2 She and I reviewed her health maintenance above.  She is up to date.  She and I discussed diet, exercise, and survivorship.   #3 She will return in 6 months for labs and evaluation.    All questions were answered. The patient knows to call the clinic with any problems, questions or concerns. We can certainly see the patient much sooner if necessary.  I spent 25 minutes counseling the patient face to face. The total time spent in the appointment was 30 minutes.  LMinette Headland [phone]

## 2013-07-29 NOTE — Telephone Encounter (Signed)
appts made and printed...td 

## 2013-07-30 LAB — VITAMIN D 25 HYDROXY (VIT D DEFICIENCY, FRACTURES): Vit D, 25-Hydroxy: 58 ng/mL (ref 30–89)

## 2013-08-02 ENCOUNTER — Ambulatory Visit (AMBULATORY_SURGERY_CENTER): Payer: Medicare Other | Admitting: Gastroenterology

## 2013-08-02 ENCOUNTER — Encounter: Payer: Self-pay | Admitting: Gastroenterology

## 2013-08-02 VITALS — BP 178/98 | HR 62 | Temp 97.4°F | Resp 40 | Ht 65.0 in | Wt 247.0 lb

## 2013-08-02 DIAGNOSIS — D126 Benign neoplasm of colon, unspecified: Secondary | ICD-10-CM

## 2013-08-02 DIAGNOSIS — Z85038 Personal history of other malignant neoplasm of large intestine: Secondary | ICD-10-CM

## 2013-08-02 LAB — GLUCOSE, CAPILLARY: Glucose-Capillary: 131 mg/dL — ABNORMAL HIGH (ref 70–99)

## 2013-08-02 MED ORDER — SODIUM CHLORIDE 0.9 % IV SOLN: 500.0000 mL | INTRAVENOUS | Status: DC

## 2013-08-02 NOTE — Op Note (Signed)
[place]  [address] [place], [address]   COLONOSCOPY PROCEDURE REPORT PATIENT: Amy, [name]  MR#: [id number] BIRTHDATE: 17-Mar-1946 , 15  yrs. old GENDER: Female ENDOSCOPIST: Ladene Artist, MD, [facility] PROCEDURE DATE:  08/02/2013 PROCEDURE:   Colonoscopy with biopsy and snare polypectomy First Screening Colonoscopy - Avg.  risk and is 50 yrs.  old or older - No.  Prior Negative Screening - Now for repeat screening. N/A  History of Adenoma - Now for follow-up colonoscopy & has been > or = to 3 yrs.  N/A  Polyps Removed Today? Yes. ASA CLASS:   Class II INDICATIONS:High risk patient with personal history of colon cancer and T3, N1 in 1999 and T2, N0 in 2013. MEDICATIONS: MAC sedation, administered by CRNA and propofol (Diprivan) 150mg  IV DESCRIPTION OF PROCEDURE:   After the risks benefits and alternatives of the procedure were thoroughly explained, informed consent was obtained.  A digital rectal exam revealed no abnormalities of the rectum.   The [id number]  endoscope was introduced through the anus and advanced to the terminal ileum which was intubated for a short distance. No adverse events experienced.   The quality of the prep was good, using MoviPrep The instrument was then slowly withdrawn as the colon was fully examined.  COLON FINDINGS: There was evidence of a prior ileocolonic surgical anastomosis in the transverse colon.   A sessile polyp measuring 8 mm in size was found in the transverse colon.  A polypectomy was performed using snare cautery.  The resection was complete and the polyp tissue was completely retrieved.   A sessile polyp measuring 3 mm in size was found in the sigmoid colon.  A polypectomy was performed with cold forceps.  The resection was complete and the polyp tissue was completely retrieved.   The colon was otherwise normal.  There was no diverticulosis, inflammation, polyps or cancers unless previously stated.   Retroflexed views revealed small internal hemorrhoids. The time to cecum=1 minutes 32 seconds. Withdrawal time=8 minutes 58 seconds.  The scope was withdrawn and the procedure completed. COMPLICATIONS: There were no complications. ENDOSCOPIC IMPRESSION: 1.   Prior ileocolonic surgical anastomosis in the transverse colon 2.   Sessile polyp measuring 8 mm in the transverse colon; polypectomy performed using snare cautery 3.   Sessile polyp measuring 3 mm in the sigmoid colon; polypectomy performed with cold forceps 4.   Small internal hemorrhoids  RECOMMENDATIONS: 1.  Await pathology results 2.  Hold aspirin, aspirin products, and anti-inflammatory medication for 2 weeks. 3.  Repeat Colonoscopy in 1 year.  eSigned:  Ladene Artist, MD, [facility] 08/02/2013 10:15 AM   cc: Seward Carol MD

## 2013-08-02 NOTE — Progress Notes (Signed)
Called to room to assist during endoscopic procedure.  Patient ID and intended procedure confirmed with present staff. Received instructions for my participation in the procedure from the performing physician.  

## 2013-08-02 NOTE — Progress Notes (Signed)
Procedure ends, to recovery, report given and VSS. 

## 2013-08-02 NOTE — Patient Instructions (Signed)
YOU HAD AN ENDOSCOPIC PROCEDURE TODAY AT THE [facility]: Refer to the procedure report that was given to you for any specific questions about what was found during the examination.  If the procedure report does not answer your questions, please call your gastroenterologist to clarify.  If you requested that your care partner not be given the details of your procedure findings, then the procedure report has been included in a sealed envelope for you to review at your convenience later.  YOU SHOULD EXPECT: Some feelings of bloating in the abdomen. Passage of more gas than usual.  Walking can help get rid of the air that was put into your GI tract during the procedure and reduce the bloating. If you had a lower endoscopy (such as a colonoscopy or flexible sigmoidoscopy) you may notice spotting of blood in your stool or on the toilet paper. If you underwent a bowel prep for your procedure, then you may not have a normal bowel movement for a few days.  DIET: Your first meal following the procedure should be a light meal and then it is ok to progress to your normal diet.  A half-sandwich or bowl of soup is an example of a good first meal.  Heavy or fried foods are harder to digest and may make you feel nauseous or bloated.  Likewise meals heavy in dairy and vegetables can cause extra gas to form and this can also increase the bloating.  Drink plenty of fluids but you should avoid alcoholic beverages for 24 hours.  ACTIVITY: Your care partner should take you home directly after the procedure.  You should plan to take it easy, moving slowly for the rest of the day.  You can resume normal activity the day after the procedure however you should NOT DRIVE or use heavy machinery for 24 hours (because of the sedation medicines used during the test).    SYMPTOMS TO REPORT IMMEDIATELY: A gastroenterologist can be reached at any hour.  During normal business hours, 8:30 AM to 5:00 PM Monday through Friday,  call [phone].  After hours and on weekends, please call the GI answering service at [phone] who will take a message and have the physician on call contact you.   Following lower endoscopy (colonoscopy or flexible sigmoidoscopy):  Excessive amounts of blood in the stool  Significant tenderness or worsening of abdominal pains  Swelling of the abdomen that is new, acute  Fever of 100F or higher   FOLLOW UP: If any biopsies were taken you will be contacted by phone or by letter within the next 1-3 weeks.  Call your gastroenterologist if you have not heard about the biopsies in 3 weeks.  Our staff will call the home number listed on your records the next business day following your procedure to check on you and address any questions or concerns that you may have at that time regarding the information given to you following your procedure. This is a courtesy call and so if there is no answer at the home number and we have not heard from you through the emergency physician on call, we will assume that you have returned to your regular daily activities without incident.  SIGNATURES/CONFIDENTIALITY: You and/or your care partner have signed paperwork which will be entered into your electronic medical record.  These signatures attest to the fact that that the information above on your After Visit Summary has been reviewed and is understood.  Full responsibility of the confidentiality of   this discharge information lies with you and/or your care-partner.   Information on polyps & hemorrhoids given to you today  Hold aspirin & anti inflammatory products for 2 weeks  Repeat colonoscopy in one year

## 2013-08-05 ENCOUNTER — Telehealth: Payer: Self-pay | Admitting: *Deleted

## 2013-08-05 NOTE — Telephone Encounter (Signed)
Message left

## 2013-08-08 ENCOUNTER — Encounter: Payer: Self-pay | Admitting: Gastroenterology

## 2013-08-13 ENCOUNTER — Encounter (INDEPENDENT_AMBULATORY_CARE_PROVIDER_SITE_OTHER): Payer: Self-pay | Admitting: General Surgery

## 2013-08-13 ENCOUNTER — Ambulatory Visit (INDEPENDENT_AMBULATORY_CARE_PROVIDER_SITE_OTHER): Payer: Medicare Other | Admitting: General Surgery

## 2013-08-13 VITALS — BP 132/80 | HR 84 | Resp 18 | Ht 65.0 in | Wt 251.0 lb

## 2013-08-13 DIAGNOSIS — C50919 Malignant neoplasm of unspecified site of unspecified female breast: Secondary | ICD-10-CM

## 2013-08-13 DIAGNOSIS — C50911 Malignant neoplasm of unspecified site of right female breast: Secondary | ICD-10-CM

## 2013-08-13 NOTE — Progress Notes (Signed)
Subjective:     Patient ID: Amy [name], female   DOB: 09/16/1945, 68 y.o.   MRN: [id number]  HPI This is a 68 year old female who I know well from both of colectomy for colon cancer as well as a right breast lumpectomy for ductal carcinoma in situ. She has been maintained on aromasin since then. She has some hot flashes associated with this. She has gained some weight since our last visit but she is otherwise doing well. She has no complaints referable to either breast. She has also undergone a colonoscopy recently with removal of a couple of polyps which were both benign.  Review of Systems     Objective:   Physical Exam  Vitals reviewed. Constitutional: She appears well-developed and well-nourished.  Neck: Neck supple.  Pulmonary/Chest: Right breast exhibits no inverted nipple, no mass, no nipple discharge, no skin change and no tenderness. Left breast exhibits no inverted nipple, no mass, no nipple discharge, no skin change and no tenderness.    Abdominal: Normal appearance and bowel sounds are normal. She exhibits no distension and no mass. There is no tenderness. A hernia (reducible nontender) is present. Hernia confirmed positive in the ventral area.    Lymphadenopathy:    She has no cervical adenopathy.    She has no axillary adenopathy.       Right: No supraclavicular adenopathy present.       Left: No supraclavicular adenopathy present.       Assessment:     History of colon cancer History breast cancer    Plan:     She has no evidence of any recurrence of either cancer right now. She is up-to-date on her colonoscopy. She will get her mammogram in February and will call to schedule this. She's going to continue her own self exams and will also be seen at the [facility] in 6 months. I will plan on seeing her back in one year and less she needs something sooner.

## 2013-08-14 ENCOUNTER — Other Ambulatory Visit: Payer: Self-pay

## 2013-08-14 ENCOUNTER — Other Ambulatory Visit (INDEPENDENT_AMBULATORY_CARE_PROVIDER_SITE_OTHER): Payer: Self-pay | Admitting: General Surgery

## 2013-08-14 DIAGNOSIS — Z9889 Other specified postprocedural states: Secondary | ICD-10-CM

## 2013-08-14 DIAGNOSIS — Z853 Personal history of malignant neoplasm of breast: Secondary | ICD-10-CM

## 2013-09-16 ENCOUNTER — Telehealth: Payer: Self-pay

## 2013-09-16 NOTE — Telephone Encounter (Signed)
Pt called stating co-pay for Exemestane was $45 and she cannot afford.  Asking if there is a program that can help her.  Spoke with [employer] financial dept.  Raquel to follow up with patient.

## 2013-10-01 ENCOUNTER — Encounter: Payer: Self-pay | Admitting: Oncology

## 2013-10-01 ENCOUNTER — Ambulatory Visit
Admission: RE | Admit: 2013-10-01 | Discharge: 2013-10-01 | Disposition: A | Discharge status: Home / Self Care | Payer: Self-pay | Source: Ambulatory Visit | Attending: General Surgery | Admitting: General Surgery

## 2013-10-01 DIAGNOSIS — Z9889 Other specified postprocedural states: Secondary | ICD-10-CM

## 2013-10-01 DIAGNOSIS — Z853 Personal history of malignant neoplasm of breast: Secondary | ICD-10-CM

## 2013-10-01 NOTE — Progress Notes (Signed)
Patient left a message and I called her back at [phone] and could not leave a message.

## 2013-10-03 ENCOUNTER — Encounter: Payer: Self-pay | Admitting: Oncology

## 2013-10-03 NOTE — Progress Notes (Signed)
I called the patient back. She said she has ran out of her Aromasin. She send her income verification and grant form back to me. I have not received it yet and will call her once I get it back.

## 2013-10-04 ENCOUNTER — Encounter: Payer: Self-pay | Admitting: Oncology

## 2013-10-04 NOTE — Progress Notes (Signed)
Received bank statement and signed grant from back from the patient. She does qualify for 600.00 and 400.00 grant. I will let her know.

## 2013-11-06 ENCOUNTER — Ambulatory Visit (INDEPENDENT_AMBULATORY_CARE_PROVIDER_SITE_OTHER): Payer: Medicare Other | Admitting: Obstetrics and Gynecology

## 2013-11-06 ENCOUNTER — Ambulatory Visit: Payer: Medicare Other | Admitting: Obstetrics and Gynecology

## 2013-11-06 ENCOUNTER — Encounter: Payer: Self-pay | Admitting: Obstetrics and Gynecology

## 2013-11-06 VITALS — BP 134/80 | HR 76 | Ht 65.5 in | Wt 253.6 lb

## 2013-11-06 DIAGNOSIS — F17200 Nicotine dependence, unspecified, uncomplicated: Secondary | ICD-10-CM

## 2013-11-06 DIAGNOSIS — Z01419 Encounter for gynecological examination (general) (routine) without abnormal findings: Secondary | ICD-10-CM

## 2013-11-06 NOTE — Progress Notes (Signed)
GYNECOLOGY VISIT  PCP:   Seward Carol, MD  Referring provider:   HPI: 68 y.o.   Widowed  African American  female   No obstetric history on file. with No LMP recorded. Patient has had a hysterectomy.   here for   AEX Status post partial colectomy in 2013 for colon cancer.  Status post right lumpectomy in 2014 for right breast cancer.   Doing Aromasin for 5 years.   Having hot flashes.   History of colon cancer, renal cancer, breast cancer, and lung cancer .  Patient has had recurrent hernia of incision.  Some shortness of breath if has compression of the epigastic area.  Wheezing. Still smoking. Has quit in the past with a cessation program.   Stress in the patient's life occurs, and she then restarts smoking.  Used Wellbutrin in the past but it made patient jittery.   Patient has multiple positive items on review of systems - heat/cold intolerance, new onset headaches, constipation, consistently depressed mood, difficulty with memory, and skin itching.   Will see PCP on April 27th.   Hgb:   PCP Urine:  PCP  GYNECOLOGIC HISTORY: No LMP recorded. Patient has had a hysterectomy. -  Fibroids. Ovaries remain.  Sexually active:  no Partner preference: female Contraception:   Hysterectomy Menopausal hormone therapy: none DES exposure:   no Blood transfusions:  1999  Sexually transmitted diseases:  none  GYN procedures and prior surgeries:  Polypectomy, Hysterectomy, breast lumpectomy Last mammogram:  10-01-13 normal             Last pap and high risk HPV testing:   06-14-02 neg History of abnormal pap smear:  none   OB History   Grav Para Term Preterm Abortions TAB SAB Ect Mult Living                   LIFESTYLE: Exercise:   none          Tobacco: 1 pack every 2-3 days Alcohol: none Drug use:  none  OTHER HEALTH MAINTENANCE: Tetanus/TDap:  Pt. Thinks current with PCP Gardisil:             n/a Influenza:            With PCP Zostavax:             With PCP  Bone  density:       04-24-13 wnl:[facility] Colonoscopy:         08-02-13 multiple polyps with Dr. Fuller Plan.  Hx of colon cancer and recommended repeat colonoscopy in 07/2014.  Cholesterol check:  Takes Lipitor to control  Family History  Problem Relation Age of Onset  . Colon polyps Brother   . Cancer Brother 28    MULTIPLE MYELOMA  . Breast cancer Mother   . Heart disease Father   . Colon cancer Neg Hx   . Esophageal cancer Neg Hx   . Rectal cancer Neg Hx   . Stomach cancer Neg Hx     Patient Active Problem List   Diagnosis Date Noted  . 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 Breast cancer, right breast   . Colon cancer   . Incisional hernia 06/02/2012  . Lung cancer 04/20/2012  . Renal cell cancer 04/20/2012  . COLONIC POLYPS, ADENOMATOUS 12/04/2007   Past Medical History  Diagnosis Date  . Depression   . Diabetes mellitus     diet controlled  . Anemia   . Anxiety   .  Arthritis   . Hernia     Near umbilicus  . Hemorrhoids   . Heart murmur   . Cancer     colon,renal,lung  . Lung cancer 2001  . Colon cancer 1999, 2013  . Breast cancer, right breast 10/03/12  . Hypertension     Does not see a cardiologist  . Allergy   . Blood transfusion without reported diagnosis 1999    prbc S/P COLON SURGERY  . Cataract     NO SURGERY  . GERD (gastroesophageal reflux disease)   . Chronic kidney disease 1999    RIGHT KIDNEY REMOVED  . Neuromuscular disorder     PERIPHERAL NEUROPATHY FEET  . Ulcer     HX YEARS AGO  . Shortness of breath     with exertion   . Hot flashes     5 yr pill for breast CA causing hot flashes    Past Surgical History  Procedure Laterality Date  . Colonoscopy    . Polypectomy    . Lung lobectomy      right lower  . Colon surgery      chemo  . Kidney surgery  1999    right/chemo kidney removed for cancer  . Partial hysterectomy    . Breast biopsy      Bilateral cysts/benign  . Colectomy  05-31-2012    laparoscopic ,possible  open  . Abdominal hysterectomy  80's    fibroids  . Tonsillectomy      as child  . Partial colectomy  05/31/2012    Procedure: PARTIAL COLECTOMY;  Surgeon: Rolm Bookbinder, MD;  Location: [facility];  Service: General;  Laterality: N/A;  . Breast biopsy Right 10/03/12    ER/PR +  . Breast lumpectomy with needle localization Right 12/20/2012    Procedure: BREAST LUMPECTOMY WITH NEEDLE LOCALIZATION;  Surgeon: Rolm Bookbinder, MD;  Location: [facility];  Service: General;  Laterality: Right;  . Breast biopsy Right 12/20/2012    Procedure: Right breast Excisional Biopsy;  Surgeon: Rolm Bookbinder, MD;  Location: [facility];  Service: General;  Laterality: Right;  . Re-excision of breast cancer,superior margins Right 01/21/2013    Procedure: RE-EXCISION OF BREAST CANCER,SUPERIOR MARGINS;  Surgeon: Rolm Bookbinder, MD;  Location: [facility];  Service: General;  Laterality: Right;    ALLERGIES: Iodine; Iohexol; Adhesive; and Latex  Current Outpatient Prescriptions  Medication Sig Dispense Refill  . ALPRAZolam (XANAX) 1 MG tablet Take 0.5 mg by mouth at bedtime.       . Ascorbic Acid (VITAMIN C WITH ROSE HIPS) 500 MG tablet Take 500 mg by mouth every morning.       Marland Kitchen atorvastatin (LIPITOR) 20 MG tablet Take 20 mg by mouth daily before breakfast.       . Calcium Carbonate-Vitamin D (CALCIUM-VITAMIN D) 500-200 MG-UNIT per tablet Take 1 tablet by mouth daily after breakfast.       . exemestane (AROMASIN) 25 MG tablet Take 1 tablet (25 mg total) by mouth daily after breakfast.  90 tablet  6  . Ferrous Gluconate (IRON) 240 (27 FE) MG TABS Take 1 tablet by mouth every morning.       . hydrochlorothiazide 25 MG tablet Take 25 mg by mouth daily before breakfast.       . losartan (COZAAR) 50 MG tablet Take 50 mg by mouth daily.      . metFORMIN (GLUCOPHAGE) 500 MG tablet Take 500 mg by mouth daily with breakfast.       .  metoprolol (LOPRESSOR) 100 MG tablet Take 100 mg by mouth daily before breakfast.       .  Multiple Vitamin (MULTIVITAMIN WITH MINERALS) TABS Take 1 tablet by mouth every morning. She takes Dance movement psychotherapist Adult 50+.      Marland Kitchen omeprazole (PRILOSEC) 20 MG capsule Take 20 mg by mouth every morning.       Marland Kitchen PARoxetine (PAXIL) 40 MG tablet Take 40 mg by mouth daily before breakfast.       . traMADol (ULTRAM) 50 MG tablet Take 50 mg by mouth every 6 (six) hours as needed for pain.        No current facility-administered medications for this visit.     ROS:  Pertinent items are noted in HPI.  SOCIAL HISTORY:  Retired.  Likes to play computer card games.  PHYSICAL EXAMINATION:    There were no vitals taken for this visit.   Wt Readings from Last 3 Encounters:  08/13/13 251 lb (113.853 kg)  08/02/13 247 lb (112.038 kg)  07/29/13 250 lb 1.6 oz (113.445 kg)     Ht Readings from Last 3 Encounters:  08/13/13 5' 5"  (1.651 m)  08/02/13 5' 5"  (1.651 m)  07/29/13 5' 5"  (1.651 m)    General appearance: alert, cooperative and appears stated age.  NAD.  Head: Normocephalic, without obvious abnormality, atraumatic Neck: no adenopathy, supple, symmetrical, trachea midline and thyroid not enlarged, symmetric, no tenderness/mass/nodules Lungs: Right flank incision, clear to auscultation bilaterally Breasts: Right breast scars, left breast inspection negative,  No nipple retraction or dimpling, No nipple discharge or bleeding, No axillary or supraclavicular adenopathy, Normal to palpation without dominant masses Heart: regular rate and rhythm Abdomen: vertical midline incision with small 1 cm hernia in in the midupper portion, nontender. Soft, non-tender; no masses,  no organomegaly Extremities: extremities normal, atraumatic, no cyanosis or edema Skin: Skin color, texture, turgor normal. No rashes or lesions Lymph nodes: Cervical, supraclavicular, and axillary nodes normal. No abnormal inguinal nodes palpated Neurologic: Grossly normal  Pelvic: External genitalia:  no lesions               Urethra:  normal appearing urethra with no masses, tenderness or lesions              Bartholins and Skenes: normal                 Vagina: normal appearing vagina with atrophic changes.               Cervix: absent.              Pap and high risk HPV testing done: no.            Bimanual Exam:  Uterus: absent                                      Adnexa: normal adnexa in size, nontender and no masses                                      Rectovaginal: Confirms                                      Anus:  normal sphincter tone, no lesions  ASSESSMENT  Normal gynecologic exam. Status post TAH for fibroids.  Ovaries retained.  Multiple malignancies of right breast, colon, right kidney, and right lung. Tobacco abuse. Multiple positive review of systems.   PLAN  Mammogram recommended - follow by oncology.  Pap smear and high risk HPV testing not indicated.  Counseled on self breast exam, exercise, and tobacco cessation.  Patient will see her PCP in 2 weeks and will discuss her medical concerns with him at that time.  Return annually or prn   An After Visit Summary was printed and given to the patient.

## 2013-11-06 NOTE — Patient Instructions (Signed)

## 2014-01-27 ENCOUNTER — Telehealth: Payer: Self-pay | Admitting: Adult Health

## 2014-01-27 ENCOUNTER — Other Ambulatory Visit: Payer: Medicare Other

## 2014-01-27 ENCOUNTER — Ambulatory Visit: Payer: Medicare Other | Admitting: Adult Health

## 2014-01-27 NOTE — Telephone Encounter (Signed)
, °

## 2014-02-14 ENCOUNTER — Ambulatory Visit (HOSPITAL_BASED_OUTPATIENT_CLINIC_OR_DEPARTMENT_OTHER): Payer: Medicare Other | Admitting: Adult Health

## 2014-02-14 ENCOUNTER — Telehealth: Payer: Self-pay | Admitting: Adult Health

## 2014-02-14 ENCOUNTER — Encounter: Payer: Self-pay | Admitting: Adult Health

## 2014-02-14 ENCOUNTER — Other Ambulatory Visit (HOSPITAL_BASED_OUTPATIENT_CLINIC_OR_DEPARTMENT_OTHER): Payer: Medicare Other

## 2014-02-14 VITALS — BP 138/82 | HR 68 | Temp 98.4°F | Resp 18 | Ht 65.5 in | Wt 259.0 lb

## 2014-02-14 DIAGNOSIS — D0590 Unspecified type of carcinoma in situ of unspecified breast: Secondary | ICD-10-CM

## 2014-02-14 DIAGNOSIS — C50911 Malignant neoplasm of unspecified site of right female breast: Secondary | ICD-10-CM

## 2014-02-14 DIAGNOSIS — Z170 Estrogen receptor positive status [ER+]: Secondary | ICD-10-CM

## 2014-02-14 DIAGNOSIS — D0511 Intraductal carcinoma in situ of right breast: Secondary | ICD-10-CM

## 2014-02-14 LAB — CBC WITH DIFFERENTIAL/PLATELET
BASO%: 0.7 % (ref 0.0–2.0)
BASOS ABS: 0 10*3/uL (ref 0.0–0.1)
EOS%: 1.8 % (ref 0.0–7.0)
Eosinophils Absolute: 0.1 10*3/uL (ref 0.0–0.5)
HCT: 41.8 % (ref 34.8–46.6)
HEMOGLOBIN: 13.5 g/dL (ref 11.6–15.9)
LYMPH%: 29.8 % (ref 14.0–49.7)
MCH: 28.4 pg (ref 25.1–34.0)
MCHC: 32.3 g/dL (ref 31.5–36.0)
MCV: 87.9 fL (ref 79.5–101.0)
MONO#: 0.3 10*3/uL (ref 0.1–0.9)
MONO%: 5.6 % (ref 0.0–14.0)
NEUT%: 62.1 % (ref 38.4–76.8)
NEUTROS ABS: 3.6 10*3/uL (ref 1.5–6.5)
PLATELETS: 145 10*3/uL (ref 145–400)
RBC: 4.76 10*6/uL (ref 3.70–5.45)
RDW: 14.9 % — AB (ref 11.2–14.5)
WBC: 5.7 10*3/uL (ref 3.9–10.3)
lymph#: 1.7 10*3/uL (ref 0.9–3.3)

## 2014-02-14 LAB — COMPREHENSIVE METABOLIC PANEL (CC13)
ALBUMIN: 3.7 g/dL (ref 3.5–5.0)
ALK PHOS: 90 U/L (ref 40–150)
ALT: 12 U/L (ref 0–55)
ANION GAP: 10 meq/L (ref 3–11)
AST: 16 U/L (ref 5–34)
BUN: 18.7 mg/dL (ref 7.0–26.0)
CO2: 33 mEq/L — ABNORMAL HIGH (ref 22–29)
Calcium: 10 mg/dL (ref 8.4–10.4)
Chloride: 100 mEq/L (ref 98–109)
Creatinine: 0.9 mg/dL (ref 0.6–1.1)
GLUCOSE: 157 mg/dL — AB (ref 70–140)
POTASSIUM: 3.5 meq/L (ref 3.5–5.1)
Sodium: 143 mEq/L (ref 136–145)
Total Bilirubin: 0.79 mg/dL (ref 0.20–1.20)
Total Protein: 7.1 g/dL (ref 6.4–8.3)

## 2014-02-14 NOTE — Patient Instructions (Signed)
You are doing well.  You have no sign of recurrence.  Continue taking Aromasin daily.  I recommended healthy diet, exercise, and monthly breast exams.  We will see you back in 6 months.  Please call us if you have any questions or concerns.    Breast Self-Awareness Practicing breast self-awareness may pick up problems early, prevent significant medical complications, and possibly save your life. By practicing breast self-awareness, you can become familiar with how your breasts look and feel and if your breasts are changing. This allows you to notice changes early. It can also offer you some reassurance that your breast health is good. One way to learn what is normal for your breasts and whether your breasts are changing is to do a breast self-exam. If you find a lump or something that was not present in the past, it is best to contact your caregiver right away. Other findings that should be evaluated by your caregiver include nipple discharge, especially if it is bloody; skin changes or reddening; areas where the skin seems to be pulled in (retracted); or new lumps and bumps. Breast pain is seldom associated with cancer (malignancy), but should also be evaluated by a caregiver. HOW TO PERFORM A BREAST SELF-EXAM The best time to examine your breasts is 5-7 days after your menstrual period is over. During menstruation, the breasts are lumpier, and it may be more difficult to pick up changes. If you do not menstruate, have reached menopause, or had your uterus removed (hysterectomy), you should examine your breasts at regular intervals, such as monthly. If you are breastfeeding, examine your breasts after a feeding or after using a breast pump. Breast implants do not decrease the risk for lumps or tumors, so continue to perform breast self-exams as recommended. Talk to your caregiver about how to determine the difference between the implant and breast tissue. Also, talk about the amount of pressure you should use  during the exam. Over time, you will become more familiar with the variations of your breasts and more comfortable with the exam. A breast self-exam requires you to remove all your clothes above the waist. 1. Look at your breasts and nipples. Stand in front of a mirror in a room with good lighting. With your hands on your hips, push your hands firmly downward. Look for a difference in shape, contour, and size from one breast to the other (asymmetry). Asymmetry includes puckers, dips, or bumps. Also, look for skin changes, such as reddened or scaly areas on the breasts. Look for nipple changes, such as discharge, dimpling, repositioning, or redness. 2. Carefully feel your breasts. This is best done either in the shower or tub while using soapy water or when flat on your back. Place the arm (on the side of the breast you are examining) above your head. Use the pads (not the fingertips) of your three middle fingers on your opposite hand to feel your breasts. Start in the underarm area and use  inch (2 cm) overlapping circles to feel your breast. Use 3 different levels of pressure (light, medium, and firm pressure) at each circle before moving to the next circle. The light pressure is needed to feel the tissue closest to the skin. The medium pressure will help to feel breast tissue a little deeper, while the firm pressure is needed to feel the tissue close to the ribs. Continue the overlapping circles, moving downward over the breast until you feel your ribs below your breast. Then, move one finger-width towards  the center of the body. Continue to use the  inch (2 cm) overlapping circles to feel your breast as you move slowly up toward the collar bone (clavicle) near the base of the neck. Continue the up and down exam using all 3 pressures until you reach the middle of the chest. Do this with each breast, carefully feeling for lumps or changes. 3.  Keep a written record with breast changes or normal findings for  each breast. By writing this information down, you do not need to depend only on memory for size, tenderness, or location. Write down where you are in your menstrual cycle, if you are still menstruating. Breast tissue can have some lumps or thick tissue. However, see your caregiver if you find anything that concerns you.  SEEK MEDICAL CARE IF:  You see a change in shape, contour, or size of your breasts or nipples.   You see skin changes, such as reddened or scaly areas on the breasts or nipples.   You have an unusual discharge from your nipples.   You feel a new lump or unusually thick areas.  Document Released: 07/11/2005 Document Revised: 06/27/2012 Document Reviewed: 10/26/2011 [facility] Patient Information 2015 [place], [place]. This information is not intended to replace advice given to you by your health care provider. Make sure you discuss any questions you have with your health care provider.

## 2014-02-14 NOTE — Progress Notes (Signed)
OFFICE PROGRESS NOTE  CC  POLITE,[name], MD [address] [place] [address] Dr. Rolm Bookbinder Dr. 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 female whose cancer history began in 1999 when she was diagnosed with [name] IIIB colon cancer. She underwent [name] colectomy followed by 6 months of chemotherapy under the care of Dr. Sonny Dandy  #2 She had [name] T2 N0 renal cell carcinoma the right kidney in about that same time. In November 2001 she had [name] T1 N0 non-small cell lung cancer removed from the right lung with [name] VATS procedure by Dr. Arlyce Dice. She has been monitored with serial colonoscopies and has had several adenomas removed. She was seen at the [facility] but was not tested that I can see. She says she is in some sort of study looking at her and as well as the rest of her family due to the high rate of cancers there.   #3 [name] colonoscopy in September of last year showed an ulcerative mass in the transverse colon. Biopsy revealed polyps that were negative for dysplasia. Surgical excision was recommended. 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 Margins were negative. 0/9 lymph nodes were positive. No adjuvant chemotherapy was recommended. She then underwent her screening mammogram in March of this year  #4 She then underwent her screening mammogram and new calcifications were noted. She underwent [name] biopsy on October 03, 2012 that demonstrated DCIS. 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 This was ER positive at 100% PR positive at 100%. Followup MRI revealed with artifact as well as [name] 3.2 x 1.4 x 0.8 cm mass in the upper and her region of the right breast. This had features suspicious for [name] papillary neoplasm. Ultrasound is  recommended. Ultrasound-guided biopsy of this area revealed an intraductal papilloma  #5 She met with Dr. Donne Hazel and discussed surgical options. They discussed mastectomy versus lumpectomy. She did have difficulties with wound healing after her colectomy and actually still has [name] bandage in place for this. She has been on disability since 2000 due to her multiple medical problems as well as her multiple cancers. She still has some pain from her biopsy  #6 patient is now status post lumpectomy with sentinel lymph node biopsy of the right breast performed on 12/20/2012. The final pathology revealed ductal carcinoma in situ nodes were negative on all pathologic staging is Tis N0, stage 0. Patient's case was discussed at the multidisciplinary breast clinic she is [name] good candidate for radiation therapy followed by adjuvant antiestrogen therapy. There was [name] questionable margin. This was discussed with the patient.  She underwent re-excision on 01/21/13.  Pathology revealed no residual carcinoma or atypia.  She was evaluated by Dr. Pablo Ledger who determined that no radiation therapy was needed due to her decision for re-excision and an aromatase inhibitor.   CURRENT THERAPY: Aromasin 25 mg daily  INTERVAL HISTORY: Amy [name] 68 y.o. female returns for followup and evaluation of her h/o DCIS of the right breast.  She is taking Aromasin daily and does have mild difficulty with joint aches and hot flashes.  She is tolerating these moderately well.  She denies vaginal dryness, discharge, bleeding, bowel/bladder problems, new pain, weakness, headaches, shortness of breath, or any further concerns. We reviewed her health maintenance below.  MEDICAL HISTORY: Past Medical History  Diagnosis Date  . Depression   . Diabetes mellitus     diet controlled  . Anemia   . Anxiety   . Arthritis   . Hernia     Near umbilicus  . Hemorrhoids   . Heart murmur   . Cancer     colon,renal,lung  . Lung cancer 2001  .  Colon cancer 1999, 2013  . Breast cancer, right breast 10/03/12  . Hypertension     Does not see [name] cardiologist  . Allergy   . Blood transfusion without reported diagnosis 1999    prbc S/P COLON SURGERY  . Cataract     NO SURGERY  . GERD (gastroesophageal reflux disease)   . Chronic kidney disease 1999    RIGHT KIDNEY REMOVED  . Neuromuscular disorder     PERIPHERAL NEUROPATHY FEET  . Ulcer     HX YEARS AGO  . Shortness of breath     with exertion   . Hot flashes     5 yr pill for breast CA causing hot flashes    ALLERGIES:  is allergic to iodine; iohexol; lactose intolerance (gi); adhesive; and latex.  MEDICATIONS:  Current Outpatient Prescriptions  Medication Sig Dispense Refill  . ALPRAZolam (XANAX) 1 MG tablet Take 0.5 mg by mouth at bedtime.       . Ascorbic Acid (VITAMIN C WITH ROSE HIPS) 500 MG tablet Take 500 mg by mouth every morning.       Marland Kitchen atorvastatin (LIPITOR) 20 MG tablet Take 20 mg by mouth daily before breakfast.       . Calcium Carbonate-Vitamin D (CALCIUM-VITAMIN D) 500-200 MG-UNIT per tablet Take 1 tablet by mouth 4 (four) times [name] week. Pt takes medication every other day.      . exemestane (AROMASIN) 25 MG tablet Take 1 tablet (25 mg total) by mouth daily after breakfast.  90 tablet  6  . Ferrous Gluconate (IRON) 240 (27 FE) MG TABS Take 1 tablet by mouth every morning.       . hydrochlorothiazide 25 MG tablet Take 25 mg by mouth daily before breakfast.       . losartan (COZAAR) 50 MG tablet Take 50 mg by mouth daily.      . metFORMIN (GLUCOPHAGE) 500 MG tablet Take 500 mg by mouth daily with breakfast.       . metoprolol (LOPRESSOR) 100 MG tablet Take 100 mg by mouth daily before breakfast.       . Multiple Vitamin (MULTIVITAMIN WITH MINERALS) TABS Take 1 tablet by mouth every morning. She takes Dance movement psychotherapist Adult 50+.      Marland Kitchen omeprazole (PRILOSEC) 20 MG capsule Take 20 mg by mouth every morning.       Marland Kitchen PARoxetine (PAXIL) 40 MG tablet Take 40 mg by mouth  daily before breakfast.       . traMADol (ULTRAM) 50 MG tablet Take 50 mg by mouth every 6 (six) hours as needed for pain.        No current facility-administered medications for this visit.    SURGICAL HISTORY:  Past Surgical History  Procedure Laterality Date  . Colonoscopy    . Polypectomy    . Lung lobectomy      right lower  . Colon surgery      chemo  . Kidney surgery  1999    right/chemo kidney removed for cancer  . Partial hysterectomy    . Breast biopsy  Bilateral cysts/benign  . Colectomy  05-31-2012    laparoscopic ,possible open  . Abdominal hysterectomy  80's    fibroids  . Tonsillectomy      as child  . Partial colectomy  05/31/2012    Procedure: PARTIAL COLECTOMY;  Surgeon: Rolm Bookbinder, MD;  Location: [facility];  Service: General;  Laterality: N/[name];  . Breast biopsy Right 10/03/12    ER/PR +  . Breast lumpectomy with needle localization Right 12/20/2012    Procedure: BREAST LUMPECTOMY WITH NEEDLE LOCALIZATION;  Surgeon: Rolm Bookbinder, MD;  Location: [facility];  Service: General;  Laterality: Right;  . Breast biopsy Right 12/20/2012    Procedure: Right breast Excisional Biopsy;  Surgeon: Rolm Bookbinder, MD;  Location: [facility];  Service: General;  Laterality: Right;  . Re-excision of breast cancer,superior margins Right 01/21/2013    Procedure: RE-EXCISION OF BREAST CANCER,SUPERIOR MARGINS;  Surgeon: Rolm Bookbinder, MD;  Location: [facility];  Service: General;  Laterality: Right;    REVIEW OF SYSTEMS:  [name] 10 point review of systems was conducted and is otherwise negative except for what is noted above.    Health Maintenance  Mammogram: 10/01/2013 Colonoscopy: 03/2012 Bone Density Scan: 04/24/13, normal Pap Smear: partial hysterectomy in 1980s Eye Exam: annually Vitamin D Level: pending  Lipid Panel: 06/2013   PHYSICAL EXAMINATION: Blood pressure 138/82, pulse 68, temperature 98.4 F (36.9 C), temperature source Oral, resp. rate 18, height 5' 5.5" (1.664  m), weight 259 lb (117.482 kg), last menstrual period 07/26/1983. Body mass index is 42.43 kg/(m^2). GENERAL: Patient is [name] well appearing female in no acute distress HEENT:  Sclerae anicteric.  Oropharynx clear and moist. No ulcerations or evidence of oropharyngeal candidiasis. Neck is supple.  NODES:  No cervical, supraclavicular, or axillary lymphadenopathy palpated.  BREAST EXAM: right breast s/p lumpectomy, no masses, nodules or lesions, left breast no masses, nodules or lesions, benign bilateral breast exams LUNGS:  Clear to auscultation bilaterally.  No wheezes or rhonchi. HEART:  Regular rate and rhythm. No murmur appreciated. ABDOMEN:  Soft, nontender.  Positive, normoactive bowel sounds. No organomegaly palpated. MSK:  No focal spinal tenderness to palpation. Full range of motion bilaterally in the upper extremities. EXTREMITIES:  No peripheral edema.   SKIN:  Clear with no obvious rashes or skin changes. No nail dyscrasia. NEURO:  Nonfocal. Well oriented.  Appropriate affect. ECOG PERFORMANCE STATUS: 0 - Asymptomatic   LABORATORY DATA: Lab Results  Component Value Date   WBC 5.7 02/14/2014   HGB 13.5 02/14/2014   HCT 41.8 02/14/2014   MCV 87.9 02/14/2014   PLT 145 02/14/2014      Chemistry      Component Value Date/Time   NA 143 02/14/2014 1336   NA 142 12/12/2012 1035   K 3.5 02/14/2014 1336   K 3.8 12/12/2012 1035   CL 100 12/27/2012 1418   CL 99 12/12/2012 1035   CO2 33* 02/14/2014 1336   CO2 30 12/12/2012 1035   BUN 18.7 02/14/2014 1336   BUN 18 12/12/2012 1035   CREATININE 0.9 02/14/2014 1336   CREATININE 0.88 12/12/2012 1035      Component Value Date/Time   CALCIUM 10.0 02/14/2014 1336   CALCIUM 10.1 12/12/2012 1035   ALKPHOS 90 02/14/2014 1336   ALKPHOS 67 04/02/2012 1147   AST 16 02/14/2014 1336   AST 22 04/02/2012 1147   ALT 12 02/14/2014 1336   ALT 19 04/02/2012 1147   BILITOT 0.79 02/14/2014 1336   BILITOT 0.9 04/02/2012 1147  Diagnosis 1. Breast, lumpectomy, Right -  SCLEROSED INTRADUCTAL PAPILLOMA SEE COMMENT. - NEGATIVE FOR ATYPIA OR MALIGNANCY. - PREVIOUS BIOPSY SITE CHANGE. - MICROCALCIFICATIONS IDENTIFIED. 2. Breast, lumpectomy, Right - DUCTAL CARCINOMA IN SITU, SEE COMMENT. - IN SITU CARCINOMA IS 1 MM FROM NEAREST SUPERIOR AND INFERIOR MARGIN. - SEE TUMOR TEMPLATE BELOW. 3. Breast, excision, Right - BENIGN BREAST TISSUE, SEE COMMENT. - NEGATIVE FOR ATYPIA OR MALIGNANCY. - MICROCALCIFICATIONS IDENTIFIED. - SURGICAL MARGIN, NEGATIVE FOR ATYPIA OR MALIGNANCY. Microscopic Comment 1. The surgical resection margin(s) of the specimen were inked and microscopically evaluated. 2. BREAST, IN SITU CARCINOMA Specimen, including laterality: Right breast Procedure: Lumpectomy Grade of carcinoma: II of III Necrosis: Present Estimated tumor size: (glass slide measurement): See comment Treatment effect: None If present, treatment effect in breast tissue, lymph nodes or both: N/[name] Distance to closest margin: 1 mm See above. If margin positive, focally or broadly: N/[name] Breast prognostic profile: Not repeated Estrogen receptor: Previous study demonstrated 100% positivity ([id number]) Progesterone receptor: Previous study demonstrated 100% positivity ([id number]) Lymph nodes: Examined: 0 Lymph nodes with metastasis: N/[name] 1 of 3 FINAL for Amy [name], Amy [name] [phone]) Microscopic Comment(continued) TNM: pTis, pNX Comments: Within the 1.7 cm nodular area associated with the localization needle / clip are foci of foci of in situ carcinoma, the largest of which spans 3 cm. Additional non-neoplastic findings include fibrocystic change, usual ductal hyperplasia, fibroadenomatoid nodules and microcalcifications in benign ducts and lobules. Representative sections of the submitted skin demonstrate benign skin with nonspecific inflammation and fibrosis. There are no atypical or malignant features present. 3. The surgical resection margin(s) of the specimen were inked  and microscopically evaluated. Representative sections demonstrate non-neoplastic findings to include fibrocystic ductal hyperplasia. There are microcalcifications identified in benign ducts and lobules. There are no atypia or malignant epithelial stromal features present. (CRR:caf 12/25/12) [place]   RADIOGRAPHIC STUDIES:  No results found.  ASSESSMENT: 68 year old female with  #1 Right breast DCIS that is ER positive PR positive s/p lumpectomy with close margin. She underwent re-excision on 01/21/13. Pathology revealed no residual carcinoma or atypia. She was evaluated by Dr. Pablo Ledger who determined that no radiation therapy was needed due to her decision for re-excision and an aromatase inhibitor.   #2  The patient was started on adjuvant daily Aromasin, 28m on 04/02/2013.  She is tolerating this moderately well.     PLAN:   MJaycelynnis doing very well today.  Although she is having mild difficulty with hot flashes and joint aches, she states is better than cancer and she is tolerating these side effects.  She has no sign of recurrence.  She will continue Aromasin daily.    We did discuss survivorship.  I recommended [name] healthy diet, exercise, monthly breast exams.  She will return in 6 months for labs and evaluation.    All questions were answered. The patient knows to call the clinic with any problems, questions or concerns. We can certainly see the patient much sooner if necessary.  I spent 25 minutes counseling the patient face to face. The total time spent in the appointment was 30 minutes.  LMinette Headland [phone]

## 2014-02-14 NOTE — Telephone Encounter (Signed)
cld & left pt message about appt-per pof to sch pt appt-sch and mailed pt sch

## 2014-02-15 LAB — VITAMIN D 25 HYDROXY (VIT D DEFICIENCY, FRACTURES): VIT D 25 HYDROXY: 59 ng/mL (ref 30–89)

## 2014-02-17 ENCOUNTER — Telehealth: Payer: Self-pay | Admitting: *Deleted

## 2014-02-17 ENCOUNTER — Encounter: Payer: Self-pay | Admitting: *Deleted

## 2014-02-17 NOTE — Telephone Encounter (Signed)
As noted below, called pt to inform her of Vit D level which was normal(59). Also I noted in pt chart that she quit smoking as of 11/20/2013. Pt verbalized understanding. No further concerns. Message to be forwarded to Charlestine Massed, NP.

## 2014-02-17 NOTE — Telephone Encounter (Signed)
Message copied by Harmon Pier on Mon Feb 17, 2014  5:37 PM ------      Message from: Minette Headland      Created: Sat Feb 15, 2014  7:10 AM       Please call patient.  Her vitamin d level is normal.            Thanks, LC       ----- Message -----         From: [facility]         Sent: 02/14/2014   1:48 PM           To: Minette Headland, NP                   ------

## 2014-03-28 ENCOUNTER — Other Ambulatory Visit: Payer: Self-pay | Admitting: Internal Medicine

## 2014-03-28 DIAGNOSIS — Z09 Encounter for follow-up examination after completed treatment for conditions other than malignant neoplasm: Secondary | ICD-10-CM

## 2014-04-03 ENCOUNTER — Ambulatory Visit
Admission: RE | Admit: 2014-04-03 | Discharge: 2014-04-03 | Disposition: A | Discharge status: Home / Self Care | Payer: Medicare Other | Source: Ambulatory Visit | Attending: Internal Medicine | Admitting: Internal Medicine

## 2014-04-03 ENCOUNTER — Encounter (INDEPENDENT_AMBULATORY_CARE_PROVIDER_SITE_OTHER): Payer: Self-pay

## 2014-04-03 DIAGNOSIS — Z09 Encounter for follow-up examination after completed treatment for conditions other than malignant neoplasm: Secondary | ICD-10-CM

## 2014-04-09 ENCOUNTER — Other Ambulatory Visit: Payer: Self-pay | Admitting: Internal Medicine

## 2014-04-09 DIAGNOSIS — E041 Nontoxic single thyroid nodule: Secondary | ICD-10-CM

## 2014-04-21 ENCOUNTER — Other Ambulatory Visit: Payer: Self-pay | Admitting: Oncology

## 2014-04-21 DIAGNOSIS — C50911 Malignant neoplasm of unspecified site of right female breast: Secondary | ICD-10-CM

## 2014-04-23 ENCOUNTER — Ambulatory Visit
Admission: RE | Admit: 2014-04-23 | Discharge: 2014-04-23 | Disposition: A | Discharge status: Home / Self Care | Payer: Medicare Other | Source: Ambulatory Visit | Attending: Internal Medicine | Admitting: Internal Medicine

## 2014-04-23 ENCOUNTER — Other Ambulatory Visit (HOSPITAL_COMMUNITY)
Admission: RE | Admit: 2014-04-23 | Discharge: 2014-04-23 | Disposition: A | Discharge status: Home / Self Care | Payer: Medicare Other | Source: Ambulatory Visit | Attending: Interventional Radiology | Admitting: Interventional Radiology

## 2014-04-23 DIAGNOSIS — E041 Nontoxic single thyroid nodule: Secondary | ICD-10-CM | POA: Diagnosis present

## 2014-05-26 ENCOUNTER — Encounter: Payer: Self-pay | Admitting: *Deleted

## 2014-07-14 ENCOUNTER — Other Ambulatory Visit: Payer: Self-pay | Admitting: *Deleted

## 2014-07-14 DIAGNOSIS — C50911 Malignant neoplasm of unspecified site of right female breast: Secondary | ICD-10-CM

## 2014-07-14 MED ORDER — EXEMESTANE 25 MG PO TABS: 25.0000 mg | ORAL_TABLET | Freq: Every day | ORAL | Status: DC

## 2014-08-05 ENCOUNTER — Encounter: Payer: Self-pay | Admitting: Gastroenterology

## 2014-08-18 ENCOUNTER — Encounter: Payer: Self-pay | Admitting: Gastroenterology

## 2014-08-19 ENCOUNTER — Telehealth: Payer: Self-pay | Admitting: Hematology and Oncology

## 2014-08-19 NOTE — Telephone Encounter (Signed)
, °

## 2014-08-21 ENCOUNTER — Ambulatory Visit: Payer: Medicare Other | Admitting: Hematology and Oncology

## 2014-08-21 ENCOUNTER — Other Ambulatory Visit: Payer: Medicare Other

## 2014-08-27 ENCOUNTER — Other Ambulatory Visit: Payer: Self-pay | Admitting: Oncology

## 2014-08-27 ENCOUNTER — Other Ambulatory Visit: Payer: Self-pay | Admitting: Internal Medicine

## 2014-08-27 ENCOUNTER — Other Ambulatory Visit: Payer: Self-pay | Admitting: Hematology and Oncology

## 2014-08-27 DIAGNOSIS — Z853 Personal history of malignant neoplasm of breast: Secondary | ICD-10-CM

## 2014-09-01 ENCOUNTER — Other Ambulatory Visit: Payer: Self-pay | Admitting: *Deleted

## 2014-09-01 DIAGNOSIS — C50911 Malignant neoplasm of unspecified site of right female breast: Secondary | ICD-10-CM

## 2014-09-02 ENCOUNTER — Ambulatory Visit (HOSPITAL_BASED_OUTPATIENT_CLINIC_OR_DEPARTMENT_OTHER): Payer: Medicare Other | Admitting: Hematology and Oncology

## 2014-09-02 ENCOUNTER — Other Ambulatory Visit (HOSPITAL_BASED_OUTPATIENT_CLINIC_OR_DEPARTMENT_OTHER): Payer: Medicare Other

## 2014-09-02 ENCOUNTER — Telehealth: Payer: Self-pay | Admitting: Hematology and Oncology

## 2014-09-02 VITALS — BP 152/74 | HR 80 | Temp 98.0°F | Resp 19 | Ht 65.0 in | Wt 258.8 lb

## 2014-09-02 DIAGNOSIS — Z85118 Personal history of other malignant neoplasm of bronchus and lung: Secondary | ICD-10-CM

## 2014-09-02 DIAGNOSIS — Z85528 Personal history of other malignant neoplasm of kidney: Secondary | ICD-10-CM

## 2014-09-02 DIAGNOSIS — C50911 Malignant neoplasm of unspecified site of right female breast: Secondary | ICD-10-CM

## 2014-09-02 DIAGNOSIS — Z85038 Personal history of other malignant neoplasm of large intestine: Secondary | ICD-10-CM

## 2014-09-02 DIAGNOSIS — Z853 Personal history of malignant neoplasm of breast: Secondary | ICD-10-CM

## 2014-09-02 DIAGNOSIS — Z803 Family history of malignant neoplasm of breast: Secondary | ICD-10-CM

## 2014-09-02 LAB — COMPREHENSIVE METABOLIC PANEL (CC13)
ALBUMIN: 3.9 g/dL (ref 3.5–5.0)
ALT: 20 U/L (ref 0–55)
AST: 18 U/L (ref 5–34)
Alkaline Phosphatase: 90 U/L (ref 40–150)
Anion Gap: 13 mEq/L — ABNORMAL HIGH (ref 3–11)
BUN: 20.6 mg/dL (ref 7.0–26.0)
CALCIUM: 9.8 mg/dL (ref 8.4–10.4)
CHLORIDE: 100 meq/L (ref 98–109)
CO2: 31 meq/L — AB (ref 22–29)
CREATININE: 1.1 mg/dL (ref 0.6–1.1)
EGFR: 59 mL/min/{1.73_m2} — ABNORMAL LOW (ref >90)
Glucose: 161 mg/dl — ABNORMAL HIGH (ref 70–140)
POTASSIUM: 3.4 meq/L — AB (ref 3.5–5.1)
SODIUM: 144 meq/L (ref 136–145)
TOTAL PROTEIN: 7 g/dL (ref 6.4–8.3)
Total Bilirubin: 0.66 mg/dL (ref 0.20–1.20)

## 2014-09-02 LAB — CBC WITH DIFFERENTIAL/PLATELET
BASO%: 0.4 % (ref 0.0–2.0)
BASOS ABS: 0 10*3/uL (ref 0.0–0.1)
EOS%: 1.8 % (ref 0.0–7.0)
Eosinophils Absolute: 0.1 10*3/uL (ref 0.0–0.5)
HCT: 41.3 % (ref 34.8–46.6)
HEMOGLOBIN: 13.4 g/dL (ref 11.6–15.9)
LYMPH%: 37.5 % (ref 14.0–49.7)
MCH: 28.6 pg (ref 25.1–34.0)
MCHC: 32.4 g/dL (ref 31.5–36.0)
MCV: 88.1 fL (ref 79.5–101.0)
MONO#: 0.3 10*3/uL (ref 0.1–0.9)
MONO%: 5.5 % (ref 0.0–14.0)
NEUT%: 54.8 % (ref 38.4–76.8)
NEUTROS ABS: 3.1 10*3/uL (ref 1.5–6.5)
PLATELETS: 134 10*3/uL — AB (ref 145–400)
RBC: 4.69 10*6/uL (ref 3.70–5.45)
RDW: 14.7 % — ABNORMAL HIGH (ref 11.2–14.5)
WBC: 5.7 10*3/uL (ref 3.9–10.3)
lymph#: 2.1 10*3/uL (ref 0.9–3.3)

## 2014-09-02 NOTE — Telephone Encounter (Signed)
, °

## 2014-09-02 NOTE — Progress Notes (Signed)
Patient Care Team: Kandice Hams, MD as PCP - General (Internal Medicine)  DIAGNOSIS: No matching staging information was found for the patient.  SUMMARY OF ONCOLOGIC HISTORY:   Non-small cell cancer of right lung   06/02/2000 Surgery T1 N0 non-small cell lung cancer removed from the right lung with a VATS procedure by Dr. Arlyce Dice    Breast cancer, right breast   12/20/2012 Surgery Right breast lumpectomy: DCIS, resection for margin 01/21/2013, ER 100%, PR 100%; did not undergo radiation therapy   01/30/2013 -  Anti-estrogen oral therapy Aromasin 25 mg daily    Colon cancer   05/31/2012 Surgery Right: Segmental resection: Invasive well-differentiated mucinous adenocarcinoma invading the submucosa, no angiolymphatic invasion, 0/9 lymph nodes positive, T1 N0 M0, loss of expression of PMS 2, and MLH 1    CHIEF COMPLIANT:  Follow-up of DCIS on Aromasin  INTERVAL HISTORY: Amy [name] is a  69 year old lady with above-mentioned history of right-sided DCIS treated with lumpectomy and did not require radiation. She is currently on oral hormonal therapy with Aromasin and appears to be tolerating it fairly except for hot flashes and muscle aches and pains.  REVIEW OF SYSTEMS:   Constitutional: Denies fevers, chills or abnormal weight loss Eyes: Denies blurriness of vision Ears, nose, mouth, throat, and face: Denies mucositis or sore throat Respiratory: Denies cough, dyspnea or wheezes Cardiovascular: Denies palpitation, chest discomfort or lower extremity swelling Gastrointestinal:  Denies nausea, heartburn or change in bowel habits Skin: Denies abnormal skin rashes Lymphatics: Denies new lymphadenopathy or easy bruising Neurological:Denies numbness, tingling or new weaknesses Behavioral/Psych: Mood is stable, no new changes  Breast:  denies any pain or lumps or nodules in either breasts All other systems were reviewed with the patient and are negative.  I have reviewed the past medical  history, past surgical history, social history and family history with the patient and they are unchanged from previous note.  ALLERGIES:  is allergic to iodine; iohexol; lactose intolerance (gi); adhesive; and latex.  MEDICATIONS:  Current Outpatient Prescriptions  Medication Sig Dispense Refill  . ALPRAZolam (XANAX) 1 MG tablet Take 0.5 mg by mouth at bedtime.     . Ascorbic Acid (VITAMIN C WITH ROSE HIPS) 500 MG tablet Take 500 mg by mouth every morning.     Marland Kitchen atorvastatin (LIPITOR) 20 MG tablet Take 20 mg by mouth daily before breakfast.     . Calcium Carbonate-Vitamin D (CALCIUM-VITAMIN D) 500-200 MG-UNIT per tablet Take 1 tablet by mouth 4 (four) times a week. Pt takes medication every other day.    . chlorhexidine (PERIDEX) 0.12 % solution   2  . exemestane (AROMASIN) 25 MG tablet Take 1 tablet (25 mg total) by mouth daily after breakfast. 90 tablet 1  . Ferrous Gluconate (IRON) 240 (27 FE) MG TABS Take 1 tablet by mouth every morning.     . hydrochlorothiazide 25 MG tablet Take 25 mg by mouth daily before breakfast.     . losartan (COZAAR) 50 MG tablet Take 50 mg by mouth daily.    . metFORMIN (GLUCOPHAGE) 500 MG tablet Take 500 mg by mouth daily with breakfast.     . metoprolol (LOPRESSOR) 100 MG tablet Take 100 mg by mouth daily before breakfast.     . Multiple Vitamin (MULTIVITAMIN WITH MINERALS) TABS Take 1 tablet by mouth every morning. She takes Dance movement psychotherapist Adult 50+.    Marland Kitchen omeprazole (PRILOSEC) 20 MG capsule Take 20 mg by mouth every morning.     Marland Kitchen  PARoxetine (PAXIL) 40 MG tablet Take 40 mg by mouth daily before breakfast.     . traMADol (ULTRAM) 50 MG tablet Take 50 mg by mouth every 6 (six) hours as needed for pain.      No current facility-administered medications for this visit.    PHYSICAL EXAMINATION: ECOG PERFORMANCE STATUS: 1 - Symptomatic but completely ambulatory  Filed Vitals:   09/02/14 1423  BP: 152/74  Pulse: 80  Temp: 98 F (36.7 C)  Resp: 19    Filed Weights   09/02/14 1423  Weight: 258 lb 12.8 oz (117.391 kg)    GENERAL:alert, no distress and comfortable SKIN: skin color, texture, turgor are normal, no rashes or significant lesions EYES: normal, Conjunctiva are pink and non-injected, sclera clear OROPHARYNX:no exudate, no erythema and lips, buccal mucosa, and tongue normal  NECK: supple, thyroid normal size, non-tender, without nodularity LYMPH:  no palpable lymphadenopathy in the cervical, axillary or inguinal LUNGS: clear to auscultation and percussion with normal breathing effort HEART: regular rate & rhythm and no murmurs and no lower extremity edema ABDOMEN:abdomen soft, non-tender and normal bowel sounds Musculoskeletal:no cyanosis of digits and no clubbing  NEURO: alert & oriented x 3 with fluent speech, no focal motor/sensory deficits BREAST: No palpable masses or nodules in either right or left breasts. No palpable axillary supraclavicular or infraclavicular adenopathy no breast tenderness or nipple discharge. (exam performed in the presence of a chaperone)  LABORATORY DATA:  I have reviewed the data as listed   Chemistry      Component Value Date/Time   NA 144 09/02/2014 1412   NA 142 12/12/2012 1035   K 3.4* 09/02/2014 1412   K 3.8 12/12/2012 1035   CL 100 12/27/2012 1418   CL 99 12/12/2012 1035   CO2 31* 09/02/2014 1412   CO2 30 12/12/2012 1035   BUN 20.6 09/02/2014 1412   BUN 18 12/12/2012 1035   CREATININE 1.1 09/02/2014 1412   CREATININE 0.88 12/12/2012 1035      Component Value Date/Time   CALCIUM 9.8 09/02/2014 1412   CALCIUM 10.1 12/12/2012 1035   ALKPHOS 90 09/02/2014 1412   ALKPHOS 67 04/02/2012 1147   AST 18 09/02/2014 1412   AST 22 04/02/2012 1147   ALT 20 09/02/2014 1412   ALT 19 04/02/2012 1147   BILITOT 0.66 09/02/2014 1412   BILITOT 0.9 04/02/2012 1147       Lab Results  Component Value Date   WBC 5.7 09/02/2014   HGB 13.4 09/02/2014   HCT 41.3 09/02/2014   MCV 88.1  09/02/2014   PLT 134* 09/02/2014   NEUTROABS 3.1 09/02/2014   ASSESSMENT & PLAN:  Breast cancer, right breast Right breast DCIS that is ER positive PR positive s/p lumpectomy with close margin. She underwent re-excision on 01/21/13. Pathology revealed no residual carcinoma or atypia. Did not require radiation currently on hormonal therapy with Aromasin 25 mg daily from 04/02/2013  Aromasin toxicities: No major side effects from Aromasin other than occasional hot flashes and joint pains Breast cancer surveillance: 1. Breast exam done 09/02/2014 is normal 2. Mammograms to be done later this month  I recommended genetics counseling because her mother and her grandmother both had breast cancers and with her extensive personal history of multiple cancers including lung cancer, colon cancer, kidney cancer and now breast DCIS. Return to clinic in 6 months for follow-up  lung cancer stage I, renal cell cancer s/p resection, colon cancer stage IIIB status post surgery and chemotherapy in 1999:  All of these appear to be in remission   No orders of the defined types were placed in this encounter.   The patient has a good understanding of the overall plan. she agrees with it. She will call with any problems that may develop before her next visit here.   Rulon Eisenmenger, MD

## 2014-09-02 NOTE — Assessment & Plan Note (Signed)
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 She underwent re-excision on 01/21/13. Pathology revealed no residual carcinoma or atypia. Did not require radiation currently on hormonal therapy with Aromasin 25 mg daily from 04/02/2013  Aromasin toxicities: No major side effects from Aromasin other than occasional hot flashes and joint pains Breast cancer surveillance: 1. Breast exam done 09/02/2014 is normal 2. Mammograms to be done later this month  I recommended genetics counseling because her mother and her grandmother both had breast cancers and with her extensive personal history of multiple cancers including lung cancer, colon cancer, kidney cancer and now breast DCIS. Return to clinic in 6 months for follow-up

## 2014-09-24 ENCOUNTER — Ambulatory Visit (HOSPITAL_BASED_OUTPATIENT_CLINIC_OR_DEPARTMENT_OTHER): Payer: Medicare Other | Admitting: Genetic Counselor

## 2014-09-24 ENCOUNTER — Encounter: Payer: Self-pay | Admitting: Genetic Counselor

## 2014-09-24 ENCOUNTER — Other Ambulatory Visit: Payer: Medicare Other

## 2014-09-24 ENCOUNTER — Ambulatory Visit (AMBULATORY_SURGERY_CENTER): Payer: Self-pay | Admitting: *Deleted

## 2014-09-24 VITALS — Ht 66.0 in | Wt 260.8 lb

## 2014-09-24 DIAGNOSIS — Z85528 Personal history of other malignant neoplasm of kidney: Secondary | ICD-10-CM | POA: Diagnosis not present

## 2014-09-24 DIAGNOSIS — Z85038 Personal history of other malignant neoplasm of large intestine: Secondary | ICD-10-CM | POA: Insufficient documentation

## 2014-09-24 DIAGNOSIS — Z85118 Personal history of other malignant neoplasm of bronchus and lung: Secondary | ICD-10-CM | POA: Diagnosis not present

## 2014-09-24 DIAGNOSIS — Z86010 Personal history of colonic polyps: Secondary | ICD-10-CM

## 2014-09-24 DIAGNOSIS — C50911 Malignant neoplasm of unspecified site of right female breast: Secondary | ICD-10-CM | POA: Diagnosis not present

## 2014-09-24 MED ORDER — MOVIPREP 100 G PO SOLR: 1.0000 | Freq: Once | ORAL | Status: DC

## 2014-09-24 NOTE — Progress Notes (Signed)
REFERRING PROVIDER: Kandice Hams, MD [address], [place] [address]   Nicholas Lose, MD  PRIMARY PROVIDER:  Kandice Hams, MD  PRIMARY REASON FOR VISIT:  1. Breast cancer, right breast   2. History of kidney cancer   3. History of lung cancer   4. History of colon cancer      HISTORY OF PRESENT ILLNESS:   Amy [name], a 69 y.o. female, was seen for a [facility] cancer genetics consultation at the request of Dr. Lindi Adie due to a personal and family history of cancer.  [name] presents to clinic today to discuss the possibility of a hereditary predisposition to cancer, genetic testing, and to further clarify her future cancer risks, as well as potential cancer risks for family members.   In 1999, at the age of 16, Amy [name] was diagnosed with colon and kidney cancer.  These were treated with chemotherapy and surgery.  In 2000 Amy [name] was diagnosed with lung cancer, and part of her right lung was removed.  She went through chemotherapy for this as well.  In 2014, Amy [name] had a recurrance of her colon cancer, and ultimately was diagnosed with breast cancer in 2015.  She had a lumpectomy and is currently taking tamoxifen.    CANCER HISTORY:    Non-small cell cancer of right lung   06/02/2000 Surgery T1 N0 non-small cell lung cancer removed from the right lung with a VATS procedure by Dr. 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  First live birth at age 49.  OCP  use for approximately 10 years.  Ovaries intact: yes.  Hysterectomy: no.  Menopausal status: postmenopausal.  HRT use: 0 years. Colonoscopy: yes; has a history of polyps. Mammogram within the last year: yes. Number of breast biopsies: 2. Up to date with pelvic exams:  yes. Any excessive radiation exposure in the past:  no  Past Medical History  Diagnosis Date  . Depression   . Diabetes mellitus     diet controlled  . Anemia   . Anxiety   . Arthritis   . Hernia     Near umbilicus  . Hemorrhoids   . Heart murmur   . Cancer     colon,renal,lung  . Lung cancer 2001  . Colon cancer 1999, 2013  . Breast cancer, right breast 10/03/12  . Hypertension     Does not see a cardiologist  . Allergy   . Blood transfusion without reported diagnosis 1999    prbc S/P COLON SURGERY  . Cataract     NO SURGERY  . GERD (gastroesophageal reflux disease)   . Chronic kidney disease 1999    RIGHT KIDNEY REMOVED  . Neuromuscular disorder     PERIPHERAL NEUROPATHY FEET  . Ulcer     HX YEARS AGO  . Shortness of breath     with exertion   . Hot flashes     5 yr pill for breast CA causing hot flashes  Past Surgical History  Procedure Laterality Date  . Colonoscopy    . Polypectomy    . Lung lobectomy      right lower  . Colon surgery      chemo  . Kidney surgery  1999    right/chemo kidney removed for cancer  . Partial hysterectomy    . Breast biopsy      Bilateral cysts/benign  . Colectomy  05-31-2012    laparoscopic ,possible open  . Abdominal hysterectomy  80's    fibroids  . Tonsillectomy      as child  . Partial colectomy  05/31/2012    Procedure: PARTIAL COLECTOMY;  Surgeon: Rolm Bookbinder, MD;  Location: [facility];  Service: General;  Laterality: N/A;  . Breast biopsy Right 10/03/12    ER/PR +  . Breast lumpectomy with needle localization Right 12/20/2012    Procedure: BREAST LUMPECTOMY WITH NEEDLE LOCALIZATION;  Surgeon: Rolm Bookbinder, MD;  Location: [facility];   Service: General;  Laterality: Right;  . Breast biopsy Right 12/20/2012    Procedure: Right breast Excisional Biopsy;  Surgeon: Rolm Bookbinder, MD;  Location: [facility];  Service: General;  Laterality: Right;  . Re-excision of breast cancer,superior margins Right 01/21/2013    Procedure: RE-EXCISION OF BREAST CANCER,SUPERIOR MARGINS;  Surgeon: Rolm Bookbinder, MD;  Location: [facility];  Service: General;  Laterality: Right;    History   Social History  . Marital Status: Widowed    Spouse Name: N/A  . Number of Children: N/A  . Years of Education: N/A   Social History Main Topics  . Smoking status: Former Smoker -- 0.50 packs/day for 20 years    Types: Cigarettes    Start date: 10/30/1992    Quit date: 11/20/2013  . Smokeless tobacco: Never Used     Comment: quit then started up again 2years ago--smokes 1pack every 2-3 days  . Alcohol Use: No  . Drug Use: No  . Sexual Activity: No     Comment: TAH--ovaries retained   Other Topics Concern  . None   Social History Narrative     FAMILY HISTORY:  We obtained a detailed, 4-generation family history.  Significant diagnoses are listed below: Family History  Problem Relation Age of Onset  . Colon polyps Brother   . Multiple myeloma Brother 2  . Breast cancer Mother     dx in her 28s  . Heart disease Father   . Esophageal cancer Neg Hx   . Rectal cancer Neg Hx   . Stomach cancer Neg Hx   . Colon cancer Paternal Aunt   . Breast cancer Maternal Grandmother   . Colon cancer Cousin     2 paternal first cousins  . Leukemia Cousin 63   Amy [name]'s mother was an only child.  She was diagnosed with breast cancer in her 60s.  Amy [name] has a brother who has multiple myeloma and is doing "pretty well".  Her father died at 61 from an abdominal aneurysm.  Amy [name] father was one of 13 children.  He had a sister who had colon cancer, but none of the other siblings had cancer.  Two of Amy [name]'s paternal first cousins had colon cancer  as well. Patient's maternal ancestors are of [place] American descent, and paternal ancestors are of [place] American and Caucasian descent. There is no reported Ashkenazi Jewish ancestry. There is no known consanguinity.  GENETIC COUNSELING ASSESSMENT: Amy [name] is a 70 y.o. female with a personal history of  4 cancers and a family history of cancer which somewhat suggestive of a hereditary cancer syndrome and predisposition to cancer. We, therefore, discussed and recommended the following at today's visit.   DISCUSSION: We reviewed the characteristics, features and inheritance patterns of hereditary cancer syndromes. Based on her personal history and family history of cancer we discussed several hereditary cancer syndromes, including BRCA mutations and Lynch syndrome.  We also discussed genetic testing, including the appropriate family members to test, the process of testing, insurance coverage and turn-around-time for results. We discussed the implications of a negative, positive and/or variant of uncertain significant result. We recommended Amy [name] pursue genetic testing for the CancerNExt gene panel. The CancerNext gene panel offered by [employer] includes sequencing and rearrangement analysis for the following 32 genes:   APC, ATM, BARD1, BMPR1A, BRCA1, BRCA2, BRIP1, CDH1, CDK4, CDKN2A, CHEK2, EPCAM, GREM1, MLH1, MRE11A, MSH2, MSH6, MUTYH, NBN, NF1, PALB2, PMS2, POLD1, POLE, PTEN, RAD50, RAD51D, SMAD4, SMARCA4, STK11, and TP53.    Amy [name] was unsure about genetic testing.  Her son has passed away, and she is 83 and receiving a lot of screening already.  We discussed that other family members, such as her nieces and cousins may benefit from her testing.  Amy [name] has had 4 cancers, and therefore she is the best person to test in the family for informative results.  Amy [name] seemed to understand that information.  PLAN: Despite our recommendation, Amy [name] did not wish to pursue  genetic testing at today's visit. We understand this decision, and remain available to coordinate genetic testing at any time in the future. We therefore, recommend Amy [name] continue to follow the cancer screening guidelines given by her primary healthcare provider.  She will talk with her brother and nieces to determine if they desire her to undergo genetic testing.  If they feel that it would be useful, she will call back to reschedule her appointment.  Lastly, we encouraged Amy [name] to remain in contact with cancer genetics annually so that we can continuously update the family history and inform her of any changes in cancer genetics and testing that may be of benefit for this family.   Ms.  Kaminsky questions were answered to her satisfaction today. Our contact information was provided should additional questions or concerns arise. Thank you for the referral and allowing [place] to share in the care of your patient.   Lewis Grivas P. Florene Glen, [place], [facility] Certified Genetic Counselor [email or web address] phone: [phone]  The patient was seen for a total of 60 minutes in face-to-face genetic counseling.  This patient was discussed with Drs. Magrinat, Lindi Adie and/or Burr Medico who agrees with the above.    _______________________________________________________________________ For Office Staff:  Number of people involved in session: 1 Was an Intern/ student involved with case: yes

## 2014-09-24 NOTE — Progress Notes (Signed)
No egg or soy allergy No home 02 use No diet pills No issues with past sedation

## 2014-10-03 ENCOUNTER — Ambulatory Visit
Admission: RE | Admit: 2014-10-03 | Discharge: 2014-10-03 | Disposition: A | Discharge status: Home / Self Care | Payer: Medicare Other | Source: Ambulatory Visit | Attending: Hematology and Oncology | Admitting: Hematology and Oncology

## 2014-10-03 ENCOUNTER — Other Ambulatory Visit: Payer: Self-pay | Admitting: Hematology and Oncology

## 2014-10-03 DIAGNOSIS — Z853 Personal history of malignant neoplasm of breast: Secondary | ICD-10-CM

## 2014-10-07 ENCOUNTER — Other Ambulatory Visit: Payer: Self-pay | Admitting: Hematology and Oncology

## 2014-10-07 DIAGNOSIS — N63 Unspecified lump in unspecified breast: Secondary | ICD-10-CM

## 2014-10-08 ENCOUNTER — Ambulatory Visit (AMBULATORY_SURGERY_CENTER): Payer: Medicare Other | Admitting: Gastroenterology

## 2014-10-08 ENCOUNTER — Encounter: Payer: Self-pay | Admitting: Gastroenterology

## 2014-10-08 VITALS — BP 136/78 | HR 70 | Temp 97.1°F | Resp 17 | Ht 66.0 in | Wt 260.0 lb

## 2014-10-08 DIAGNOSIS — K621 Rectal polyp: Secondary | ICD-10-CM

## 2014-10-08 DIAGNOSIS — D125 Benign neoplasm of sigmoid colon: Secondary | ICD-10-CM

## 2014-10-08 DIAGNOSIS — D128 Benign neoplasm of rectum: Secondary | ICD-10-CM

## 2014-10-08 DIAGNOSIS — Z86010 Personal history of colonic polyps: Secondary | ICD-10-CM

## 2014-10-08 DIAGNOSIS — K635 Polyp of colon: Secondary | ICD-10-CM

## 2014-10-08 DIAGNOSIS — Z85038 Personal history of other malignant neoplasm of large intestine: Secondary | ICD-10-CM

## 2014-10-08 LAB — GLUCOSE, CAPILLARY
GLUCOSE-CAPILLARY: 147 mg/dL — AB (ref 70–99)
Glucose-Capillary: 107 mg/dL — ABNORMAL HIGH (ref 70–99)

## 2014-10-08 MED ORDER — SODIUM CHLORIDE 0.9 % IV SOLN: 500.0000 mL | INTRAVENOUS | Status: DC

## 2014-10-08 NOTE — Op Note (Signed)
[place]  [address] [place], [id number]   COLONOSCOPY PROCEDURE REPORT  PATIENT: Amy [name], Amy [name]  MR#: [id number] BIRTHDATE: [birth year] , 25  yrs. old GENDER: female ENDOSCOPIST: Ladene Artist, MD, [facility] PROCEDURE DATE:  10/08/2014 PROCEDURE:   Colonoscopy, surveillance , Colonoscopy with biopsy, and Colonoscopy with snare polypectomy First Screening Colonoscopy - Avg.  risk and is 50 yrs.  old or older - No.  Prior Negative Screening - Now for repeat screening. N/A  History of Adenoma - Now for follow-up colonoscopy & has been > or = to 3 yrs.  No.  It has been less than 3 yrs since last colonoscopy.  Medical reason. ASA CLASS:   Class III INDICATIONS:Surveillance due to prior colonic neoplasia, PH Colon or Rectal Adenocarcinoma, and PH Colon Adenoma. MEDICATIONS: Monitored anesthesia care and Propofol 150 mg IV DESCRIPTION OF PROCEDURE:   After the risks benefits and alternatives of the procedure were thoroughly explained, informed consent was obtained.  The digital rectal exam revealed no abnormalities of the rectum.   The [id number]  endoscope was introduced through the anus and advanced to the surgical anastomosis. No adverse events experienced.   The quality of the prep was good.  (MoviPrep was used)  The instrument was then slowly withdrawn as the colon was fully examined.  COLON FINDINGS: A sessile polyp measuring 6 mm in size was found in the sigmoid colon.  A polypectomy was performed with a cold snare. The resection was complete, the polyp tissue was completely retrieved and sent to histology.   Three sessile polyps measuring 4-5 mm in size were found in the rectum and sigmoid colon.  A polypectomy was performed.  Polypectomies were performed with cold forceps.  The resection was complete, the polyp tissue was completely retrieved and sent to histology.   There was evidence of a prior colo-colonic surgical anastomosis in the transverse  colon. The examination was otherwise normal.  Retroflexed views revealed internal Grade I hemorrhoids. The time to cecum = 1.3 Withdrawal time = 10.6   The scope was withdrawn and the procedure completed. COMPLICATIONS: There were no immediate complications.  ENDOSCOPIC IMPRESSION: 1.   Sessile polyp in the sigmoid colon; polypectomy performed with a cold snare 2.   Three sessile polyps in the rectum and sigmoid colon; polypectomies performed with cold forceps 3.   Prior colo-colonic surgical anastomosis in the transverse colon  4.   Grade l internal hemorrhoids  RECOMMENDATIONS: 1.  Await pathology results 2.  Repeat Colonoscopy in 1 year.  eSigned:  Ladene Artist, MD, [facility] 10/08/2014 11:33 AM

## 2014-10-08 NOTE — Patient Instructions (Signed)
Colon polyps and hemorrhoids. Handouts given on polyps and hemorrhoids. Repeat colonoscopy in 1 year. Call us with any questions or concerns. Thank you!  YOU HAD AN ENDOSCOPIC PROCEDURE TODAY AT [facility]:   Refer to the procedure report that was given to you for any specific questions about what was found during the examination.  If the procedure report does not answer your questions, please call your gastroenterologist to clarify.  If you requested that your care partner not be given the details of your procedure findings, then the procedure report has been included in a sealed envelope for you to review at your convenience later.  YOU SHOULD EXPECT: Some feelings of bloating in the abdomen. Passage of more gas than usual.  Walking can help get rid of the air that was put into your GI tract during the procedure and reduce the bloating. If you had a lower endoscopy (such as a colonoscopy or flexible sigmoidoscopy) you may notice spotting of blood in your stool or on the toilet paper. If you underwent a bowel prep for your procedure, you may not have a normal bowel movement for a few days.  Please Note:  You might notice some irritation and congestion in your nose or some drainage.  This is from the oxygen used during your procedure.  There is no need for concern and it should clear up in a day or so.  SYMPTOMS TO REPORT IMMEDIATELY:   Following lower endoscopy (colonoscopy or flexible sigmoidoscopy):  Excessive amounts of blood in the stool  Significant tenderness or worsening of abdominal pains  Swelling of the abdomen that is new, acute  Fever of 100F or higher   Following upper endoscopy (EGD)  Vomiting of blood or coffee ground material  New chest pain or pain under the shoulder blades  Painful or persistently difficult swallowing  New shortness of breath  Fever of 100F or higher  Black, tarry-looking stools  For urgent or emergent issues, a gastroenterologist can  be reached at any hour by calling [phone].   DIET: Your first meal following the procedure should be a small meal and then it is ok to progress to your normal diet. Heavy or fried foods are harder to digest and may make you feel nauseous or bloated.  Likewise, meals heavy in dairy and vegetables can increase bloating.  Drink plenty of fluids but you should avoid alcoholic beverages for 24 hours.  ACTIVITY:  You should plan to take it easy for the rest of today and you should NOT DRIVE or use heavy machinery until tomorrow (because of the sedation medicines used during the test).    FOLLOW UP: Our staff will call the number listed on your records the next business day following your procedure to check on you and address any questions or concerns that you may have regarding the information given to you following your procedure. If we do not reach you, we will leave a message.  However, if you are feeling well and you are not experiencing any problems, there is no need to return our call.  We will assume that you have returned to your regular daily activities without incident.  If any biopsies were taken you will be contacted by phone or by letter within the next 1-3 weeks.  Please call us at [phone] if you have not heard about the biopsies in 3 weeks.    SIGNATURES/CONFIDENTIALITY: You and/or your care partner have signed paperwork which will be entered  into your electronic medical record.  These signatures attest to the fact that that the information above on your After Visit Summary has been reviewed and is understood.  Full responsibility of the confidentiality of this discharge information lies with you and/or your care-partner.

## 2014-10-08 NOTE — Progress Notes (Signed)
Called to room to assist during endoscopic procedure.  Patient ID and intended procedure confirmed with present staff. Received instructions for my participation in the procedure from the performing physician.  

## 2014-10-08 NOTE — Progress Notes (Signed)
Report to PACU, RN, vss, BBS= Clear.  

## 2014-10-09 ENCOUNTER — Telehealth: Payer: Self-pay

## 2014-10-09 NOTE — Telephone Encounter (Signed)
  Follow up Call-  Call back number 10/08/2014 08/02/2013 04/02/2012  Post procedure Call Back phone  # [phone] hm  Permission to leave phone message Yes No Yes  comments - answer machine not working -     Patient questions:  Do you have a fever, pain , or abdominal swelling? No. Pain Score  0 *  Have you tolerated food without any problems? Yes.    Have you been able to return to your normal activities? Yes.    Do you have any questions about your discharge instructions: Diet   No. Medications  No. Follow up visit  No.  Do you have questions or concerns about your Care? No.  Actions: * If pain score is 4 or above: No action needed, pain <4.

## 2014-10-13 ENCOUNTER — Encounter: Payer: Self-pay | Admitting: Gastroenterology

## 2014-10-14 ENCOUNTER — Other Ambulatory Visit: Payer: Self-pay | Admitting: Hematology and Oncology

## 2014-10-14 ENCOUNTER — Ambulatory Visit
Admission: RE | Admit: 2014-10-14 | Discharge: 2014-10-14 | Disposition: A | Discharge status: Home / Self Care | Payer: Medicare Other | Source: Ambulatory Visit | Attending: Hematology and Oncology | Admitting: Hematology and Oncology

## 2014-10-14 DIAGNOSIS — N63 Unspecified lump in unspecified breast: Secondary | ICD-10-CM

## 2014-11-10 ENCOUNTER — Ambulatory Visit: Payer: Medicare Other | Admitting: Obstetrics and Gynecology

## 2014-11-10 ENCOUNTER — Encounter: Payer: Self-pay | Admitting: Obstetrics and Gynecology

## 2014-11-10 ENCOUNTER — Ambulatory Visit (INDEPENDENT_AMBULATORY_CARE_PROVIDER_SITE_OTHER): Payer: Medicare Other | Admitting: Obstetrics and Gynecology

## 2014-11-10 VITALS — BP 134/90 | HR 76 | Resp 24 | Ht 65.5 in | Wt 258.8 lb

## 2014-11-10 DIAGNOSIS — Z853 Personal history of malignant neoplasm of breast: Secondary | ICD-10-CM

## 2014-11-10 DIAGNOSIS — Z01419 Encounter for gynecological examination (general) (routine) without abnormal findings: Secondary | ICD-10-CM

## 2014-11-10 NOTE — Patient Instructions (Signed)

## 2014-11-10 NOTE — Progress Notes (Signed)
Patient ID: Amy [name], female   DOB: 04-13-1946, 69 y.o.   MRN: [id number] 68 y.o. G2P1010 WidowedAfrican AmericanF here for annual exam.   History of hysterectomy in 1980s for fibroids. Ovaries remain.   Patient with history of colon cancer, renal cancer, Rt. Breast cancer and lung cancer.  In 09/2014 had Rt. Breast bx revealing lobular neoplasia which she will be having surgery for soon. Planning on lumpectomy with Dr. Donne Hazel.  Waiting for hematoma to resolve.  Has a ventral hernia.   Up to void at night.  Drinks a lot of water daily.  No incontinence.   Some shortness of breath with exertion.  Not a new issue.   PCP - Dr. Maudry Mayhew.   Patient's last menstrual period was 07/26/1983.          Sexually active: No. female The current method of family planning is status post hysterectomy.    Exercising: No.  none. Smoker:  no  Health Maintenance: Pap:  06-14-02 wnl History of abnormal Pap:  no MMG:  10-03-14 Hx Rt. Breast Ca/lumpectomy;dense.  Pt. Had diag. Mammogram and U/S revealed probable benign Rt. Br. Masses--Bx revealed lobular neoplasia(atypical lobular hyperplasia) and she is to have excision soon. Colonoscopy:  10-08-14 benign polyps with Dr. Fuller Plan.  Next due 09/2015. BMD:   04/2013 wnl: [facility] TDaP:  PCP Screening Labs:   Hb today: PCP, Urine today: PCP   reports that she quit smoking about a year ago. Her smoking use included Cigarettes. She started smoking about 22 years ago. She has a 10 pack-year smoking history. She has never used smokeless tobacco. She reports that she does not drink alcohol or use illicit drugs.  Past Medical History  Diagnosis Date  . Depression   . Diabetes mellitus     diet controlled  . Anemia   . Anxiety   . Arthritis   . Hernia     Near umbilicus  . Hemorrhoids   . Heart murmur   . Cancer     colon,renal,lung  . Lung cancer 2001  . Colon cancer 1999, 2013  . Breast cancer, right breast 10/03/12  . Hypertension      Does not see a cardiologist  . Allergy   . Blood transfusion without reported diagnosis 1999    prbc S/P COLON SURGERY  . Cataract     NO SURGERY  . GERD (gastroesophageal reflux disease)   . Chronic kidney disease 1999    RIGHT KIDNEY REMOVED  . Neuromuscular disorder     PERIPHERAL NEUROPATHY FEET  . Ulcer     HX YEARS AGO  . Shortness of breath     with exertion   . Hot flashes     5 yr pill for breast CA causing hot flashes  . HX: breast cancer 2014    --right breast  . H/O: lung cancer   . History of kidney cancer   . History of colon cancer     Past Surgical History  Procedure Laterality Date  . Colonoscopy    . Polypectomy    . Lung lobectomy      right lower  . Colon surgery      chemo  . Kidney surgery  1999    right/chemo kidney removed for cancer  . Partial hysterectomy    . Breast biopsy      Bilateral cysts/benign  . Colectomy  05-31-2012    laparoscopic ,possible open  . Abdominal hysterectomy  80's  fibroids  . Tonsillectomy      as child  . Partial colectomy  05/31/2012    Procedure: PARTIAL COLECTOMY;  Surgeon: Rolm Bookbinder, MD;  Location: [facility];  Service: General;  Laterality: N/A;  . Breast biopsy Right 10/03/12    ER/PR +  . Breast lumpectomy with needle localization Right 12/20/2012    Procedure: BREAST LUMPECTOMY WITH NEEDLE LOCALIZATION;  Surgeon: Rolm Bookbinder, MD;  Location: [facility];  Service: General;  Laterality: Right;  . Breast biopsy Right 12/20/2012    Procedure: Right breast Excisional Biopsy;  Surgeon: Rolm Bookbinder, MD;  Location: [facility];  Service: General;  Laterality: Right;  . Re-excision of breast cancer,superior margins Right 01/21/2013    Procedure: RE-EXCISION OF BREAST CANCER,SUPERIOR MARGINS;  Surgeon: Rolm Bookbinder, MD;  Location: [facility];  Service: General;  Laterality: Right;    Current Outpatient Prescriptions  Medication Sig Dispense Refill  . ALPRAZolam (XANAX) 1 MG tablet Take 0.5 mg by mouth at  bedtime.     . Ascorbic Acid (VITAMIN C WITH ROSE HIPS) 500 MG tablet Take 500 mg by mouth every morning.     Marland Kitchen atorvastatin (LIPITOR) 20 MG tablet Take 20 mg by mouth daily before breakfast.     . Calcium Carbonate-Vitamin D (CALCIUM-VITAMIN D) 500-200 MG-UNIT per tablet Take 1 tablet by mouth 4 (four) times a week. Pt takes medication every other day.    . chlorhexidine (PERIDEX) 0.12 % solution Use as directed 5 mLs in the mouth or throat daily.   2  . exemestane (AROMASIN) 25 MG tablet Take 1 tablet (25 mg total) by mouth daily after breakfast. 90 tablet 1  . Ferrous Gluconate (IRON) 240 (27 FE) MG TABS Take 1 tablet by mouth every morning.     . hydrochlorothiazide 25 MG tablet Take 25 mg by mouth daily before breakfast.     . losartan (COZAAR) 50 MG tablet Take 50 mg by mouth daily.    . metFORMIN (GLUCOPHAGE) 500 MG tablet Take 500 mg by mouth daily with breakfast.     . metoprolol (LOPRESSOR) 100 MG tablet Take 100 mg by mouth daily before breakfast.     . Multiple Vitamin (MULTIVITAMIN WITH MINERALS) TABS Take 1 tablet by mouth every morning. She takes Dance movement psychotherapist Adult 50+.    Marland Kitchen omeprazole (PRILOSEC) 20 MG capsule Take 20 mg by mouth every morning.     Marland Kitchen PARoxetine (PAXIL) 40 MG tablet Take 40 mg by mouth daily before breakfast.     . traMADol (ULTRAM) 50 MG tablet Take 50 mg by mouth every 6 (six) hours as needed for pain.      No current facility-administered medications for this visit.    Family History  Problem Relation Age of Onset  . Colon polyps Brother   . Multiple myeloma Brother 69  . Breast cancer Mother     dx in her 54s  . Heart disease Father   . Esophageal cancer Neg Hx   . Rectal cancer Neg Hx   . Stomach cancer Neg Hx   . Colon cancer Paternal Aunt   . Breast cancer Maternal Grandmother   . Colon cancer Cousin     2 paternal first cousins  . Leukemia Cousin 11    ROS:  Pertinent items are noted in HPI.  Otherwise, a comprehensive ROS was  negative.  Exam:   BP 134/90 mmHg  Pulse 76  Resp 24  Ht 5' 5.5" (1.664 m)  Wt 258 lb 12.8  oz (117.391 kg)  BMI 42.40 kg/m2  LMP 07/26/1983      Height: 5' 5.5" (166.4 cm)  Ht Readings from Last 3 Encounters:  11/10/14 5' 5.5" (1.664 m)  10/08/14 5' 6"  (1.676 m)  09/24/14 5' 6"  (1.676 m)    General appearance: alert, cooperative and appears stated age Head: Normocephalic, without obvious abnormality, atraumatic Neck: no adenopathy, supple, symmetrical, trachea midline and thyroid normal to inspection and palpation Lungs: clear to auscultation bilaterally Breasts: normal appearance, no masses or tenderness, Inspection negative, No nipple retraction or dimpling, No nipple discharge or bleeding, No axillary or supraclavicular adenopathy on left breast.  Right  Breast with 7 cm hematoma at 9 - 12:00.  Liner horizontal scar medial to this. Scar of upper chest wall.  No nodes. No nipple discharge. Heart: regular rate and rhythm Abdomen: obese, vertical midline incision with weakness of fascia below umbilicus, soft, non-tender; bowel sounds normal; no masses,  no organomegaly Extremities: extremities normal, atraumatic, no cyanosis or edema Skin: Skin color, texture, turgor normal. No rashes or lesions Lymph nodes: Cervical, supraclavicular, and axillary nodes normal. No abnormal inguinal nodes palpated Neurologic: Grossly normal   Pelvic: External genitalia:  no lesions              Urethra:  normal appearing urethra with no masses, tenderness or lesions              Bartholins and Skenes: normal                 Vagina: normal appearing vagina with normal color and discharge, no lesions              Cervix: absent              Pap taken: No. Bimanual Exam:  Uterus:  uterus absent              Adnexa: normal adnexa, no mass, fullness, tenderness and Exam limited by body habitus.               Rectovaginal: Confirms               Anus:  normal sphincter tone, no lesions  Chaperone  was present for exam.  A:  Well Woman with normal exam Normal gynecologic exam. Atypical lobular hyperplasia. Right breast hematoma from recent needle biopsy.  Status post TAH for fibroids. Ovaries retained.  Multiple malignancies of right breast, colon, right kidney, and right lung. Morbid obesity.  P:   Mammogram per surgery and oncology team. pap smear not indicated.  Discussed Ca/Vit D/weight bearing exercise.  Return for pelvic ultrasound to check ovaries.   return annually or prn

## 2014-11-11 ENCOUNTER — Telehealth: Payer: Self-pay | Admitting: Obstetrics and Gynecology

## 2014-11-11 NOTE — Telephone Encounter (Signed)
Call to patient. Advised of benefit quote received for PUS. °Patient agreeable. Scheduled PUS. °Advised patient of 72 hour cancellation policy and $100 cancellation fee. Patient agreeable. °

## 2014-11-20 ENCOUNTER — Ambulatory Visit (INDEPENDENT_AMBULATORY_CARE_PROVIDER_SITE_OTHER): Payer: Medicare Other | Admitting: Obstetrics and Gynecology

## 2014-11-20 ENCOUNTER — Ambulatory Visit (INDEPENDENT_AMBULATORY_CARE_PROVIDER_SITE_OTHER): Payer: Medicare Other

## 2014-11-20 ENCOUNTER — Encounter: Payer: Self-pay | Admitting: Obstetrics and Gynecology

## 2014-11-20 VITALS — BP 130/88 | HR 66 | Ht 65.5 in | Wt 259.0 lb

## 2014-11-20 DIAGNOSIS — N952 Postmenopausal atrophic vaginitis: Secondary | ICD-10-CM | POA: Diagnosis not present

## 2014-11-20 DIAGNOSIS — Z853 Personal history of malignant neoplasm of breast: Secondary | ICD-10-CM | POA: Diagnosis not present

## 2014-11-20 DIAGNOSIS — E669 Obesity, unspecified: Secondary | ICD-10-CM | POA: Diagnosis not present

## 2014-11-20 NOTE — Progress Notes (Signed)
   Subjective  69 y.o. G44P1010  African American female here for pelvic ultrasound for evaluation of ovaries.   Status post TAH for fibroids. Ovaries retained. Recent pelvic examination limited by body habitus.  History of multiple cancers.   Vaginal bleeding noted after transvaginal U/S probe removed.  Usually does not have any vaginal bleeding.  Prior paps in past all normal.  History of right breast cancer.  Awaiting further right breast care for atypical hyperplasia on recent biopsy.   Had hematoma post biopsy.   Objective  Pelvic ultrasound images and report reviewed with patient.  Uterus - absent.  Vaginal cuff normal. EMS - NA. Ovaries - Normal. Free fluid - no.      Pelvic exam -   Blood stained perineum.  Vagina with atrophy changes.  Small amount of bleeding noted from various points of the vaginal mucosa.  No lacerations.  No masses.  Nontender.   Assessment  Normal bilateral ovaries. Vaginal atrophy.  Hx right breast cancer and current atypical hyperplasia of breast.   Plan  Reassurance give regarding anatomy.  Discussed vaginal hydration with water based products and with cooking oils if needed after the bleeding has ceased.   ___15____ minutes face to face time of which over 50% was spent in counseling.   After visit summary to patient.

## 2014-12-09 ENCOUNTER — Other Ambulatory Visit: Payer: Self-pay | Admitting: General Surgery

## 2014-12-09 DIAGNOSIS — N6099 Unspecified benign mammary dysplasia of unspecified breast: Secondary | ICD-10-CM | POA: Insufficient documentation

## 2014-12-29 ENCOUNTER — Other Ambulatory Visit: Payer: Self-pay | Admitting: General Surgery

## 2014-12-29 DIAGNOSIS — N6099 Unspecified benign mammary dysplasia of unspecified breast: Secondary | ICD-10-CM

## 2015-01-14 ENCOUNTER — Encounter (HOSPITAL_BASED_OUTPATIENT_CLINIC_OR_DEPARTMENT_OTHER): Payer: Self-pay

## 2015-01-15 ENCOUNTER — Encounter (HOSPITAL_BASED_OUTPATIENT_CLINIC_OR_DEPARTMENT_OTHER)
Admission: RE | Admit: 2015-01-15 | Discharge: 2015-01-15 | Disposition: A | Discharge status: Home / Self Care | Payer: Medicare Other | Source: Ambulatory Visit | Attending: General Surgery | Admitting: General Surgery

## 2015-01-15 ENCOUNTER — Ambulatory Visit
Admission: RE | Admit: 2015-01-15 | Discharge: 2015-01-15 | Disposition: A | Discharge status: Home / Self Care | Payer: Medicare Other | Source: Ambulatory Visit | Attending: General Surgery | Admitting: General Surgery

## 2015-01-15 DIAGNOSIS — D649 Anemia, unspecified: Secondary | ICD-10-CM | POA: Diagnosis not present

## 2015-01-15 DIAGNOSIS — K219 Gastro-esophageal reflux disease without esophagitis: Secondary | ICD-10-CM | POA: Diagnosis not present

## 2015-01-15 DIAGNOSIS — Z853 Personal history of malignant neoplasm of breast: Secondary | ICD-10-CM | POA: Diagnosis not present

## 2015-01-15 DIAGNOSIS — F419 Anxiety disorder, unspecified: Secondary | ICD-10-CM | POA: Diagnosis not present

## 2015-01-15 DIAGNOSIS — I10 Essential (primary) hypertension: Secondary | ICD-10-CM | POA: Diagnosis not present

## 2015-01-15 DIAGNOSIS — N6091 Unspecified benign mammary dysplasia of right breast: Secondary | ICD-10-CM | POA: Diagnosis not present

## 2015-01-15 DIAGNOSIS — Z85118 Personal history of other malignant neoplasm of bronchus and lung: Secondary | ICD-10-CM | POA: Diagnosis not present

## 2015-01-15 DIAGNOSIS — F329 Major depressive disorder, single episode, unspecified: Secondary | ICD-10-CM | POA: Diagnosis not present

## 2015-01-15 DIAGNOSIS — Z85038 Personal history of other malignant neoplasm of large intestine: Secondary | ICD-10-CM | POA: Diagnosis not present

## 2015-01-15 DIAGNOSIS — M1990 Unspecified osteoarthritis, unspecified site: Secondary | ICD-10-CM | POA: Diagnosis not present

## 2015-01-15 DIAGNOSIS — N6099 Unspecified benign mammary dysplasia of unspecified breast: Secondary | ICD-10-CM

## 2015-01-15 DIAGNOSIS — E119 Type 2 diabetes mellitus without complications: Secondary | ICD-10-CM | POA: Diagnosis not present

## 2015-01-15 DIAGNOSIS — Z87891 Personal history of nicotine dependence: Secondary | ICD-10-CM | POA: Diagnosis not present

## 2015-01-15 LAB — CBC WITH DIFFERENTIAL/PLATELET
BASOS ABS: 0 10*3/uL (ref 0.0–0.1)
Basophils Relative: 0 % (ref 0–1)
Eosinophils Absolute: 0.1 10*3/uL (ref 0.0–0.7)
Eosinophils Relative: 1 % (ref 0–5)
HCT: 39.8 % (ref 36.0–46.0)
HEMOGLOBIN: 13.2 g/dL (ref 12.0–15.0)
Lymphocytes Relative: 34 % (ref 12–46)
Lymphs Abs: 1.7 10*3/uL (ref 0.7–4.0)
MCH: 28.8 pg (ref 26.0–34.0)
MCHC: 33.2 g/dL (ref 30.0–36.0)
MCV: 86.9 fL (ref 78.0–100.0)
MONO ABS: 0.2 10*3/uL (ref 0.1–1.0)
Monocytes Relative: 4 % (ref 3–12)
NEUTROS PCT: 61 % (ref 43–77)
Neutro Abs: 3.1 10*3/uL (ref 1.7–7.7)
Platelets: 146 10*3/uL — ABNORMAL LOW (ref 150–400)
RBC: 4.58 MIL/uL (ref 3.87–5.11)
RDW: 14.5 % (ref 11.5–15.5)
WBC: 5.1 10*3/uL (ref 4.0–10.5)

## 2015-01-15 LAB — BASIC METABOLIC PANEL
Anion gap: 9 (ref 5–15)
BUN: 17 mg/dL (ref 6–20)
CHLORIDE: 101 mmol/L (ref 101–111)
CO2: 33 mmol/L — ABNORMAL HIGH (ref 22–32)
CREATININE: 0.94 mg/dL (ref 0.44–1.00)
Calcium: 9.8 mg/dL (ref 8.9–10.3)
GFR calc non Af Amer: >60 mL/min (ref >60)
Glucose, Bld: 120 mg/dL — ABNORMAL HIGH (ref 65–99)
POTASSIUM: 3.5 mmol/L (ref 3.5–5.1)
SODIUM: 143 mmol/L (ref 135–145)

## 2015-01-15 NOTE — Progress Notes (Signed)
Dr. Al Corpus examined Airway- ok for surgery

## 2015-01-19 ENCOUNTER — Encounter (HOSPITAL_BASED_OUTPATIENT_CLINIC_OR_DEPARTMENT_OTHER): Payer: Self-pay | Admitting: Certified Registered"

## 2015-01-19 ENCOUNTER — Encounter (HOSPITAL_BASED_OUTPATIENT_CLINIC_OR_DEPARTMENT_OTHER)
Admission: RE | Disposition: A | Discharge status: Home / Self Care | Payer: Self-pay | Source: Ambulatory Visit | Attending: General Surgery

## 2015-01-19 ENCOUNTER — Ambulatory Visit (HOSPITAL_BASED_OUTPATIENT_CLINIC_OR_DEPARTMENT_OTHER): Payer: Medicare Other | Admitting: Certified Registered"

## 2015-01-19 ENCOUNTER — Ambulatory Visit
Admission: RE | Admit: 2015-01-19 | Discharge: 2015-01-19 | Disposition: A | Discharge status: Home / Self Care | Payer: Medicare Other | Source: Ambulatory Visit | Attending: General Surgery | Admitting: General Surgery

## 2015-01-19 ENCOUNTER — Ambulatory Visit (HOSPITAL_BASED_OUTPATIENT_CLINIC_OR_DEPARTMENT_OTHER)
Admission: RE | Admit: 2015-01-19 | Discharge: 2015-01-19 | Disposition: A | Discharge status: Home / Self Care | Payer: Medicare Other | Source: Ambulatory Visit | Attending: General Surgery | Admitting: General Surgery

## 2015-01-19 DIAGNOSIS — F329 Major depressive disorder, single episode, unspecified: Secondary | ICD-10-CM | POA: Insufficient documentation

## 2015-01-19 DIAGNOSIS — E119 Type 2 diabetes mellitus without complications: Secondary | ICD-10-CM | POA: Insufficient documentation

## 2015-01-19 DIAGNOSIS — M1990 Unspecified osteoarthritis, unspecified site: Secondary | ICD-10-CM | POA: Insufficient documentation

## 2015-01-19 DIAGNOSIS — D649 Anemia, unspecified: Secondary | ICD-10-CM | POA: Insufficient documentation

## 2015-01-19 DIAGNOSIS — N6099 Unspecified benign mammary dysplasia of unspecified breast: Secondary | ICD-10-CM

## 2015-01-19 DIAGNOSIS — I10 Essential (primary) hypertension: Secondary | ICD-10-CM | POA: Diagnosis not present

## 2015-01-19 DIAGNOSIS — N6091 Unspecified benign mammary dysplasia of right breast: Secondary | ICD-10-CM | POA: Insufficient documentation

## 2015-01-19 DIAGNOSIS — Z853 Personal history of malignant neoplasm of breast: Secondary | ICD-10-CM | POA: Insufficient documentation

## 2015-01-19 DIAGNOSIS — F419 Anxiety disorder, unspecified: Secondary | ICD-10-CM | POA: Diagnosis not present

## 2015-01-19 DIAGNOSIS — Z87891 Personal history of nicotine dependence: Secondary | ICD-10-CM | POA: Insufficient documentation

## 2015-01-19 DIAGNOSIS — Z85118 Personal history of other malignant neoplasm of bronchus and lung: Secondary | ICD-10-CM | POA: Insufficient documentation

## 2015-01-19 DIAGNOSIS — Z85038 Personal history of other malignant neoplasm of large intestine: Secondary | ICD-10-CM | POA: Insufficient documentation

## 2015-01-19 DIAGNOSIS — K219 Gastro-esophageal reflux disease without esophagitis: Secondary | ICD-10-CM | POA: Insufficient documentation

## 2015-01-19 HISTORY — PX: BREAST LUMPECTOMY WITH RADIOACTIVE SEED LOCALIZATION: SHX6424

## 2015-01-19 HISTORY — DX: Disease of blood and blood-forming organs, unspecified: D75.9

## 2015-01-19 LAB — GLUCOSE, CAPILLARY
Glucose-Capillary: 148 mg/dL — ABNORMAL HIGH (ref 65–99)
Glucose-Capillary: 127 mg/dL — ABNORMAL HIGH (ref 65–99)

## 2015-01-19 SURGERY — BREAST LUMPECTOMY WITH RADIOACTIVE SEED LOCALIZATION
Anesthesia: General | Site: Breast | Laterality: Right

## 2015-01-19 MED ORDER — CEFAZOLIN SODIUM-DEXTROSE 2-3 GM-% IV SOLR
INTRAVENOUS | Status: AC
  Filled 2015-01-19: qty 50

## 2015-01-19 MED ORDER — GLYCOPYRROLATE 0.2 MG/ML IJ SOLN: 0.2000 mg | Freq: Once | INTRAMUSCULAR | Status: DC | PRN

## 2015-01-19 MED ORDER — LACTATED RINGERS IV SOLN
INTRAVENOUS | Status: DC
  Administered 2015-01-19 (×2): via INTRAVENOUS

## 2015-01-19 MED ORDER — PROPOFOL 10 MG/ML IV BOLUS
INTRAVENOUS | Status: DC | PRN
  Administered 2015-01-19: 150 mg via INTRAVENOUS

## 2015-01-19 MED ORDER — FENTANYL CITRATE (PF) 100 MCG/2ML IJ SOLN
50.0000 ug | INTRAMUSCULAR | Status: DC | PRN
  Administered 2015-01-19: 50 ug via INTRAVENOUS

## 2015-01-19 MED ORDER — SCOPOLAMINE 1 MG/3DAYS TD PT72: 1.0000 | MEDICATED_PATCH | Freq: Once | TRANSDERMAL | Status: DC | PRN

## 2015-01-19 MED ORDER — FENTANYL CITRATE (PF) 100 MCG/2ML IJ SOLN
INTRAMUSCULAR | Status: AC
Start: 2015-01-19 — End: 2015-01-19
  Filled 2015-01-19: qty 6

## 2015-01-19 MED ORDER — MIDAZOLAM HCL 2 MG/2ML IJ SOLN: 1.0000 mg | INTRAMUSCULAR | Status: DC | PRN

## 2015-01-19 MED ORDER — OXYCODONE HCL 5 MG PO TABS: 5.0000 mg | ORAL_TABLET | Freq: Four times a day (QID) | ORAL | Status: DC | PRN

## 2015-01-19 MED ORDER — EPHEDRINE SULFATE 50 MG/ML IJ SOLN
INTRAMUSCULAR | Status: DC | PRN
  Administered 2015-01-19: 10 mg via INTRAVENOUS

## 2015-01-19 MED ORDER — ONDANSETRON HCL 4 MG/2ML IJ SOLN
INTRAMUSCULAR | Status: DC | PRN
  Administered 2015-01-19: 4 mg via INTRAVENOUS

## 2015-01-19 MED ORDER — FENTANYL CITRATE (PF) 100 MCG/2ML IJ SOLN
25.0000 ug | INTRAMUSCULAR | Status: DC | PRN
  Administered 2015-01-19: 25 ug via INTRAVENOUS

## 2015-01-19 MED ORDER — PROMETHAZINE HCL 25 MG/ML IJ SOLN: 6.2500 mg | INTRAMUSCULAR | Status: DC | PRN

## 2015-01-19 MED ORDER — CEFAZOLIN SODIUM-DEXTROSE 2-3 GM-% IV SOLR
2.0000 g | INTRAVENOUS | Status: AC
  Administered 2015-01-19: 2 g via INTRAVENOUS

## 2015-01-19 MED ORDER — PROPOFOL 500 MG/50ML IV EMUL
INTRAVENOUS | Status: AC
  Filled 2015-01-19: qty 50

## 2015-01-19 MED ORDER — DEXAMETHASONE SODIUM PHOSPHATE 4 MG/ML IJ SOLN
INTRAMUSCULAR | Status: DC | PRN
  Administered 2015-01-19: 4 mg via INTRAVENOUS

## 2015-01-19 MED ORDER — BUPIVACAINE HCL (PF) 0.25 % IJ SOLN
INTRAMUSCULAR | Status: AC
Start: 2015-01-19 — End: 2015-01-19
  Filled 2015-01-19: qty 30

## 2015-01-19 MED ORDER — LIDOCAINE HCL (CARDIAC) 20 MG/ML IV SOLN
INTRAVENOUS | Status: DC | PRN
  Administered 2015-01-19: 60 mg via INTRAVENOUS

## 2015-01-19 MED ORDER — FENTANYL CITRATE (PF) 100 MCG/2ML IJ SOLN
INTRAMUSCULAR | Status: AC
  Filled 2015-01-19: qty 2

## 2015-01-19 MED ORDER — BUPIVACAINE HCL (PF) 0.25 % IJ SOLN
INTRAMUSCULAR | Status: DC | PRN
  Administered 2015-01-19: 10 mL

## 2015-01-19 SURGICAL SUPPLY — 55 items
APPLIER CLIP 9.375 MED OPEN (MISCELLANEOUS)
BENZOIN TINCTURE PRP APPL 2/3 (GAUZE/BANDAGES/DRESSINGS) ×3 IMPLANT
BINDER BREAST 3XL (BIND) ×3 IMPLANT
BINDER BREAST LRG (GAUZE/BANDAGES/DRESSINGS) IMPLANT
BINDER BREAST MEDIUM (GAUZE/BANDAGES/DRESSINGS) IMPLANT
BINDER BREAST XLRG (GAUZE/BANDAGES/DRESSINGS) IMPLANT
BINDER BREAST XXLRG (GAUZE/BANDAGES/DRESSINGS) IMPLANT
BLADE SURG 15 STRL LF DISP TIS (BLADE) ×1 IMPLANT
BLADE SURG 15 STRL SS (BLADE) ×2
CANISTER SUC SOCK COL 7IN (MISCELLANEOUS) IMPLANT
CANISTER SUCT 1200ML W/VALVE (MISCELLANEOUS) IMPLANT
CHLORAPREP W/TINT 26ML (MISCELLANEOUS) ×3 IMPLANT
CLIP APPLIE 9.375 MED OPEN (MISCELLANEOUS) IMPLANT
CLOSURE WOUND 1/2 X4 (GAUZE/BANDAGES/DRESSINGS) ×1
COVER BACK TABLE 60X90IN (DRAPES) ×3 IMPLANT
COVER MAYO STAND STRL (DRAPES) ×3 IMPLANT
COVER PROBE W GEL 5X96 (DRAPES) ×3 IMPLANT
DECANTER SPIKE VIAL GLASS SM (MISCELLANEOUS) IMPLANT
DEVICE DUBIN W/COMP PLATE 8390 (MISCELLANEOUS) ×3 IMPLANT
DRAPE LAPAROSCOPIC ABDOMINAL (DRAPES) ×3 IMPLANT
DRSG TEGADERM 4X4.75 (GAUZE/BANDAGES/DRESSINGS) IMPLANT
ELECT COATED BLADE 2.86 ST (ELECTRODE) ×3 IMPLANT
ELECT REM PT RETURN 9FT ADLT (ELECTROSURGICAL) ×3
ELECTRODE REM PT RTRN 9FT ADLT (ELECTROSURGICAL) ×1 IMPLANT
GLOVE BIO SURGEON STRL SZ7 (GLOVE) IMPLANT
GLOVE BIOGEL PI IND STRL 7.5 (GLOVE) ×1 IMPLANT
GLOVE BIOGEL PI INDICATOR 7.5 (GLOVE) ×2
GOWN STRL REUS W/ TWL LRG LVL3 (GOWN DISPOSABLE) ×2 IMPLANT
GOWN STRL REUS W/TWL LRG LVL3 (GOWN DISPOSABLE) ×4
KIT MARKER MARGIN INK (KITS) ×3 IMPLANT
LIQUID BAND (GAUZE/BANDAGES/DRESSINGS) ×3 IMPLANT
MARKER SKIN DUAL TIP RULER LAB (MISCELLANEOUS) ×3 IMPLANT
NEEDLE HYPO 25X1 1.5 SAFETY (NEEDLE) ×3 IMPLANT
NS IRRIG 1000ML POUR BTL (IV SOLUTION) IMPLANT
PACK BASIN DAY SURGERY FS (CUSTOM PROCEDURE TRAY) ×3 IMPLANT
PENCIL BUTTON HOLSTER BLD 10FT (ELECTRODE) ×3 IMPLANT
SLEEVE SCD COMPRESS KNEE MED (MISCELLANEOUS) ×3 IMPLANT
SPONGE GAUZE 4X4 12PLY STER LF (GAUZE/BANDAGES/DRESSINGS) IMPLANT
SPONGE LAP 4X18 X RAY DECT (DISPOSABLE) ×3 IMPLANT
STAPLER VISISTAT 35W (STAPLE) IMPLANT
STRIP CLOSURE SKIN 1/2X4 (GAUZE/BANDAGES/DRESSINGS) ×2 IMPLANT
SUT MNCRL AB 4-0 PS2 18 (SUTURE) ×3 IMPLANT
SUT MON AB 5-0 PS2 18 (SUTURE) IMPLANT
SUT SILK 2 0 SH (SUTURE) IMPLANT
SUT VIC AB 2-0 SH 27 (SUTURE) ×2
SUT VIC AB 2-0 SH 27XBRD (SUTURE) ×1 IMPLANT
SUT VIC AB 3-0 SH 27 (SUTURE) ×2
SUT VIC AB 3-0 SH 27X BRD (SUTURE) ×1 IMPLANT
SUT VIC AB 5-0 PS2 18 (SUTURE) IMPLANT
SYR CONTROL 10ML LL (SYRINGE) ×3 IMPLANT
TOWEL OR 17X24 6PK STRL BLUE (TOWEL DISPOSABLE) ×3 IMPLANT
TOWEL OR NON WOVEN STRL DISP B (DISPOSABLE) IMPLANT
TUBE CONNECTING 20'X1/4 (TUBING)
TUBE CONNECTING 20X1/4 (TUBING) IMPLANT
YANKAUER SUCT BULB TIP NO VENT (SUCTIONS) IMPLANT

## 2015-01-19 NOTE — Transfer of Care (Signed)
Immediate Anesthesia Transfer of Care Note  Patient: Amy [name]  Procedure(s) Performed: Procedure(s): RIGHT BREAST LUMPECTOMY WITH RADIOACTIVE SEED LOCALIZATION (Right)  Patient Location: PACU  Anesthesia Type:General  Level of Consciousness: awake, alert , oriented and patient cooperative  Airway & Oxygen Therapy: Patient Spontanous Breathing and Patient connected to face mask oxygen  Post-op Assessment: Report given to RN and Post -op Vital signs reviewed and stable  Post vital signs: Reviewed and stable  Last Vitals:  Filed Vitals:   01/19/15 0637  BP: 133/73  Pulse: 83  Temp: 36.9 C  Resp: 20    Complications: No apparent anesthesia complications

## 2015-01-19 NOTE — Discharge Instructions (Signed)
[facility] °Office Phone Number [phone] ° °POST OP INSTRUCTIONS ° °Always review your discharge instruction sheet given to you by the facility where your surgery was performed. ° °IF YOU HAVE DISABILITY OR FAMILY LEAVE FORMS, YOU MUST BRING THEM TO THE OFFICE FOR PROCESSING.  DO NOT GIVE THEM TO YOUR DOCTOR. ° °1. A prescription for pain medication may be given to you upon discharge.  Take your pain medication as prescribed, if needed.  If narcotic pain medicine is not needed, then you may take acetaminophen (Tylenol), naprosyn (Alleve) or ibuprofen (Advil) as needed. °2. Take your usually prescribed medications unless otherwise directed °3. If you need a refill on your pain medication, please contact your pharmacy.  They will contact our office to request authorization.  Prescriptions will not be filled after 5pm or on week-ends. °4. You should eat very light the first 24 hours after surgery, such as soup, crackers, pudding, etc.  Resume your normal diet the day after surgery. °5. Most patients will experience some swelling and bruising in the breast.  Ice packs and a good support bra will help.  Wear the breast binder provided or a sports bra for 72 hours day and night.  After that wear a sports bra during the day until you return to the office. Swelling and bruising can take several days to resolve.  °6. It is common to experience some constipation if taking pain medication after surgery.  Increasing fluid intake and taking a stool softener will usually help or prevent this problem from occurring.  A mild laxative (Milk of Magnesia or Miralax) should be taken according to package directions if there are no bowel movements after 48 hours. °7. Unless discharge instructions indicate otherwise, you may remove your bandages 48 hours after surgery and you may shower at that time.  You may have steri-strips (small skin tapes) in place directly over the incision.  These strips should be left on the  skin for 7-10 days and will come off on their own.  If your surgeon used skin glue on the incision, you may shower in 24 hours.  The glue will flake off over the next 2-3 weeks.  Any sutures or staples will be removed at the office during your follow-up visit. °8. ACTIVITIES:  You may resume regular daily activities (gradually increasing) beginning the next day.  Wearing a good support bra or sports bra minimizes pain and swelling.  You may have sexual intercourse when it is comfortable. °a. You may drive when you no longer are taking prescription pain medication, you can comfortably wear a seatbelt, and you can safely maneuver your car and apply brakes. °b. RETURN TO WORK:  ______________________________________________________________________________________ °9. You should see your doctor in the office for a follow-up appointment approximately two weeks after your surgery.  Your doctor’s nurse will typically make your follow-up appointment when she calls you with your pathology report.  Expect your pathology report 3-4 business days after your surgery.  You may call to check if you do not hear from us after three days. °10. OTHER INSTRUCTIONS: _______________________________________________________________________________________________ _____________________________________________________________________________________________________________________________________ °_____________________________________________________________________________________________________________________________________ °_____________________________________________________________________________________________________________________________________ ° °WHEN TO CALL DR WAKEFIELD: °1. Fever over 101.0 °2. Nausea and/or vomiting. °3. Extreme swelling or bruising. °4. Continued bleeding from incision. °5. Increased pain, redness, or drainage from the incision. ° °The clinic staff is available to answer your questions during regular  business hours.  Please don’t hesitate to call and ask to speak to one of the nurses for clinical concerns.  If   you have a medical emergency, go to the nearest emergency room or call 911.  A surgeon from [facility] is always on call at the hospital. ° °For further questions, please visit [email or web address] ° ° ° °Post Anesthesia Home Care Instructions ° °Activity: °Get plenty of rest for the remainder of the day. A responsible adult should stay with you for 24 hours following the procedure.  °For the next 24 hours, DO NOT: °-Drive a car °-Operate machinery °-Drink alcoholic beverages °-Take any medication unless instructed by your physician °-Make any legal decisions or sign important papers. ° °Meals: °Start with liquid foods such as gelatin or soup. Progress to regular foods as tolerated. Avoid greasy, spicy, heavy foods. If nausea and/or vomiting occur, drink only clear liquids until the nausea and/or vomiting subsides. Call your physician if vomiting continues. ° °Special Instructions/Symptoms: °Your throat may feel dry or sore from the anesthesia or the breathing tube placed in your throat during surgery. If this causes discomfort, gargle with warm salt water. The discomfort should disappear within 24 hours. ° °If you had a scopolamine patch placed behind your ear for the management of post- operative nausea and/or vomiting: ° °1. The medication in the patch is effective for 72 hours, after which it should be removed.  Wrap patch in a tissue and discard in the trash. Wash hands thoroughly with soap and water. °2. You may remove the patch earlier than 72 hours if you experience unpleasant side effects which may include dry mouth, dizziness or visual disturbances. °3. Avoid touching the patch. Wash your hands with soap and water after contact with the patch. °  ° °

## 2015-01-19 NOTE — Op Note (Signed)
Preoperative diagnosis: Right breast ALH, history right breast lumpectomy Postoperative diagnosis: Same as above Procedure: Right breast radioactive seed guided excisional biopsy Surgeon: Dr. Serita Grammes Anesthesia: Gen. Specimens: Right breast tissue marked with paint EBL: minimal Complications: None Drains: None Disposition to recovery in stable condition Sponge and needle count correct at completion  Indications: This a 79 yof I know well from multiple surgeries including treatment for right breast cancer. She had mm abnormality in follow up that is ALH.  We discussed all of her options and decided to proceed with a radioactive seed guided excision of this area. She had a radioactive seed placed prior to beginning.   Procedure: After informed consent was obtained the patient was taken to the operating room.She was given antibiotics. Sequential compression devices were on her legs. She was then placed under general anesthesia without complication. Her right breast was prepped and draped in the standard sterile surgical fashion. A surgical time out was then performed.  I located the radioactive seed in the breast with the neoprobe. I used her old incision. I infiltrated Marcaine. I then removed the radioactive seed and the surrounding tissue. This was then passed off the table. Hemostasis was observed. The radioactive seed was confirmed to be removed by taking a mammogram. Mammogram of the specimen showed clip and seed removed. There were also clips from my prior lumpectomy present. This was confirmed by radiology. I then closed with 2-0 Vicryl, 3-0 Vicryl, 4-0 Monocryl, glue and Steri-Strips. She tolerated this well. A breast binder was placed. She was transferred to recovery in stable condition.

## 2015-01-19 NOTE — Interval H&P Note (Signed)
History and Physical Interval Note:  01/19/2015 7:25 AM  Marcene Corning  has presented today for surgery, with the diagnosis of RIGHT BREAST ATYPICAL LOBULAR HYPERPLASIA  The various methods of treatment have been discussed with the patient and family. After consideration of risks, benefits and other options for treatment, the patient has consented to  Procedure(s): RIGHT BREAST LUMPECTOMY WITH RADIOACTIVE SEED LOCALIZATION (Right) as a surgical intervention .  The patient's history has been reviewed, patient examined, no change in status, stable for surgery.  I have reviewed the patient's chart and labs.  Questions were answered to the patient's satisfaction.     Leronda Lewers

## 2015-01-19 NOTE — Anesthesia Procedure Notes (Signed)
Procedure Name: LMA Insertion Date/Time: 01/19/2015 7:36 AM Performed by: Yaron Grasse D Pre-anesthesia Checklist: Patient identified, Emergency Drugs available, Suction available and Patient being monitored Patient Re-evaluated:Patient Re-evaluated prior to inductionOxygen Delivery Method: Circle System Utilized Preoxygenation: Pre-oxygenation with 100% oxygen Intubation Type: IV induction Ventilation: Mask ventilation without difficulty LMA: LMA inserted LMA Size: 4.0 Number of attempts: 1 Airway Equipment and Method: Bite block Placement Confirmation: positive ETCO2 Tube secured with: Tape Dental Injury: Teeth and Oropharynx as per pre-operative assessment

## 2015-01-19 NOTE — H&P (Signed)
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 She is overall doing well except for some increasing sob. She underwent mm that shows a oval mass measuring 9 mm in lumpectomy site. US shows at 11 oclock 3 cm from the nac a 0.7x0.4x0.8 cm mass and there is an additional mass that is less than 1 cm. biopsy were performed showing ALH and a FA. she was referred down for consideration of excision of the alh.   Other Problems  Arthritis Back Pain Breast Cancer Cancer Colon Cancer Depression Diabetes Mellitus Heart murmur Hemorrhoids High blood pressure Lump In Breast Lung Cancer Umbilical Hernia Repair  Past Surgical History  Breast Biopsy Right. Breast Mass; Local Excision Right. Colon Polyp Removal - Colonoscopy Colon Removal - Partial Hysterectomy (not due to cancer) - Partial Lung Surgery Right. Nephrectomy Right. Tonsillectomy  Diagnostic Studies History Colonoscopy within last year Mammogram within last year Pap Smear 1-5 years ago  Allergies Iodine (Antiseptic) *ANTISEPTICS & DISINFECTANTS* Iohexol *DIAGNOSTIC PRODUCTS* Lactose *PHARMACEUTICAL ADJUVANTS*   Medication History ALPRAZolam ('1MG'$  Tablet, Oral) Active. Ascorbic Acid ('1000MG'$  Tablet, Oral) Active. Atorvastatin Calcium ('20MG'$  Tablet, Oral) Active. Exemestane ('25MG'$  Tablet, Oral) Active. Ferrous Gluconate (324 (38 Fe)MG Tablet, Oral) Active. Losartan Potassium ('50MG'$  Tablet, Oral) Active. MetFORMIN HCl ('500MG'$  Tablet, Oral) Active. Omeprazole ('40MG'$  Capsule DR, Oral) Active. Paxil ('40MG'$  Tablet, Oral) Active. TraMADol HCl ('50MG'$  Tablet, Oral) Active. Medications Reconciled  Social History  Alcohol use Remotely quit alcohol use. No caffeine use No drug use Tobacco use Former smoker.  Family History Arthritis Brother, Father, Mother,  Son. Breast Cancer Mother. Colon Cancer Mother. Depression Brother, Mother. Heart Disease Father, Mother. Heart disease in female family member before age 79 Hypertension Brother, Father, Mother. Melanoma Brother. Respiratory Condition Mother.   Review of Systems  General Present- Night Sweats. Not Present- Appetite Loss, Chills, Fatigue, Fever, Weight Gain and Weight Loss. Skin Present- Dryness. Not Present- Change in Wart/Mole, Hives, Jaundice, New Lesions, Non-Healing Wounds, Rash and Ulcer. HEENT Present- Ringing in the Ears, Seasonal Allergies and Wears glasses/contact lenses. Not Present- Earache, Hearing Loss, Hoarseness, Nose Bleed, Oral Ulcers, Sinus Pain, Sore Throat, Visual Disturbances and Yellow Eyes. Respiratory Present- Snoring. Not Present- Bloody sputum, Chronic Cough, Difficulty Breathing and Wheezing. Breast Present- Breast Pain. Not Present- Breast Mass, Nipple Discharge and Skin Changes. Cardiovascular Present- Shortness of Breath. Not Present- Chest Pain, Difficulty Breathing Lying Down, Leg Cramps, Palpitations, Rapid Heart Rate and Swelling of Extremities. Gastrointestinal Not Present- Abdominal Pain, Bloating, Bloody Stool, Change in Bowel Habits, Chronic diarrhea, Constipation, Difficulty Swallowing, Excessive gas, Gets full quickly at meals, Hemorrhoids, Indigestion, Nausea, Rectal Pain and Vomiting. Female Genitourinary Present- Nocturia. Not Present- Frequency, Painful Urination, Pelvic Pain and Urgency. Musculoskeletal Present- Joint Stiffness and Muscle Pain. Not Present- Back Pain, Joint Pain, Muscle Weakness and Swelling of Extremities. Neurological Present- Tingling. Not Present- Decreased Memory, Fainting, Headaches, Numbness, Seizures, Tremor, Trouble walking and Weakness. Psychiatric Present- Change in Sleep Pattern and Depression. Not Present- Anxiety, Bipolar, Fearful and Frequent crying. Endocrine Present- Hot flashes. Not Present- Cold  Intolerance, Excessive Hunger, Hair Changes, Heat Intolerance and New Diabetes. Hematology Present- Easy Bruising. Not Present- Excessive bleeding, Gland problems, HIV and Persistent Infections.   Vitals  Weight: 254.6 lb Height: 66in Body Surface Area: 2.32 m Body Mass Index: 41.09 kg/m Temp.: 97.11F(Oral)  Pulse: 86 (Regular)  Resp.: 16 (Unlabored)  BP: 130/60 (Sitting, Left Arm, Standard)  Physical Exam  General Mental Status-Alert. Orientation-Oriented X3 Chest and Lung Exam Note: occ exp wheezes Breast Nipples-No Discharge. Breast - Right-Lumpectomy scar. Breast Lump-No Palpable Breast Mass. Note: large right breast hematoma Cardiovascular Cardiovascular examination reveals -normal heart sounds, regular rate and rhythm with no murmurs. Lymphatic Head & Neck General Head & Neck Lymphatics: Bilateral - Description - Normal. Axillary General Axillary Region: Bilateral - Description - Normal. Note: no [place] adenopathy  Assessment & Plan ATYPICAL LOBULAR HYPERPLASIA OF RIGHT BREAST (610.8  N60.91) Right breast seed guided lumpectomy

## 2015-01-19 NOTE — Anesthesia Preprocedure Evaluation (Addendum)
Anesthesia Evaluation  Patient identified by MRN, date of birth, ID band Patient awake    Reviewed: Allergy & Precautions, NPO status   Airway Mallampati: II  TM Distance: >3 FB Neck ROM: Full    Dental   Pulmonary shortness of breath, former smoker,  breath sounds clear to auscultation        Cardiovascular hypertension, Pt. on home beta blockers Rhythm:Regular Rate:Normal     Neuro/Psych PSYCHIATRIC DISORDERS Anxiety Depression    GI/Hepatic GERD-  Medicated,  Endo/Other  diabetes, Type 2, Oral Hypoglycemic Agents  Renal/GU Renal diseaseNephrectomy 1999     Musculoskeletal  (+) Arthritis -,   Abdominal   Peds  Hematology  (+) anemia ,   Anesthesia Other Findings   Reproductive/Obstetrics                            Anesthesia Physical Anesthesia Plan  ASA: III  Anesthesia Plan: General   Post-op Pain Management:    Induction: Intravenous  Airway Management Planned: LMA  Additional Equipment:   Intra-op Plan:   Post-operative Plan: Extubation in OR  Informed Consent: I have reviewed the patients History and Physical, chart, labs and discussed the procedure including the risks, benefits and alternatives for the proposed anesthesia with the patient or authorized representative who has indicated his/her understanding and acceptance.   Dental advisory given  Plan Discussed with: CRNA and Anesthesiologist  Anesthesia Plan Comments:         Anesthesia Quick Evaluation

## 2015-01-19 NOTE — Anesthesia Postprocedure Evaluation (Signed)
  Anesthesia Post-op Note  Patient: Amy [name]  Procedure(s) Performed: Procedure(s): RIGHT BREAST LUMPECTOMY WITH RADIOACTIVE SEED LOCALIZATION (Right)  Patient Location: PACU  Anesthesia Type:General  Level of Consciousness: awake  Airway and Oxygen Therapy: Patient Spontanous Breathing  Post-op Pain: mild  Post-op Assessment: Post-op Vital signs reviewed              Post-op Vital Signs: Reviewed  Last Vitals:  Filed Vitals:   01/19/15 0945  BP: 142/77  Pulse: 86  Temp: 36.7 C  Resp: 20    Complications: No apparent anesthesia complications

## 2015-01-20 ENCOUNTER — Encounter (HOSPITAL_BASED_OUTPATIENT_CLINIC_OR_DEPARTMENT_OTHER): Payer: Self-pay | Admitting: General Surgery

## 2015-03-03 ENCOUNTER — Telehealth: Payer: Self-pay | Admitting: Hematology and Oncology

## 2015-03-03 ENCOUNTER — Ambulatory Visit (HOSPITAL_BASED_OUTPATIENT_CLINIC_OR_DEPARTMENT_OTHER): Payer: Medicare Other | Admitting: Hematology and Oncology

## 2015-03-03 ENCOUNTER — Encounter: Payer: Self-pay | Admitting: Hematology and Oncology

## 2015-03-03 VITALS — BP 142/79 | HR 73 | Temp 99.0°F | Resp 18 | Ht 65.0 in | Wt 258.3 lb

## 2015-03-03 DIAGNOSIS — D0511 Intraductal carcinoma in situ of right breast: Secondary | ICD-10-CM

## 2015-03-03 DIAGNOSIS — C3491 Malignant neoplasm of unspecified part of right bronchus or lung: Secondary | ICD-10-CM

## 2015-03-03 DIAGNOSIS — Z85528 Personal history of other malignant neoplasm of kidney: Secondary | ICD-10-CM

## 2015-03-03 DIAGNOSIS — Z79811 Long term (current) use of aromatase inhibitors: Secondary | ICD-10-CM

## 2015-03-03 DIAGNOSIS — Z170 Estrogen receptor positive status [ER+]: Secondary | ICD-10-CM

## 2015-03-03 DIAGNOSIS — C50911 Malignant neoplasm of unspecified site of right female breast: Secondary | ICD-10-CM

## 2015-03-03 DIAGNOSIS — C189 Malignant neoplasm of colon, unspecified: Secondary | ICD-10-CM

## 2015-03-03 NOTE — Progress Notes (Signed)
Patient Care Team: Seward Carol, MD as PCP - General (Internal Medicine)  DIAGNOSIS: No matching staging information was found for the patient.  SUMMARY OF ONCOLOGIC HISTORY:   Non-small cell cancer of right lung   06/02/2000 Surgery T1 N0 non-small cell lung cancer removed from the right lung with a VATS procedure by Dr. 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 She's been on Aromasin therapy and appears to be tolerating it fairly well. She does complain of musculoskeletal aches and pains. She also has hot flashes. But she is able to manage with over-the-counter pain pills intermittently.  REVIEW OF SYSTEMS:   Constitutional: Denies fevers, chills or abnormal weight loss Eyes: Denies blurriness of vision Ears, nose, mouth, throat, and face: Denies mucositis or sore throat Respiratory: Shortness of breath to minimal exertion Cardiovascular: Denies palpitation, chest discomfort or lower extremity swelling Gastrointestinal:  Denies nausea, heartburn or change in bowel habits Skin: Denies abnormal skin rashes Lymphatics: Denies new lymphadenopathy or easy bruising Neurological:Denies numbness, tingling or new weaknesses Behavioral/Psych: Mood is stable, no new  changes  Breast:  denies any pain or lumps or nodules in either breasts All other systems were reviewed with the patient and are negative.  I have reviewed the past medical history, past surgical history, social history and family history with the patient and they are unchanged from previous note.  ALLERGIES:  is allergic to crab; iodine; iohexol; lactose intolerance (gi); adhesive; and latex.  MEDICATIONS:  Current Outpatient Prescriptions  Medication Sig Dispense Refill  . ALPRAZolam (XANAX) 1 MG tablet Take 0.5 mg by mouth at bedtime.     . Ascorbic Acid (VITAMIN C WITH ROSE HIPS) 500 MG tablet Take 500 mg by mouth every morning.     Marland Kitchen atorvastatin (LIPITOR) 20 MG tablet Take 20 mg by mouth daily before breakfast.     . Calcium Carbonate-Vitamin D (CALCIUM-VITAMIN D) 500-200 MG-UNIT per tablet Take 1 tablet by mouth 4 (four) times a week. Pt takes medication every other day.    . chlorhexidine (PERIDEX) 0.12 % solution Use as directed 5 mLs in the mouth or throat daily.   2  . exemestane (AROMASIN) 25 MG tablet Take 1 tablet (25 mg total) by mouth daily after breakfast. 90 tablet 1  . Ferrous Gluconate (IRON) 240 (27 FE) MG TABS Take 1 tablet by mouth every morning.     . hydrochlorothiazide 25 MG tablet Take 25 mg by mouth daily before breakfast.     . losartan (COZAAR) 50 MG tablet Take 50 mg by mouth daily.    . metFORMIN (GLUCOPHAGE) 500 MG tablet Take 500 mg by mouth daily with breakfast.     . metoprolol (LOPRESSOR) 100 MG tablet Take 100 mg by mouth daily before breakfast.     .  Multiple Vitamin (MULTIVITAMIN WITH MINERALS) TABS Take 1 tablet by mouth every morning. She takes Dance movement psychotherapist Adult 50+.    Marland Kitchen omeprazole (PRILOSEC) 20 MG capsule Take 20 mg by mouth every morning.     Marland Kitchen oxyCODONE (OXY IR/ROXICODONE) 5 MG immediate release tablet Take 1 tablet (5 mg total) by mouth every 6 (six) hours as needed for moderate pain, severe pain or breakthrough pain. 10 tablet 0  .  PARoxetine (PAXIL) 40 MG tablet Take 40 mg by mouth daily before breakfast.     . traMADol (ULTRAM) 50 MG tablet Take 50 mg by mouth every 6 (six) hours as needed for pain.      No current facility-administered medications for this visit.    PHYSICAL EXAMINATION: ECOG PERFORMANCE STATUS: 1 - Symptomatic but completely ambulatory  Filed Vitals:   03/03/15 1404  BP: 142/79  Pulse: 73  Temp: 99 F (37.2 C)  Resp: 18   Filed Weights   03/03/15 1404  Weight: 258 lb 4.8 oz (117.164 kg)    GENERAL:alert, no distress and comfortable SKIN: skin color, texture, turgor are normal, no rashes or significant lesions EYES: normal, Conjunctiva are pink and non-injected, sclera clear OROPHARYNX:no exudate, no erythema and lips, buccal mucosa, and tongue normal  NECK: supple, thyroid normal size, non-tender, without nodularity LYMPH:  no palpable lymphadenopathy in the cervical, axillary or inguinal LUNGS: Diminished breath sounds throughout her lungs HEART: regular rate & rhythm and no murmurs and no lower extremity edema ABDOMEN:abdomen soft, non-tender and normal bowel sounds Musculoskeletal:no cyanosis of digits and no clubbing  NEURO: alert & oriented x 3 with fluent speech, no focal motor/sensory deficits  LABORATORY DATA:  I have reviewed the data as listed   Chemistry      Component Value Date/Time   NA 143 01/15/2015 1420   NA 144 09/02/2014 1412   K 3.5 01/15/2015 1420   K 3.4* 09/02/2014 1412   CL 101 01/15/2015 1420   CL 100 12/27/2012 1418   CO2 33* 01/15/2015 1420   CO2 31* 09/02/2014 1412   BUN 17 01/15/2015 1420   BUN 20.6 09/02/2014 1412   CREATININE 0.94 01/15/2015 1420   CREATININE 1.1 09/02/2014 1412      Component Value Date/Time   CALCIUM 9.8 01/15/2015 1420   CALCIUM 9.8 09/02/2014 1412   ALKPHOS 90 09/02/2014 1412   ALKPHOS 67 04/02/2012 1147   AST 18 09/02/2014 1412   AST 22 04/02/2012 1147   ALT 20 09/02/2014 1412   ALT 19 04/02/2012 1147   BILITOT  0.66 09/02/2014 1412   BILITOT 0.9 04/02/2012 1147       Lab Results  Component Value Date   WBC 5.1 01/15/2015   HGB 13.2 01/15/2015   HCT 39.8 01/15/2015   MCV 86.9 01/15/2015   PLT 146* 01/15/2015   NEUTROABS 3.1 01/15/2015   ASSESSMENT & PLAN:  Breast cancer, right breast 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 She underwent re-excision on 01/21/13. Pathology revealed no residual carcinoma or atypia. Did not require radiation currently on hormonal therapy with Aromasin 25 mg daily from 04/02/2013  Aromasin toxicities: No major side effects from Aromasin other than occasional hot flashes and joint pains Breast cancer surveillance: 1. Breast exam done 09/02/2014 is normal 2. Mammograms March 2016: Revealed oval well-circumscribed masses in the right breast measuring 0.8 cm and 0.7 cm, lumpectomy 01/19/2015: Benign breast tissue  Shortness of breath to minimal exertion: I suspect that  this may be related to underlying COPD.  Continue with current treatment with Aromasin and continue with the surveillance plan. Return to clinic in 1 year for follow-up   No orders of the defined types were placed in this encounter.   The patient has a good understanding of the overall plan. she agrees with it. she will call with any problems that may develop before the next visit here.   Rulon Eisenmenger, MD

## 2015-03-03 NOTE — Assessment & Plan Note (Signed)
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 She underwent re-excision on 01/21/13. Pathology revealed no residual carcinoma or atypia. Did not require radiation currently on hormonal therapy with Aromasin 25 mg daily from 04/02/2013  Aromasin toxicities: No major side effects from Aromasin other than occasional hot flashes and joint pains Breast cancer surveillance: 1. Breast exam done 09/02/2014 is normal 2. Mammograms March 2016: Revealed oval well-circumscribed masses in the right breast measuring 0.8 cm and 0.7 cm, lumpectomy 01/19/2015: Benign breast tissue  Continue with current treatment with Aromasin and continue with the surveillance plan. Return to clinic in 6 months for follow-up

## 2015-03-03 NOTE — Telephone Encounter (Signed)
Gave avs & calendar for August 2017

## 2015-04-21 ENCOUNTER — Other Ambulatory Visit: Payer: Self-pay | Admitting: *Deleted

## 2015-04-21 DIAGNOSIS — C50911 Malignant neoplasm of unspecified site of right female breast: Secondary | ICD-10-CM

## 2015-04-21 MED ORDER — EXEMESTANE 25 MG PO TABS: 25.0000 mg | ORAL_TABLET | Freq: Every day | ORAL | Status: DC

## 2015-04-30 ENCOUNTER — Encounter: Payer: Self-pay | Admitting: Hematology and Oncology

## 2015-04-30 NOTE — Progress Notes (Signed)
Pt called on 04/29/15 in reference to being out of Aromasin. Pt states she took her last pill and needed help getting RX filled. Applied through [employer] online for patient while asking pt the questions to submit via telephone. Pt was approved for copay assistance through [employer] for $5,000 eff 04/29/2015. Sending award letter and POE to billing.

## 2015-09-30 ENCOUNTER — Encounter: Payer: Self-pay | Admitting: Gastroenterology

## 2015-10-20 MED FILL — EXEMESTANE 25 MG TABLET: 25 | 90 days supply | Qty: 90 | Fill #2

## 2015-10-27 ENCOUNTER — Ambulatory Visit (INDEPENDENT_AMBULATORY_CARE_PROVIDER_SITE_OTHER): Payer: Medicare Other | Admitting: Internal Medicine

## 2015-10-27 ENCOUNTER — Encounter: Payer: Self-pay | Admitting: Internal Medicine

## 2015-10-27 VITALS — BP 142/88 | HR 79 | Ht 65.0 in | Wt 258.0 lb

## 2015-10-27 DIAGNOSIS — J438 Other emphysema: Secondary | ICD-10-CM | POA: Insufficient documentation

## 2015-10-27 DIAGNOSIS — J439 Emphysema, unspecified: Secondary | ICD-10-CM

## 2015-10-27 DIAGNOSIS — R053 Chronic cough: Secondary | ICD-10-CM | POA: Insufficient documentation

## 2015-10-27 DIAGNOSIS — R0602 Shortness of breath: Secondary | ICD-10-CM | POA: Diagnosis not present

## 2015-10-27 DIAGNOSIS — I272 Other secondary pulmonary hypertension: Secondary | ICD-10-CM

## 2015-10-27 DIAGNOSIS — Z720 Tobacco use: Secondary | ICD-10-CM

## 2015-10-27 DIAGNOSIS — R05 Cough: Secondary | ICD-10-CM | POA: Diagnosis not present

## 2015-10-27 DIAGNOSIS — Z87891 Personal history of nicotine dependence: Secondary | ICD-10-CM

## 2015-10-27 MED ORDER — UMECLIDINIUM BROMIDE 62.5 MCG/INH IN AEPB: 1.0000 | INHALATION_SPRAY | Freq: Every day | RESPIRATORY_TRACT | Status: DC

## 2015-10-27 NOTE — Progress Notes (Signed)
Subjective:    Patient ID: Amy [name], female    DOB: 01/14/1946, 70 y.o.   MRN: [id number] PCP Kandice Hams, MD  HPI  IOV 10/27/2015  Chief Complaint  Patient presents with  . Pulmonary Consult    Pt self referral for DOE x 1 year. Pt c/o non prod cough with chest congestion x 1 week.     70 year-old morbidly obese female, previous history of 26 pack smoking quit 2 years ago. Also multiple cancer history and cancer survivor was reportedly in complete remission. Last CT scan of the chest in 2013 without any evidence of cancer in the lung. Followed by primary care physician. She reports insidious onset of shortness of breath for the last 1 year. Progressive only somewhat. Overall stable. Moderate in intensity. Ranging clothes walking one flight of stairs or doing groceries brings on her dyspnea. It is relieved by rest. For the last 2 weeks it is associated with cough which is dry but she does not feel sick. There is no fever or hemoptysis or orthopnea paroxysmal nocturnal dyspnea. She is morbidly obese with a BMI greater than 40 but it is unchanged. There is a history of mild sleep apnea on a sleep study per her history 10 years ago.  Walking desaturation test 180 5. 3 laps on room air with a full head probe: She walk only 30 feet and she desaturated to 85%. We walked her final feet and she desaturated to 81%. She improved a pulse ox greater than 92% at rest. She declined any November the oxygen therapy.   Ct chest oct 2013 - personally visualized IMPRESSION:  1. Surgical changes on the right. No evidence of recurrent or metastatic disease. 2. Mild centrilobular emphysema. 3. Pulmonary artery enlargement suggests pulmonary arterial hypertension. 4. Esophageal air fluid level suggests dysmotility or gastroesophageal reflux.   Original Report Authenticated By: Areta Haber, M.D.     has a past medical history of Depression; Diabetes mellitus; Anemia; Anxiety;  Arthritis; Hernia; Hemorrhoids; Heart murmur; Cancer ([place]); Lung cancer ([place]) (2001); Colon cancer ([place]) (1999, 2013); Breast cancer, right breast ([place]) (10/03/12); Hypertension; Allergy; Blood transfusion without reported diagnosis (1999); Cataract; GERD (gastroesophageal reflux disease); Chronic kidney disease (1999); Neuromuscular disorder ([place]); Ulcer; Shortness of breath; Hot flashes; breast cancer (2014); H/O: lung cancer; History of kidney cancer; History of colon cancer; and Blood dyscrasia.   reports that she quit smoking about 23 months ago. Her smoking use included Cigarettes. She started smoking about 23 years ago. She has a 26.25 pack-year smoking history. She has never used smokeless tobacco.  Past Surgical History  Procedure Laterality Date  . Colonoscopy    . Polypectomy    . Lung lobectomy      right lower  . Colon surgery      chemo  . Kidney surgery  1999    right/chemo kidney removed for cancer  . Partial hysterectomy    . Breast biopsy      Bilateral cysts/benign  . Colectomy  05-31-2012    laparoscopic ,possible open  . Abdominal hysterectomy  80's    fibroids  . Tonsillectomy      as child  . Partial colectomy  05/31/2012    Procedure: PARTIAL COLECTOMY;  Surgeon: Rolm Bookbinder, MD;  Location: [facility];  Service: General;  Laterality: N/A;  . Breast biopsy Right 10/03/12    ER/PR +  . Breast lumpectomy with needle localization Right 12/20/2012    Procedure: BREAST LUMPECTOMY WITH NEEDLE  LOCALIZATION;  Surgeon: Rolm Bookbinder, MD;  Location: [facility];  Service: General;  Laterality: Right;  . Breast biopsy Right 12/20/2012    Procedure: Right breast Excisional Biopsy;  Surgeon: Rolm Bookbinder, MD;  Location: [facility];  Service: General;  Laterality: Right;  . Re-excision of breast cancer,superior margins Right 01/21/2013    Procedure: RE-EXCISION OF BREAST CANCER,SUPERIOR MARGINS;  Surgeon: Rolm Bookbinder, MD;  Location: [facility];  Service: General;  Laterality:  Right;  . Tubal ligation    . Breast lumpectomy with radioactive seed localization Right 01/19/2015    Procedure: RIGHT BREAST LUMPECTOMY WITH RADIOACTIVE SEED LOCALIZATION;  Surgeon: Rolm Bookbinder, MD;  Location: [facility];  Service: General;  Laterality: Right;    Allergies  Allergen Reactions  . Crab [Shellfish Allergy] Anaphylaxis  . Iodine Anaphylaxis  . Iohexol      Code: HIVES, Desc: pt states , swelling mouth , throat, and has difficulty breathing   . Lactose Intolerance (Gi)   . Adhesive [Tape] Rash  . Latex Itching and Rash    Immunization History  Administered Date(s) Administered  . Influenza,inj,Quad PF,36+ Mos 08/26/2015  . Pneumococcal Conjugate-13 08/26/2015    Family History  Problem Relation Age of Onset  . Colon polyps Brother   . Multiple myeloma Brother 75  . Breast cancer Mother     dx in her 79s  . Heart disease Father   . Esophageal cancer Neg Hx   . Rectal cancer Neg Hx   . Stomach cancer Neg Hx   . Colon cancer Paternal Aunt   . Breast cancer Maternal Grandmother   . Colon cancer Cousin     2 paternal first cousins  . Leukemia Cousin 11     Current outpatient prescriptions:  .  ALPRAZolam (XANAX) 1 MG tablet, Take 0.5 mg by mouth at bedtime. , Disp: , Rfl:  .  Ascorbic Acid (VITAMIN C WITH ROSE HIPS) 500 MG tablet, Take 500 mg by mouth every morning. , Disp: , Rfl:  .  atorvastatin (LIPITOR) 20 MG tablet, Take 20 mg by mouth daily before breakfast. , Disp: , Rfl:  .  Calcium Carbonate-Vitamin D (CALCIUM-VITAMIN D) 500-200 MG-UNIT per tablet, Take 1 tablet by mouth 4 (four) times a week. Pt takes medication every other day., Disp: , Rfl:  .  exemestane (AROMASIN) 25 MG tablet, Take 1 tablet (25 mg total) by mouth daily after breakfast., Disp: 90 tablet, Rfl: 3 .  Ferrous Gluconate (IRON) 240 (27 FE) MG TABS, Take 1 tablet by mouth every morning. , Disp: , Rfl:  .  hydrochlorothiazide 25 MG tablet, Take 25 mg by mouth daily  before breakfast. , Disp: , Rfl:  .  losartan (COZAAR) 50 MG tablet, Take 50 mg by mouth daily., Disp: , Rfl:  .  metFORMIN (GLUCOPHAGE) 500 MG tablet, Take 500 mg by mouth daily with breakfast. , Disp: , Rfl:  .  metoprolol (LOPRESSOR) 100 MG tablet, Take 100 mg by mouth daily before breakfast. , Disp: , Rfl:  .  Multiple Vitamin (MULTIVITAMIN WITH MINERALS) TABS, Take 1 tablet by mouth every morning. She takes Dance movement psychotherapist Adult 50+., Disp: , Rfl:  .  omeprazole (PRILOSEC) 20 MG capsule, Take 20 mg by mouth every morning. , Disp: , Rfl:  .  PARoxetine (PAXIL) 40 MG tablet, Take 40 mg by mouth daily before breakfast. , Disp: , Rfl:  .  traMADol (ULTRAM) 50 MG tablet, Take 50 mg by mouth every 6 (six) hours as  needed for pain. , Disp: , Rfl:       Review of Systems  Constitutional: Negative for fever and unexpected weight change.  HENT: Negative for congestion, dental problem, ear pain, nosebleeds, postnasal drip, rhinorrhea, sinus pressure, sneezing, sore throat and trouble swallowing.   Eyes: Negative for redness and itching.  Respiratory: Positive for cough and shortness of breath. Negative for chest tightness and wheezing.   Cardiovascular: Negative for palpitations and leg swelling.  Gastrointestinal: Negative for nausea and vomiting.  Genitourinary: Negative for dysuria.  Musculoskeletal: Negative for joint swelling.  Skin: Negative for rash.  Neurological: Negative for headaches.  Hematological: Does not bruise/bleed easily.  Psychiatric/Behavioral: Negative for dysphoric mood. The patient is not nervous/anxious.        Objective:   Physical Exam  Constitutional: She is oriented to person, place, and time. She appears well-developed and well-nourished. No distress.  obese  HENT:  Head: Normocephalic and atraumatic.  Right Ear: External ear normal.  Left Ear: External ear normal.  Mouth/Throat: Oropharynx is clear and moist. No oropharyngeal exudate.  Eyes: Conjunctivae  and EOM are normal. Pupils are equal, round, and reactive to light. Right eye exhibits no discharge. Left eye exhibits no discharge. No scleral icterus.  Neck: Normal range of motion. Neck supple. No JVD present. No tracheal deviation present. No thyromegaly present.  Cardiovascular: Normal rate, regular rhythm, normal heart sounds and intact distal pulses.  Exam reveals no gallop and no friction rub.   No murmur heard. Pulmonary/Chest: Effort normal and breath sounds normal. No respiratory distress. She has no wheezes. She has no rales. She exhibits no tenderness.  Abdominal: Soft. Bowel sounds are normal. She exhibits no distension and no mass. There is no tenderness. There is no rebound and no guarding.  Musculoskeletal: Normal range of motion. She exhibits no edema or tenderness.  Antalgic gait  Lymphadenopathy:    She has no cervical adenopathy.  Neurological: She is alert and oriented to person, place, and time. She has normal reflexes. No cranial nerve deficit. She exhibits normal muscle tone. Coordination normal.  Skin: Skin is warm and dry. No rash noted. She is not diaphoretic. No erythema. No pallor.  Psychiatric: She has a normal mood and affect. Her behavior is normal. Judgment and thought content normal.  Vitals reviewed.   Filed Vitals:   10/27/15 1524  BP: 142/88  Pulse: 79  Height: 5' 5"  (1.651 m)  Weight: 258 lb (117.028 kg)  SpO2: 95%   Estimated body mass index is 42.93 kg/(m^2) as calculated from the following:   Height as of this encounter: 5' 5"  (1.651 m).   Weight as of this encounter: 258 lb (117.028 kg).      Assessment & Plan:     ICD-9-CM ICD-10-CM   1. Shortness of breath 786.05 R06.02 Pulmonary Function Test     CT Chest High Resolution     Pulse oximetry, overnight  2. Smoking history V15.82 Z72.0   3. Pulmonary emphysema, unspecified emphysema type (HCC) 492.8 J43.9 Pulmonary Function Test     CT Chest High Resolution     Pulse oximetry,  overnight  4. Chronic cough 786.2 R05 CT Chest High Resolution  5. Pulmonary hypertension (HCC) 416.8 I27.2   6. Morbid obesity, unspecified obesity type ([place]) 278.01 E66.01     My suspicion is that she is short of breathbecause of chronic emphysema associated with pulmonary hypertension. In association obesity and diastolic dysfunction probably playing a role. She wants treatment starting today. She  has declined in oxygen. She prefers inhaler therapy. However any rule out alternative etiology such as interstitial lung disease of cancer recurrence  PLAN  Please start spiriva daily - take sample, script and show technique- empiric Rx based on CT chest findings from 2013 Use albuterol as needed Recommend ambultatory oxygen -respect refusal  Do following tests - CT chest high resolution - supine and prone - next few weeks - rule out ILD - Full PFT  - ovenright oxygen test on room air   Followup  - next few weeks after tests to see my nurse practitioner - depending on results consider right heart cath v echo  (has CT 2013 with enlarged pulmonary arteries)   Dr. Brand Males, M.D., [facility].C.P Pulmonary and Critical Care Medicine Staff Physician [facility] Pager: [phone], If no answer or between  15:00h - 7:00h: call [phone]  10/27/2015 4:08 PM

## 2015-10-27 NOTE — Patient Instructions (Addendum)
ICD-9-CM ICD-10-CM   1. Shortness of breath 786.05 R06.02 Pulmonary Function Test     CT Chest High Resolution     Pulse oximetry, overnight  2. Smoking history V15.82 Z72.0   3. Pulmonary emphysema, unspecified emphysema type (HCC) 492.8 J43.9 Pulmonary Function Test     CT Chest High Resolution     Pulse oximetry, overnight  4. Chronic cough 786.2 R05 CT Chest High Resolution  5. Pulmonary hypertension (HCC) 416.8 I27.2   6. Morbid obesity, unspecified obesity type ([place]) 278.01 E66.01     Please start spiriva daily - take sample, script and show technique Use albuterol as needed Recommend ambultatory oxygen -respect refusal  Do following tests - CT chest high resolution - supine and prone - next few weeks - rule out ILD - Full PFT  - ovenright oxygen test on room air   Followup  - next few weeks after tests to see my nurse practitioner - depending on results consider right heart cath v echo  (has CT 2013 with enlarged pulmonary arteries)

## 2015-11-11 ENCOUNTER — Telehealth: Payer: Self-pay | Admitting: Internal Medicine

## 2015-11-11 DIAGNOSIS — J439 Emphysema, unspecified: Secondary | ICD-10-CM

## 2015-11-11 NOTE — Telephone Encounter (Signed)
Per MR: ONO on RA from 4.13.17 showed desat during the night, pt will need to be started on 2lpm O2 at night.

## 2015-11-12 ENCOUNTER — Telehealth: Payer: Self-pay | Admitting: Internal Medicine

## 2015-11-12 NOTE — Telephone Encounter (Signed)
Collier Salina, RN at 11/11/2015 10:06 AM     Status: Signed       Expand All Collapse All   Per MR: ONO on RA from 4.13.17 showed desat during the night, pt will need to be started on 2lpm O2 at night.        Called spoke with pt. She is requesting clarification on her ONO results. I reviewed MR's recs and results. She voiced understanding and had no further questions. Nothing further needed.

## 2015-11-12 NOTE — Telephone Encounter (Signed)
Called and spoke to pt. Informed her of the results and recs per MR. Order placed. Pt verbalized understanding and denied any further questions or concerns at this time.

## 2015-11-13 ENCOUNTER — Ambulatory Visit: Payer: Medicare Other | Admitting: Obstetrics and Gynecology

## 2015-11-16 ENCOUNTER — Telehealth: Payer: Self-pay | Admitting: Internal Medicine

## 2015-11-16 NOTE — Telephone Encounter (Signed)
lmtcb x1 for pt. 

## 2015-11-16 NOTE — Telephone Encounter (Signed)
Spoke with pt. She had several questions about her oxygen and inhaler. Pt states that she is getting used to all of these changes and is not quite sure how everything works. Her questions were answered and advised her to call back if she had any further questions or concerns. Nothing further was needed at this time.

## 2015-11-18 ENCOUNTER — Telehealth: Payer: Self-pay | Admitting: Internal Medicine

## 2015-11-18 ENCOUNTER — Ambulatory Visit (HOSPITAL_COMMUNITY)
Admission: RE | Admit: 2015-11-18 | Discharge: 2015-11-18 | Disposition: A | Discharge status: Home / Self Care | Payer: Medicare Other | Source: Ambulatory Visit | Attending: Internal Medicine | Admitting: Internal Medicine

## 2015-11-18 DIAGNOSIS — J439 Emphysema, unspecified: Secondary | ICD-10-CM

## 2015-11-18 DIAGNOSIS — R0602 Shortness of breath: Secondary | ICD-10-CM | POA: Insufficient documentation

## 2015-11-18 LAB — PULMONARY FUNCTION TEST
DL/VA % PRED: 48 %
DL/VA: 2.41 ml/min/mmHg/L
DLCO unc % pred: 23 %
DLCO unc: 6.1 ml/min/mmHg
FEF 25-75 POST: 0.68 L/s
FEF 25-75 Pre: 0.38 L/sec
FEF2575-%CHANGE-POST: 77 %
FEF2575-%PRED-POST: 38 %
FEF2575-%PRED-PRE: 21 %
FEV1-%CHANGE-POST: 11 %
FEV1-%PRED-PRE: 44 %
FEV1-%Pred-Post: 49 %
FEV1-PRE: 0.86 L
FEV1-Post: 0.96 L
FEV1FVC-%CHANGE-POST: -5 %
FEV1FVC-%PRED-PRE: 75 %
FEV6-%Change-Post: 22 %
FEV6-%PRED-PRE: 59 %
FEV6-%Pred-Post: 73 %
FEV6-POST: 1.75 L
FEV6-Pre: 1.42 L
FEV6FVC-%Change-Post: 2 %
FEV6FVC-%Pred-Post: 102 %
FEV6FVC-%Pred-Pre: 100 %
FVC-%CHANGE-POST: 18 %
FVC-%PRED-POST: 70 %
FVC-%PRED-PRE: 59 %
FVC-POST: 1.77 L
FVC-PRE: 1.49 L
PRE FEV1/FVC RATIO: 58 %
PRE FEV6/FVC RATIO: 97 %
Post FEV1/FVC ratio: 54 %
Post FEV6/FVC ratio: 99 %
RV % pred: 125 %
RV: 2.81 L
TLC % pred: 86 %
TLC: 4.49 L

## 2015-11-18 MED ORDER — ALBUTEROL SULFATE (2.5 MG/3ML) 0.083% IN NEBU
2.5000 mg | INHALATION_SOLUTION | Freq: Once | RESPIRATORY_TRACT | Status: AC
  Administered 2015-11-18: 2.5 mg via RESPIRATORY_TRACT

## 2015-11-18 NOTE — Telephone Encounter (Signed)
Called and spoke to pt. Pt states she had scant amounts of bright red blood in her nasal mucus. Advised pt that likely her nasal mucosa is dry and advised her to use OTC saline nasal spray if it continues. Pt completed her PFT today.   Will send to MR as FYI of recs.

## 2015-11-18 NOTE — Telephone Encounter (Signed)
Correct advise on epistaxis - if persists go to ER  Reg: PFT - shows seevere copd 49%. She is seeing Eric Form next week. To get details from [place]

## 2015-11-18 NOTE — Telephone Encounter (Signed)
LVM for pt to return call

## 2015-11-18 NOTE — Telephone Encounter (Signed)
[phone] calling back

## 2015-11-19 ENCOUNTER — Ambulatory Visit (INDEPENDENT_AMBULATORY_CARE_PROVIDER_SITE_OTHER)
Admission: RE | Admit: 2015-11-19 | Discharge: 2015-11-19 | Disposition: A | Discharge status: Home / Self Care | Payer: Medicare Other | Source: Ambulatory Visit | Attending: Internal Medicine | Admitting: Internal Medicine

## 2015-11-19 ENCOUNTER — Telehealth: Payer: Self-pay | Admitting: Internal Medicine

## 2015-11-19 DIAGNOSIS — R0602 Shortness of breath: Secondary | ICD-10-CM | POA: Diagnosis not present

## 2015-11-19 DIAGNOSIS — R053 Chronic cough: Secondary | ICD-10-CM

## 2015-11-19 DIAGNOSIS — R05 Cough: Secondary | ICD-10-CM | POA: Diagnosis not present

## 2015-11-19 NOTE — Telephone Encounter (Signed)
LMTCB

## 2015-11-19 NOTE — Telephone Encounter (Signed)
Message closed in error - see telephone note 11/19/15 Patient spoken to and Nothing further needed.

## 2015-11-19 NOTE — Telephone Encounter (Signed)
Previous message has been closed  Pt aware of rec's per MR - states that the bleeding stopped yesterday afternoon and she did not have any bleeding last night. Pt aware to still use the Nasal Saline which will help in keeping her nasal mucosa moistened and hopefully avoid the severe drying and nose bleeds. Pt expressed understanding. Aware of results of PFT. Will wait to see SG for further information. Nothing further needed.  Brand Males, MD at 11/18/2015 11:06 PM     Status: Signed       Expand All Collapse All   Correct advise on epistaxis - if persists go to ER  Reg: PFT - shows seevere copd 49%. She is seeing Eric Form next week. To get details from [place], RN at 11/18/2015 4:17 PM     Status: Signed       Expand All Collapse All   Called and spoke to pt. Pt states she had scant amounts of bright red blood in her nasal mucus. Advised pt that likely her nasal mucosa is dry and advised her to use OTC saline nasal spray if it continues. Pt completed her PFT today.   Will send to MR as FYI of recs.

## 2015-11-24 ENCOUNTER — Ambulatory Visit: Payer: Medicare Other | Admitting: Acute Care

## 2015-11-25 ENCOUNTER — Ambulatory Visit (INDEPENDENT_AMBULATORY_CARE_PROVIDER_SITE_OTHER): Payer: Medicare Other | Admitting: Acute Care

## 2015-11-25 ENCOUNTER — Encounter: Payer: Self-pay | Admitting: Acute Care

## 2015-11-25 VITALS — BP 132/88 | HR 74 | Ht 65.0 in | Wt 256.4 lb

## 2015-11-25 DIAGNOSIS — I272 Other secondary pulmonary hypertension: Secondary | ICD-10-CM

## 2015-11-25 DIAGNOSIS — J849 Interstitial pulmonary disease, unspecified: Secondary | ICD-10-CM | POA: Insufficient documentation

## 2015-11-25 DIAGNOSIS — R0602 Shortness of breath: Secondary | ICD-10-CM

## 2015-11-25 DIAGNOSIS — J439 Emphysema, unspecified: Secondary | ICD-10-CM

## 2015-11-25 HISTORY — DX: Interstitial pulmonary disease, unspecified: J84.9

## 2015-11-25 MED ORDER — ALBUTEROL SULFATE 108 (90 BASE) MCG/ACT IN AEPB: 2.0000 | INHALATION_SPRAY | Freq: Three times a day (TID) | RESPIRATORY_TRACT | Status: DC | PRN

## 2015-11-25 NOTE — Assessment & Plan Note (Signed)
ILD per most recent CT scan Continued dyspnea on exertion Plan: Auto-immune work up per Dr. Chase Caller Continue using your Incruse inhaler daily. This is your maintenance medication. Take this every day without fail. We will add a Rescue inhaler called Pro Air. Take your rescue inhaler up to every 6 hours as needed for shortness of breath or wheezing. We will teach you how to use the inhaler today in the office Please use saline mist for nasal dryness with oxygen.You can get generic nasal saline at [facility]. We will have your DME evaluate your oxygen tank for humidification. We will walk you today in the office to see if you desaturate with walking. This may qualify you for oxygen during the day also. Follow up with Dr. Chase Caller in 2 months.

## 2015-11-25 NOTE — Progress Notes (Signed)
Subjective:    Patient ID: Amy [name], female    DOB: [birth year], 70 y.o.   MRN: [id number]  HPI  70 year old former smoker, morbidly obese female with History of lung cancer, right lobectomy in 2001  11/25/2015 Follow Up Appointment:  Pt. Presents to the office today for follow up of worsening SOB and cough that started in March.She had an O&O study and after evaluating results  was placed on 2 L  Nocturnal oxygen.She states she continues to have dyspnea on exertion.She was  started Incruse Ellipta inhaler one puff daily 10/30/2015. She states it has helped her cough. She denies fever, no chest pain, no orthopnea, no hemoptysis.Quit smoking 2 years ago. We reviewed her HRCT results, and her pulmonary function results. I explained that the scan indicated interstitial lung disease, and  that she has both obstructive airway disease and restrictive airway disease per her PFT's. We discussed the use of oxygen and optimizing her pulmonary function through use of inhalers and pulmonary toilet . She verbalized understanding.She was walked in the office today  and desaturated on room air to 82%. She immediately rebounded with 2L oxygen, and stated she felt much better.We discussed the need to use oxygen with exertion during the day. She had been resistant to daytime oxygen, but today is more open to wearing it with exertion. I told her I will talk with Dr. Chase Caller about whether or not he wants to do an auto immune work up.She did not have a rescue inhaler, so I did prescribe that today.    Current outpatient prescriptions:  .  ALPRAZolam (XANAX) 1 MG tablet, Take 0.5 mg by mouth at bedtime. , Disp: , Rfl:  .  Ascorbic Acid (VITAMIN C WITH ROSE HIPS) 500 MG tablet, Take 500 mg by mouth every morning. , Disp: , Rfl:  .  atorvastatin (LIPITOR) 20 MG tablet, Take 20 mg by mouth daily before breakfast. , Disp: , Rfl:  .  Calcium Carbonate-Vitamin D (CALCIUM-VITAMIN D) 500-200 MG-UNIT per tablet, Take 1  tablet by mouth 4 (four) times a week. Pt takes medication every other day., Disp: , Rfl:  .  exemestane (AROMASIN) 25 MG tablet, Take 1 tablet (25 mg total) by mouth daily after breakfast., Disp: 90 tablet, Rfl: 3 .  Ferrous Gluconate (IRON) 240 (27 FE) MG TABS, Take 1 tablet by mouth every morning. , Disp: , Rfl:  .  hydrochlorothiazide 25 MG tablet, Take 25 mg by mouth daily before breakfast. , Disp: , Rfl:  .  losartan (COZAAR) 50 MG tablet, Take 50 mg by mouth daily., Disp: , Rfl:  .  metFORMIN (GLUCOPHAGE) 500 MG tablet, Take 500 mg by mouth daily with breakfast. , Disp: , Rfl:  .  metoprolol (LOPRESSOR) 100 MG tablet, Take 100 mg by mouth daily before breakfast. , Disp: , Rfl:  .  Multiple Vitamin (MULTIVITAMIN WITH MINERALS) TABS, Take 1 tablet by mouth every morning. She takes Dance movement psychotherapist Adult 50+., Disp: , Rfl:  .  omeprazole (PRILOSEC) 20 MG capsule, Take 20 mg by mouth every morning. , Disp: , Rfl:  .  PARoxetine (PAXIL) 40 MG tablet, Take 40 mg by mouth daily before breakfast. , Disp: , Rfl:  .  traMADol (ULTRAM) 50 MG tablet, Take 50 mg by mouth every 6 (six) hours as needed for pain. , Disp: , Rfl:  .  umeclidinium bromide (INCRUSE ELLIPTA) 62.5 MCG/INH AEPB, Inhale 1 puff into the lungs daily., Disp: 30 each, Rfl: 11  Past Medical History  Diagnosis Date  . Depression   . Diabetes mellitus     diet controlled  . Anemia   . Anxiety   . Arthritis   . Hernia     Near umbilicus  . Hemorrhoids   . Heart murmur   . Cancer ([place])     colon,renal,lung  . Lung cancer ([place]) 2001  . Colon cancer ([place]) 1999, 2013  . Breast cancer, right breast ([place]) 10/03/12  . Hypertension     Does not see a cardiologist  . Allergy   . Blood transfusion without reported diagnosis 1999    prbc S/P COLON SURGERY  . Cataract     NO SURGERY  . GERD (gastroesophageal reflux disease)   . Chronic kidney disease 1999    RIGHT KIDNEY REMOVED  . Neuromuscular disorder ([place])     PERIPHERAL  NEUROPATHY FEET  . Ulcer     HX YEARS AGO  . Shortness of breath     with exertion   . Hot flashes     5 yr pill for breast CA causing hot flashes  . HX: breast cancer 2014    --right breast  . H/O: lung cancer   . History of kidney cancer   . History of colon cancer   . Blood dyscrasia     Sickle Cell traits  . Sickle cell trait (HCC)     Allergies  Allergen Reactions  . Crab [Shellfish Allergy] Anaphylaxis  . Iodine Anaphylaxis  . Iohexol      Code: HIVES, Desc: pt states , swelling mouth , throat, and has difficulty breathing   . Lactose Intolerance (Gi)   . Adhesive [Tape] Rash  . Latex Itching and Rash    Review of Systems Constitutional:   No  weight loss, night sweats,  Fevers, chills, fatigue, or  lassitude.  HEENT:   No headaches,  Difficulty swallowing,  Tooth/dental problems, or  Sore throat,                No sneezing, itching, ear ache, nasal congestion, post nasal drip,   CV:  No chest pain,  Orthopnea, PND, swelling in lower extremities, anasarca, dizziness, palpitations, syncope.   GI  No heartburn, indigestion, abdominal pain, nausea, vomiting, diarrhea, change in bowel habits, loss of appetite, bloody stools.   Resp: + shortness of breath with exertion not at rest.  No excess mucus, no productive cough,  No non-productive cough,  No coughing up of blood.  No change in color of mucus.  No wheezing.  No chest wall deformity  Skin: no rash or lesions.  GU: no dysuria, change in color of urine, no urgency or frequency.  No flank pain, no hematuria   MS:  No joint pain or swelling.  No decreased range of motion.  No back pain.  Psych:  No change in mood or affect. No depression or anxiety.  No memory loss.        Objective:   Physical Exam  BP 132/88 mmHg  Pulse 74  Ht '5\' 5"'$  (1.651 m)  Wt 256 lb 6.4 oz (116.302 kg)  BMI 42.67 kg/m2  SpO2 90%  LMP 07/26/1983  Physical Exam:  General- No distress,  A&Ox3, very pleasant,  obese ENT: No  sinus tenderness, TM clear, pale nasal mucosa, no oral exudate,no post nasal drip, no LAN Cardiac: S1, S2, regular rate and rhythm, no murmur Chest: No wheeze/ +rales/  No dullness; no accessory muscle use, no nasal  flaring, no sternal retractions Abd.: Soft Non-tender Ext: No clubbing cyanosis, edema Neuro:  normal strength Skin: No rashes, warm and dry Psych: normal mood and behavior  Magdalen Spatz, AGACNP-BC [facility]  11/25/2015     Assessment & Plan:

## 2015-11-25 NOTE — Assessment & Plan Note (Signed)
Continued SOB with exertion: Plan: Continue using your Incruse inhaler daily. This is your maintenance medication. Take this every day without fail. We will add a Rescue inhaler called Pro Air. Take your rescue inhaler up to every 6 hours as needed for shortness of breath or wheezing. We will teach you how to use the inhaler today in the office Please use saline mist for nasal dryness with oxygen.You can get generic nasal saline at [facility]. We will have your DME evaluate your oxygen tank for humidification. We will walk you today in the office to see if you desaturate with walking. This may qualify you for oxygen during the day also. Follow up with Dr. Chase Caller in 2 months. We will call you if Dr.Ramaswamy wants to do lab work. Please contact office for sooner follow up if symptoms do not improve or worsen or seek emergency care

## 2015-11-25 NOTE — Patient Instructions (Addendum)
It is nice to meet you today. Continue using your Incruse inhaler daily. This is your maintenance medication. Take this every day without fail. We will add a Rescue inhaler called Pro Air. Take your rescue inhaler up to every 6 hours as needed for shortness of breath or wheezing. We will teach you how to use the inhaler today in the office Please use saline mist for nasal dryness with oxygen.You can get generic nasal saline at [facility]. We will have your DME evaluate your oxygen tank for humidification. We will walk you today in the office to see if you desaturate with walking. This may qualify you for oxygen during the day also. Follow up with Dr. Chase Caller in 2 months. We will call you if Dr.Ramaswamy wants to do lab work. Please contact office for sooner follow up if symptoms do not improve or worsen or seek emergency care

## 2015-11-27 ENCOUNTER — Telehealth: Payer: Self-pay | Admitting: Internal Medicine

## 2015-11-27 DIAGNOSIS — J849 Interstitial pulmonary disease, unspecified: Secondary | ICD-10-CM

## 2015-11-27 NOTE — Telephone Encounter (Signed)
Ardelle Balls and I discussed Marcene Corning -she needs to do ltd autommune antyhime between 11/27/2015 and 1 week before next OV - she can do ESR, ACE, ANA, RF, CCP, SSA, SSB, scl-70, CK,   Thanks  Dr. Brand Males, M.D., [facility].C.P Pulmonary and Critical Care Medicine Staff Physician [place] [facility] Pager: [phone], If no answer or between  15:00h - 7:00h: call [phone]  11/27/2015 4:45 PM

## 2015-11-30 NOTE — Telephone Encounter (Signed)
lmtcb for pt.  

## 2015-12-01 ENCOUNTER — Telehealth: Payer: Self-pay | Admitting: Internal Medicine

## 2015-12-01 DIAGNOSIS — J439 Emphysema, unspecified: Secondary | ICD-10-CM

## 2015-12-01 DIAGNOSIS — C3491 Malignant neoplasm of unspecified part of right bronchus or lung: Secondary | ICD-10-CM

## 2015-12-01 NOTE — Telephone Encounter (Signed)
Shirlean Mylar called very concerned for patient. Pt was 82% at rest today. She did POC Eval and put pt on 2 liters of oxygen. She would like an order sent for POC and m6 tanks for patient.Fax# [phone]       Checked for POC today, 82% at rest with no oxygen. Patient was 83% with activity on RA.  On 2L at rest, 95%, with activity 2L 98%.  ----------- Order entered for O2 and POC. Spoke with Maudie Mercury and Shirlean Mylar, they are both aware that order has been placed. Nothing further needed.

## 2015-12-02 NOTE — Telephone Encounter (Signed)
Called and spoke to pt. Informed her of the recs per MR. Order placed. Pt verbalized understanding and denied any further questions or concerns at this time.

## 2015-12-14 ENCOUNTER — Ambulatory Visit: Payer: Medicare Other | Admitting: Obstetrics and Gynecology

## 2015-12-30 ENCOUNTER — Ambulatory Visit (INDEPENDENT_AMBULATORY_CARE_PROVIDER_SITE_OTHER): Payer: Medicare Other | Admitting: Obstetrics and Gynecology

## 2015-12-30 ENCOUNTER — Encounter: Payer: Self-pay | Admitting: Obstetrics and Gynecology

## 2015-12-30 VITALS — BP 138/90 | HR 86 | Resp 18 | Ht 65.0 in | Wt 257.0 lb

## 2015-12-30 DIAGNOSIS — F329 Major depressive disorder, single episode, unspecified: Secondary | ICD-10-CM | POA: Diagnosis not present

## 2015-12-30 DIAGNOSIS — F32A Depression, unspecified: Secondary | ICD-10-CM

## 2015-12-30 DIAGNOSIS — Z853 Personal history of malignant neoplasm of breast: Secondary | ICD-10-CM | POA: Diagnosis not present

## 2015-12-30 DIAGNOSIS — Z01419 Encounter for gynecological examination (general) (routine) without abnormal findings: Secondary | ICD-10-CM

## 2015-12-30 NOTE — Progress Notes (Signed)
Spoke with the [employer]. The patient is due for bilateral diagnostic mammogram. Appointment scheduled for 01/06/2016 at 2:40 pm for bilateral diagnostic mammogram. She is agreeable to date and time. Placed in mammogram hold.

## 2015-12-30 NOTE — Progress Notes (Signed)
70 y.o. G80P1010 Widowed [place] American female here for annual exam.    Using O2 now for COPD.  Using inhalers also.   Patient with history of colon cancer, renal cancer, Rt. Breast cancer and lung cancer.   Has frequent and night time urination.  Up 2 -3 times a night.  Takes a diuretic.  Loose BM once a week.  Takes Paxil for depression. Thinks this is helpful. Denies suicidal ideation.  Spending time with friends and family.  Has experienced a lot of losses and feels like she is just biding her time. Has seen psychologist and psychiatrist in past.   Had pelvic ultrasound in 2016 to check ovaries due to limited exam.  Ovaries normal.   PCP:   Dr. Seward Carol  Patient's last menstrual period was 07/26/1983.           Sexually active: No.  The current method of family planning is status post hysterectomy.    Exercising: No.  The patient does not participate in regular exercise at present. Smoker:  no  Health Maintenance: Pap:  06/14/12 wnl History of abnormal Pap:  no MMG:  10/03/2014-BIRADS 3. Diagnostic of R breast, Lumpectomy-01/19/15 Colonoscopy:  10/08/14-Polyps. Repeat in 2 yrs BMD: 04/24/13-wnl TDaP:  PCP Screening Labs:  n/a   reports that she quit smoking about 2 years ago. Her smoking use included Cigarettes. She started smoking about 23 years ago. She has a 26.25 pack-year smoking history. She has never used smokeless tobacco. She reports that she does not drink alcohol or use illicit drugs.  Past Medical History  Diagnosis Date  . Depression   . Diabetes mellitus     diet controlled  . Anemia   . Anxiety   . Arthritis   . Hernia     Near umbilicus  . Hemorrhoids   . Heart murmur   . Cancer ([place])     colon,renal,lung  . Lung cancer ([place]) 2001  . Colon cancer ([place]) 1999, 2013  . Breast cancer, right breast ([place]) 10/03/12  . Hypertension     Does not see a cardiologist  . Allergy   . Blood transfusion without reported diagnosis 1999    prbc  S/P COLON SURGERY  . Cataract     NO SURGERY  . GERD (gastroesophageal reflux disease)   . Chronic kidney disease 1999    RIGHT KIDNEY REMOVED  . Neuromuscular disorder ([place])     PERIPHERAL NEUROPATHY FEET  . Ulcer     HX YEARS AGO  . Shortness of breath     with exertion   . Hot flashes     5 yr pill for breast CA causing hot flashes  . HX: breast cancer 2014    --right breast  . H/O: lung cancer   . History of kidney cancer   . History of colon cancer   . Blood dyscrasia     Sickle Cell traits  . Sickle cell trait ([place])   . Interstitial lung disease ([place]) 11/25/2015    Past Surgical History  Procedure Laterality Date  . Colonoscopy    . Polypectomy    . Lung lobectomy      right lower  . Colon surgery      chemo  . Kidney surgery  1999    right/chemo kidney removed for cancer  . Partial hysterectomy    . Breast biopsy      Bilateral cysts/benign  . Colectomy  05-31-2012    laparoscopic ,possible open  .  Abdominal hysterectomy  80's    fibroids  . Tonsillectomy      as child  . Partial colectomy  05/31/2012    Procedure: PARTIAL COLECTOMY;  Surgeon: Rolm Bookbinder, MD;  Location: [facility];  Service: General;  Laterality: N/A;  . Breast biopsy Right 10/03/12    ER/PR +  . Breast lumpectomy with needle localization Right 12/20/2012    Procedure: BREAST LUMPECTOMY WITH NEEDLE LOCALIZATION;  Surgeon: Rolm Bookbinder, MD;  Location: [facility];  Service: General;  Laterality: Right;  . Breast biopsy Right 12/20/2012    Procedure: Right breast Excisional Biopsy;  Surgeon: Rolm Bookbinder, MD;  Location: [facility];  Service: General;  Laterality: Right;  . Re-excision of breast cancer,superior margins Right 01/21/2013    Procedure: RE-EXCISION OF BREAST CANCER,SUPERIOR MARGINS;  Surgeon: Rolm Bookbinder, MD;  Location: [facility];  Service: General;  Laterality: Right;  . Tubal ligation    . Breast lumpectomy with radioactive seed localization Right 01/19/2015    Procedure: RIGHT  BREAST LUMPECTOMY WITH RADIOACTIVE SEED LOCALIZATION;  Surgeon: Rolm Bookbinder, MD;  Location: [facility];  Service: General;  Laterality: Right;    Current Outpatient Prescriptions  Medication Sig Dispense Refill  . albuterol (ACCUNEB) 0.63 MG/3ML nebulizer solution Take 1 ampule by nebulization every 6 (six) hours as needed for wheezing.    . Albuterol Sulfate (PROAIR RESPICLICK) 282 (90 Base) MCG/ACT AEPB Inhale 2 puffs into the lungs every 8 (eight) hours as needed. 1 each 0  . ALPRAZolam (XANAX) 1 MG tablet Take 0.5 mg by mouth at bedtime.     . Ascorbic Acid (VITAMIN C WITH ROSE HIPS) 500 MG tablet Take 500 mg by mouth every morning.     Marland Kitchen atorvastatin (LIPITOR) 20 MG tablet Take 20 mg by mouth daily before breakfast.     . Calcium Carbonate-Vitamin D (CALCIUM-VITAMIN D) 500-200 MG-UNIT per tablet Take 1 tablet by mouth 4 (four) times a week. Pt takes medication every other day.    . exemestane (AROMASIN) 25 MG tablet Take 1 tablet (25 mg total) by mouth daily after breakfast. 90 tablet 3  . Ferrous Gluconate (IRON) 240 (27 FE) MG TABS Take 1 tablet by mouth every morning.     . hydrochlorothiazide 25 MG tablet Take 25 mg by mouth daily before breakfast.     . losartan (COZAAR) 50 MG tablet Take 50 mg by mouth daily.    . metFORMIN (GLUCOPHAGE) 500 MG tablet Take 500 mg by mouth daily with breakfast.     . metoprolol (LOPRESSOR) 100 MG tablet Take 100 mg by mouth daily before breakfast.     . Multiple Vitamin (MULTIVITAMIN WITH MINERALS) TABS Take 1 tablet by mouth every morning. She takes Dance movement psychotherapist Adult 50+.    Marland Kitchen omeprazole (PRILOSEC) 20 MG capsule Take 20 mg by mouth every morning.     Marland Kitchen PARoxetine (PAXIL) 40 MG tablet Take 40 mg by mouth daily before breakfast.     . traMADol (ULTRAM) 50 MG tablet Take 50 mg by mouth every 6 (six) hours as needed for pain.     Marland Kitchen umeclidinium bromide (INCRUSE ELLIPTA) 62.5 MCG/INH AEPB Inhale 1 puff into the lungs daily. 30 each  11   No current facility-administered medications for this visit.    Family History  Problem Relation Age of Onset  . Colon polyps Brother   . Multiple myeloma Brother 3  . Breast cancer Mother     dx in her 45s  . Heart  disease Father   . Esophageal cancer Neg Hx   . Rectal cancer Neg Hx   . Stomach cancer Neg Hx   . Colon cancer Paternal Aunt   . Breast cancer Maternal Grandmother   . Colon cancer Cousin     2 paternal first cousins  . Leukemia Cousin 11    ROS:  Pertinent items are noted in HPI.  Otherwise, a comprehensive ROS was negative.  Exam:   BP 138/90 mmHg  Pulse 86  Resp 18  Ht 5' 5" (1.651 m)  Wt 257 lb (116.574 kg)  BMI 42.77 kg/m2  LMP 07/26/1983    General appearance: alert, cooperative and appears stated age Head: Normocephalic, without obvious abnormality, atraumatic Neck: no adenopathy, supple, symmetrical, trachea midline and thyroid normal to inspection and palpation Lungs: clear to auscultation bilaterally Breasts: normal appearance, no masses or tenderness, Inspection negative, No nipple retraction or dimpling, No nipple discharge or bleeding, No axillary or supraclavicular adenopathy, scars of the right breast.  Heart: regular rate and rhythm Abdomen: incisions:  Yes.     Vertical midline incision with hernia palpable to the right - nontender , soft, non-tender; no masses, no organomegaly Extremities: extremities normal, atraumatic, no cyanosis or edema Skin: Skin color, texture, turgor normal. No rashes or lesions Lymph nodes: Cervical, supraclavicular, and axillary nodes normal. No abnormal inguinal nodes palpated Neurologic: Grossly normal  Pelvic: External genitalia:  no lesions              Urethra:  normal appearing urethra with no masses, tenderness or lesions              Bartholins and Skenes: normal                 Vagina: normal appearing vagina with normal color and discharge, no lesions              Cervix: absent               Pap taken: No. Bimanual Exam:  Uterus:  uterus absent              Adnexa: no mass, fullness, tenderness and exam limited by [facility].              Rectal exam: Yes.  .  Confirms.              Anus:  normal sphincter tone, no lesions  Chaperone was present for exam.  Assessment:   Well woman visit with normal exam. Status post TAH for fibroids. Ovaries retained.  Multiple malignancies of right breast, colon, right kidney, and right lung. Depression.  Morbid obesity. On O2 for COPD.  Plan: Yearly mammogram recommended after age 33.  Will schedule for patient.  Recommended self breast exam.  Pap and HR HPV as above. Discussed Calcium, Vitamin D, regular exercise program including cardiovascular and weight bearing exercise. Discussed increasing fiber in the diet.  Labs performed.  No..    Prescription medication(s) given.  No..   Information for patient to see counselor at [facility] for her depression.  She states she will call to make appt. Follow up annually and prn.       After visit summary provided.

## 2016-01-01 ENCOUNTER — Encounter: Payer: Self-pay | Admitting: Obstetrics and Gynecology

## 2016-01-06 ENCOUNTER — Ambulatory Visit
Admission: RE | Admit: 2016-01-06 | Discharge: 2016-01-06 | Disposition: A | Discharge status: Home / Self Care | Payer: Medicare Other | Source: Ambulatory Visit | Attending: Obstetrics and Gynecology | Admitting: Obstetrics and Gynecology

## 2016-01-06 DIAGNOSIS — Z853 Personal history of malignant neoplasm of breast: Secondary | ICD-10-CM

## 2016-01-18 MED FILL — EXEMESTANE 25 MG TABLET: 25 | 90 days supply | Qty: 90 | Fill #3

## 2016-01-19 ENCOUNTER — Other Ambulatory Visit (INDEPENDENT_AMBULATORY_CARE_PROVIDER_SITE_OTHER): Payer: Medicare Other

## 2016-01-19 DIAGNOSIS — J849 Interstitial pulmonary disease, unspecified: Secondary | ICD-10-CM

## 2016-01-19 LAB — CARDIAC PANEL
CK-MB: 1 ng/mL (ref 0.3–4.0)
RELATIVE INDEX: 0.2 calc (ref 0.0–2.5)
Total CK: 48 U/L (ref 7–177)

## 2016-01-19 LAB — SEDIMENTATION RATE: Sed Rate: 11 mm/hr (ref 0–30)

## 2016-01-20 LAB — ANTI-SCLERODERMA ANTIBODY: Scleroderma (Scl-70) (ENA) Antibody, IgG: <1

## 2016-01-20 LAB — ANA: Anti Nuclear Antibody(ANA): NEGATIVE

## 2016-01-20 LAB — SJOGRENS SYNDROME-B EXTRACTABLE NUCLEAR ANTIBODY: SSB (La) (ENA) Antibody, IgG: <1

## 2016-01-20 LAB — RHEUMATOID FACTOR: Rhuematoid fact SerPl-aCnc: <10 IU/mL (ref <=14)

## 2016-01-20 LAB — CYCLIC CITRUL PEPTIDE ANTIBODY, IGG: Cyclic Citrullin Peptide Ab: <16 Units

## 2016-01-20 LAB — ANGIOTENSIN CONVERTING ENZYME: Angiotensin-Converting Enzyme: 18 U/L (ref 8–52)

## 2016-01-20 LAB — SJOGRENS SYNDROME-A EXTRACTABLE NUCLEAR ANTIBODY: SSA (RO) (ENA) ANTIBODY, IGG: NEGATIVE

## 2016-01-28 ENCOUNTER — Ambulatory Visit (INDEPENDENT_AMBULATORY_CARE_PROVIDER_SITE_OTHER): Payer: Medicare Other | Admitting: Internal Medicine

## 2016-01-28 ENCOUNTER — Encounter: Payer: Self-pay | Admitting: Internal Medicine

## 2016-01-28 VITALS — BP 142/84 | HR 74 | Ht 66.0 in | Wt 259.6 lb

## 2016-01-28 DIAGNOSIS — J438 Other emphysema: Secondary | ICD-10-CM

## 2016-01-28 DIAGNOSIS — J9611 Chronic respiratory failure with hypoxia: Secondary | ICD-10-CM

## 2016-01-28 DIAGNOSIS — I272 Other secondary pulmonary hypertension: Secondary | ICD-10-CM

## 2016-01-28 DIAGNOSIS — J849 Interstitial pulmonary disease, unspecified: Secondary | ICD-10-CM

## 2016-01-28 NOTE — Patient Instructions (Addendum)
It is good to see you today. Continue wearing your oxygen day and night as you have been doing. Call your insurance company and see which of the anticholinergic medications is covered better than Incruse. If they can tell you which of these medications is covered better by your plan, call us and we will phone in a prescription for it. Continue your Incuse once daily as you have been doing. Remember to rinse your mouth after use. Continue using your Pro Air as needed for shortness of breath or wheezing. We will check for samples today. We will set you up for  2 D Echo.prior to next visit. We will refer you for pulmonary rehab. Follow up with Dr. Chase Caller in 3 months. Please contact office for sooner follow up if symptoms do not improve or worsen or seek emergency care

## 2016-01-28 NOTE — Assessment & Plan Note (Addendum)
Continued Dyspnea on exertion Auto immune work up was negative Plan: Continue wearing your oxygen day and night as you have been doing. Call your insurance company and see which of the anticholinergic medications is covered better than Incruse. If they can tell you which of these medications is covered better by your plan, call us and we will phone in a prescription for it. Continue your Incuse once daily as you have been doing. Remember to rinse your mouth after use. Continue using your Pro Air as needed for shortness of breath or wheezing. We will check for samples today. We will schedule you for a 2 D Echo today. We will refer you for pulmonary rehab. You can add nasal saline spray for your nasal dryness, this can be purchased at [facility] or [facility]. Follow up with Dr. Chase Caller in 6 months. Please contact office for sooner follow up if symptoms do not improve or worsen or seek emergency care

## 2016-01-28 NOTE — Progress Notes (Signed)
History of Present Illness Amy [name] is a 70 y.o. female former smoker with a history of lung cancer  And right lobectomy in 2001.She has chronic emphysema associated with pulmonary hypertension. In association obesity and diastolic dysfunction probably playing a role.   01/28/2016  2 Month Follow up. Patient presents to the office for follow up. She is compliant with her Incruse and ProAir rescue inhaler. We reviewed the difference between maintenance medications and rescue medications.She does get confused in regard to how to take her medications. She is now on oxygen 24/7 after she had been found by home health nurses with saturations of 82%. She states that she feels better on the oxygen day and night. She continues to have ringing in her ears.This has been worked up, and she states nobody can find the reason. She denies fever, cough, chest pain, orthopnea or hemoptysis.She states she feels the oxygen has really helped.She does not see a cardiologist and has not had an echo done .Auto immune work up was negative.  Tests: 11/19/2015 HRCT: IMPRESSION: 1. 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 2. There also appears to be some degree of associated mucoid impaction within terminal bronchioles throughout the right lower lobe. 3. Mild diffuse bronchial wall thickening with mild centrilobular emphysema; imaging findings suggestive of underlying COPD. 4. Dilatation of the pulmonic trunk (4.1 cm in diameter), suggesting pulmonary arterial hypertension.  Past medical hx Past Medical History  Diagnosis Date  . Depression   . Diabetes mellitus     diet controlled  . Anemia   . Anxiety   . Arthritis   . Hernia     Near umbilicus  . Hemorrhoids   . Heart murmur   . Cancer ([place])     colon,renal,lung  . Lung cancer ([place]) 2001  . Colon cancer ([place]) 1999, 2013  . Breast cancer,  right breast ([place]) 10/03/12  . Hypertension     Does not see a cardiologist  . Allergy   . Blood transfusion without reported diagnosis 1999    prbc S/P COLON SURGERY  . Cataract     NO SURGERY  . GERD (gastroesophageal reflux disease)   . Chronic kidney disease 1999    RIGHT KIDNEY REMOVED  . Neuromuscular disorder ([place])     PERIPHERAL NEUROPATHY FEET  . Ulcer     HX YEARS AGO  . Shortness of breath     with exertion   . Hot flashes     5 yr pill for breast CA causing hot flashes  . HX: breast cancer 2014    --right breast  . H/O: lung cancer   . History of kidney cancer   . History of colon cancer   . Blood dyscrasia     Sickle Cell traits  . Sickle cell trait ([place])   . Interstitial lung disease ([place]) 11/25/2015     Past surgical hx, Family hx, Social hx all reviewed.  Current Outpatient Prescriptions on File Prior to Visit  Medication Sig  . albuterol (ACCUNEB) 0.63 MG/3ML nebulizer solution Take 1 ampule by nebulization every 6 (six) hours as needed for wheezing.  . Albuterol Sulfate (PROAIR RESPICLICK) 387 (90 Base) MCG/ACT AEPB Inhale 2 puffs into the lungs every 8 (eight) hours as needed.  . ALPRAZolam (XANAX) 1 MG tablet Take 0.5 mg by mouth at bedtime.   . Ascorbic Acid (VITAMIN C WITH ROSE HIPS) 500 MG tablet  Take 500 mg by mouth every morning.   Marland Kitchen atorvastatin (LIPITOR) 20 MG tablet Take 20 mg by mouth daily before breakfast.   . Calcium Carbonate-Vitamin D (CALCIUM-VITAMIN D) 500-200 MG-UNIT per tablet Take 1 tablet by mouth 4 (four) times a week. Pt takes medication every other day.  . exemestane (AROMASIN) 25 MG tablet Take 1 tablet (25 mg total) by mouth daily after breakfast.  . Ferrous Gluconate (IRON) 240 (27 FE) MG TABS Take 1 tablet by mouth every morning.   . hydrochlorothiazide 25 MG tablet Take 25 mg by mouth daily before breakfast.   . losartan (COZAAR) 50 MG tablet Take 50 mg by mouth daily.  . metFORMIN (GLUCOPHAGE) 500 MG tablet Take 500 mg by mouth  daily with breakfast.   . metoprolol (LOPRESSOR) 100 MG tablet Take 100 mg by mouth daily before breakfast.   . Multiple Vitamin (MULTIVITAMIN WITH MINERALS) TABS Take 1 tablet by mouth every morning. She takes Dance movement psychotherapist Adult 50+.  Marland Kitchen omeprazole (PRILOSEC) 20 MG capsule Take 20 mg by mouth every morning.   Marland Kitchen PARoxetine (PAXIL) 40 MG tablet Take 40 mg by mouth daily before breakfast.   . traMADol (ULTRAM) 50 MG tablet Take 50 mg by mouth every 6 (six) hours as needed for pain.   Marland Kitchen umeclidinium bromide (INCRUSE ELLIPTA) 62.5 MCG/INH AEPB Inhale 1 puff into the lungs daily.   No current facility-administered medications on file prior to visit.     Allergies  Allergen Reactions  . Crab [Shellfish Allergy] Anaphylaxis  . Iodine Anaphylaxis  . Iohexol      Code: HIVES, Desc: pt states , swelling mouth , throat, and has difficulty breathing   . Lactose Intolerance (Gi)   . Adhesive [Tape] Rash  . Latex Itching and Rash    Review Of Systems:  Constitutional:   No  weight loss, night sweats,  Fevers, chills, fatigue, or  lassitude.  HEENT:   No headaches,  Difficulty swallowing,  Tooth/dental problems, or  Sore throat,                No sneezing, itching, ear ache, nasal congestion, post nasal drip,   CV:  No chest pain,  Orthopnea, PND, swelling in lower extremities, anasarca, dizziness, palpitations, syncope.   GI  No heartburn, indigestion, abdominal pain, nausea, vomiting, diarrhea, change in bowel habits, loss of appetite, bloody stools.   Resp: + shortness of breath with exertion not  at rest.  No excess mucus, no productive cough,  No non-productive cough,  No coughing up of blood.  No change in color of mucus.  No wheezing.  No chest wall deformity  Skin: no rash or lesions.  GU: no dysuria, change in color of urine, no urgency or frequency.  No flank pain, no hematuria   MS:  No joint pain or swelling.  No decreased range of motion.  No back pain.  Psych:  No change in  mood or affect. No depression or anxiety.  No memory loss.   Vital Signs BP 142/84 mmHg  Pulse 74  Ht '5\' 6"'$  (1.676 m)  Wt 259 lb 9.6 oz (117.754 kg)  BMI 41.92 kg/m2  SpO2 91%  LMP 07/26/1983   Physical Exam:  General- No distress,  A&Ox3, obese ENT: No sinus tenderness, TM clear, pale nasal mucosa, no oral exudate,no post nasal drip, no LAN Cardiac: S1, S2, regular rate and rhythm, no murmur Chest: No wheeze/ rales/ dullness; no accessory muscle use, no nasal flaring, no  sternal retractions, diminished per bases bilaterally. Abd.: Soft Non-tender Ext: No clubbing cyanosis, edema Neuro:  normal strength Skin: No rashes, warm and dry Psych: normal mood and behavior   Assessment/Plan  Interstitial lung disease ([place]) Continued Dyspnea on exertion Auto immune work up was negative Plan: Continue wearing your oxygen day and night as you have been doing. Call your insurance company and see which of the anticholinergic medications is covered better than Incruse. If they can tell you which of these medications is covered better by your plan, call us and we will phone in a prescription for it. Continue your Incuse once daily as you have been doing. Remember to rinse your mouth after use. Continue using your Pro Air as needed for shortness of breath or wheezing. We will check for samples today. We will schedule you for a 2 D Echo today. We will refer you for pulmonary rehab. You can add nasal saline spray for your nasal dryness, this can be purchased at [facility] or [place]. Follow up with Dr. Chase Caller in 6 months. Please contact office for sooner follow up if symptoms do not improve or worsen or seek emergency care       Magdalen Spatz, NP 01/28/2016  5:18 PM    STAFF NOTE: I, Dr Ann Lions have personally reviewed patient's available data, including medical history, events of note, physical examination and test results as part of my evaluation. I have discussed with  resident/NP and other care providers such as pharmacist, RN and RRT.  In addition,  I personally evaluated patient and elicited key findings of   S: to followup dyspnea eval. CT aprl 2017 shows Non-IPF pattern of ILD with emphysema. PFT April 2017 has obsructin as biger comppm,ent fev1 0.96L/49%, Ratio 54, DLCO 23%. She is now on o2 and it helps. Autoimmune workup end June 2017 is normal. CT also shows likely cor pulmonale  She seems to have poor insight into disease and despite multiple attempts at explanation she seems overwhelmed. APP also felt same  O: obese Mild basal crackles Overall diminished air entry  A:    ICD-9-CM ICD-10-CM   1. Chronic respiratory failure with hypoxia (HCC) 518.83 J96.11    799.02    2. Interstitial lung disease ([place]) 515 J84.9 ECHOCARDIOGRAM COMPLETE     AMB referral to pulmonary rehabilitation  3. Pulmonary hypertension (HCC) 416.8 I27.2 ECHOCARDIOGRAM COMPLETE     AMB referral to pulmonary rehabilitation  4. Other emphysema (HCC) 492.8 J43.8      P: surgical lung bx for ILD undiff indicate but I Think too high risk Do symptom support - do rehab, o2, mdi for emphysea,  Get ECHO - if PASP high - might need right heart cath   > 50% of this > 25 min visit spent in face to face counseling or coordination of care    .  Rest per NP/medical resident whose note is outlined above and that I agree with   Dr. Brand Males, M.D., [facility]C.P Pulmonary and Critical Care Medicine Staff Physician [place] [facility] Pager: [phone], If no answer or between  15:00h - 7:00h: call [phone]  01/28/2016 6:05 PM

## 2016-02-11 ENCOUNTER — Telehealth: Payer: Self-pay | Admitting: Internal Medicine

## 2016-02-11 NOTE — Telephone Encounter (Signed)
Called spoke with pt. She is requesting samples of incruise. I informed her that two samples have been placed at the front for pick up. She states she will come tomorrow to pick them up. She voiced understanding and had no further questions. Samples placed at the front. Nothing further needed.

## 2016-02-11 NOTE — Telephone Encounter (Signed)
Left message for patient to call back  

## 2016-02-11 NOTE — Telephone Encounter (Signed)
[phone] pt calling back

## 2016-02-12 ENCOUNTER — Other Ambulatory Visit (HOSPITAL_COMMUNITY): Payer: Medicare Other

## 2016-02-16 ENCOUNTER — Other Ambulatory Visit (HOSPITAL_COMMUNITY): Payer: Self-pay

## 2016-02-16 ENCOUNTER — Ambulatory Visit (HOSPITAL_COMMUNITY): Payer: Medicare Other | Attending: Cardiology

## 2016-02-16 DIAGNOSIS — I272 Other secondary pulmonary hypertension: Secondary | ICD-10-CM

## 2016-02-16 DIAGNOSIS — J849 Interstitial pulmonary disease, unspecified: Secondary | ICD-10-CM | POA: Diagnosis not present

## 2016-02-16 DIAGNOSIS — Z87891 Personal history of nicotine dependence: Secondary | ICD-10-CM | POA: Diagnosis not present

## 2016-02-16 DIAGNOSIS — I340 Nonrheumatic mitral (valve) insufficiency: Secondary | ICD-10-CM | POA: Insufficient documentation

## 2016-02-16 DIAGNOSIS — I119 Hypertensive heart disease without heart failure: Secondary | ICD-10-CM | POA: Insufficient documentation

## 2016-02-16 DIAGNOSIS — Z6841 Body Mass Index (BMI) 40.0 and over, adult: Secondary | ICD-10-CM | POA: Diagnosis not present

## 2016-02-16 DIAGNOSIS — E119 Type 2 diabetes mellitus without complications: Secondary | ICD-10-CM | POA: Insufficient documentation

## 2016-02-16 DIAGNOSIS — J449 Chronic obstructive pulmonary disease, unspecified: Secondary | ICD-10-CM | POA: Diagnosis not present

## 2016-02-18 ENCOUNTER — Telehealth: Payer: Self-pay | Admitting: Internal Medicine

## 2016-02-18 NOTE — Telephone Encounter (Signed)
Spoke with pt. States that she had a 2D echo on Tuesday. During the test she had to lay on her side for 2 hours. She is having lots of pain in her R hip. Pt has been taking Tylenol and using a heating pad for the pain. Advised her that she is doing the right things and that if pained persisted she would need to call her PCP to be seen and possible have an Xray done. She agreed and verbalized understanding. Nothing further was needed.

## 2016-02-20 ENCOUNTER — Telehealth: Payer: Self-pay | Admitting: Internal Medicine

## 2016-02-20 DIAGNOSIS — R931 Abnormal findings on diagnostic imaging of heart and coronary circulation: Secondary | ICD-10-CM

## 2016-02-20 NOTE — Telephone Encounter (Signed)
Echo shows evidence of pulmonary hypertension. Please refer to Dr Aundra Dubin or Angelena Form or Burt Knack or Dr Jeffie Pollock for right heart cath evaluation  Thanks  Dr. Brand Males, M.D., [facility].C.P Pulmonary and Critical Care Medicine Staff Physician [place] [facility] Pager: [phone], If no answer or between  15:00h - 7:00h: call [phone]  02/20/2016 6:47 AM    Study Conclusions  - Left ventricle: The cavity size was normal. There was moderate focal basal hypertrophy of the septum. Systolic function was vigorous. The estimated ejection fraction was in the range of 65% to 70%. Wall motion was normal; there were no regional wall motion abnormalities. Doppler parameters are consistent with abnormal left ventricular relaxation (grade 1 diastolic dysfunction). - Mitral valve: Calcified annulus. There was mild regurgitation. - Right ventricle: The cavity size was mildly dilated. Wall thickness was normal. - Right atrium: The atrium was mildly dilated. - Pulmonary arteries: Systolic pressure was moderately increased. PA peak pressure: 51 mm Hg (S).

## 2016-02-22 NOTE — Telephone Encounter (Signed)
Spoke with pt, aware of recs.  Referral placed for R heart cath.  Nothing further needed.

## 2016-02-26 ENCOUNTER — Encounter (HOSPITAL_COMMUNITY): Payer: Self-pay | Admitting: *Deleted

## 2016-02-29 ENCOUNTER — Other Ambulatory Visit: Payer: Self-pay

## 2016-02-29 ENCOUNTER — Other Ambulatory Visit (HOSPITAL_COMMUNITY): Payer: Self-pay | Admitting: *Deleted

## 2016-02-29 DIAGNOSIS — C50911 Malignant neoplasm of unspecified site of right female breast: Secondary | ICD-10-CM

## 2016-02-29 DIAGNOSIS — I272 Pulmonary hypertension, unspecified: Secondary | ICD-10-CM

## 2016-03-01 ENCOUNTER — Ambulatory Visit (HOSPITAL_BASED_OUTPATIENT_CLINIC_OR_DEPARTMENT_OTHER): Payer: Medicare Other | Admitting: Hematology and Oncology

## 2016-03-01 ENCOUNTER — Telehealth: Payer: Self-pay | Admitting: Hematology and Oncology

## 2016-03-01 ENCOUNTER — Other Ambulatory Visit (HOSPITAL_BASED_OUTPATIENT_CLINIC_OR_DEPARTMENT_OTHER): Payer: Medicare Other

## 2016-03-01 ENCOUNTER — Encounter: Payer: Self-pay | Admitting: Hematology and Oncology

## 2016-03-01 VITALS — BP 134/71 | HR 80 | Temp 99.0°F | Resp 19 | Ht 66.0 in | Wt 258.5 lb

## 2016-03-01 DIAGNOSIS — D0511 Intraductal carcinoma in situ of right breast: Secondary | ICD-10-CM

## 2016-03-01 DIAGNOSIS — Z85118 Personal history of other malignant neoplasm of bronchus and lung: Secondary | ICD-10-CM

## 2016-03-01 DIAGNOSIS — C50911 Malignant neoplasm of unspecified site of right female breast: Secondary | ICD-10-CM

## 2016-03-01 DIAGNOSIS — C3491 Malignant neoplasm of unspecified part of right bronchus or lung: Secondary | ICD-10-CM

## 2016-03-01 DIAGNOSIS — Z170 Estrogen receptor positive status [ER+]: Secondary | ICD-10-CM | POA: Diagnosis not present

## 2016-03-01 DIAGNOSIS — R0602 Shortness of breath: Secondary | ICD-10-CM

## 2016-03-01 DIAGNOSIS — C186 Malignant neoplasm of descending colon: Secondary | ICD-10-CM

## 2016-03-01 DIAGNOSIS — C50411 Malignant neoplasm of upper-outer quadrant of right female breast: Secondary | ICD-10-CM

## 2016-03-01 LAB — BASIC METABOLIC PANEL
ANION GAP: 7 meq/L (ref 3–11)
BUN: 20.1 mg/dL (ref 7.0–26.0)
CALCIUM: 10 mg/dL (ref 8.4–10.4)
CO2: 38 mEq/L — ABNORMAL HIGH (ref 22–29)
CREATININE: 1 mg/dL (ref 0.6–1.1)
Chloride: 96 mEq/L — ABNORMAL LOW (ref 98–109)
EGFR: 63 mL/min/{1.73_m2} — ABNORMAL LOW (ref >90)
Glucose: 128 mg/dl (ref 70–140)
POTASSIUM: 4.1 meq/L (ref 3.5–5.1)
Sodium: 142 mEq/L (ref 136–145)

## 2016-03-01 LAB — CBC WITH DIFFERENTIAL/PLATELET
BASO%: 0.6 % (ref 0.0–2.0)
BASOS ABS: 0 10*3/uL (ref 0.0–0.1)
EOS%: 2.2 % (ref 0.0–7.0)
Eosinophils Absolute: 0.1 10*3/uL (ref 0.0–0.5)
HCT: 38.6 % (ref 34.8–46.6)
HGB: 12.5 g/dL (ref 11.6–15.9)
LYMPH%: 26.6 % (ref 14.0–49.7)
MCH: 28.3 pg (ref 25.1–34.0)
MCHC: 32.3 g/dL (ref 31.5–36.0)
MCV: 87.7 fL (ref 79.5–101.0)
MONO#: 0.4 10*3/uL (ref 0.1–0.9)
MONO%: 6.2 % (ref 0.0–14.0)
NEUT#: 4 10*3/uL (ref 1.5–6.5)
NEUT%: 64.4 % (ref 38.4–76.8)
PLATELETS: 142 10*3/uL — AB (ref 145–400)
RBC: 4.4 10*6/uL (ref 3.70–5.45)
RDW: 15.5 % — ABNORMAL HIGH (ref 11.2–14.5)
WBC: 6.2 10*3/uL (ref 3.9–10.3)
lymph#: 1.6 10*3/uL (ref 0.9–3.3)

## 2016-03-01 LAB — PROTIME-INR
INR: 1 — ABNORMAL LOW (ref 2.00–3.50)
PROTIME: 12 s (ref 10.6–13.4)

## 2016-03-01 NOTE — Assessment & Plan Note (Signed)
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 She underwent re-excision on 01/21/13. Pathology revealed no residual carcinoma or atypia. Did not require radiation currently on hormonal therapy with Aromasin 25 mg daily from 04/02/2013  Aromasin toxicities: No major side effects from Aromasin other than occasional hot flashes and joint pains Breast cancer surveillance: 1. Breast exam done 03/01/2016 is normal 2. Mammograms March 2017: Revealed 2 areas of architectural distortion in the right breast from prior surgery no new or suspicious masses  lumpectomy 01/19/2015: Benign breast tissue  Shortness of breath to minimal exertion: I suspect that this may be related to underlying COPD.  Continue with current treatment with Aromasin and continue with the surveillance plan. Return to clinic in 1 year for follow-up

## 2016-03-01 NOTE — Progress Notes (Signed)
Patient Care Team: Seward Carol, MD as PCP - General (Internal Medicine)  DIAGNOSIS: No matching staging information was found for the patient.  SUMMARY OF ONCOLOGIC HISTORY:   Non-small cell cancer of right lung ([place])   06/02/2000 Surgery    T1 N0 non-small cell lung cancer removed from the right lung with a VATS procedure by Dr. 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 She appears be tolerating Aromasin extremely well. She fell recently and hurt her knee and ankle. This happened last Friday. She has a few bruises. She denies any lumps or nodules breasts.  REVIEW OF SYSTEMS:   Constitutional: Denies fevers, chills or abnormal weight loss Eyes: Denies blurriness of vision Ears, nose, mouth, throat, and face: Denies mucositis or sore throat Respiratory: Denies cough, dyspnea or wheezes Cardiovascular: Denies palpitation, chest discomfort Gastrointestinal:  Denies nausea, heartburn or change in bowel habits Skin: Denies abnormal skin rashes Lymphatics: Denies new lymphadenopathy or easy bruising Neurological:Denies numbness, tingling or new weaknesses Behavioral/Psych: Mood is stable, no new changes    Extremities: No lower extremity edema Breast:  denies any pain or lumps or nodules in either breasts All other systems were reviewed with the patient and are negative.  I have reviewed the past medical history, past surgical history, social history and family history with the patient and they are unchanged from previous note.  ALLERGIES:  is allergic to crab [shellfish allergy]; iodine; iohexol; lactose intolerance (gi); adhesive [tape]; and latex.  MEDICATIONS:  Current Outpatient Prescriptions  Medication Sig Dispense Refill  . Albuterol Sulfate (PROAIR RESPICLICK) 329 (90 Base) MCG/ACT AEPB Inhale 2 puffs into the lungs every 8 (eight) hours as needed. 1 each 0  . ALPRAZolam (XANAX) 1 MG tablet Take 0.5 mg by mouth at bedtime.     . Ascorbic Acid (VITAMIN C WITH ROSE HIPS) 500 MG tablet Take 500 mg by mouth every morning.     Marland Kitchen atorvastatin (LIPITOR) 20 MG tablet Take 20 mg by mouth daily before breakfast.     . Calcium Carbonate-Vitamin D (CALCIUM-VITAMIN D) 500-200 MG-UNIT per tablet Take 1 tablet by mouth 3 (three) times a week.     Marland Kitchen exemestane (AROMASIN) 25 MG tablet Take 1 tablet (25 mg total) by mouth daily after breakfast. 90 tablet 3  . Ferrous Gluconate (IRON) 240 (27 FE) MG TABS Take 1 tablet by mouth every morning.     . hydrochlorothiazide 25 MG tablet Take 25 mg by mouth daily before breakfast.     . losartan (COZAAR) 50 MG tablet Take 50 mg by mouth daily.    . metFORMIN (GLUCOPHAGE) 500 MG tablet Take 500 mg by mouth  daily with breakfast.     . metoprolol (LOPRESSOR) 100 MG tablet Take 100 mg by mouth daily before breakfast.     . Multiple Vitamin (MULTIVITAMIN WITH MINERALS) TABS Take 1 tablet by mouth every morning. She takes Dance movement psychotherapist Adult 50+.    Marland Kitchen omeprazole (PRILOSEC) 20 MG capsule Take 20 mg by mouth every morning.     Marland Kitchen PARoxetine (PAXIL) 40 MG tablet Take 40 mg by mouth daily before breakfast.     . traMADol (ULTRAM) 50 MG tablet Take 50 mg by mouth every  6 (six) hours as needed for pain.     Marland Kitchen umeclidinium bromide (INCRUSE ELLIPTA) 62.5 MCG/INH AEPB Inhale 1 puff into the lungs daily. 30 each 11   No current facility-administered medications for this visit.     PHYSICAL EXAMINATION: ECOG PERFORMANCE STATUS: 1 - Symptomatic but completely ambulatory  Vitals:   03/01/16 1407  BP: 134/71  Pulse: 80  Resp: 19  Temp: 99 F (37.2 C)   Filed Weights   03/01/16 1407  Weight: 258 lb 8 oz (117.3 kg)    GENERAL:alert, no distress and comfortable SKIN: skin color, texture, turgor are normal, no rashes or significant lesions EYES: normal, Conjunctiva are pink and non-injected, sclera clear OROPHARYNX:no exudate, no erythema and lips, buccal mucosa, and tongue normal  NECK: supple, thyroid normal size, non-tender, without nodularity LYMPH:  no palpable lymphadenopathy in the cervical, axillary or inguinal LUNGS: clear to auscultation and percussion with normal breathing effort HEART: regular rate & rhythm and no murmurs and no lower extremity edema ABDOMEN:abdomen soft, non-tender and normal bowel sounds MUSCULOSKELETAL:no cyanosis of digits and no clubbing  NEURO: alert & oriented x 3 with fluent speech, no focal motor/sensory deficits EXTREMITIES: No lower extremity edema  LABORATORY DATA:  I have reviewed the data as listed   Chemistry      Component Value Date/Time   NA 142 03/01/2016 1354   K 4.1 03/01/2016 1354   CL 101 01/15/2015 1420   CL 100 12/27/2012 1418   CO2 38 (H) 03/01/2016 1354   BUN 20.1 03/01/2016 1354   CREATININE 1.0 03/01/2016 1354      Component Value Date/Time   CALCIUM 10.0 03/01/2016 1354   ALKPHOS 90 09/02/2014 1412   AST 18 09/02/2014 1412   ALT 20 09/02/2014 1412   BILITOT 0.66 09/02/2014 1412       Lab Results  Component Value Date   WBC 6.2 03/01/2016   HGB 12.5 03/01/2016   HCT 38.6 03/01/2016   MCV 87.7 03/01/2016   PLT 142 (L) 03/01/2016   NEUTROABS 4.0 03/01/2016     ASSESSMENT  & PLAN:  Breast cancer of upper-outer quadrant of right female breast ([place]) 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 She underwent re-excision on 01/21/13. Pathology revealed no residual carcinoma or atypia. Did not require radiation currently on hormonal therapy with Aromasin 25 mg daily from 04/02/2013  Aromasin toxicities: No major side effects from Aromasin other than occasional hot flashes and joint pains Breast cancer surveillance: 1. Breast exam done 03/01/2016 is normal 2. Mammograms March 2017: Revealed 2 areas of architectural distortion in the right breast from prior surgery no new or suspicious masses  lumpectomy 01/19/2015: Benign breast tissue  Shortness of breath to minimal exertion: I suspect that this may be related to underlying COPD.  Continue with current treatment with Aromasin and continue with the surveillance plan. Return to clinic in 1 year for  follow-up   No orders of the defined types were placed in this encounter.  The patient has a good understanding of the overall plan. she agrees with it. she will call with any problems that may develop before the next visit here.   Rulon Eisenmenger, MD 03/01/16

## 2016-03-01 NOTE — Telephone Encounter (Signed)
appt made and avs printed °

## 2016-03-02 ENCOUNTER — Encounter (HOSPITAL_COMMUNITY)
Admission: RE | Disposition: A | Discharge status: Home / Self Care | Payer: Self-pay | Source: Ambulatory Visit | Attending: Internal Medicine

## 2016-03-02 ENCOUNTER — Other Ambulatory Visit: Payer: Self-pay

## 2016-03-02 ENCOUNTER — Encounter (HOSPITAL_COMMUNITY): Payer: Self-pay | Admitting: Internal Medicine

## 2016-03-02 ENCOUNTER — Ambulatory Visit (HOSPITAL_COMMUNITY)
Admission: RE | Admit: 2016-03-02 | Discharge: 2016-03-02 | Disposition: A | Discharge status: Home / Self Care | Payer: Medicare Other | Source: Ambulatory Visit | Attending: Internal Medicine | Admitting: Internal Medicine

## 2016-03-02 DIAGNOSIS — E739 Lactose intolerance, unspecified: Secondary | ICD-10-CM | POA: Diagnosis not present

## 2016-03-02 DIAGNOSIS — N189 Chronic kidney disease, unspecified: Secondary | ICD-10-CM | POA: Insufficient documentation

## 2016-03-02 DIAGNOSIS — E669 Obesity, unspecified: Secondary | ICD-10-CM | POA: Diagnosis not present

## 2016-03-02 DIAGNOSIS — Z85118 Personal history of other malignant neoplasm of bronchus and lung: Secondary | ICD-10-CM | POA: Insufficient documentation

## 2016-03-02 DIAGNOSIS — F419 Anxiety disorder, unspecified: Secondary | ICD-10-CM | POA: Insufficient documentation

## 2016-03-02 DIAGNOSIS — E1122 Type 2 diabetes mellitus with diabetic chronic kidney disease: Secondary | ICD-10-CM | POA: Diagnosis not present

## 2016-03-02 DIAGNOSIS — K219 Gastro-esophageal reflux disease without esophagitis: Secondary | ICD-10-CM | POA: Diagnosis not present

## 2016-03-02 DIAGNOSIS — Z91040 Latex allergy status: Secondary | ICD-10-CM | POA: Insufficient documentation

## 2016-03-02 DIAGNOSIS — Z800 Family history of malignant neoplasm of digestive organs: Secondary | ICD-10-CM | POA: Insufficient documentation

## 2016-03-02 DIAGNOSIS — Z853 Personal history of malignant neoplasm of breast: Secondary | ICD-10-CM | POA: Diagnosis not present

## 2016-03-02 DIAGNOSIS — Z85038 Personal history of other malignant neoplasm of large intestine: Secondary | ICD-10-CM | POA: Diagnosis not present

## 2016-03-02 DIAGNOSIS — Z883 Allergy status to other anti-infective agents status: Secondary | ICD-10-CM | POA: Diagnosis not present

## 2016-03-02 DIAGNOSIS — Z902 Acquired absence of lung [part of]: Secondary | ICD-10-CM | POA: Insufficient documentation

## 2016-03-02 DIAGNOSIS — Z85528 Personal history of other malignant neoplasm of kidney: Secondary | ICD-10-CM | POA: Diagnosis not present

## 2016-03-02 DIAGNOSIS — J9610 Chronic respiratory failure, unspecified whether with hypoxia or hypercapnia: Secondary | ICD-10-CM | POA: Diagnosis not present

## 2016-03-02 DIAGNOSIS — Z91013 Allergy to seafood: Secondary | ICD-10-CM | POA: Diagnosis not present

## 2016-03-02 DIAGNOSIS — J449 Chronic obstructive pulmonary disease, unspecified: Secondary | ICD-10-CM | POA: Insufficient documentation

## 2016-03-02 DIAGNOSIS — Z6841 Body Mass Index (BMI) 40.0 and over, adult: Secondary | ICD-10-CM | POA: Diagnosis not present

## 2016-03-02 DIAGNOSIS — Z7982 Long term (current) use of aspirin: Secondary | ICD-10-CM | POA: Diagnosis not present

## 2016-03-02 DIAGNOSIS — F329 Major depressive disorder, single episode, unspecified: Secondary | ICD-10-CM | POA: Diagnosis not present

## 2016-03-02 DIAGNOSIS — Z803 Family history of malignant neoplasm of breast: Secondary | ICD-10-CM | POA: Insufficient documentation

## 2016-03-02 DIAGNOSIS — I272 Other secondary pulmonary hypertension: Secondary | ICD-10-CM | POA: Diagnosis not present

## 2016-03-02 DIAGNOSIS — J849 Interstitial pulmonary disease, unspecified: Secondary | ICD-10-CM | POA: Insufficient documentation

## 2016-03-02 DIAGNOSIS — I130 Hypertensive heart and chronic kidney disease with heart failure and stage 1 through stage 4 chronic kidney disease, or unspecified chronic kidney disease: Secondary | ICD-10-CM | POA: Insufficient documentation

## 2016-03-02 DIAGNOSIS — I5032 Chronic diastolic (congestive) heart failure: Secondary | ICD-10-CM | POA: Insufficient documentation

## 2016-03-02 DIAGNOSIS — D573 Sickle-cell trait: Secondary | ICD-10-CM | POA: Insufficient documentation

## 2016-03-02 DIAGNOSIS — Z9181 History of falling: Secondary | ICD-10-CM | POA: Insufficient documentation

## 2016-03-02 DIAGNOSIS — M1990 Unspecified osteoarthritis, unspecified site: Secondary | ICD-10-CM | POA: Diagnosis not present

## 2016-03-02 DIAGNOSIS — Z87891 Personal history of nicotine dependence: Secondary | ICD-10-CM | POA: Insufficient documentation

## 2016-03-02 DIAGNOSIS — E1142 Type 2 diabetes mellitus with diabetic polyneuropathy: Secondary | ICD-10-CM | POA: Diagnosis not present

## 2016-03-02 DIAGNOSIS — Z8249 Family history of ischemic heart disease and other diseases of the circulatory system: Secondary | ICD-10-CM | POA: Insufficient documentation

## 2016-03-02 HISTORY — PX: CARDIAC CATHETERIZATION: SHX172

## 2016-03-02 LAB — POCT I-STAT 3, VENOUS BLOOD GAS (G3P V)
ACID-BASE EXCESS: 10 mmol/L — AB (ref 0.0–2.0)
ACID-BASE EXCESS: 9 mmol/L — AB (ref 0.0–2.0)
Bicarbonate: 35.8 mEq/L — ABNORMAL HIGH (ref 20.0–24.0)
Bicarbonate: 37.6 mEq/L — ABNORMAL HIGH (ref 20.0–24.0)
O2 SAT: 61 %
O2 Saturation: 60 %
PH VEN: 7.383 — AB (ref 7.250–7.300)
PH VEN: 7.387 — AB (ref 7.250–7.300)
PO2 VEN: 33 mmHg (ref 31.0–45.0)
TCO2: 38 mmol/L (ref 0–100)
TCO2: 39 mmol/L (ref 0–100)
pCO2, Ven: 60 mmHg — ABNORMAL HIGH (ref 45.0–50.0)
pCO2, Ven: 62.5 mmHg — ABNORMAL HIGH (ref 45.0–50.0)
pO2, Ven: 33 mmHg (ref 31.0–45.0)

## 2016-03-02 LAB — GLUCOSE, CAPILLARY: Glucose-Capillary: 145 mg/dL — ABNORMAL HIGH (ref 65–99)

## 2016-03-02 SURGERY — RIGHT HEART CATH
Anesthesia: LOCAL

## 2016-03-02 MED ORDER — SODIUM CHLORIDE 0.9% FLUSH: 3.0000 mL | INTRAVENOUS | Status: DC | PRN

## 2016-03-02 MED ORDER — LIDOCAINE HCL (PF) 1 % IJ SOLN
INTRAMUSCULAR | Status: AC
  Filled 2016-03-02: qty 30

## 2016-03-02 MED ORDER — SODIUM CHLORIDE 0.9 % IV SOLN
INTRAVENOUS | Status: DC
  Administered 2016-03-02: 07:00:00 via INTRAVENOUS

## 2016-03-02 MED ORDER — ASPIRIN 81 MG PO CHEW: 81.0000 mg | CHEWABLE_TABLET | ORAL | Status: DC

## 2016-03-02 MED ORDER — SODIUM CHLORIDE 0.9% FLUSH: 3.0000 mL | Freq: Two times a day (BID) | INTRAVENOUS | Status: DC

## 2016-03-02 MED ORDER — LIDOCAINE HCL (PF) 1 % IJ SOLN
INTRAMUSCULAR | Status: DC | PRN
  Administered 2016-03-02: 2 mL

## 2016-03-02 MED ORDER — SODIUM CHLORIDE 0.9 % IV SOLN: 250.0000 mL | INTRAVENOUS | Status: DC | PRN

## 2016-03-02 MED ORDER — ONDANSETRON HCL 4 MG/2ML IJ SOLN: 4.0000 mg | Freq: Four times a day (QID) | INTRAMUSCULAR | Status: DC | PRN

## 2016-03-02 MED ORDER — ASPIRIN 81 MG PO CHEW
CHEWABLE_TABLET | ORAL | Status: AC
  Filled 2016-03-02: qty 1

## 2016-03-02 MED ORDER — HEPARIN (PORCINE) IN NACL 2-0.9 UNIT/ML-% IJ SOLN
INTRAMUSCULAR | Status: AC
  Filled 2016-03-02: qty 500

## 2016-03-02 MED ORDER — FENTANYL CITRATE (PF) 100 MCG/2ML IJ SOLN
INTRAMUSCULAR | Status: DC | PRN
  Administered 2016-03-02: 50 ug via INTRAVENOUS

## 2016-03-02 MED ORDER — FENTANYL CITRATE (PF) 100 MCG/2ML IJ SOLN
INTRAMUSCULAR | Status: AC
  Filled 2016-03-02: qty 2

## 2016-03-02 MED ORDER — HEPARIN (PORCINE) IN NACL 2-0.9 UNIT/ML-% IJ SOLN
INTRAMUSCULAR | Status: DC | PRN
  Administered 2016-03-02: 500 mL

## 2016-03-02 MED ORDER — ACETAMINOPHEN 325 MG PO TABS: 650.0000 mg | ORAL_TABLET | ORAL | Status: DC | PRN

## 2016-03-02 SURGICAL SUPPLY — 7 items
CATH BALLN WEDGE 5F 110CM (CATHETERS) ×2 IMPLANT
PACK CARDIAC CATHETERIZATION (CUSTOM PROCEDURE TRAY) ×2 IMPLANT
PROTECTION STATION PRESSURIZED (MISCELLANEOUS) ×2
SHEATH FAST CATH BRACH 5F 5CM (SHEATH) ×2 IMPLANT
STATION PROTECTION PRESSURIZED (MISCELLANEOUS) ×1 IMPLANT
TRANSDUCER W/STOPCOCK (MISCELLANEOUS) ×4 IMPLANT
TUBING CIL FLEX 10 FLL-RA (TUBING) ×2 IMPLANT

## 2016-03-02 NOTE — Discharge Instructions (Signed)
Venogram, Care After °Refer to this sheet in the next few weeks. These instructions provide you with information on caring for yourself after your procedure. Your health care provider may also give you more specific instructions. Your treatment has been planned according to current medical practices, but problems sometimes occur. Call your health care provider if you have any problems or questions after your procedure. °WHAT TO EXPECT AFTER THE PROCEDURE °After your procedure, it is typical to have the following sensations: °· Mild discomfort at the catheter insertion site. °HOME CARE INSTRUCTIONS  °· Take all medicines exactly as directed. °· Follow any prescribed diet. °· Follow instructions regarding both rest and physical activity. °· Drink more fluids for the first several days after the procedure in order to help flush dye from your kidneys. °SEEK MEDICAL CARE IF: °· You develop a rash. °· You have fever not controlled by medicine. °SEEK IMMEDIATE MEDICAL CARE IF: °· There is pain, drainage, bleeding, redness, swelling, warmth or a red streak at the site of the IV tube. °· The extremity where your IV tube was placed becomes discolored, numb, or cool. °· You have difficulty breathing or shortness of breath. °· You develop chest pain. °· You have excessive dizziness or fainting. °  °This information is not intended to replace advice given to you by your health care provider. Make sure you discuss any questions you have with your health care provider. °  °Document Released: 05/01/2013 Document Revised: 07/16/2013 Document Reviewed: 05/01/2013 °Elsevier Interactive Patient Education ©2016 [employer] Inc. ° °

## 2016-03-02 NOTE — Progress Notes (Signed)
Dr Jeffie Pollock was paged to verify DC time, pt Dr pt can leave now.

## 2016-03-02 NOTE — Progress Notes (Signed)
Pt states she fell at home 5 days ago, c/o left leg 8/10 pain; Eliezer Champagne notified; also severe allergy to Shellfish and Iodine

## 2016-03-02 NOTE — H&P (Signed)
ADVANCED HF CLINIC CONSULT NOTE  Referring Physician: Chase Caller   HPI:  Amy [name] is a 70 y.o. obese female former smoker with a history of lung cancer  And right lobectomy in 2001. She has chronic emphysema associated with pulmonary hypertension and wears home O2, diastolic HF, DM2, HTN and CKD.   She has chronic dyspnea on exertion. She denies CP, orthopnea or PND. Occasional edema. No syncope. Recent echo showed EF 65% with RVSP 39m Hg and normal RV function. She has been referred by Dr. RChase Callerfor a RHC to evaluate PH.  She has had a recent fall and strained her right calf but is able to walk on it.   Review of Systems: [y] = yes, _0  = no   General: Weight gain _1 ; Weight loss _2 ; Anorexia _3 ; Fatigue [ y]; Fever _4 ; Chills _5 ; Weakness _6   Cardiac: Chest pain/pressure _7 ; Resting SOB _8 ; Exertional SOB [Blue.Reese]; Orthopnea _9 ; Pedal Edema [ y]; Palpitations _10 ; Syncope _11 ; Presyncope _12 ; Paroxysmal nocturnal dyspnea_13   Pulmonary: Cough _14 ; Wheezing_15 ; Hemoptysis_16 ; Sputum _17 ; Snoring _18   GI: Vomiting_19 ; Dysphagia_20 ; Melena_21 ; Hematochezia _22 ; Heartburn_23 ; Abdominal pain _24 ; Constipation _25 ; Diarrhea _26 ; BRBPR _27   GU: Hematuria_28 ; Dysuria _29 ; Nocturia_30   Vascular: Pain in legs with walking [Blue.Reese]; Pain in feet with lying flat _31 ; Non-healing sores _32 ; Stroke _33 ; TIA _34 ; Slurred speech _35 ;  Neuro: Headaches_36 ; Vertigo_37 ; Seizures_38 ; Paresthesias_39 ;Blurred vision _40 ; Diplopia _41 ; Vision changes _42   Ortho/Skin: Arthritis [Blue.Reese]; Joint pain [Blue.Reese]; Muscle pain [ y]; Joint swelling _43 ; Back Pain _44 ; Rash _45   Psych: Depression[ y]; Anxiety[y ]  Heme: Bleeding problems _46 ; Clotting disorders _47 ; Anemia [Blue.Reese]  Endocrine: Diabetes [ y]; Thyroid dysfunction_48    Past Medical History:  Diagnosis Date  . Allergy   . Anemia   . Anxiety   . Arthritis   . Blood dyscrasia    Sickle Cell traits  . Blood transfusion without reported diagnosis  1999   prbc S/P COLON SURGERY  . Breast cancer, right breast ([facility] 10/03[place]  . Cancer ([place]    colon,renal,lung  . Cataract    NO SURGERY  . Chronic kidney disease 1999   RIGHT KIDNEY REMOVED  . Colon cancer ([facility] 1999, 2013  . Depression   . Diabetes mellitus    diet controlled  . GERD (gastroesophageal reflux disease)   . H/O: lung cancer   . Heart murmur   . Hemorrhoids   . Hernia    Near umbilicus  . History of colon cancer   . History of kidney cancer   . Hot flashes    5 yr pill for breast CA causing hot flashes  . HX: breast cancer 2014   --right breast  . Hypertension    Does not see a cardiologist  . Interstitial lung disease ([facility] 11/25/2015  . Lung cancer ([facility] 2001  . Neuromuscular disorder ([facility]    PERIPHERAL NEUROPATHY FEET  . Shortness of breath    with exertion   . Sickle cell trait ([facility]   . Ulcer    HX YEARS AGO    Current Facility-Administered Medications  Medication Dose Route Frequency Provider Last Rate Last Dose  . 0.9 %  sodium  chloride infusion  250 mL Intravenous PRN Jolaine Artist, MD      . Derrill Memo ON 03/03/2016] 0.9 %  sodium chloride infusion   Intravenous Continuous Jolaine Artist, MD 10 mL/hr at 03/02/16 0725    . aspirin chewable tablet 81 mg  81 mg Oral Pre-Cath Marvetta Vohs Alfonso Patten Derrin Currey, MD      . sodium chloride flush (NS) 0.9 % injection 3 mL  3 mL Intravenous Q12H Shaune Pascal Jaysean Manville, MD      . sodium chloride flush (NS) 0.9 % injection 3 mL  3 mL Intravenous PRN Jolaine Artist, MD        Allergies  Allergen Reactions  . Crab [Shellfish Allergy] Anaphylaxis  . Iodine Anaphylaxis  . Iohexol      Code: HIVES, Desc: pt states , swelling mouth , throat, and has difficulty breathing   . Lactose Intolerance (Gi)   . Adhesive [Tape] Rash  . Latex Itching and Rash      Social History   Social History  . Marital status: Widowed    Spouse name: N/A  . Number of children: N/A  . Years of education: N/A   Occupational  History  . retired    Social History Main Topics  . Smoking status: Former Smoker    Packs/day: 1.25    Years: 21.00    Types: Cigarettes    Start date: 10/30/1992    Quit date: 11/20/2013  . Smokeless tobacco: Never Used     Comment: quit then started up again 2years ago--smokes 1pack every 2-3 days  . Alcohol use No  . Drug use: No  . Sexual activity: No     Comment: TAH--ovaries retained   Other Topics Concern  . Not on file   Social History Narrative  . No narrative on file      Family History  Problem Relation Age of Onset  . Colon polyps Brother   . Multiple myeloma Brother 45  . Breast cancer Mother     dx in her 86s  . Heart disease Father   . Esophageal cancer Neg Hx   . Rectal cancer Neg Hx   . Stomach cancer Neg Hx   . Colon cancer Paternal Aunt   . Breast cancer Maternal Grandmother   . Colon cancer Cousin     2 paternal first cousins  . Leukemia Cousin 11    Vitals:   03/02/16 0640  BP: (!) 142/96  Resp: 18  Temp: 98.9 F (37.2 C)  TempSrc: Oral  SpO2: 100%  Weight: 117 kg (258 lb)  Height: _0  (1.676 m)    PHYSICAL EXAM: General:  Obese woman sitting up in bed No respiratory difficulty HEENT: normal Neck: supple. No obvious JVD. Carotids 2+ bilat; no bruits. No lymphadenopathy or thryomegaly appreciated. Cor: PMI nondisplaced. Regular rate & rhythm. No rubs, gallops or murmurs. Lungs: clear Abdomen: obese soft, nontender, nondistended.. No bruits or masses. Good bowel sounds. Extremities: no cyanosis, clubbing, rash, edema  Mild tenderness to palpation over left achilles Neuro: alert & oriented x 3, cranial nerves grossly intact. moves all 4 extremities w/o difficulty. Affect pleasant.   ASSESSMENT & PLAN: 1. COPD with chronic respiratory failure 2. Pulmonary HTN by echo 3. Dyspnea on exertion 4. Chronic diastolic HF 5. Lung Ca s/p R lobectomy.    She has evidence of PH on echo. Will proceed with RHC to evaluate severity and  measure R and L sided pressures.   Len Kluver,MD 8:20  AM    

## 2016-03-29 ENCOUNTER — Telehealth: Payer: Self-pay | Admitting: Internal Medicine

## 2016-03-29 DIAGNOSIS — J438 Other emphysema: Secondary | ICD-10-CM

## 2016-03-29 MED ORDER — IPRATROPIUM-ALBUTEROL 0.5-2.5 (3) MG/3ML IN SOLN: 3.0000 mL | Freq: Four times a day (QID) | RESPIRATORY_TRACT | 5 refills | Status: DC

## 2016-03-29 NOTE — Telephone Encounter (Signed)
Spoke with pt.  She states she cannot afford the $45/mth copay for Incruse anymore.  She states that she called her insurance company to see if there was something cheaper that was comparable and was told that there isnt one.  She states that she feels like it does help some but still uses her Proair if sob when out and about.  Please advise.

## 2016-03-29 NOTE — Telephone Encounter (Signed)
Pt returning call.Amy [name] ° °

## 2016-03-29 NOTE — Telephone Encounter (Signed)
Change to duoneb via medicare part B - dme referral 4 times daily scheduled  Thanks  Dr. Brand Males, M.D., [facility].C.P Pulmonary and Critical Care Medicine Staff Physician [place] [facility] Pager: [phone], If no answer or between  15:00h - 7:00h: call [phone]  03/29/2016 3:51 PM

## 2016-03-29 NOTE — Telephone Encounter (Signed)
Spoke with pt, aware of recs.  Pt does not have nebulizer in home.  This has been ordered also.  Nothing further needed at this time.

## 2016-03-29 NOTE — Telephone Encounter (Signed)
lmtcb x1 for pt. 

## 2016-04-01 ENCOUNTER — Ambulatory Visit
Admission: RE | Admit: 2016-04-01 | Discharge: 2016-04-01 | Disposition: A | Discharge status: Home / Self Care | Payer: Medicare Other | Source: Ambulatory Visit | Attending: Hematology and Oncology | Admitting: Hematology and Oncology

## 2016-04-01 DIAGNOSIS — C50411 Malignant neoplasm of upper-outer quadrant of right female breast: Secondary | ICD-10-CM

## 2016-04-04 ENCOUNTER — Telehealth: Payer: Self-pay | Admitting: Hematology and Oncology

## 2016-04-04 NOTE — Telephone Encounter (Signed)
I called and left a message that the bone density was normal T score 1.4

## 2016-04-18 ENCOUNTER — Other Ambulatory Visit: Payer: Self-pay | Admitting: Hematology and Oncology

## 2016-04-18 DIAGNOSIS — C50911 Malignant neoplasm of unspecified site of right female breast: Secondary | ICD-10-CM

## 2016-04-18 MED FILL — EXEMESTANE 25 MG TABLET: 25 | 90 days supply | Qty: 90 | Fill #0

## 2016-04-19 ENCOUNTER — Telehealth: Payer: Self-pay | Admitting: Internal Medicine

## 2016-04-19 NOTE — Telephone Encounter (Signed)
Attempted to contact patient, left message for patient to return call.

## 2016-04-20 NOTE — Telephone Encounter (Signed)
Called and spoke with pt and she stated that she takes the nebulizer tx 4 times per day.  She stated that by the time she gets to her 3rd tx, she is very jittery and wanted to see what MR thought of this.  MR please advise. thanks

## 2016-04-20 NOTE — Telephone Encounter (Signed)
Patient returning call -pr

## 2016-04-20 NOTE — Telephone Encounter (Signed)
lmtcb x2 for pt. 

## 2016-04-21 NOTE — Telephone Encounter (Signed)
Called spoke with pt. Aware of below. appt scheduled to see TP tomorrow at 9:!5. Pt aware to bring all meds with her.  Nothing further needed

## 2016-04-21 NOTE — Telephone Encounter (Signed)
Las note July 2017 from [place] has her on incruse and pro-air prn. So not sure why she is doing neb (presumed alb) f4x a day. Pls have her come ni for med calendar and review hx with TP  Thanks  Dr. Brand Males, M.D., [facility].C.P Pulmonary and Critical Care Medicine Staff Physician [place] [facility] Pager: [phone], If no answer or between  15:00h - 7:00h: call [phone]  04/21/2016 12:27 AM

## 2016-04-21 NOTE — Telephone Encounter (Signed)
Attempted to contact pt. No answer, no option to leave a message. Will try back.  

## 2016-04-21 NOTE — Telephone Encounter (Signed)
Pt returning call.Amy [name] ° °

## 2016-04-22 ENCOUNTER — Ambulatory Visit (INDEPENDENT_AMBULATORY_CARE_PROVIDER_SITE_OTHER): Payer: Medicare Other | Admitting: Adult Health

## 2016-04-22 ENCOUNTER — Encounter: Payer: Self-pay | Admitting: Adult Health

## 2016-04-22 DIAGNOSIS — J849 Interstitial pulmonary disease, unspecified: Secondary | ICD-10-CM | POA: Diagnosis not present

## 2016-04-22 DIAGNOSIS — I272 Other secondary pulmonary hypertension: Secondary | ICD-10-CM | POA: Diagnosis not present

## 2016-04-22 DIAGNOSIS — J439 Emphysema, unspecified: Secondary | ICD-10-CM

## 2016-04-22 NOTE — Assessment & Plan Note (Signed)
Continue on current regimen   Patient Instructions  Decrease Albuterol /Ipratropium (Duoneb) Three times a day  .  Follow med calendar closely and bring to each visit.  Follow up Dr. Chase Caller in 2-3 months and As needed

## 2016-04-22 NOTE — Progress Notes (Signed)
Subjective:    Patient ID: Amy [name], female    DOB: 1945/12/01, 70 y.o.   MRN: [id number]  HPI 70 year old female former smoker with COPD with emphysema, lung cancer status post right lobectomy in 2001. 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 2. There also appears to be some degree of associated mucoid impaction within terminal bronchioles throughout the right lower lobe. 3. Mild diffuse bronchial wall thickening with mild centrilobular emphysema; imaging findings suggestive of underlying COPD. 4. Dilatation of the pulmonic trunk (4.1 cm in diameter), suggesting pulmonary arterial hypertension.  04/22/2016 Follow up : COPD/ILD  Patient presents for a two-month follow-up and medication review. Patient was previously on Incruse.  Incruse was not covered by insurance, changed to [employer] Four times a day  .  Says it causes some jitteriness if she does Four times a day  , better on Three times a day  .  Flu shot is utd.  Patient underwent a right heart catheter 03/02/2016 that showed mild pulmonary hypertension with normal PVR.  Says overall she is doing okay. Does get winded with activity that oxygen seems to help. She denies any chest pain, orthopnea, PND, or increased leg swelling.  Past Medical History:  Diagnosis Date  . Allergy   . Anemia   . Anxiety   . Arthritis   . Blood dyscrasia    Sickle Cell traits  . Blood transfusion without reported diagnosis 1999   prbc S/P COLON SURGERY  . Breast cancer, right breast ([place]) 10/03/12  . Cancer ([place])    colon,renal,lung  . Cataract    NO SURGERY  . Chronic kidney disease 1999   RIGHT KIDNEY REMOVED  . Colon cancer ([place]) 1999, 2013  . Depression   . Diabetes mellitus    diet controlled  . GERD (gastroesophageal reflux disease)   . H/O: lung cancer   . Heart murmur    . Hemorrhoids   . Hernia    Near umbilicus  . History of colon cancer   . History of kidney cancer   . Hot flashes    5 yr pill for breast CA causing hot flashes  . HX: breast cancer 2014   --right breast  . Hypertension    Does not see a cardiologist  . Interstitial lung disease ([place]) 11/25/2015  . Lung cancer ([place]) 2001  . Neuromuscular disorder ([place])    PERIPHERAL NEUROPATHY FEET  . Shortness of breath    with exertion   . Sickle cell trait ([place])   . Ulcer    HX YEARS AGO   Current Outpatient Prescriptions on File Prior to Visit  Medication Sig Dispense Refill  . Albuterol Sulfate (PROAIR RESPICLICK) 086 (90 Base) MCG/ACT AEPB Inhale 2 puffs into the lungs every 8 (eight) hours as needed. 1 each 0  . ALPRAZolam (XANAX) 1 MG tablet Take 0.5 mg by mouth at bedtime.     . Ascorbic Acid (VITAMIN C WITH ROSE HIPS) 500 MG tablet Take 500 mg by mouth every morning.     Marland Kitchen atorvastatin (LIPITOR) 20 MG tablet Take 20 mg by mouth daily before breakfast.     . Calcium Carbonate-Vitamin D (CALCIUM-VITAMIN D) 500-200 MG-UNIT per tablet Take 1 tablet by mouth 3 (three) times a week.     Marland Kitchen exemestane (AROMASIN) 25 MG tablet TAKE 1 TABLET BY MOUTH AFTER BREAKFAST 90 tablet  3  . Ferrous Gluconate (IRON) 240 (27 FE) MG TABS Take 1 tablet by mouth every morning.     . hydrochlorothiazide 25 MG tablet Take 25 mg by mouth daily before breakfast.     . ipratropium-albuterol (DUONEB) 0.5-2.5 (3) MG/3ML SOLN Take 3 mLs by nebulization 4 (four) times daily. Please bill through Medicare part B.  DX: J43.8 360 mL 5  . losartan (COZAAR) 50 MG tablet Take 50 mg by mouth daily.    . metFORMIN (GLUCOPHAGE) 500 MG tablet Take 500 mg by mouth daily with breakfast.     . metoprolol (LOPRESSOR) 100 MG tablet Take 100 mg by mouth daily before breakfast.     . Multiple Vitamin (MULTIVITAMIN WITH MINERALS) TABS Take 1 tablet by mouth every morning. She takes Dance movement psychotherapist Adult 50+.    Marland Kitchen omeprazole (PRILOSEC) 20  MG capsule Take 20 mg by mouth every morning.     Marland Kitchen PARoxetine (PAXIL) 40 MG tablet Take 40 mg by mouth daily before breakfast.     . traMADol (ULTRAM) 50 MG tablet Take 50 mg by mouth every 6 (six) hours as needed for pain.     Marland Kitchen umeclidinium bromide (INCRUSE ELLIPTA) 62.5 MCG/INH AEPB Inhale 1 puff into the lungs daily. 30 each 11   No current facility-administered medications on file prior to visit.      Review of Systems Constitutional:   No  weight loss, night sweats,  Fevers, chills,  +fatigue, or  lassitude.  HEENT:   No headaches,  Difficulty swallowing,  Tooth/dental problems, or  Sore throat,                No sneezing, itching, ear ache, nasal congestion, post nasal drip,   CV:  No chest pain,  Orthopnea, PND, swelling in lower extremities, anasarca, dizziness, palpitations, syncope.   GI  No heartburn, indigestion, abdominal pain, nausea, vomiting, diarrhea, change in bowel habits, loss of appetite, bloody stools.   Resp: No shortness of breath with exertion or at rest.  No excess mucus, no productive cough,  No non-productive cough,  No coughing up of blood.  No change in color of mucus.  No wheezing.  No chest wall deformity  Skin: no rash or lesions.  GU: no dysuria, change in color of urine, no urgency or frequency.  No flank pain, no hematuria   MS:  No joint pain or swelling.  No decreased range of motion.  No back pain.  Psych:  No change in mood or affect. No depression or anxiety.  No memory loss.         Objective:   Physical Exam Vitals:   04/22/16 0926  BP: 132/90  Pulse: 82  SpO2: 91%  Weight: 255 lb 6.4 oz (115.8 kg)  Height: '5\' 6"'$  (1.676 m)  Body mass index is 41.22 kg/m.  GEN: A/Ox3; pleasant , NAD, obese    HEENT:  [place]/AT,  EACs-clear, TMs-wnl, NOSE-clear, THROAT-clear, no lesions, no postnasal drip or exudate noted.   NECK:  Supple w/ fair ROM; no JVD; normal carotid impulses w/o bruits; no thyromegaly or nodules palpated; no  lymphadenopathy.    RESP  Clear  P & A; w/o, wheezes/ rales/ or rhonchi. no accessory muscle use, no dullness to percussion  CARD:  RRR, no m/r/g  , no peripheral edema, pulses intact, no cyanosis or clubbing.  GI:   Soft & nt; nml bowel sounds; no organomegaly or masses detected.   Musco: Warm bil, no deformities or  joint swelling noted.   Neuro: alert, no focal deficits noted.    Skin: Warm, no lesions or rashes  Enez Monahan NP-C  [facility]  04/22/2016         Assessment & Plan:

## 2016-04-22 NOTE — Patient Instructions (Addendum)
Decrease Albuterol /Ipratropium (Duoneb) Three times a day  .  Follow med calendar closely and bring to each visit.  Follow up Dr. Chase Caller in 2-3 months and As needed

## 2016-04-22 NOTE — Assessment & Plan Note (Signed)
incruse not covered by insurance  Cont on duoneb, lower the frequent   Plan  Patient Instructions  Decrease Albuterol /Ipratropium (Duoneb) Three times a day  .  Follow med calendar closely and bring to each visit.  Follow up Dr. Chase Caller in 2-3 months and As needed

## 2016-04-22 NOTE — Assessment & Plan Note (Signed)
Mild on Right heart cath 02/2016.  Continue on current regimen

## 2016-04-25 NOTE — Addendum Note (Signed)
Addended by: Osa Craver on: 04/25/2016 12:20 PM   Modules accepted: Orders

## 2016-04-29 ENCOUNTER — Ambulatory Visit: Payer: Medicare Other | Admitting: Internal Medicine

## 2016-05-16 ENCOUNTER — Telehealth: Payer: Self-pay | Admitting: Internal Medicine

## 2016-05-16 DIAGNOSIS — R252 Cramp and spasm: Secondary | ICD-10-CM

## 2016-05-16 DIAGNOSIS — C3491 Malignant neoplasm of unspecified part of right bronchus or lung: Secondary | ICD-10-CM

## 2016-05-16 NOTE — Telephone Encounter (Signed)
Called and left message for pt that MR will be able to answer her message tomorrow 05/17/16.

## 2016-05-16 NOTE — Telephone Encounter (Signed)
Spoke with pt. She is having issues with sore throat and leg cramping. Thinks this is coming from using Duoneb. Pt has been using Duoneb since September and did not have these symptoms when she started Duoneb. Advised her that it's likely that the medication is not causing these symptoms since she has been on the medication for over 1 month. Pt would like MR's recommendations.  MR - please advise. Thanks.

## 2016-05-17 NOTE — Telephone Encounter (Signed)
Pls tell Amy [name] that it is possible duoneb doing this. Please have her check bmet, mag, and phos. Also ask her if side effect tolerable compared to benefit?  Thanks  Dr. Brand Males, M.D., [facility].C.P Pulmonary and Critical Care Medicine Staff Physician [place] [facility] Pager: [phone], If no answer or between  15:00h - 7:00h: call [phone]  05/17/2016 8:30 AM

## 2016-05-17 NOTE — Telephone Encounter (Signed)
We will review labs and decide about cramps. Also get CBC with iron levels  Thanks  Dr. Brand Males, M.D., [facility].C.P Pulmonary and Critical Care Medicine Staff Physician [place] [facility] Pager: [phone], If no answer or between  15:00h - 7:00h: call [phone]  05/17/2016 8:45 AM

## 2016-05-17 NOTE — Telephone Encounter (Signed)
Spoke with pt. She is aware of MR's response. Order have been placed for blood work. She states that she feels like the Duoneb is helping but the leg cramping is giving her a "fit."

## 2016-05-19 NOTE — Telephone Encounter (Signed)
All labs have been entered.

## 2016-05-30 ENCOUNTER — Other Ambulatory Visit (INDEPENDENT_AMBULATORY_CARE_PROVIDER_SITE_OTHER): Payer: Medicare Other

## 2016-05-30 DIAGNOSIS — R252 Cramp and spasm: Secondary | ICD-10-CM

## 2016-05-30 DIAGNOSIS — C3491 Malignant neoplasm of unspecified part of right bronchus or lung: Secondary | ICD-10-CM | POA: Diagnosis not present

## 2016-05-30 LAB — ALKALINE PHOSPHATASE: ALK PHOS: 73 U/L (ref 39–117)

## 2016-05-30 LAB — BASIC METABOLIC PANEL
BUN: 19 mg/dL (ref 6–23)
CALCIUM: 10.1 mg/dL (ref 8.4–10.5)
CHLORIDE: 95 meq/L — AB (ref 96–112)
CO2: 40 meq/L — AB (ref 19–32)
CREATININE: 0.92 mg/dL (ref 0.40–1.20)
GFR: 77.54 mL/min (ref >60.00)
GLUCOSE: 134 mg/dL — AB (ref 70–99)
Potassium: 3.7 mEq/L (ref 3.5–5.1)
Sodium: 141 mEq/L (ref 135–145)

## 2016-05-30 LAB — CBC WITH DIFFERENTIAL/PLATELET
BASOS PCT: 0.7 % (ref 0.0–3.0)
Basophils Absolute: 0 10*3/uL (ref 0.0–0.1)
EOS PCT: 1.8 % (ref 0.0–5.0)
Eosinophils Absolute: 0.1 10*3/uL (ref 0.0–0.7)
HCT: 38 % (ref 36.0–46.0)
HEMOGLOBIN: 12.6 g/dL (ref 12.0–15.0)
LYMPHS ABS: 1.8 10*3/uL (ref 0.7–4.0)
Lymphocytes Relative: 30.6 % (ref 12.0–46.0)
MCHC: 33.1 g/dL (ref 30.0–36.0)
MCV: 87.5 fl (ref 78.0–100.0)
MONO ABS: 0.4 10*3/uL (ref 0.1–1.0)
MONOS PCT: 6.2 % (ref 3.0–12.0)
NEUTROS PCT: 60.7 % (ref 43.0–77.0)
Neutro Abs: 3.7 10*3/uL (ref 1.4–7.7)
Platelets: 168 10*3/uL (ref 150.0–400.0)
RBC: 4.34 Mil/uL (ref 3.87–5.11)
RDW: 15.1 % (ref 11.5–15.5)
WBC: 5.9 10*3/uL (ref 4.0–10.5)

## 2016-05-30 LAB — MAGNESIUM: Magnesium: 2 mg/dL (ref 1.5–2.5)

## 2016-05-30 LAB — IBC PANEL
IRON: 84 ug/dL (ref 42–145)
Saturation Ratios: 29.7 % (ref 20.0–50.0)
Transferrin: 202 mg/dL — ABNORMAL LOW (ref 212.0–360.0)

## 2016-06-06 ENCOUNTER — Telehealth: Payer: Self-pay | Admitting: Internal Medicine

## 2016-06-06 NOTE — Telephone Encounter (Signed)
Spoke with pt who is requesting her lab results.  Pt has also requested another medication that does not cause as many side affects as the duoneb. Pt states she has headaches, shakes & blurred vision which pt thinks is related to the duoneb. Pt states this was going on prior to stopping the duonebs until after her lab work was complete, but has noticed it more after resuming the duoneb.  MR please advise. Thanks.

## 2016-06-07 MED ORDER — GLYCOPYRROLATE-FORMOTEROL 9-4.8 MCG/ACT IN AERO: 2.0000 | INHALATION_SPRAY | Freq: Two times a day (BID) | RESPIRATORY_TRACT | 0 refills | Status: DC

## 2016-06-07 NOTE — Telephone Encounter (Signed)
   Labs ok . AGree that many side effects esp shaking due to duoneb. REason we went duoneb in sept was due to cost. She can try  BEVESPI via samples.   Also in review noticed that she retains co2 on abg aug 2017 and chemistries now- is she doing nocturnal bipap for copd?  I can see how Charolotte Eke is working when she returns 06/22/16  Thanks  Dr. Brand Males, M.D., [facility].C.P Pulmonary and Critical Care Medicine Staff Physician [place] [facility] Pager: [phone], If no answer or between  15:00h - 7:00h: call [phone]  06/07/2016 6:49 AM

## 2016-06-07 NOTE — Telephone Encounter (Signed)
Called and spoke to pt. Informed her of the recs per MR. Bevespi samples left up front for pick up. Pt states is not using nocturnal Bipap, only wearing 2lpm at night.   Will send to MR as FYI.

## 2016-06-07 NOTE — Telephone Encounter (Signed)
I will d/w patient  At fu about noct bipap  Dr. Brand Males, M.D., [facility].C.P Pulmonary and Critical Care Medicine Staff Physician [place] [facility] Pager: [phone], If no answer or between  15:00h - 7:00h: call [phone]  06/07/2016 11:56 PM

## 2016-06-13 ENCOUNTER — Telehealth: Payer: Self-pay | Admitting: Emergency Medicine

## 2016-06-13 ENCOUNTER — Telehealth: Payer: Self-pay | Admitting: Internal Medicine

## 2016-06-13 NOTE — Telephone Encounter (Signed)
I have demonstrated Bevespi for pt. Pt voiced understanding and had no further needed. Nothing further needed.

## 2016-06-13 NOTE — Telephone Encounter (Signed)
Opened in error

## 2016-06-22 ENCOUNTER — Ambulatory Visit: Payer: Medicare Other | Admitting: Internal Medicine

## 2016-07-13 ENCOUNTER — Other Ambulatory Visit: Payer: Self-pay

## 2016-07-13 MED ORDER — EXEMESTANE 25 MG PO TABS: ORAL_TABLET | ORAL | 3 refills | Status: DC

## 2016-07-19 ENCOUNTER — Telehealth: Payer: Self-pay | Admitting: Internal Medicine

## 2016-07-19 MED ORDER — BUDESONIDE-FORMOTEROL FUMARATE 160-4.5 MCG/ACT IN AERO: 2.0000 | INHALATION_SPRAY | Freq: Two times a day (BID) | RESPIRATORY_TRACT | 0 refills | Status: DC

## 2016-07-19 MED ORDER — BUDESONIDE-FORMOTEROL FUMARATE 160-4.5 MCG/ACT IN AERO: 2.0000 | INHALATION_SPRAY | Freq: Two times a day (BID) | RESPIRATORY_TRACT | 5 refills | Status: DC

## 2016-07-19 NOTE — Telephone Encounter (Signed)
Spoke with pt. She is having side effects from [place]. Reports blurred vision and hand tremors. Pt started taking Bevespi in November. Symptoms started over 1 week ago. Advised pt to not take anymore Bevespi until she hears back from [place].  MW - please advise as MR is working night shift. Thanks.

## 2016-07-19 NOTE — Telephone Encounter (Signed)
D/c bevespi and change to symbicort 160 Take 2 puffs first thing in am and then another 2 puffs about 12 hours later.

## 2016-07-19 NOTE — Telephone Encounter (Signed)
Spoke with pt. She is aware of MW's recommendation. Rx has been sent in. Nothing further was needed.

## 2016-07-19 NOTE — Telephone Encounter (Signed)
Spoke with pt, symbicort is going to cost her over $130/month, which she cannot afford.  Pt requesting an alternative inhaler.  Pt does not have a formulary.   Called pharmacy, states that the inhaler is in fact covered but because she is in the "dounut hole", her out of pocket responsibility will be roughly this amount for any inhaler until her insurance rolls over on 07/25/2016. Called and spoke with pt, aware of above.  Sample left up front for pt to use until after the new year when her insurance coverage rolls.  Nothing further needed at this time.

## 2016-07-26 ENCOUNTER — Telehealth: Payer: Self-pay | Admitting: Internal Medicine

## 2016-07-26 NOTE — Telephone Encounter (Signed)
Called and spoke with pt and she stated that she wanted to call and let MR know that she has stopped taking the symbicort.  She stated that she was having blurred vision, tremors came back after being on this for 2 days, nausea.  She stated that this medication helps her breathing but does not agree with her body.  She stated that she feels better off this med.  Wanted to see what MR recs are.  Please advise and pt was ok to wait until Thursday when MR is back in the office.

## 2016-07-27 NOTE — Telephone Encounter (Signed)
She has reported tremors and blurred visition for duoneb, bevespi and symbicort. In addition she has many allergies. She can do an anticholoinergic LAMA scheduled till her OV 08/13/15 with albterol prn. Pls give her sample of either spiriva, or incruse or tudorza and we will see  How this works for her   Thanks  Dr. Brand Males, M.D., [facility].C.P Pulmonary and Critical Care Medicine Staff Physician [place] [facility] Pager: [phone], If no answer or between  15:00h - 7:00h: call [phone]  07/27/2016 8:35 AM      Allergies  Allergen Reactions  . Crab [Shellfish Allergy] Anaphylaxis  . Iodine Anaphylaxis  . Iohexol      Code: HIVES, Desc: pt states , swelling mouth , throat, and has difficulty breathing   . Lactose Intolerance (Gi)   . Adhesive [Tape] Rash  . Latex Itching and Rash     Current Outpatient Prescriptions:  .  Albuterol Sulfate (PROAIR RESPICLICK) 098 (90 Base) MCG/ACT AEPB, Inhale 2 puffs into the lungs every 8 (eight) hours as needed. (Patient taking differently: Inhale 2 puffs into the lungs every 4 (four) hours as needed. ), Disp: 1 each, Rfl: 0 .  ALPRAZolam (XANAX) 1 MG tablet, Take 0.5 mg by mouth 2 (two) times daily. , Disp: , Rfl:  .  Ascorbic Acid (VITAMIN C WITH ROSE HIPS) 500 MG tablet, Take 500 mg by mouth every morning. , Disp: , Rfl:  .  atorvastatin (LIPITOR) 20 MG tablet, Take 20 mg by mouth daily before breakfast. , Disp: , Rfl:  .  budesonide-formoterol (SYMBICORT) 160-4.5 MCG/ACT inhaler, Inhale 2 puffs into the lungs 2 (two) times daily., Disp: 1 Inhaler, Rfl: 5 .  budesonide-formoterol (SYMBICORT) 160-4.5 MCG/ACT inhaler, Inhale 2 puffs into the lungs 2 (two) times daily., Disp: 1 Inhaler, Rfl: 0 .  Calcium Carbonate-Vitamin D (CALCIUM-VITAMIN D) 500-200 MG-UNIT per tablet, Take 1 tablet by mouth Monday, Wednesday, and Friday, Disp: , Rfl:  .  exemestane (AROMASIN) 25 MG tablet, TAKE 1 TABLET BY MOUTH  AFTER BREAKFAST, Disp: 90 tablet, Rfl: 3 .  Ferrous Gluconate (IRON) 240 (27 FE) MG TABS, Take 1 tablet by mouth every morning. , Disp: , Rfl:  .  hydrochlorothiazide 25 MG tablet, Take 25 mg by mouth daily before breakfast. , Disp: , Rfl:  .  ipratropium-albuterol (DUONEB) 0.5-2.5 (3) MG/3ML SOLN, Take 3 mLs by nebulization 4 (four) times daily. Please bill through Medicare part B.  DX: J43.8 (Patient taking differently: Take 3 mLs by nebulization 3 (three) times daily. Please bill through Medicare part B.  DX: J43.8), Disp: 360 mL, Rfl: 5 .  losartan (COZAAR) 50 MG tablet, Take 50 mg by mouth daily., Disp: , Rfl:  .  metFORMIN (GLUCOPHAGE) 500 MG tablet, Take 500 mg by mouth daily with breakfast. , Disp: , Rfl:  .  metoprolol (LOPRESSOR) 100 MG tablet, Take 100 mg by mouth daily before breakfast. , Disp: , Rfl:  .  Multiple Vitamin (MULTIVITAMIN WITH MINERALS) TABS, Take 1 tablet by mouth every morning. She takes Dance movement psychotherapist Adult 50+., Disp: , Rfl:  .  omeprazole (PRILOSEC) 20 MG capsule, Take 20 mg by mouth every morning. , Disp: , Rfl:  .  OXYGEN, Inhale into the lungs. Use 2L continuously, Disp: , Rfl:  .  PARoxetine (PAXIL) 40 MG tablet, Take 40 mg by mouth daily before breakfast. , Disp: , Rfl:  .  traMADol (ULTRAM) 50 MG tablet, Take  50 mg by mouth 2 (two) times daily. , Disp: , Rfl:  ]   has a past medical history of Allergy; Anemia; Anxiety; Arthritis; Blood dyscrasia; Blood transfusion without reported diagnosis (1999); Breast cancer, right breast ([place]) (10/03/12); Cancer ([place]); Cataract; Chronic kidney disease (1999); Colon cancer ([place]) (1999, 2013); Depression; Diabetes mellitus; GERD (gastroesophageal reflux disease); H/O: lung cancer; Heart murmur; Hemorrhoids; Hernia; History of colon cancer; History of kidney cancer; Hot flashes; breast cancer (2014); Hypertension; Interstitial lung disease ([place]) (11/25/2015); Lung cancer ([place]) (2001); Neuromuscular disorder ([place]); Shortness of  breath; Sickle cell trait ([place]); and Ulcer ([place]).

## 2016-07-27 NOTE — Telephone Encounter (Signed)
Spoke with pt. She is aware of MR's response. Samples of Incruse have been left up front. Pt knows that she will have to have someone show her how to use this. Nothing further was needed.

## 2016-08-01 ENCOUNTER — Encounter: Payer: Self-pay | Admitting: Hematology and Oncology

## 2016-08-01 NOTE — Progress Notes (Signed)
Gave physician form for PAF to RN to have physician complete and return to me to be faxed.

## 2016-08-02 ENCOUNTER — Encounter: Payer: Self-pay | Admitting: Hematology and Oncology

## 2016-08-02 NOTE — Progress Notes (Signed)
Received signed physician form for PAF from RN. Faxed to PAF. Fax received ok per confirmation sheet.

## 2016-08-08 MED FILL — EXEMESTANE 25 MG TABLET: 25 | 30 days supply | Qty: 30 | Fill #1

## 2016-08-09 ENCOUNTER — Encounter: Payer: Self-pay | Admitting: Hematology and Oncology

## 2016-08-09 NOTE — Progress Notes (Signed)
Followed up on status of PAF per request of Bryan(pharmacy). Per PAF portal, issue has been resolved and approval is good 05/03/16 through 07/02/17 with a look-back date of 11/05/15.  Pharmacy [phone] [facility] [id number] PCN [id number] [phone]  Called patient to advise of update.

## 2016-08-12 ENCOUNTER — Ambulatory Visit: Payer: Medicare Other | Admitting: Internal Medicine

## 2016-08-19 ENCOUNTER — Telehealth: Payer: Self-pay | Admitting: Internal Medicine

## 2016-08-19 NOTE — Telephone Encounter (Signed)
Give her trelegy sample and she can stop symbicort and incruse till she can pay for her incruse   Dr. Brand Males, M.D., [facility].C.P Pulmonary and Critical Care Medicine Staff Physician [place] [facility] Pager: [phone], If no answer or between  15:00h - 7:00h: call [phone]  08/19/2016 5:18 PM

## 2016-08-19 NOTE — Telephone Encounter (Signed)
Pt aware that we do not currently have any incruse samples. I have offered to send in a RX. Pt states she does not have 45 dollars for the co pay. Pt asked if there is a alternative to incurse that we may have a sample of. Pt is scheduled to f/u with MR on 09-07-16. Pt's 08-12-16 appointment was canceled due to the recent weather.  MR please advise. Thanks.

## 2016-08-19 NOTE — Telephone Encounter (Signed)
MR pt states she would like a powder because she does well with them. Spiriva comes in two ways, which one do you prefer? Please clarify.

## 2016-08-19 NOTE — Telephone Encounter (Signed)
She can try samples of spiriva or tudorza   Allergies  Allergen Reactions  . Crab [Shellfish Allergy] Anaphylaxis  . Iodine Anaphylaxis  . Iohexol      Code: HIVES, Desc: pt states , swelling mouth , throat, and has difficulty breathing   . Lactose Intolerance (Gi)   . Adhesive [Tape] Rash  . Latex Itching and Rash

## 2016-08-19 NOTE — Telephone Encounter (Signed)
Called and spoke with pt and she is aware of MR recs and she stated that she does not have the money for a co-pay for the inhaler and we do not have samples of the handi-haler.  Pt is wanting to know if there are any pill form of medication that she can use.  She stated that when she uses her rescue inhaler, this makes her feel much better.  MR please advise. thanks

## 2016-08-19 NOTE — Telephone Encounter (Signed)
Regular dpi spiriva (not respimat)

## 2016-08-22 NOTE — Telephone Encounter (Signed)
lmtcb x1 for pt. 

## 2016-08-23 MED ORDER — FLUTICASONE-UMECLIDIN-VILANT 100-62.5-25 MCG/INH IN AEPB: 1.0000 | INHALATION_SPRAY | Freq: Every day | RESPIRATORY_TRACT | 0 refills | Status: DC

## 2016-08-23 NOTE — Telephone Encounter (Signed)
Spoke with pt, aware of recs.  Will pick up trelegy samples.  Nothing further needed at this time.

## 2016-08-24 ENCOUNTER — Encounter: Payer: Self-pay | Admitting: Gastroenterology

## 2016-08-26 ENCOUNTER — Telehealth: Payer: Self-pay | Admitting: Internal Medicine

## 2016-08-26 NOTE — Telephone Encounter (Signed)
Pt shown how to use trelegy inhaler. Pt voiced her understanding and had no further questions. Nothing further needed.

## 2016-08-31 MED FILL — EXEMESTANE 25 MG TABLET: 25 | 90 days supply | Qty: 90 | Fill #0

## 2016-09-07 ENCOUNTER — Encounter: Payer: Self-pay | Admitting: Internal Medicine

## 2016-09-07 ENCOUNTER — Ambulatory Visit (INDEPENDENT_AMBULATORY_CARE_PROVIDER_SITE_OTHER): Payer: Medicare Other | Admitting: Internal Medicine

## 2016-09-07 VITALS — BP 138/90 | HR 76 | Ht 66.0 in | Wt 264.0 lb

## 2016-09-07 DIAGNOSIS — J9611 Chronic respiratory failure with hypoxia: Secondary | ICD-10-CM

## 2016-09-07 DIAGNOSIS — J438 Other emphysema: Secondary | ICD-10-CM

## 2016-09-07 DIAGNOSIS — I272 Pulmonary hypertension, unspecified: Secondary | ICD-10-CM

## 2016-09-07 DIAGNOSIS — Z902 Acquired absence of lung [part of]: Secondary | ICD-10-CM

## 2016-09-07 DIAGNOSIS — J849 Interstitial pulmonary disease, unspecified: Secondary | ICD-10-CM | POA: Diagnosis not present

## 2016-09-07 NOTE — Progress Notes (Signed)
Subjective:     Patient ID: Amy [name], female   DOB: 07/27/1945, 71 y.o.   MRN: [id number]  HPI   IOV 10/27/2015  Chief Complaint  Patient presents with  . Pulmonary Consult    Pt self referral for DOE x 1 year. Pt c/o non prod cough with chest congestion x 1 week.     71 year-old morbidly obese female, previous history of 26 pack smoking quit 2 years ago. Also multiple cancer history and cancer survivor was reportedly in complete remission. Last CT scan of the chest in 2013 without any evidence of cancer in the lung. Followed by primary care physician. She reports insidious onset of shortness of breath for the last 1 year. Progressive only somewhat. Overall stable. Moderate in intensity. Ranging clothes walking one flight of stairs or doing groceries brings on her dyspnea. It is relieved by rest. For the last 2 weeks it is associated with cough which is dry but she does not feel sick. There is no fever or hemoptysis or orthopnea paroxysmal nocturnal dyspnea. She is morbidly obese with a BMI greater than 40 but it is unchanged. There is a history of mild sleep apnea on a sleep study per her history 10 years ago.  Walking desaturation test 180 5. 3 laps on room air with a full head probe: She walk only 30 feet and she desaturated to 85%. We walked her final feet and she desaturated to 81%. She improved a pulse ox greater than 92% at rest. She declined any November the oxygen therapy.   Ct chest oct 2013 - personally visualized IMPRESSION:  1. Surgical changes on the right. No evidence of recurrent or metastatic disease. 2. Mild centrilobular emphysema. 3. Pulmonary artery enlargement suggests pulmonary arterial hypertension. 4. Esophageal air fluid level suggests dysmotility or gastroesophageal reflux.   Original Report Authenticated By: Areta Haber, M.D.   OV sept 6257   71 year old female former smoker with COPD with emphysema, lung cancer status post right lobectomy  in 2001. 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 2. There also appears to be some degree of associated mucoid impaction within terminal bronchioles throughout the right lower lobe. 3. Mild diffuse bronchial wall thickening with mild centrilobular emphysema; imaging findings suggestive of underlying COPD. 4. Dilatation of the pulmonic trunk (4.1 cm in diameter), suggesting pulmonary arterial hypertension.  04/22/2016 Follow up : COPD/ILD  Patient presents for a two-month follow-up and medication review. Patient was previously on Incruse.  Incruse was not covered by insurance, changed to [employer] Four times a day  .  Says it causes some jitteriness if she does Four times a day  , better on Three times a day  .  Flu shot is utd.  Patient underwent a right heart catheter 03/02/2016 that showed mild pulmonary hypertension with normal PVR.  Says overall she is doing okay. Does get winded with activity that oxygen seems to help. She denies any chest pain, orthopnea, PND, or increased leg swelling.  OV 09/07/2016  Chief Complaint  Patient presents with  . Follow-up    Pt states her SOB is at baseline. Pt denies cough and CP/tightness. Pt states she has changed inhalers so many times d/t side effects.    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 She is overall doing well. She has difficulty affording inhalers but the nebulizes causes side effects. Most recently we had to give her a sample of the new [employer] branded inhaler TRELEGY she says this works really well for her. Side effect profile is minimal. Dyspnea is better. There no new acute  issues. She is up-to-date with her flu shot. She seems a little bit unaware of the ILD findings on her CT chest    has a past medical history of Allergy; Anemia; Anxiety; Arthritis; Blood dyscrasia; Blood transfusion without reported diagnosis (1999); Breast cancer, right breast ([place]) (10/03/12); Cancer ([place]); Cataract; Chronic kidney disease (1999); Colon cancer ([place]) (1999, 2013); Depression; Diabetes mellitus; GERD (gastroesophageal reflux disease); H/O: lung cancer; Heart murmur; Hemorrhoids; Hernia; History of colon cancer; History of kidney cancer; Hot flashes; breast cancer (2014); Hypertension; Interstitial lung disease ([place]) (11/25/2015); Lung cancer ([place]) (2001); Neuromuscular disorder ([place]); Shortness of breath; Sickle cell trait ([place]); and Ulcer ([place]).   reports that she quit smoking about 2 years ago. Her smoking use included Cigarettes. She started smoking about 23 years ago. She has a 26.25 pack-year smoking history. She has never used smokeless tobacco.  Past Surgical History:  Procedure Laterality Date  . ABDOMINAL HYSTERECTOMY  80's   fibroids  . BREAST BIOPSY     Bilateral cysts/benign  . BREAST BIOPSY Right 10/03/12   ER/PR +  . BREAST BIOPSY Right 12/20/2012   Procedure: Right breast Excisional Biopsy;  Surgeon: Rolm Bookbinder, MD;  Location: [facility];  Service: General;  Laterality: Right;  . BREAST LUMPECTOMY WITH NEEDLE LOCALIZATION Right 12/20/2012   Procedure: BREAST LUMPECTOMY WITH NEEDLE LOCALIZATION;  Surgeon: Rolm Bookbinder, MD;  Location: [facility];  Service: General;  Laterality: Right;  . BREAST LUMPECTOMY WITH RADIOACTIVE SEED LOCALIZATION Right 01/19/2015   Procedure: RIGHT BREAST LUMPECTOMY WITH RADIOACTIVE SEED LOCALIZATION;  Surgeon: Rolm Bookbinder, MD;  Location: [facility];  Service: General;  Laterality: Right;  . CARDIAC CATHETERIZATION N/A 03/02/2016   Procedure: Right Heart Cath;  Surgeon: Jolaine Artist, MD;  Location: [facility];   Service: Cardiovascular;  Laterality: N/A;  . COLECTOMY  05-31-2012   laparoscopic ,possible open  . COLON SURGERY     chemo  . COLONOSCOPY    . [place]   right/chemo kidney removed for cancer  . LUNG LOBECTOMY     right lower  . PARTIAL COLECTOMY  05/31/2012   Procedure: PARTIAL COLECTOMY;  Surgeon: Rolm Bookbinder, MD;  Location: [facility];  Service: General;  Laterality: N/A;  . PARTIAL HYSTERECTOMY    . POLYPECTOMY    . RE-EXCISION OF BREAST CANCER,SUPERIOR MARGINS Right 01/21/2013   Procedure: RE-EXCISION OF BREAST CANCER,SUPERIOR MARGINS;  Surgeon: Rolm Bookbinder, MD;  Location: [facility];  Service: General;  Laterality: Right;  . TONSILLECTOMY     as child  . TUBAL LIGATION      Allergies  Allergen Reactions  . Crab [Shellfish Allergy] Anaphylaxis  . Iodine Anaphylaxis  . Iohexol      Code: HIVES, Desc: pt states , swelling mouth , throat, and has difficulty breathing   . Lactose Intolerance (Gi)   . Adhesive [Tape] Rash  . Latex Itching and Rash    Immunization History  Administered Date(s) Administered  . Influenza, High Dose Seasonal PF 04/18/2016  . Influenza,inj,Quad PF,36+ Mos 08/26/2015  . Pneumococcal Conjugate-13 08/26/2015    Family History  Problem Relation Age of Onset  . Colon polyps Brother   . Multiple myeloma Brother 77  . Breast cancer Mother     dx in her 38s  . Heart disease Father   . Esophageal cancer Neg Hx   . Rectal cancer Neg Hx   . Stomach cancer Neg Hx   . Colon cancer Paternal Aunt   . Breast cancer Maternal Grandmother   . Colon cancer Cousin     2 paternal first cousins  . Leukemia Cousin 11     Current Outpatient Prescriptions:  .  Albuterol Sulfate (PROAIR RESPICLICK) 704 (90 Base) MCG/ACT AEPB, Inhale 2 puffs into the lungs every 8 (eight) hours as needed. (Patient taking differently: Inhale 2 puffs into the lungs every 4 (four) hours as needed. ), Disp: 1 each, Rfl: 0 .  ALPRAZolam (XANAX) 1 MG tablet, Take  0.5 mg by mouth 2 (two) times daily. , Disp: , Rfl:  .  Ascorbic Acid (VITAMIN C WITH ROSE HIPS) 500 MG tablet, Take 500 mg by mouth every morning. , Disp: , Rfl:  .  atorvastatin (LIPITOR) 20 MG tablet, Take 20 mg by mouth daily before breakfast. , Disp: , Rfl:  .  Calcium Carbonate-Vitamin D (CALCIUM-VITAMIN D) 500-200 MG-UNIT per tablet, Take 1 tablet by mouth Monday, Wednesday, and Friday, Disp: , Rfl:  .  exemestane (AROMASIN) 25 MG tablet, TAKE 1 TABLET BY MOUTH AFTER BREAKFAST, Disp: 90 tablet, Rfl: 3 .  Ferrous Gluconate (IRON) 240 (27 FE) MG TABS, Take 1 tablet by mouth every morning. , Disp: , Rfl:  .  Fluticasone-Umeclidin-Vilant (TRELEGY ELLIPTA) 100-62.5-25 MCG/INH AEPB, Inhale 1 puff into the lungs daily., Disp: 1 each, Rfl: 0 .  hydrochlorothiazide 25 MG tablet, Take 25 mg by mouth daily before breakfast. , Disp: , Rfl:  .  losartan (COZAAR) 50 MG tablet, Take 50 mg by mouth daily., Disp: , Rfl:  .  metFORMIN (GLUCOPHAGE) 500 MG tablet, Take 500 mg by mouth daily with breakfast. , Disp: , Rfl:  .  metoprolol (LOPRESSOR) 100 MG tablet, Take 100 mg by mouth daily before breakfast. , Disp: , Rfl:  .  omeprazole (PRILOSEC) 20 MG capsule, Take 20 mg by mouth every morning. , Disp: , Rfl:  .  OXYGEN, Inhale into the lungs. Use 2L continuously, Disp: , Rfl:  .  PARoxetine (PAXIL) 40 MG tablet, Take 40 mg by mouth daily before breakfast. , Disp: , Rfl:  .  traMADol (ULTRAM) 50 MG tablet, Take 50 mg by mouth 2 (two) times daily. , Disp: , Rfl:  .  Multiple Vitamin (MULTIVITAMIN WITH MINERALS) TABS, Take 1 tablet by mouth every morning. She takes Dance movement psychotherapist Adult 50+., Disp: , Rfl:    Review of Systems     Objective:   Physical Exam  Constitutional: She is oriented to person, place, and time. She appears well-developed and well-nourished. No distress.  HENT:  Head: Normocephalic and atraumatic.  Right Ear: External ear normal.  Left Ear: External ear normal.  Mouth/Throat:  Oropharynx is clear and moist. No oropharyngeal exudate.  Eyes: Conjunctivae and EOM are normal. Pupils are equal, round, and reactive to light. Right eye exhibits no discharge. Left eye exhibits no discharge. No scleral icterus.  Neck: Normal range of motion. Neck supple. No JVD present. No tracheal deviation present. No thyromegaly present.  Cardiovascular: Normal rate, regular rhythm, normal heart sounds and intact distal pulses.  Exam reveals no gallop and no friction rub.   No murmur  heard. Pulmonary/Chest: Effort normal. No respiratory distress. She has no wheezes. She has rales. She exhibits no tenderness.  Abdominal: Soft. Bowel sounds are normal. She exhibits no distension and no mass. There is no tenderness. There is no rebound and no guarding.  Musculoskeletal: Normal range of motion. She exhibits no edema or tenderness.  Lymphadenopathy:    She has no cervical adenopathy.  Neurological: She is alert and oriented to person, place, and time. She has normal reflexes. No cranial nerve deficit. She exhibits normal muscle tone. Coordination normal.  Skin: Skin is warm and dry. No rash noted. She is not diaphoretic. No erythema. No pallor.  Psychiatric: She has a normal mood and affect. Her behavior is normal. Judgment and thought content normal.  Vitals reviewed.  Vitals:   09/07/16 1120  BP: 138/90  Pulse: 76  SpO2: 98%  Weight: 264 lb (119.7 kg)  Height: 5' 6"  (1.676 m)    Estimated body mass index is 42.61 kg/m as calculated from the following:   Height as of this encounter: 5' 6"  (1.676 m).   Weight as of this encounter: 264 lb (119.7 kg).      Assessment:       ICD-9-CM ICD-10-CM   1. Chronic respiratory failure with hypoxia (HCC) 518.83 J96.11    799.02    2. Other emphysema ([place]) 492.8 J43.8   3. Interstitial lung disease ([place]) 515 J84.9   4. Pulmonary hypertension 416.8 I27.20   5. Morbid obesity, unspecified obesity type ([place]) 278.01 E66.01   6. History of  lobectomy of lung V12.59 Z90.2        Plan:      Glad you are stable Your shortness of breath is  Due to all of above  Plan  - trelegy sample or incruse/tudorza + symbicort samples - o2 24/7 as before - do HRCT wot contrast in June 2018   followup - June 2018 after HRCT   Dr. Brand Males, M.D., [facility].C.P Pulmonary and Critical Care Medicine Staff Physician [facility] Pager: [phone], If no answer or between  15:00h - 7:00h: call [phone]  09/07/2016 11:59 AM

## 2016-09-07 NOTE — Patient Instructions (Signed)
ICD-9-CM ICD-10-CM   1. Chronic respiratory failure with hypoxia (HCC) 518.83 J96.11    799.02    2. Other emphysema ([place]) 492.8 J43.8   3. Interstitial lung disease ([place]) 515 J84.9   4. Pulmonary hypertension 416.8 I27.20   5. Morbid obesity, unspecified obesity type ([place]) 278.01 E66.01   6. History of lobectomy of lung V12.59 Z90.2     Glad you are stable Your shortness of breath is  Due to all of above  Plan  - trelegy sample or incruse/tudorza + symbicort samples - o2 24/7 as before - do HRCT wot contrast in June 2018   followup - June 2018 after HRCT

## 2016-09-19 ENCOUNTER — Encounter: Payer: Self-pay | Admitting: *Deleted

## 2016-09-21 ENCOUNTER — Telehealth: Payer: Self-pay | Admitting: Internal Medicine

## 2016-09-21 NOTE — Telephone Encounter (Signed)
Spoke with pt. She is requesting samples of Trelegy. Samples have been left up front for pick up. Nothing further was needed. 

## 2016-09-22 ENCOUNTER — Encounter: Payer: Self-pay | Admitting: Physician Assistant

## 2016-09-22 ENCOUNTER — Ambulatory Visit (INDEPENDENT_AMBULATORY_CARE_PROVIDER_SITE_OTHER): Payer: Medicare Other | Admitting: Physician Assistant

## 2016-09-22 VITALS — BP 130/94 | HR 80 | Ht 65.0 in | Wt 258.1 lb

## 2016-09-22 DIAGNOSIS — J9611 Chronic respiratory failure with hypoxia: Secondary | ICD-10-CM

## 2016-09-22 DIAGNOSIS — Z85038 Personal history of other malignant neoplasm of large intestine: Secondary | ICD-10-CM | POA: Diagnosis not present

## 2016-09-22 DIAGNOSIS — Z9981 Dependence on supplemental oxygen: Secondary | ICD-10-CM | POA: Diagnosis not present

## 2016-09-22 MED ORDER — NA SULFATE-K SULFATE-MG SULF 17.5-3.13-1.6 GM/177ML PO SOLN: ORAL | 0 refills | Status: DC

## 2016-09-22 NOTE — Progress Notes (Signed)
Reviewed and agree with management plan.  Malcolm T. Stark, MD FACG 

## 2016-09-22 NOTE — Progress Notes (Signed)
Subjective:    Patient ID: Amy [name], female    DOB: 08-29-1945, 71 y.o.   MRN: [id number]  HPI; Amy [name] is a pleasant 71 year old African-American female known to Dr. Fuller Plan who has history of recurrent adenocarcinoma of the colon. She was initially diagnosed in 1999 with a T3 N1 lesion and had right hemicolectomy. She then had recurrence in 2013 with a T2 N0 lesion in the transverse colon and underwent upper stop it partial colectomy with Dr. Donne Hazel. She has had serial follow-up with last colonoscopy in March 2016 showing a 6 mm polyp in the sigmoid colon and 3 other small polyps measuring 4-5 mm also removed all were hyperplastic had a patent colocolonic surgical anastomosis in the transverse colon. She was recommended for one year interval follow-up. She has multiple other comorbidities including pulmonary hypertension, COPD and chronic respiratory failure , for which she is now on chronic oxygen at 2 L nasal cannula since April 2017. She also has history of breast cancer and underwent a right lumpectomy in 2014, history of renal cell CA status post post-right nephrectomy and history of non-small cell lung cancer status post previous partial lobectomy. He has no current GI complaints, comes in to schedule follow-up colonoscopy. She denies any abdominal pain, changes with her bowel habits though says she does have occasional diarrhea no melena or hematochezia. Says she has been stable from a pulmonary standpoint and feels that the oxygen is helped her quite a bit.   Review of Systems Pertinent positive and negative review of systems were noted in the above HPI section.  All other review of systems was otherwise negative.  Outpatient Encounter Prescriptions as of 09/22/2016  Medication Sig  . Albuterol Sulfate (PROAIR RESPICLICK) 144 (90 Base) MCG/ACT AEPB Inhale 2 puffs into the lungs every 8 (eight) hours as needed. (Patient taking differently: Inhale 2 puffs into the lungs every 4 (four)  hours as needed. )  . ALPRAZolam (XANAX) 1 MG tablet Take 0.5 mg by mouth 2 (two) times daily.   . Ascorbic Acid (VITAMIN C WITH ROSE HIPS) 500 MG tablet Take 500 mg by mouth every morning.   Marland Kitchen atorvastatin (LIPITOR) 20 MG tablet Take 20 mg by mouth daily before breakfast.   . Calcium Carbonate-Vitamin D (CALCIUM-VITAMIN D) 500-200 MG-UNIT per tablet Take 1 tablet by mouth Monday, Wednesday, and Friday  . exemestane (AROMASIN) 25 MG tablet TAKE 1 TABLET BY MOUTH AFTER BREAKFAST  . Ferrous Gluconate (IRON) 240 (27 FE) MG TABS Take 1 tablet by mouth every morning.   . Fluticasone-Umeclidin-Vilant (TRELEGY ELLIPTA) 100-62.5-25 MCG/INH AEPB Inhale 1 puff into the lungs daily.  . hydrochlorothiazide 25 MG tablet Take 25 mg by mouth daily before breakfast.   . losartan (COZAAR) 50 MG tablet Take 50 mg by mouth daily.  . metFORMIN (GLUCOPHAGE) 500 MG tablet Take 500 mg by mouth daily with breakfast.   . metoprolol (LOPRESSOR) 100 MG tablet Take 100 mg by mouth daily before breakfast.   . Multiple Vitamin (MULTIVITAMIN WITH MINERALS) TABS Take 1 tablet by mouth every morning. She takes Dance movement psychotherapist Adult 50+.  Marland Kitchen omeprazole (PRILOSEC) 20 MG capsule Take 20 mg by mouth every morning.   . OXYGEN Inhale into the lungs. Use 2L continuously  . PARoxetine (PAXIL) 40 MG tablet Take 40 mg by mouth daily before breakfast.   . traMADol (ULTRAM) 50 MG tablet Take 50 mg by mouth 2 (two) times daily.   . Na Sulfate-K Sulfate-Mg Sulf 17.5-3.13-1.6 GM/180ML SOLN Suprep-Use  as directed  . [DISCONTINUED] Na Sulfate-K Sulfate-Mg Sulf 17.5-3.13-1.6 GM/180ML SOLN Suprep-Use as directed   No facility-administered encounter medications on file as of 09/22/2016.    Allergies  Allergen Reactions  . Crab [Shellfish Allergy] Anaphylaxis  . Iodine Anaphylaxis  . Iohexol      Code: HIVES, Desc: pt states , swelling mouth , throat, and has difficulty breathing   . Lactose Intolerance (Gi)   . Adhesive [Tape] Rash  . Latex  Itching and Rash   Patient Active Problem List   Diagnosis Date Noted  . Interstitial lung disease ([place]) 11/25/2015  . Smoking history 10/27/2015  . Other emphysema ([place]) 10/27/2015  . Shortness of breath 10/27/2015  . Chronic cough 10/27/2015  . Pulmonary hypertension 10/27/2015  . Atypical lobular hyperplasia of breast 12/09/2014  . History of colon cancer 09/24/2014  . History of lung cancer 09/24/2014  . History of kidney cancer 09/24/2014  . Breast cancer of upper-outer quadrant of right female breast ([place])   . Colon cancer ([place])   . Incisional hernia 06/02/2012  . Non-small cell cancer of right lung ([place]) 04/20/2012  . Renal cell cancer ([place]) 04/20/2012  . COLONIC POLYPS, ADENOMATOUS 12/04/2007   Social History   Social History  . Marital status: Widowed    Spouse name: N/A  . Number of children: N/A  . Years of education: N/A   Occupational History  . retired    Social History Main Topics  . Smoking status: Former Smoker    Packs/day: 1.25    Years: 21.00    Types: Cigarettes    Start date: 10/30/1992    Quit date: 11/20/2013  . Smokeless tobacco: Never Used     Comment: quit then started up again 2years ago--smokes 1pack every 2-3 days  . Alcohol use No  . Drug use: No  . Sexual activity: No     Comment: TAH--ovaries retained   Other Topics Concern  . Not on file   Social History Narrative  . No narrative on file    Amy [name]'s family history includes Breast cancer in her maternal grandmother and mother; Colon cancer in her cousin and paternal aunt; Colon polyps in her brother; Heart disease in her father; Leukemia (age of onset: 34) in her cousin; Multiple myeloma (age of onset: 53) in her brother.      Objective:    Vitals:   09/22/16 1436  BP: (!) 130/94  Pulse: 80    Physical Exam well-developed older African-American female in no acute distress, pleasant, on nasal O2 2 L. Blood pressure 130/94, pulse 80, height 5 foot 5, weight 258, BMI  42.9. HEENT ;nontraumatic normocephalic EOMI PERRLA sclera anicteric, Cardiovascular ;regular rate and rhythm with S1-S2 no murmur or gallop, Pulmonary; somewhat decreased breath sounds bilaterally, Abdomen; obese soft nontender nondistended bowel sounds are active is no palpable mass or hepatosplenomegaly, Extremities; no clubbing cyanosis or edema skin warm and dry, Neuropsych ;mood and affect appropriate       Assessment & Plan:   #72  71 year old female with history of recurrent adenocarcinoma of the colon, initially diagnosed 1999 with T3 N1 right-sided colon cancer status post right hemicolectomy and then had recurrence in 2013 with a T2 N0 lesion transverse colon and underwent additional partial colectomy. Last colonoscopy March 2016 with several hyperplastic polyps removed Overdue for follow-up colonoscopy  #2 COPD/chronic respiratory failure now on chronic nasal O2 at 2 L #3 pulmonary hypertension #4 history of non-small cell lung cancer status post partial  right lobectomy #5 history of renal cell CA status post right nephrectomy #6 history of breast cancer status post right lumpectomy 2014 #7 morbid obesity BMI 42  Plan; Patient will be scheduled for colonoscopy with Dr. Fuller Plan at [facility] due to oxygen requirement. Procedure was discussed in detail with patient including risks and benefits and she is agreeable to proceed.     Sarah Baez S Aizik Reh PA-C 09/22/2016   Cc: Seward Carol, MD

## 2016-09-22 NOTE — Patient Instructions (Signed)
You have been scheduled for a colonoscopy. Please follow written instructions given to you at your visit today.  Please pick up your prep supplies at the pharmacy within the next 1-3 days. If you use inhalers (even only as needed), please bring them with you on the day of your procedure. Your physician has requested that you go to www.startemmi.com and enter the access code given to you at your visit today. This web site gives a general overview about your procedure. However, you should still follow specific instructions given to you by our office regarding your preparation for the procedure.  If you are age 52 or older, your body mass index should be between 23-30. Your Body mass index is 42.95 kg/m. If this is out of the aforementioned range listed, please consider follow up with your Primary Care Provider.  If you are age 64 or younger, your body mass index should be between 19-25. Your Body mass index is 42.95 kg/m. If this is out of the aformentioned range listed, please consider follow up with your Primary Care Provider.

## 2016-09-23 ENCOUNTER — Telehealth: Payer: Self-pay | Admitting: Gastroenterology

## 2016-09-23 NOTE — Telephone Encounter (Signed)
Patient doesn't have a ride.  She will call back in a few weeks to reschedule when the new schedule comes out. Appt cancelled with Sharee Pimple at [facility]

## 2016-10-17 ENCOUNTER — Telehealth: Payer: Self-pay | Admitting: Internal Medicine

## 2016-10-17 MED ORDER — UMECLIDINIUM BROMIDE 62.5 MCG/INH IN AEPB: 1.0000 | INHALATION_SPRAY | Freq: Every day | RESPIRATORY_TRACT | 0 refills | Status: DC

## 2016-10-17 MED ORDER — BUDESONIDE-FORMOTEROL FUMARATE 160-4.5 MCG/ACT IN AERO: 2.0000 | INHALATION_SPRAY | Freq: Two times a day (BID) | RESPIRATORY_TRACT | 0 refills | Status: DC

## 2016-10-17 NOTE — Telephone Encounter (Signed)
Pt c/o worsening hoarseness, occasional hand tremors, PND.  s/s present X1 week. States symptoms have started since pt started taking Trelegy.   Denies cough, mucus production, fever, chest tightness.  Has been drinking hot tea and taking maintenance meds to help with symptoms.   Pt requesting further recs.  Pt uses [facility] on [address].   Sending to PM as MR is 11pm elink tonight.  Please advise on recs.  Thanks!

## 2016-10-17 NOTE — Telephone Encounter (Signed)
Stop trelegy. Switch to incruse and symbicort 160. Needs to be seen in office if symptoms persist.

## 2016-10-17 NOTE — Telephone Encounter (Signed)
Spoke with pt, aware of recs.  Samples of rx left up front for pickup.  Nothing further needed at this time.

## 2016-10-25 ENCOUNTER — Telehealth: Payer: Self-pay | Admitting: Internal Medicine

## 2016-10-25 NOTE — Telephone Encounter (Signed)
Spoke with the pt and have shown her how to use symbicort and Incruse  She verbalized understanding

## 2016-10-31 ENCOUNTER — Telehealth: Payer: Self-pay | Admitting: Internal Medicine

## 2016-10-31 NOTE — Telephone Encounter (Signed)
Called and spoke with pt and she is aware of CY recs.  She will call back in the next couple of days to give an update.

## 2016-10-31 NOTE — Telephone Encounter (Signed)
While we try to see which of the medicines may be causing problems, suggest:                        Reduce Symbicort to 1 puff, twice daily.  Use this before breakfast and before supper. That should take care of the hoarseness by clearing it off her throat.                          Hold off on Incruse for now

## 2016-10-31 NOTE — Telephone Encounter (Signed)
Pt states that she is having some side effects from Incruse and Symbicort.  Symptoms are Trembling and blurred vision - started about 3 days after starting the inhalers. Pt states that she started with trembling first and then about 1 day later the blurred vision set in. Pt states that over this past weekend she started with hoarseness. Pt states that the inhalers have helped her breathing a lot and she is breathing so much better with these but cannot handle the side effects. Pt does not want this to interfere with her driving.  Please advise Dr Annamaria Boots as Dr Chase Caller is not available today. Thanks.

## 2016-11-10 ENCOUNTER — Telehealth: Payer: Self-pay | Admitting: Internal Medicine

## 2016-11-10 NOTE — Telephone Encounter (Signed)
Called spoke with patient who reported she is doing well on the Symbicort 160 1 puff BID as recommended by CY in the 4.9.18 phone note.  She has noted an improvement in her shortness of breath since beginning inhaler therapy, and the hoarseness and other side effects have resolved since stopping the Trelegy and Incruse.  MR please advise if pt should continue the Symbicort at this dosing - she has approx 20 inhalations left on her current sample.  Pt is okay with Rx being sent to [facility].  Thank you.  **Trelegy and Incruse removed from pt's med list**

## 2016-11-11 MED ORDER — BUDESONIDE-FORMOTEROL FUMARATE 160-4.5 MCG/ACT IN AERO: 2.0000 | INHALATION_SPRAY | Freq: Two times a day (BID) | RESPIRATORY_TRACT | 11 refills | Status: DC

## 2016-11-11 NOTE — Telephone Encounter (Signed)
Called and spoke with pt and she is aware of rx that has been send to the pharmacy.

## 2016-11-11 NOTE — Telephone Encounter (Signed)
Send symbicort 160   Dr. Brand Males, M.D., [facility].C.P Pulmonary and Critical Care Medicine Staff Physician [place] [facility] Pager: [phone], If no answer or between  15:00h - 7:00h: call [phone]  11/11/2016 8:40 AM

## 2016-11-15 ENCOUNTER — Encounter (HOSPITAL_COMMUNITY): Payer: Self-pay

## 2016-11-15 ENCOUNTER — Ambulatory Visit (HOSPITAL_COMMUNITY): Admit: 2016-11-15 | Payer: Medicare Other | Admitting: Gastroenterology

## 2016-11-15 SURGERY — COLONOSCOPY WITH PROPOFOL
Anesthesia: Monitor Anesthesia Care

## 2016-11-21 ENCOUNTER — Telehealth: Payer: Self-pay | Admitting: Internal Medicine

## 2016-11-21 MED ORDER — ALBUTEROL SULFATE 108 (90 BASE) MCG/ACT IN AEPB: 2.0000 | INHALATION_SPRAY | Freq: Three times a day (TID) | RESPIRATORY_TRACT | 11 refills | Status: DC | PRN

## 2016-11-21 MED FILL — EXEMESTANE 25 MG TABLET: 25 | 90 days supply | Qty: 90 | Fill #1

## 2016-11-21 NOTE — Telephone Encounter (Signed)
Called and spoke with pt and she is aware of refill of the proair that has been sent to her pharmacy. Nothing further is needed.

## 2016-11-22 ENCOUNTER — Telehealth: Payer: Self-pay | Admitting: Internal Medicine

## 2016-11-22 NOTE — Telephone Encounter (Signed)
atc pt, no answer, no vm, fax number after several rings.  Called pharmacy, the only cheaper albuterol inhaler is ventolin which is $58/inhaler.  Proair HFA, Proventil are more expensive than the proair respiclick rx pt has on file.  Next step would be to apply for financial assistance.

## 2016-11-23 NOTE — Telephone Encounter (Signed)
I have called and spoke with pt and she is aware of pt assistance forms that will be mailed to her.  I have highlighted what is needed from the pt and she is aware to return all of these forms with the correct documentation to our office.  Will forward to elise to follow up on this.

## 2016-11-28 NOTE — Telephone Encounter (Signed)
Will sign off on message and a new encounter will be opened once pt returns forms.

## 2016-11-29 ENCOUNTER — Telehealth: Payer: Self-pay | Admitting: Internal Medicine

## 2016-11-29 NOTE — Telephone Encounter (Signed)
lmtcb X1 for pt  

## 2016-11-30 ENCOUNTER — Telehealth: Payer: Self-pay | Admitting: Internal Medicine

## 2016-11-30 NOTE — Telephone Encounter (Signed)
Patient returning call - she can be reached at [phone] -pr

## 2016-11-30 NOTE — Telephone Encounter (Signed)
Spoke with pt, who states proair needs to be written for a 30 day supply. Pt states insurance sent a letter that stats a proair was prescribed for a 33 day supply. Per our records on 5/74/93 proair respiclick was prescribed for 1 inhaler with 11 refills. I have spoken with the pharmacist at [facility], who was able to change Rx to 30 day supply. Rx was approved with a 45 dollar co-pay. Pt is aware and voiced her understanding. Nothing further needed.

## 2016-11-30 NOTE — Telephone Encounter (Signed)
Spoke with Balinda Quails at [facility], rx sig edited to allow for insurance coverage.  Nothing further needed.

## 2016-12-05 ENCOUNTER — Telehealth: Payer: Self-pay | Admitting: Internal Medicine

## 2016-12-05 MED ORDER — CEPHALEXIN 500 MG PO CAPS: 500.0000 mg | ORAL_CAPSULE | Freq: Three times a day (TID) | ORAL | 0 refills | Status: DC

## 2016-12-05 MED ORDER — PREDNISONE 10 MG PO TABS: ORAL_TABLET | ORAL | 0 refills | Status: DC

## 2016-12-05 NOTE — Telephone Encounter (Signed)
Rx has been sent to preferred pharmacy.  Pt is aware and voiced her understanding. Nothing further needed.  

## 2016-12-05 NOTE — Telephone Encounter (Signed)
Pt c/o increased nonprod cough, wheezing, sore throat, PND.  Pt feels like she has chest congestion but is unable to cough it up.  S/s present X3-4 days.   Denies fever, chest pain.   Pt taking maintenance inhalers to help with symptoms- requesting further recs.  Uses [facility] on [address].    MR please advise.  Thanks!

## 2016-12-05 NOTE — Telephone Encounter (Signed)
Allergies  Allergen Reactions  . Crab [Shellfish Allergy] Anaphylaxis  . Iodine Anaphylaxis  . Iohexol      Code: HIVES, Desc: pt states , swelling mouth , throat, and has difficulty breathing   . Lactose Intolerance (Gi)   . Adhesive [Tape] Rash  . Latex Itching and Rash   Please take prednisone 40 mg x1 day, then 30 mg x1 day, then 20 mg x1 day, then 10 mg x1 day, and then 5 mg x1 day and stop  And cephalexin '500mg'$  three times daily x  5 days   Dr. Brand Males, M.D., [facility].C.P Pulmonary and Critical Care Medicine Staff Physician [place] [facility] Pager: [phone], If no answer or between  15:00h - 7:00h: call [phone]  12/05/2016 2:12 PM

## 2016-12-06 ENCOUNTER — Telehealth: Payer: Self-pay | Admitting: Internal Medicine

## 2016-12-06 MED ORDER — PREDNISONE 10 MG PO TABS: ORAL_TABLET | ORAL | 0 refills | Status: DC

## 2016-12-06 NOTE — Telephone Encounter (Signed)
Pt aware that Rx for prednisone & Keflex have been sent to [facility] on [address].  Nothing further needed

## 2016-12-08 ENCOUNTER — Emergency Department (HOSPITAL_COMMUNITY): Payer: Medicare Other

## 2016-12-08 ENCOUNTER — Encounter (HOSPITAL_COMMUNITY): Payer: Self-pay

## 2016-12-08 ENCOUNTER — Inpatient Hospital Stay (HOSPITAL_COMMUNITY)
Admission: EM | Admit: 2016-12-08 | Discharge: 2016-12-12 | DRG: 190 | Disposition: A | Discharge status: Home / Self Care | Payer: Medicare Other | Attending: Internal Medicine | Admitting: Internal Medicine

## 2016-12-08 ENCOUNTER — Observation Stay (HOSPITAL_BASED_OUTPATIENT_CLINIC_OR_DEPARTMENT_OTHER): Payer: Medicare Other

## 2016-12-08 DIAGNOSIS — Z85038 Personal history of other malignant neoplasm of large intestine: Secondary | ICD-10-CM | POA: Diagnosis present

## 2016-12-08 DIAGNOSIS — Z85528 Personal history of other malignant neoplasm of kidney: Secondary | ICD-10-CM

## 2016-12-08 DIAGNOSIS — R609 Edema, unspecified: Secondary | ICD-10-CM

## 2016-12-08 DIAGNOSIS — K219 Gastro-esophageal reflux disease without esophagitis: Secondary | ICD-10-CM | POA: Diagnosis present

## 2016-12-08 DIAGNOSIS — J849 Interstitial pulmonary disease, unspecified: Secondary | ICD-10-CM | POA: Diagnosis present

## 2016-12-08 DIAGNOSIS — I272 Pulmonary hypertension, unspecified: Secondary | ICD-10-CM | POA: Diagnosis present

## 2016-12-08 DIAGNOSIS — E1165 Type 2 diabetes mellitus with hyperglycemia: Secondary | ICD-10-CM | POA: Diagnosis present

## 2016-12-08 DIAGNOSIS — Z905 Acquired absence of kidney: Secondary | ICD-10-CM

## 2016-12-08 DIAGNOSIS — E118 Type 2 diabetes mellitus with unspecified complications: Secondary | ICD-10-CM

## 2016-12-08 DIAGNOSIS — Z853 Personal history of malignant neoplasm of breast: Secondary | ICD-10-CM

## 2016-12-08 DIAGNOSIS — J441 Chronic obstructive pulmonary disease with (acute) exacerbation: Secondary | ICD-10-CM | POA: Diagnosis not present

## 2016-12-08 DIAGNOSIS — Z85118 Personal history of other malignant neoplasm of bronchus and lung: Secondary | ICD-10-CM

## 2016-12-08 DIAGNOSIS — Z800 Family history of malignant neoplasm of digestive organs: Secondary | ICD-10-CM

## 2016-12-08 DIAGNOSIS — Z9981 Dependence on supplemental oxygen: Secondary | ICD-10-CM

## 2016-12-08 DIAGNOSIS — Z9221 Personal history of antineoplastic chemotherapy: Secondary | ICD-10-CM

## 2016-12-08 DIAGNOSIS — Z8249 Family history of ischemic heart disease and other diseases of the circulatory system: Secondary | ICD-10-CM

## 2016-12-08 DIAGNOSIS — E119 Type 2 diabetes mellitus without complications: Secondary | ICD-10-CM

## 2016-12-08 DIAGNOSIS — I10 Essential (primary) hypertension: Secondary | ICD-10-CM | POA: Diagnosis present

## 2016-12-08 DIAGNOSIS — C50411 Malignant neoplasm of upper-outer quadrant of right female breast: Secondary | ICD-10-CM | POA: Diagnosis present

## 2016-12-08 DIAGNOSIS — R0602 Shortness of breath: Secondary | ICD-10-CM

## 2016-12-08 DIAGNOSIS — C649 Malignant neoplasm of unspecified kidney, except renal pelvis: Secondary | ICD-10-CM | POA: Diagnosis present

## 2016-12-08 DIAGNOSIS — F419 Anxiety disorder, unspecified: Secondary | ICD-10-CM | POA: Diagnosis present

## 2016-12-08 DIAGNOSIS — D696 Thrombocytopenia, unspecified: Secondary | ICD-10-CM | POA: Diagnosis present

## 2016-12-08 DIAGNOSIS — T380X5A Adverse effect of glucocorticoids and synthetic analogues, initial encounter: Secondary | ICD-10-CM | POA: Diagnosis not present

## 2016-12-08 DIAGNOSIS — J8489 Other specified interstitial pulmonary diseases: Secondary | ICD-10-CM | POA: Diagnosis present

## 2016-12-08 DIAGNOSIS — Z8371 Family history of colonic polyps: Secondary | ICD-10-CM

## 2016-12-08 DIAGNOSIS — R0902 Hypoxemia: Secondary | ICD-10-CM

## 2016-12-08 DIAGNOSIS — C3491 Malignant neoplasm of unspecified part of right bronchus or lung: Secondary | ICD-10-CM | POA: Diagnosis present

## 2016-12-08 DIAGNOSIS — Z90710 Acquired absence of both cervix and uterus: Secondary | ICD-10-CM

## 2016-12-08 DIAGNOSIS — F17210 Nicotine dependence, cigarettes, uncomplicated: Secondary | ICD-10-CM | POA: Diagnosis present

## 2016-12-08 DIAGNOSIS — Z803 Family history of malignant neoplasm of breast: Secondary | ICD-10-CM

## 2016-12-08 DIAGNOSIS — D573 Sickle-cell trait: Secondary | ICD-10-CM | POA: Diagnosis present

## 2016-12-08 DIAGNOSIS — J9621 Acute and chronic respiratory failure with hypoxia: Secondary | ICD-10-CM | POA: Diagnosis present

## 2016-12-08 LAB — BASIC METABOLIC PANEL
ANION GAP: 14 (ref 5–15)
BUN: 21 mg/dL — ABNORMAL HIGH (ref 6–20)
CALCIUM: 9.3 mg/dL (ref 8.9–10.3)
CO2: 28 mmol/L (ref 22–32)
Chloride: 94 mmol/L — ABNORMAL LOW (ref 101–111)
Creatinine, Ser: 0.97 mg/dL (ref 0.44–1.00)
GFR, EST NON AFRICAN AMERICAN: 58 mL/min — AB (ref >60)
Glucose, Bld: 229 mg/dL — ABNORMAL HIGH (ref 65–99)
Potassium: 3.2 mmol/L — ABNORMAL LOW (ref 3.5–5.1)
Sodium: 136 mmol/L (ref 135–145)

## 2016-12-08 LAB — I-STAT TROPONIN, ED: Troponin i, poc: 0 ng/mL (ref 0.00–0.08)

## 2016-12-08 LAB — CBC WITH DIFFERENTIAL/PLATELET
BASOS ABS: 0 10*3/uL (ref 0.0–0.1)
BASOS PCT: 0 %
Eosinophils Absolute: 0 10*3/uL (ref 0.0–0.7)
Eosinophils Relative: 0 %
HCT: 39.2 % (ref 36.0–46.0)
HEMOGLOBIN: 13 g/dL (ref 12.0–15.0)
Lymphocytes Relative: 7 %
Lymphs Abs: 0.5 10*3/uL — ABNORMAL LOW (ref 0.7–4.0)
MCH: 29 pg (ref 26.0–34.0)
MCHC: 33.2 g/dL (ref 30.0–36.0)
MCV: 87.3 fL (ref 78.0–100.0)
Monocytes Absolute: 0.2 10*3/uL (ref 0.1–1.0)
Monocytes Relative: 3 %
NEUTROS ABS: 6.8 10*3/uL (ref 1.7–7.7)
NEUTROS PCT: 90 %
Platelets: 120 10*3/uL — ABNORMAL LOW (ref 150–400)
RBC: 4.49 MIL/uL (ref 3.87–5.11)
RDW: 15.1 % (ref 11.5–15.5)
WBC: 7.6 10*3/uL (ref 4.0–10.5)

## 2016-12-08 LAB — GLUCOSE, CAPILLARY
GLUCOSE-CAPILLARY: 270 mg/dL — AB (ref 65–99)
GLUCOSE-CAPILLARY: 327 mg/dL — AB (ref 65–99)
GLUCOSE-CAPILLARY: 221 mg/dL — AB (ref 65–99)

## 2016-12-08 MED ORDER — LEVOFLOXACIN IN D5W 500 MG/100ML IV SOLN
500.0000 mg | INTRAVENOUS | Status: DC
  Administered 2016-12-08: 500 mg via INTRAVENOUS
  Filled 2016-12-08 (×2): qty 100

## 2016-12-08 MED ORDER — ACETAMINOPHEN 500 MG PO TABS: 1000.0000 mg | ORAL_TABLET | Freq: Four times a day (QID) | ORAL | Status: DC | PRN

## 2016-12-08 MED ORDER — BENZONATATE 100 MG PO CAPS
200.0000 mg | ORAL_CAPSULE | Freq: Three times a day (TID) | ORAL | Status: DC | PRN
Start: 2016-12-08 — End: 2016-12-09

## 2016-12-08 MED ORDER — IPRATROPIUM-ALBUTEROL 0.5-2.5 (3) MG/3ML IN SOLN
3.0000 mL | Freq: Four times a day (QID) | RESPIRATORY_TRACT | Status: DC
  Administered 2016-12-08 (×2): 3 mL via RESPIRATORY_TRACT
  Filled 2016-12-08 (×2): qty 3

## 2016-12-08 MED ORDER — SODIUM CHLORIDE 0.9 % IV SOLN
250.0000 mL | INTRAVENOUS | Status: DC | PRN
Start: 2016-12-08 — End: 2016-12-12
  Administered 2016-12-08: 250 mL via INTRAVENOUS

## 2016-12-08 MED ORDER — HYDROCHLOROTHIAZIDE 25 MG PO TABS
25.0000 mg | ORAL_TABLET | Freq: Every day | ORAL | Status: DC
  Administered 2016-12-09 – 2016-12-12 (×4): 25 mg via ORAL
  Filled 2016-12-08 (×4): qty 1

## 2016-12-08 MED ORDER — MENTHOL 3 MG MT LOZG
1.0000 | LOZENGE | OROMUCOSAL | Status: DC | PRN
  Filled 2016-12-08: qty 9

## 2016-12-08 MED ORDER — IPRATROPIUM-ALBUTEROL 0.5-2.5 (3) MG/3ML IN SOLN
3.0000 mL | Freq: Once | RESPIRATORY_TRACT | Status: AC
  Administered 2016-12-08: 3 mL via RESPIRATORY_TRACT
  Filled 2016-12-08: qty 3

## 2016-12-08 MED ORDER — ONDANSETRON HCL 4 MG PO TABS: 4.0000 mg | ORAL_TABLET | Freq: Four times a day (QID) | ORAL | Status: DC | PRN

## 2016-12-08 MED ORDER — ALPRAZOLAM 0.5 MG PO TABS
0.5000 mg | ORAL_TABLET | Freq: Two times a day (BID) | ORAL | Status: DC
  Administered 2016-12-08 – 2016-12-12 (×9): 0.5 mg via ORAL
  Filled 2016-12-08 (×9): qty 1

## 2016-12-08 MED ORDER — IPRATROPIUM-ALBUTEROL 0.5-2.5 (3) MG/3ML IN SOLN
3.0000 mL | Freq: Four times a day (QID) | RESPIRATORY_TRACT | Status: DC
  Administered 2016-12-09 (×2): 3 mL via RESPIRATORY_TRACT
  Filled 2016-12-08 (×2): qty 3

## 2016-12-08 MED ORDER — LOSARTAN POTASSIUM 50 MG PO TABS
50.0000 mg | ORAL_TABLET | Freq: Every day | ORAL | Status: DC
  Administered 2016-12-08 – 2016-12-12 (×5): 50 mg via ORAL
  Filled 2016-12-08 (×5): qty 1

## 2016-12-08 MED ORDER — PHENOL 1.4 % MT LIQD
1.0000 | OROMUCOSAL | Status: DC | PRN
  Administered 2016-12-08: 1 via OROMUCOSAL
  Filled 2016-12-08: qty 177

## 2016-12-08 MED ORDER — SODIUM CHLORIDE 0.9% FLUSH
3.0000 mL | Freq: Two times a day (BID) | INTRAVENOUS | Status: DC
  Administered 2016-12-08 – 2016-12-11 (×5): 3 mL via INTRAVENOUS

## 2016-12-08 MED ORDER — IRON 240 (27 FE) MG PO TABS: 1.0000 | ORAL_TABLET | Freq: Every morning | ORAL | Status: DC

## 2016-12-08 MED ORDER — ACETAMINOPHEN 325 MG PO TABS
650.0000 mg | ORAL_TABLET | Freq: Four times a day (QID) | ORAL | Status: DC | PRN
  Administered 2016-12-10 – 2016-12-12 (×2): 650 mg via ORAL
  Filled 2016-12-08 (×2): qty 2

## 2016-12-08 MED ORDER — ONDANSETRON HCL 4 MG/2ML IJ SOLN: 4.0000 mg | Freq: Four times a day (QID) | INTRAMUSCULAR | Status: DC | PRN

## 2016-12-08 MED ORDER — FLUTICASONE PROPIONATE 50 MCG/ACT NA SUSP
2.0000 | Freq: Every day | NASAL | Status: DC
  Administered 2016-12-08 – 2016-12-12 (×5): 2 via NASAL
  Filled 2016-12-08: qty 16

## 2016-12-08 MED ORDER — SENNOSIDES-DOCUSATE SODIUM 8.6-50 MG PO TABS: 1.0000 | ORAL_TABLET | Freq: Every evening | ORAL | Status: DC | PRN

## 2016-12-08 MED ORDER — TRAMADOL HCL 50 MG PO TABS
50.0000 mg | ORAL_TABLET | Freq: Two times a day (BID) | ORAL | Status: DC
  Administered 2016-12-08 – 2016-12-12 (×9): 50 mg via ORAL
  Filled 2016-12-08 (×9): qty 1

## 2016-12-08 MED ORDER — OXYMETAZOLINE HCL 0.05 % NA SOLN
1.0000 | Freq: Two times a day (BID) | NASAL | Status: DC
  Administered 2016-12-08 – 2016-12-12 (×9): 1 via NASAL
  Filled 2016-12-08: qty 15

## 2016-12-08 MED ORDER — INSULIN ASPART 100 UNIT/ML ~~LOC~~ SOLN
0.0000 [IU] | Freq: Three times a day (TID) | SUBCUTANEOUS | Status: DC
  Administered 2016-12-08: 15 [IU] via SUBCUTANEOUS
  Administered 2016-12-09: 8 [IU] via SUBCUTANEOUS
  Administered 2016-12-09: 5 [IU] via SUBCUTANEOUS
  Administered 2016-12-09: 3 [IU] via SUBCUTANEOUS
  Administered 2016-12-10: 15 [IU] via SUBCUTANEOUS
  Administered 2016-12-10: 8 [IU] via SUBCUTANEOUS
  Administered 2016-12-10: 5 [IU] via SUBCUTANEOUS
  Administered 2016-12-11: 3 [IU] via SUBCUTANEOUS
  Administered 2016-12-11: 5 [IU] via SUBCUTANEOUS
  Administered 2016-12-11: 15 [IU] via SUBCUTANEOUS
  Administered 2016-12-12 (×2): 3 [IU] via SUBCUTANEOUS
  Administered 2016-12-12: 15 [IU] via SUBCUTANEOUS

## 2016-12-08 MED ORDER — SODIUM CHLORIDE 0.9% FLUSH: 3.0000 mL | INTRAVENOUS | Status: DC | PRN

## 2016-12-08 MED ORDER — IPRATROPIUM-ALBUTEROL 0.5-2.5 (3) MG/3ML IN SOLN: 3.0000 mL | Freq: Once | RESPIRATORY_TRACT | Status: DC

## 2016-12-08 MED ORDER — GUAIFENESIN ER 600 MG PO TB12
600.0000 mg | ORAL_TABLET | Freq: Two times a day (BID) | ORAL | Status: DC
  Administered 2016-12-08 (×2): 600 mg via ORAL
  Filled 2016-12-08 (×2): qty 1

## 2016-12-08 MED ORDER — ENOXAPARIN SODIUM 40 MG/0.4ML ~~LOC~~ SOLN
40.0000 mg | SUBCUTANEOUS | Status: DC
  Administered 2016-12-08: 40 mg via SUBCUTANEOUS
  Filled 2016-12-08: qty 0.4

## 2016-12-08 MED ORDER — VITAMIN C 500 MG PO TABS
500.0000 mg | ORAL_TABLET | Freq: Every morning | ORAL | Status: DC
  Administered 2016-12-08 – 2016-12-12 (×5): 500 mg via ORAL
  Filled 2016-12-08 (×5): qty 1

## 2016-12-08 MED ORDER — ATORVASTATIN CALCIUM 10 MG PO TABS
20.0000 mg | ORAL_TABLET | Freq: Every day | ORAL | Status: DC
  Administered 2016-12-09 – 2016-12-12 (×4): 20 mg via ORAL
  Filled 2016-12-08 (×4): qty 2

## 2016-12-08 MED ORDER — CALCIUM CARBONATE-VITAMIN D 500-200 MG-UNIT PO TABS
1.0000 | ORAL_TABLET | ORAL | Status: DC
  Administered 2016-12-09 – 2016-12-12 (×2): 1 via ORAL
  Filled 2016-12-08 (×3): qty 1

## 2016-12-08 MED ORDER — METOPROLOL TARTRATE 50 MG PO TABS
100.0000 mg | ORAL_TABLET | Freq: Every day | ORAL | Status: DC
  Administered 2016-12-09 – 2016-12-12 (×4): 100 mg via ORAL
  Filled 2016-12-08 (×4): qty 2

## 2016-12-08 MED ORDER — FERROUS GLUCONATE 324 (38 FE) MG PO TABS
324.0000 mg | ORAL_TABLET | Freq: Every day | ORAL | Status: DC
  Administered 2016-12-09 – 2016-12-12 (×4): 324 mg via ORAL
  Filled 2016-12-08 (×4): qty 1

## 2016-12-08 MED ORDER — EXEMESTANE 25 MG PO TABS
25.0000 mg | ORAL_TABLET | Freq: Every day | ORAL | Status: DC
  Administered 2016-12-09 – 2016-12-12 (×4): 25 mg via ORAL
  Filled 2016-12-08 (×4): qty 1

## 2016-12-08 MED ORDER — PANTOPRAZOLE SODIUM 40 MG PO TBEC
40.0000 mg | DELAYED_RELEASE_TABLET | Freq: Every day | ORAL | Status: DC
  Administered 2016-12-08 – 2016-12-12 (×5): 40 mg via ORAL
  Filled 2016-12-08 (×5): qty 1

## 2016-12-08 MED ORDER — PAROXETINE HCL 20 MG PO TABS
40.0000 mg | ORAL_TABLET | Freq: Every day | ORAL | Status: DC
  Administered 2016-12-09 – 2016-12-12 (×4): 40 mg via ORAL
  Filled 2016-12-08 (×4): qty 2

## 2016-12-08 MED ORDER — LORATADINE 10 MG PO TABS
10.0000 mg | ORAL_TABLET | Freq: Every day | ORAL | Status: DC
  Administered 2016-12-08 – 2016-12-12 (×5): 10 mg via ORAL
  Filled 2016-12-08 (×5): qty 1

## 2016-12-08 MED ORDER — ALBUTEROL SULFATE (2.5 MG/3ML) 0.083% IN NEBU
2.5000 mg | INHALATION_SOLUTION | RESPIRATORY_TRACT | Status: DC | PRN
  Administered 2016-12-09 (×2): 2.5 mg via RESPIRATORY_TRACT
  Filled 2016-12-08 (×2): qty 3

## 2016-12-08 MED ORDER — METHYLPREDNISOLONE SODIUM SUCC 125 MG IJ SOLR
60.0000 mg | Freq: Two times a day (BID) | INTRAMUSCULAR | Status: DC
  Administered 2016-12-08 – 2016-12-11 (×6): 60 mg via INTRAVENOUS
  Filled 2016-12-08 (×6): qty 2

## 2016-12-08 MED ORDER — ACETAMINOPHEN 650 MG RE SUPP
650.0000 mg | Freq: Four times a day (QID) | RECTAL | Status: DC | PRN
Start: 2016-12-08 — End: 2016-12-12

## 2016-12-08 NOTE — ED Notes (Signed)
Patient ambulated from room 24 to room 10 and became short of breath. Pulse ox was at 88% then went up to 93% and back down to 88% when we got back to the room.

## 2016-12-08 NOTE — Progress Notes (Signed)
Pharmacy Antibiotic Note  Amy [name] is a 71 y.o. female admitted on 12/08/2016 with AECOPD.  Pharmacy has been consulted for Levofloxacin dosing. Failed outpatient treatment with Keflex and prednisone.  Plan:  Levaquin 500 mg IV q24 hr  F/u weight in chart to calculate CrCl  F/u SCr, respiratory status     Temp (24hrs), Avg:99.9 F (37.7 C), Min:99.9 F (37.7 C), Max:99.9 F (37.7 C)   Recent Labs Lab 12/08/16 0805  WBC 7.6  CREATININE 0.97    CrCl cannot be calculated (Unknown ideal weight.).    Allergies  Allergen Reactions  . Crab [Shellfish Allergy] Anaphylaxis  . Iodine Anaphylaxis  . Iohexol      Code: HIVES, Desc: pt states , swelling mouth , throat, and has difficulty breathing   . Lactose Intolerance (Gi)   . Adhesive [Tape] Rash  . Latex Itching and Rash    Thank you for allowing pharmacy to be a part of this patient's care.  Reuel Boom, PharmD, BCPS Pager: [phone] 12/08/2016, 12:42 PM

## 2016-12-08 NOTE — ED Triage Notes (Signed)
Pt brought in by EMS from home Pt is c/o of worsen SOB and  Has been using albuterol more frequent, and has not been effective Pt has hx of COPD and uses oxygen at home @ 2 LPM via [place].  Pt was given '125mg'$  of solumedrol, 2 Duoneb txmt enroute to [facility], and 20G  Left AC    V/S 132/43, HR 114, 96%

## 2016-12-08 NOTE — Progress Notes (Signed)
**  Preliminary report by tech**  Bilateral lower extremity venous duplex completed. There is no evidence of deep or superficial vein thrombosis involving the right and left lower extremities. All visualized vessels appear patent and compressible. There is no evidence of Baker's cysts bilaterally.  12/08/16 1:45 PM Amy [name] RVT

## 2016-12-08 NOTE — ED Notes (Signed)
Report called to unit. 

## 2016-12-08 NOTE — H&P (Signed)
HISTORY AND PHYSICAL       PATIENT DETAILS Name: Amy [name] Age: 71 y.o. Sex: female Date of Birth: 18-Feb-1946 Admit Date: 12/08/2016 CBJ:SEGBTD, Jori Moll, MD   Patient coming from: Home   CHIEF COMPLAINT:  Shortness of breath since this past Saturday  HPI: Amy [name] is a 71 y.o. female with medical history significant of COPD on home O2, interstitial lung disease, mild pulmonary hypertension, hypertension, morbid obesity, history of multiple malignancies (lung, breast, colon, kidney) presented to the hospital for gradually worsening shortness of breath. Per patient, over the past weekend, she developed sore throat followed by a mostly dry cough. This was associated with some stuffiness of her nose. She claims to have some associated subjective fever as well. With these symptoms, patient steadily started having gradual worsening of her shortness of breath. She called her pulmonologist's office, and was prescribed a steroid taper and antibiotics-even with these measures, her shortness of breath continued to worsen, this morning she was hardly able to even ambulate to the bathroom which was 10 feet away. As a result she was brought to the emergency room for further evaluation and treatment. In the emergency room, patient was provided with steroids, numerous bronchodilators-with these measures patient improved, she was able to ambulate half of the ED unit-but then still did not feel that she was back to her baseline and became short of breath. [facility] service was asked to admit this patient for further evaluation and treatment.  Patient denies any headache, nausea, vomiting, abdominal pain, chest pain  ED Course:  See above  Note: Lives at: Home Mobility: Independent Chronic Indwelling Foley:no   REVIEW OF SYSTEMS:  Constitutional:   No  weight loss, night sweats, fatigue.  HEENT:    No headaches, Dysphagia,Tooth/dental problems,No sneezing, itching, ear  ache  Cardio-vascular: No chest pain,Orthopnea, PND,lower extremity edema, anasarca, palpitations  GI:  No heartburn, indigestion, abdominal pain, nausea, vomiting, diarrhea, melena or hematochezia  Resp: No  hemoptysis,plueritic chest pain.   Skin:  No rash or lesions.  GU:  No dysuria, change in color of urine, no urgency or frequency.  No flank pain.  Musculoskeletal: No joint pain or swelling.  No decreased range of motion.  No back pain.  Endocrine: No heat intolerance, no cold intolerance, no polyuria, no polydipsia  Psych: No change in mood or affect. No depression or anxiety.  No memory loss.   ALLERGIES:   Allergies  Allergen Reactions  . Crab [Shellfish Allergy] Anaphylaxis  . Iodine Anaphylaxis  . Iohexol      Code: HIVES, Desc: pt states , swelling mouth , throat, and has difficulty breathing   . Lactose Intolerance (Gi)   . Adhesive [Tape] Rash  . Latex Itching and Rash    PAST MEDICAL HISTORY: Past Medical History:  Diagnosis Date  . Allergy   . Anemia   . Anxiety   . Arthritis   . Blood dyscrasia    Sickle Cell traits  . Blood transfusion without reported diagnosis 1999   prbc S/P COLON SURGERY  . Breast cancer, right breast ([place]) 10/03/12  . Cancer ([place])    colon,renal,lung  . Cataract    NO SURGERY  . Chronic kidney disease 1999   RIGHT KIDNEY REMOVED  . Colon cancer ([place]) 1999, 2013  . Depression   . Diabetes mellitus    diet controlled  . GERD (gastroesophageal reflux disease)   . H/O: lung cancer   .  Heart murmur   . Hernia    Near umbilicus  . History of colon cancer   . History of kidney cancer   . Hot flashes    5 yr pill for breast CA causing hot flashes  . HX: breast cancer 2014   --right breast  . Hypertension    Does not see a cardiologist  . Internal hemorrhoids   . Interstitial lung disease ([place]) 11/25/2015  . Lung cancer ([place]) 2001  . Neuromuscular disorder ([place])    PERIPHERAL NEUROPATHY FEET  . Shortness of  breath    with exertion   . Sickle cell trait ([place])   . Tubular adenoma of colon   . Ulcer    HX YEARS AGO    PAST SURGICAL HISTORY: Past Surgical History:  Procedure Laterality Date  . ABDOMINAL HYSTERECTOMY  80's   fibroids  . BREAST BIOPSY     Bilateral cysts/benign  . BREAST BIOPSY Right 10/03/12   ER/PR +  . BREAST BIOPSY Right 12/20/2012   Procedure: Right breast Excisional Biopsy;  Surgeon: Rolm Bookbinder, MD;  Location: [facility];  Service: General;  Laterality: Right;  . BREAST LUMPECTOMY WITH NEEDLE LOCALIZATION Right 12/20/2012   Procedure: BREAST LUMPECTOMY WITH NEEDLE LOCALIZATION;  Surgeon: Rolm Bookbinder, MD;  Location: [facility];  Service: General;  Laterality: Right;  . BREAST LUMPECTOMY WITH RADIOACTIVE SEED LOCALIZATION Right 01/19/2015   Procedure: RIGHT BREAST LUMPECTOMY WITH RADIOACTIVE SEED LOCALIZATION;  Surgeon: Rolm Bookbinder, MD;  Location: [facility];  Service: General;  Laterality: Right;  . CARDIAC CATHETERIZATION N/A 03/02/2016   Procedure: Right Heart Cath;  Surgeon: Jolaine Artist, MD;  Location: [facility];  Service: Cardiovascular;  Laterality: N/A;  . COLECTOMY  05-31-2012   laparoscopic ,possible open  . COLON SURGERY     chemo  . COLONOSCOPY    . [place]   right/chemo kidney removed for cancer  . LUNG LOBECTOMY     right lower  . PARTIAL COLECTOMY  05/31/2012   Procedure: PARTIAL COLECTOMY;  Surgeon: Rolm Bookbinder, MD;  Location[facility];  Service: General;  Laterality: N/A;  . PARTIAL HYSTERECTOMY    . POLYPECTOMY    . RE-EXCISION OF BREAST CANCER,SUPERIOR MARGINS Right 01/21/2013   Procedure: RE-EXCISION OF BREAST CANCER,SUPERIOR MARGINS;  Surgeon: Rolm Bookbinder, MD;  Location: [facility];  Service: General;  Laterality: Right;  . TONSILLECTOMY     as child  . TUBAL LIGATION      MEDICATIONS AT HOME: Prior to Admission medications   Medication Sig Start Date End Date Taking? Authorizing Provider   acetaminophen (TYLENOL) 500 MG tablet Take 1,000 mg by mouth every 6 (six) hours as needed (pain).   Yes [provider]  Albuterol Sulfate (PROAIR RESPICLICK) 440 (90 Base) MCG/ACT AEPB Inhale 2 puffs into the lungs every 8 (eight) hours as needed. 11/21/16  Yes Brand Males, MD  ALPRAZolam Duanne Moron) 1 MG tablet Take 0.5 mg by mouth 2 (two) times daily.    Yes [provider]  Ascorbic Acid (VITAMIN C WITH ROSE HIPS) 500 MG tablet Take 500 mg by mouth every morning.    Yes [provider]  atorvastatin (LIPITOR) 20 MG tablet Take 20 mg by mouth daily before breakfast.  03/08/12  Yes [provider]  budesonide-formoterol (SYMBICORT) 160-4.5 MCG/ACT inhaler Inhale 2 puffs into the lungs 2 (two) times daily. 11/11/16  Yes Brand Males, MD  Calcium Carbonate-Vitamin D (CALCIUM-VITAMIN D) 500-200 MG-UNIT per tablet  Take 1 tablet by mouth 3 (three) times a week. Take 1 tablet by mouth Monday, Wednesday, and Friday   Yes [provider]  cephALEXin (KEFLEX) 500 MG capsule Take 1 capsule (500 mg total) by mouth 3 (three) times daily. 12/05/16  Yes Brand Males, MD  exemestane (AROMASIN) 25 MG tablet TAKE 1 TABLET BY MOUTH AFTER BREAKFAST 07/13/16  Yes Nicholas Lose, MD  Ferrous Gluconate (IRON) 240 (27 FE) MG TABS Take 1 tablet by mouth every morning.    Yes [provider]  hydrochlorothiazide 25 MG tablet Take 25 mg by mouth daily before breakfast.  01/03/11  Yes [provider]  losartan (COZAAR) 50 MG tablet Take 50 mg by mouth daily.   Yes [provider]  metFORMIN (GLUCOPHAGE) 500 MG tablet Take 500 mg by mouth daily with breakfast.  12/26/12  Yes [provider]  metoprolol (LOPRESSOR) 100 MG tablet Take 100 mg by mouth daily before breakfast.  01/03/11  Yes [provider]  omeprazole (PRILOSEC) 20 MG capsule Take 20 mg by mouth every morning.    Yes [provider]  OXYGEN Inhale into the  lungs. Use 2L continuously   Yes [provider]  PARoxetine (PAXIL) 40 MG tablet Take 40 mg by mouth daily before breakfast.  12/22/10  Yes [provider]  predniSONE (DELTASONE) 10 MG tablet 4 tabs x 1 day, 3 tabs x 1 day,  2 tab x 1 day, 1 tab x 1 day, 0.5 tab x 1 day then stop. 12/06/16  Yes Brand Males, MD  traMADol (ULTRAM) 50 MG tablet Take 50 mg by mouth 2 (two) times daily.  12/27/10  Yes [provider]  Na Sulfate-K Sulfate-Mg Sulf 17.5-3.13-1.6 GM/180ML SOLN Suprep-Use as directed Patient not taking: Reported on 12/08/2016 09/22/16   [place], Amy S, PA-C    FAMILY HISTORY: Family History  Problem Relation Age of Onset  . Colon polyps Brother   . Multiple myeloma Brother 48  . Breast cancer Mother        dx in her 79s  . Heart disease Father   . Colon cancer Paternal Aunt   . Breast cancer Maternal Grandmother   . Colon cancer Cousin        2 paternal first cousins  . Leukemia Cousin 11  . Esophageal cancer Neg Hx   . Rectal cancer Neg Hx   . Stomach cancer Neg Hx      SOCIAL HISTORY:  reports that she quit smoking about 3 years ago. Her smoking use included Cigarettes. She started smoking about 24 years ago. She has a 26.25 pack-year smoking history. She has never used smokeless tobacco. She reports that she does not drink alcohol or use drugs.  PHYSICAL EXAM: Blood pressure (!) 141/98, pulse (!) 116, temperature 99.9 F (37.7 C), temperature source Oral, resp. rate (!) 34, last menstrual period 07/26/1983, SpO2 93 %.  General appearance :Awake, alert, not in any distress. Speech Clear. Not toxic Looking Eyes:, pupils equally reactive to light and accomodation,no scleral icterus.Pink conjunctiva HEENT: Atraumatic and Normocephalic Neck: supple, no JVD. No cervical lymphadenopathy. No thyromegaly Resp: Decreased air entry at bilateral bases-but moving air well otherwise-scattered rhonchi CVS: S1 S2 regular, no murmurs.  GI: Bowel sounds  present, Non tender and not distended with no gaurding, rigidity or rebound.No organomegaly Extremities: B/L Lower Ext shows no edema, both legs are warm to touch Neurology:  speech clear,Non focal, sensation is grossly intact. Psychiatric: Normal judgment and insight. Alert  and oriented x 3. Normal mood. Musculoskeletal:gait appears to be normal.No digital cyanosis Skin:No Rash, warm and dry Wounds:N/A  LABS ON ADMISSION:  I have personally reviewed following labs and imaging studies  CBC:  Recent Labs Lab 12/08/16 0805  WBC 7.6  NEUTROABS 6.8  HGB 13.0  HCT 39.2  MCV 87.3  PLT 120*    Basic Metabolic Panel:  Recent Labs Lab 12/08/16 0805  NA 136  K 3.2*  CL 94*  CO2 28  GLUCOSE 229*  BUN 21*  CREATININE 0.97  CALCIUM 9.3    GFR: CrCl cannot be calculated (Unknown ideal weight.).  Liver Function Tests: No results for input(s): AST, ALT, ALKPHOS, BILITOT, PROT, ALBUMIN in the last 168 hours. No results for input(s): LIPASE, AMYLASE in the last 168 hours. No results for input(s): AMMONIA in the last 168 hours.  Coagulation Profile: No results for input(s): INR, PROTIME in the last 168 hours.  Cardiac Enzymes: No results for input(s): CKTOTAL, CKMB, CKMBINDEX, TROPONINI in the last 168 hours.  BNP (last 3 results) No results for input(s): PROBNP in the last 8760 hours.  HbA1C: No results for input(s): HGBA1C in the last 72 hours.  CBG: No results for input(s): GLUCAP in the last 168 hours.  Lipid Profile: No results for input(s): CHOL, HDL, LDLCALC, TRIG, CHOLHDL, LDLDIRECT in the last 72 hours.  Thyroid Function Tests: No results for input(s): TSH, T4TOTAL, FREET4, T3FREE, THYROIDAB in the last 72 hours.  Anemia Panel: No results for input(s): VITAMINB12, FOLATE, FERRITIN, TIBC, IRON, RETICCTPCT in the last 72 hours.  Urine analysis:    Component Value Date/Time   COLORURINE YELLOW 04/24/2007 1050   APPEARANCEUR HAZY (A) 04/24/2007 1050    LABSPEC 1.016 04/24/2007 1050   PHURINE 6.5 04/24/2007 1050   GLUCOSEU NEGATIVE 04/24/2007 1050   HGBUR NEGATIVE 04/24/2007 [place] 04/24/2007 1050   KETONESUR NEGATIVE 04/24/2007 1050   PROTEINUR NEGATIVE 04/24/2007 1050   UROBILINOGEN 1.0 04/24/2007 1050   NITRITE NEGATIVE 04/24/2007 1050   LEUKOCYTESUR SMALL (A) 04/24/2007 1050    Sepsis Labs: Lactic Acid, Venous No results found for: [place]   Microbiology: No results found for this or any previous visit (from the past 240 hour(s)).    RADIOLOGIC STUDIES ON ADMISSION: Dg Chest 2 View  Result Date: 12/08/2016 CLINICAL DATA:  Shortness of breath. EXAM: CHEST  2 VIEW COMPARISON:  CT 11/19/2015.  Chest x-ray 12/12/2012. FINDINGS: Mediastinum hilar structures normal. Cardiomegaly with pulmonary arterial prominence suggesting pulmonary hypertension again noted. Postsurgical changes right lung. Mild chronic interstitial prominence again noted. A component of active interstitial process cannot be excluded. No pleural effusion or pneumothorax. Surgical clips posterior abdomen. IMPRESSION: 1. Postsurgical changes right lung. Mild pulmonary interstitial prominence again noted consistent with chronic interstitial disease. A component of active interstitial pneumonitis or very mild interstitial edema cannot be excluded. 2. Cardiomegaly with prominent central pulmonary arteries again noted suggesting pulmonary hypertension. Electronically Signed   By: Marcello Moores  Register   On: 12/08/2016 07:25    I have personally reviewed images of chest xray   EKG:  Personally reviewed. Sinus tachycardia  ASSESSMENT AND PLAN: Acute on chronic hypoxemic respiratory failure: Suspect COPD exacerbation due to probable bronchitis/URI symptoms. Continue steroids, bronchodilators-and empiric levofloxacin. She also complains of nasal/sinus congestion-start Flonase/Afrin/Claritin. She is already improving than her initial presentation (unable to  walk to her bathroom at home-but now able to ambulate half of the ED), suspect she will likely need overnight monitoring and observation, and  if she continues to improve, she could potential he be discharged home tomorrow morning.  Interstitial lung disease: Followed by Dr. Chase Caller in the outpatient setting-we'll be on the above noted treatment.  Hypertension: Currently controlled, continue usual antihypertensives  Type 2 diabetes: Hold metformin, start SSI and follow CBGs.  History of breast cancer: Followed at the cancer center-continue antiestrogen therapy  History of colon/breast/lung cancer: Continue outpatient follow-up with oncology and GI.  Further plan will depend as patient's clinical course evolves and further radiologic and laboratory data become available. Patient will be monitored closely.  Above noted plan was discussed with patient/ face to face at bedside, she was in agreement.   CONSULTS: None  DVT Prophylaxis: Prophylactic Lovenox   Code Status: Full Code  Disposition Plan:  Discharge back home  possibly in 1-2 days, depending on clinical course  Admission status: Observation going to  medical floor   Total time spent  45 minutes.Greater than 50% of this time was spent in counseling, explanation of diagnosis, planning of further management, and coordination of care.  Oren Binet [facility] Pager [phone]  If 7PM-7AM, please contact night-coverage [email or web address] Password [facility] 12/08/2016, 10:28 AM

## 2016-12-08 NOTE — ED Provider Notes (Signed)
Emergency Department Provider Note   I have reviewed the triage vital signs and the nursing notes.   HISTORY  Chief Complaint Shortness of Breath   HPI Amy [name] is a 71 y.o. female with PMH of GERD, CKD, and interstitial lung on 2L [place] at home presents to the emergency department for evaluation of gradually worsening shortness of breath over the past 4 days. Patient denies any chest pain, fevers, productive cough. She's been using her home medications with no relief. This morning her shortness breath, worse and she called EMS. She was given Solu-Medrol and nebulizer and round is feeling slightly better.    Past Medical History:  Diagnosis Date  . Allergy   . Anemia   . Anxiety   . Arthritis   . Blood dyscrasia    Sickle Cell traits  . Blood transfusion without reported diagnosis 1999   prbc S/P COLON SURGERY  . Breast cancer, right breast ([place]) 10/03/12  . Cancer ([place])    colon,renal,lung  . Cataract    NO SURGERY  . Chronic kidney disease 1999   RIGHT KIDNEY REMOVED  . Colon cancer ([place]) 1999, 2013  . Depression   . Diabetes mellitus    diet controlled  . GERD (gastroesophageal reflux disease)   . H/O: lung cancer   . Heart murmur   . Hernia    Near umbilicus  . History of colon cancer   . History of kidney cancer   . Hot flashes    5 yr pill for breast CA causing hot flashes  . HX: breast cancer 2014   --right breast  . Hypertension    Does not see a cardiologist  . Internal hemorrhoids   . Interstitial lung disease ([place]) 11/25/2015  . Lung cancer ([place]) 2001  . Neuromuscular disorder ([place])    PERIPHERAL NEUROPATHY FEET  . Shortness of breath    with exertion   . Sickle cell trait ([place])   . Tubular adenoma of colon   . Ulcer    HX YEARS AGO    Patient Active Problem List   Diagnosis Date Noted  . Interstitial lung disease ([place]) 11/25/2015  . Smoking history 10/27/2015  . Other emphysema ([place]) 10/27/2015  . Shortness of breath 10/27/2015  .  Chronic cough 10/27/2015  . Pulmonary hypertension ([place]) 10/27/2015  . Atypical lobular hyperplasia of breast 12/09/2014  . History of colon cancer 09/24/2014  . History of lung cancer 09/24/2014  . History of kidney cancer 09/24/2014  . Breast cancer of upper-outer quadrant of right female breast ([place])   . Colon cancer ([place])   . Incisional hernia 06/02/2012  . 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 Renal cell cancer ([place]) 04/20/2012  . COLONIC POLYPS, ADENOMATOUS 12/04/2007    Past Surgical History:  Procedure Laterality Date  . ABDOMINAL HYSTERECTOMY  80's   fibroids  . BREAST BIOPSY     Bilateral cysts/benign  . BREAST BIOPSY Right 10/03/12   ER/PR +  . BREAST BIOPSY Right 12/20/2012   Procedure: Right breast Excisional Biopsy;  Surgeon: Rolm Bookbinder, MD;  Location: [facility];  Service: General;  Laterality: Right;  . BREAST LUMPECTOMY WITH NEEDLE LOCALIZATION Right 12/20/2012   Procedure: BREAST LUMPECTOMY WITH NEEDLE LOCALIZATION;  Surgeon: Rolm Bookbinder, MD;  Location: [facility];  Service: General;  Laterality: Right;  . BREAST LUMPECTOMY WITH RADIOACTIVE SEED LOCALIZATION Right 01/19/2015   Procedure: RIGHT BREAST LUMPECTOMY WITH RADIOACTIVE SEED LOCALIZATION;  Surgeon: Rolm Bookbinder, MD;  Location: [facility];  Service: General;  Laterality: Right;  . CARDIAC CATHETERIZATION N/A 03/02/2016   Procedure: Right Heart Cath;  Surgeon: Jolaine Artist, MD;  Location: [facility];  Service: Cardiovascular;  Laterality: N/A;  . COLECTOMY  05-31-2012   laparoscopic ,possible open  . COLON SURGERY     chemo  . COLONOSCOPY    . [place]   right/chemo kidney removed for cancer  . LUNG LOBECTOMY     right lower  . PARTIAL COLECTOMY  05/31/2012   Procedure: PARTIAL COLECTOMY;  Surgeon: Rolm Bookbinder, MD;  Location: [facility];  Service: General;  Laterality: N/A;  . PARTIAL HYSTERECTOMY    . POLYPECTOMY    . RE-EXCISION OF  BREAST CANCER,SUPERIOR MARGINS Right 01/21/2013   Procedure: RE-EXCISION OF BREAST CANCER,SUPERIOR MARGINS;  Surgeon: Rolm Bookbinder, MD;  Location: [facility];  Service: General;  Laterality: Right;  . TONSILLECTOMY     as child  . TUBAL LIGATION      Current Outpatient Rx  . Order #: [id number] Class: Historical Med  . Order #: [id number] Class: Normal  . Order #: [id number] Class: Historical Med  . Order #: [id number] Class: Historical Med  . Order #: [id number] Class: Historical Med  . Order #: [id number] Class: Normal  . Order #: [id number] Class: Historical Med  . Order #: [id number] Class: Normal  . Order #: [id number] Class: Normal  . Order #: [id number] Class: Historical Med  . Order #: [id number] Class: Historical Med  . Order #: [id number] Class: Historical Med  . Order #: [id number] Class: Historical Med  . Order #: [id number] Class: Historical Med  . Order #: [id number] Class: Historical Med  . Order #: [id number] Class: Historical Med  . Order #: [id number] Class: Historical Med  . Order #: [id number] Class: Normal  . Order #: [id number] Class: Historical Med  . Order #: [id number] Class: Normal    Allergies Crab [shellfish allergy]; Iodine; Iohexol; Lactose intolerance (gi); Adhesive [tape]; and Latex  Family History  Problem Relation Age of Onset  . Colon polyps Brother   . Multiple myeloma Brother 2  . Breast cancer Mother        dx in her 49s  . Heart disease Father   . Colon cancer Paternal Aunt   . Breast cancer Maternal Grandmother   . Colon cancer Cousin        2 paternal first cousins  . Leukemia Cousin 11  . Esophageal cancer Neg Hx   . Rectal cancer Neg Hx   . Stomach cancer Neg Hx     Social History Social History  Substance Use Topics  . Smoking status: Former Smoker    Packs/day: 1.25    Years: 21.00    Types: Cigarettes    Start date: 10/30/1992    Quit date: 11/20/2013  . Smokeless tobacco: Never Used     Comment: quit then started up again 2years ago--smokes 1pack every 2-3 days    . Alcohol use No    Review of Systems  Constitutional: No fever/chills Eyes: No visual changes. ENT: No sore throat. Cardiovascular: Denies chest pain. Respiratory: Positive shortness of breath. Gastrointestinal: No abdominal pain.  No nausea, no vomiting.  No diarrhea.  No constipation. Genitourinary: Negative for dysuria. Musculoskeletal: Negative for back pain. Skin: Negative for rash. Neurological: Negative for headaches, focal weakness or numbness.  10-point ROS otherwise negative.  ____________________________________________   PHYSICAL EXAM:  VITAL SIGNS: ED Triage Vitals [12/08/16 0611]  Enc Vitals Group     BP (!) 153/87     Pulse Rate Marland Kitchen)  114     Resp (!) 29     Temp 99.9 F (37.7 C)     Temp Source Oral     SpO2 97 %   Constitutional: Alert and oriented. Well appearing and in no acute distress. Eyes: Conjunctivae are normal.  Head: Atraumatic. Nose: No congestion/rhinnorhea. [place] O2 in place.  Mouth/Throat: Mucous membranes are moist.  Oropharynx non-erythematous. Neck: No stridor.   Cardiovascular: Normal rate, regular rhythm. Good peripheral circulation. Grossly normal heart sounds.   Respiratory: Normal respiratory effort.  No retractions. Lungs with faint expiratory wheezing.  Gastrointestinal: Soft and nontender. No distention.  Musculoskeletal: No lower extremity tenderness nor edema. No gross deformities of extremities. Neurologic:  Normal speech and language. No gross focal neurologic deficits are appreciated.  Skin:  Skin is warm, dry and intact. No rash noted. ____________________________________________   LABS (all labs ordered are listed, but only abnormal results are displayed)  Labs Reviewed  BASIC METABOLIC PANEL - Abnormal; Notable for the following:       Result Value   Potassium 3.2 (*)    Chloride 94 (*)    Glucose, Bld 229 (*)    BUN 21 (*)    GFR calc non Af Amer 58 (*)    All other components within normal limits  CBC WITH  DIFFERENTIAL/PLATELET - Abnormal; Notable for the following:    Platelets 120 (*)    Lymphs Abs 0.5 (*)    All other components within normal limits  I-STAT TROPOININ, ED   ____________________________________________  EKG   EKG Interpretation  Date/Time:  Thursday Dec 08 2016 07:36:31 EDT Ventricular Rate:  114 PR Interval:    QRS Duration: 83 QT Interval:  329 QTC Calculation: 453 R Axis:   49 Text Interpretation:  Sinus tachycardia Right atrial enlargement No STEMI.  Confirmed by Nanda Quinton [phone]) on 12/08/2016 8:13:43 AM       ____________________________________________  RADIOLOGY  Dg Chest 2 View  Result Date: 12/08/2016 CLINICAL DATA:  Shortness of breath. EXAM: CHEST  2 VIEW COMPARISON:  CT 11/19/2015.  Chest x-ray 12/12/2012. FINDINGS: Mediastinum hilar structures normal. Cardiomegaly with pulmonary arterial prominence suggesting pulmonary hypertension again noted. Postsurgical changes right lung. Mild chronic interstitial prominence again noted. A component of active interstitial process cannot be excluded. No pleural effusion or pneumothorax. Surgical clips posterior abdomen. IMPRESSION: 1. Postsurgical changes right lung. Mild pulmonary interstitial prominence again noted consistent with chronic interstitial disease. A component of active interstitial pneumonitis or very mild interstitial edema cannot be excluded. 2. Cardiomegaly with prominent central pulmonary arteries again noted suggesting pulmonary hypertension. Electronically Signed   By: Marcello Moores  Register   On: 12/08/2016 07:25    ____________________________________________   PROCEDURES  Procedure(s) performed:   Procedures  None ____________________________________________   INITIAL IMPRESSION / ASSESSMENT AND PLAN / ED COURSE  Pertinent labs & imaging results that were available during my care of the patient were reviewed by me and considered in my medical decision making (see chart for  details).  Patient presents to the emergency department for evaluation of worsening shortness of breath in the setting of chronic interstitial lung disease. Patient feeling slightly better after 2 nebs. Chest x-ray with no evidence of pneumonia. No fevers or chills. Patient with no increased O2.   09:00 AM Patient feeling slightly better. I would like to ambulate prior to discharge. Will reassess afterwards.   09:34 AM Patient becomes very dyspneic with ambulation and O2 sat drops to 88% on 2L [place]. Patient continues  to look SOB even with resting after ambulation. Plan for observational admission for continued breathing treatments and O2 titration PRN. Patient reports that at baseline she is able to walk around her house and go to the grocery store without significant dyspnea.   Discussed patient's case with hospitalist. Patient and family (if present) updated with plan. Care transferred to hospitalist service.  I reviewed all nursing notes, vitals, pertinent old records, EKGs, labs, imaging (as available).  ____________________________________________  FINAL CLINICAL IMPRESSION(S) / ED DIAGNOSES  Final diagnoses:  SOB (shortness of breath)  Hypoxemia     MEDICATIONS GIVEN DURING THIS VISIT:  Medications  ipratropium-albuterol (DUONEB) 0.5-2.5 (3) MG/3ML nebulizer solution 3 mL (3 mLs Nebulization Given 12/08/16 0645)  ipratropium-albuterol (DUONEB) 0.5-2.5 (3) MG/3ML nebulizer solution 3 mL (3 mLs Nebulization Given 12/08/16 0802)     NEW OUTPATIENT MEDICATIONS STARTED DURING THIS VISIT:  None   Note:  This document was prepared using Dragon voice recognition software and may include unintentional dictation errors.  Nanda Quinton, MD Emergency Medicine   Karryn Kosinski, [facility], MD 12/08/16 1019

## 2016-12-08 NOTE — ED Notes (Signed)
Bed: FM40 Expected date:  Expected time:  Means of arrival:  Comments: EMS/SOB/COPD/duonebs

## 2016-12-08 NOTE — ED Notes (Signed)
Pt family is at bedside

## 2016-12-09 DIAGNOSIS — J9621 Acute and chronic respiratory failure with hypoxia: Secondary | ICD-10-CM | POA: Diagnosis present

## 2016-12-09 DIAGNOSIS — J849 Interstitial pulmonary disease, unspecified: Secondary | ICD-10-CM | POA: Diagnosis not present

## 2016-12-09 DIAGNOSIS — Z9981 Dependence on supplemental oxygen: Secondary | ICD-10-CM | POA: Diagnosis not present

## 2016-12-09 DIAGNOSIS — R0902 Hypoxemia: Secondary | ICD-10-CM | POA: Diagnosis not present

## 2016-12-09 DIAGNOSIS — Z905 Acquired absence of kidney: Secondary | ICD-10-CM | POA: Diagnosis not present

## 2016-12-09 DIAGNOSIS — J441 Chronic obstructive pulmonary disease with (acute) exacerbation: Principal | ICD-10-CM

## 2016-12-09 DIAGNOSIS — F17210 Nicotine dependence, cigarettes, uncomplicated: Secondary | ICD-10-CM | POA: Diagnosis present

## 2016-12-09 DIAGNOSIS — D696 Thrombocytopenia, unspecified: Secondary | ICD-10-CM | POA: Diagnosis present

## 2016-12-09 DIAGNOSIS — Z8249 Family history of ischemic heart disease and other diseases of the circulatory system: Secondary | ICD-10-CM | POA: Diagnosis not present

## 2016-12-09 DIAGNOSIS — Z800 Family history of malignant neoplasm of digestive organs: Secondary | ICD-10-CM | POA: Diagnosis not present

## 2016-12-09 DIAGNOSIS — Z85038 Personal history of other malignant neoplasm of large intestine: Secondary | ICD-10-CM | POA: Diagnosis not present

## 2016-12-09 DIAGNOSIS — I10 Essential (primary) hypertension: Secondary | ICD-10-CM | POA: Diagnosis present

## 2016-12-09 DIAGNOSIS — Z803 Family history of malignant neoplasm of breast: Secondary | ICD-10-CM | POA: Diagnosis not present

## 2016-12-09 DIAGNOSIS — E1165 Type 2 diabetes mellitus with hyperglycemia: Secondary | ICD-10-CM | POA: Diagnosis present

## 2016-12-09 DIAGNOSIS — F419 Anxiety disorder, unspecified: Secondary | ICD-10-CM | POA: Diagnosis present

## 2016-12-09 DIAGNOSIS — D573 Sickle-cell trait: Secondary | ICD-10-CM | POA: Diagnosis present

## 2016-12-09 DIAGNOSIS — T380X5A Adverse effect of glucocorticoids and synthetic analogues, initial encounter: Secondary | ICD-10-CM | POA: Diagnosis not present

## 2016-12-09 DIAGNOSIS — Z85118 Personal history of other malignant neoplasm of bronchus and lung: Secondary | ICD-10-CM | POA: Diagnosis not present

## 2016-12-09 DIAGNOSIS — Z853 Personal history of malignant neoplasm of breast: Secondary | ICD-10-CM | POA: Diagnosis not present

## 2016-12-09 DIAGNOSIS — Z9221 Personal history of antineoplastic chemotherapy: Secondary | ICD-10-CM | POA: Diagnosis not present

## 2016-12-09 DIAGNOSIS — Z85528 Personal history of other malignant neoplasm of kidney: Secondary | ICD-10-CM | POA: Diagnosis not present

## 2016-12-09 DIAGNOSIS — K219 Gastro-esophageal reflux disease without esophagitis: Secondary | ICD-10-CM | POA: Diagnosis present

## 2016-12-09 DIAGNOSIS — Z90710 Acquired absence of both cervix and uterus: Secondary | ICD-10-CM | POA: Diagnosis not present

## 2016-12-09 DIAGNOSIS — J8489 Other specified interstitial pulmonary diseases: Secondary | ICD-10-CM | POA: Diagnosis present

## 2016-12-09 DIAGNOSIS — Z8371 Family history of colonic polyps: Secondary | ICD-10-CM | POA: Diagnosis not present

## 2016-12-09 DIAGNOSIS — C3491 Malignant neoplasm of unspecified part of right bronchus or lung: Secondary | ICD-10-CM | POA: Diagnosis not present

## 2016-12-09 DIAGNOSIS — C50411 Malignant neoplasm of upper-outer quadrant of right female breast: Secondary | ICD-10-CM | POA: Diagnosis not present

## 2016-12-09 DIAGNOSIS — I272 Pulmonary hypertension, unspecified: Secondary | ICD-10-CM | POA: Diagnosis not present

## 2016-12-09 LAB — BASIC METABOLIC PANEL
Anion gap: 13 (ref 5–15)
BUN: 20 mg/dL (ref 6–20)
CALCIUM: 9.5 mg/dL (ref 8.9–10.3)
CO2: 30 mmol/L (ref 22–32)
CREATININE: 0.88 mg/dL (ref 0.44–1.00)
Chloride: 96 mmol/L — ABNORMAL LOW (ref 101–111)
Glucose, Bld: 278 mg/dL — ABNORMAL HIGH (ref 65–99)
Potassium: 3.7 mmol/L (ref 3.5–5.1)
SODIUM: 139 mmol/L (ref 135–145)

## 2016-12-09 LAB — CBC
HCT: 39.5 % (ref 36.0–46.0)
Hemoglobin: 12.7 g/dL (ref 12.0–15.0)
MCH: 28 pg (ref 26.0–34.0)
MCHC: 32.2 g/dL (ref 30.0–36.0)
MCV: 87 fL (ref 78.0–100.0)
PLATELETS: 121 10*3/uL — AB (ref 150–400)
RBC: 4.54 MIL/uL (ref 3.87–5.11)
RDW: 15.3 % (ref 11.5–15.5)
WBC: 8.2 10*3/uL (ref 4.0–10.5)

## 2016-12-09 LAB — GLUCOSE, CAPILLARY
GLUCOSE-CAPILLARY: 272 mg/dL — AB (ref 65–99)
GLUCOSE-CAPILLARY: 197 mg/dL — AB (ref 65–99)
GLUCOSE-CAPILLARY: 245 mg/dL — AB (ref 65–99)
GLUCOSE-CAPILLARY: 298 mg/dL — AB (ref 65–99)

## 2016-12-09 MED ORDER — ENOXAPARIN SODIUM 60 MG/0.6ML ~~LOC~~ SOLN: 60.0000 mg | SUBCUTANEOUS | Status: DC

## 2016-12-09 MED ORDER — HYDRALAZINE HCL 20 MG/ML IJ SOLN
5.0000 mg | INTRAMUSCULAR | Status: DC | PRN
  Administered 2016-12-09 – 2016-12-11 (×4): 5 mg via INTRAVENOUS
  Filled 2016-12-09 (×4): qty 1

## 2016-12-09 MED ORDER — ENOXAPARIN SODIUM 40 MG/0.4ML ~~LOC~~ SOLN
40.0000 mg | SUBCUTANEOUS | Status: DC
  Administered 2016-12-09 – 2016-12-11 (×3): 40 mg via SUBCUTANEOUS
  Filled 2016-12-09 (×3): qty 0.4

## 2016-12-09 MED ORDER — BUDESONIDE 0.5 MG/2ML IN SUSP
0.5000 mg | Freq: Two times a day (BID) | RESPIRATORY_TRACT | Status: DC
  Administered 2016-12-09 – 2016-12-12 (×7): 0.5 mg via RESPIRATORY_TRACT
  Filled 2016-12-09 (×8): qty 2

## 2016-12-09 MED ORDER — LEVOFLOXACIN 500 MG PO TABS
500.0000 mg | ORAL_TABLET | Freq: Every day | ORAL | Status: DC
  Administered 2016-12-09 – 2016-12-12 (×4): 500 mg via ORAL
  Filled 2016-12-09 (×4): qty 1

## 2016-12-09 MED ORDER — GUAIFENESIN ER 600 MG PO TB12
1200.0000 mg | ORAL_TABLET | Freq: Two times a day (BID) | ORAL | Status: DC
  Administered 2016-12-09 – 2016-12-12 (×7): 1200 mg via ORAL
  Filled 2016-12-09 (×7): qty 2

## 2016-12-09 MED ORDER — HYDROCOD POLST-CPM POLST ER 10-8 MG/5ML PO SUER
5.0000 mL | Freq: Two times a day (BID) | ORAL | Status: DC
  Administered 2016-12-09 – 2016-12-12 (×7): 5 mL via ORAL
  Filled 2016-12-09 (×7): qty 5

## 2016-12-09 MED ORDER — IPRATROPIUM-ALBUTEROL 0.5-2.5 (3) MG/3ML IN SOLN
3.0000 mL | Freq: Three times a day (TID) | RESPIRATORY_TRACT | Status: DC
  Administered 2016-12-09 – 2016-12-12 (×9): 3 mL via RESPIRATORY_TRACT
  Filled 2016-12-09 (×10): qty 3

## 2016-12-09 MED ORDER — BENZONATATE 100 MG PO CAPS
100.0000 mg | ORAL_CAPSULE | Freq: Three times a day (TID) | ORAL | Status: DC
  Administered 2016-12-09 – 2016-12-12 (×11): 100 mg via ORAL
  Filled 2016-12-09 (×11): qty 1

## 2016-12-09 NOTE — Care Management Note (Signed)
Case Management Note  Patient Details  Name: RUSTY GLODOWSKI MRN: [id number] Date of Birth: 06/21/1946  Subjective/Objective:                  71 y.o. female with medical history significant of COPD on home O2, interstitial lung disease, mild pulmonary hypertension, hypertension, morbid obesity, history of multiple malignancies (lung, breast, colon, kidney) presented to the hospital for gradually worsening shortness of breath. Per patient, over the past weekend, she developed sore throat followed by a mostly dry cough. This was associated with some stuffiness of her nose. She claims to have some associated subjective fever as well. With these symptoms, patient steadily started having gradual worsening of her shortness of breath. She called her pulmonologist's office, and was prescribed a steroid taper and antibiotics-even with these measures, her shortness of breath continued to worsen, this morning she was hardly able to even ambulate to the bathroom which was 10 feet away. As a result she was brought to the emergency room for further evaluation and treatment. In the emergency room, patient was provided with steroids, numerous bronchodilators-with these measures patient improved, she was able to ambulate half of the ED unit-but then still did not feel that she was back to her baseline and became short of breath.   Action/Plan: Date:  Dec 09, 2016  Chart reviewed for concurrent status and case management needs.  Will continue to follow patient progress.  Discharge Planning: following for needs  Expected discharge date: [id number]  Velva Harman, BSN, [place], [place]   Expected Discharge Date:   (unknown)               Expected Discharge Plan:  Home/Self Care  In-House Referral:     Discharge planning Services  CM Consult  Post Acute Care Choice:    Choice offered to:     DME Arranged:    DME Agency:     HH Arranged:    Carrier Agency:     Status of Service:  In process, will continue to  follow  If discussed at Long Length of Stay Meetings, dates discussed:    Additional Comments:  Leeroy Cha, RN 12/09/2016, 9:47 AM

## 2016-12-09 NOTE — Progress Notes (Signed)
Inpatient Diabetes Program Recommendations  AACE/ADA: New Consensus Statement on Inpatient Glycemic Control (2015)  Target Ranges:  Prepandial:   less than 140 mg/dL      Peak postprandial:   less than 180 mg/dL (1-2 hours)      Critically ill patients:  140 - 180 mg/dL   Lab Results  Component Value Date   GLUCAP 272 (H) 12/09/2016    Review of Glycemic Control  Diabetes history: DM2 Outpatient Diabetes medications: metformin 500 QAM Current orders for Inpatient glycemic control: Novolog 0-15 units tidwc  On Solumedrol 60 units Q12H  Inpatient Diabetes Program Recommendations:    Need HgbA1C to assess glycemic control prior to admission. Add HS correction.  Will continue to follow. Thank you. Lorenda Peck, RD, LDN, CDE Inpatient Diabetes Coordinator [phone]

## 2016-12-09 NOTE — Progress Notes (Signed)
Amy [name]   CVE:[id number]  DOB: [birth year]  DOA: 12/08/2016 PCP: Seward Carol, MD   Brief Narrative:  71 y/o female with COPD, ILD on 2 L of O2 at home, mild pulm HTN, multiple malignancies (lungs, breast, colon, kidney). She presents with dyspnea and cough for about 3-4 days now. She began Prednisone and Keflex as prescribed by her pulmonologist on Tuesday but she continued to worsen and therefore presented to the ER.  Subjective: Breathing and cough is only slightly better than yesterday. No chest pain or fever.   Assessment & Plan:   Principal Problem:   COPD exacerbation - cont Solumedrol at current dose with nebs. Add Pulmicort, increase Mucinex, Tessalon and Tussionex routine.   Active Problems:   Interstitial lung disease - chronically on 2 L O2  DM2 - sugars sightly elevated due to steroids  Thrombocytopenia - chronic  History of the following:   Non-small cell cancer of right lung (H[facility])   Renal cell cancer ([facility])   Breast cancer of upper-outer quadrant of right female breast ([place])   History of colon cancer    Pulmonary hypertension    DVT prophylaxis: Lovenox Code Status: Full code Family Communication:  Disposition Plan: home when stable Consultants:    Procedures:    Antimicrobials:  Anti-infectives    Start     Dose/Rate Route Frequency Ordered Stop   12/09/16 1100  levofloxacin (LEVAQUIN) tablet 500 mg     500 mg Oral Daily 12/09/16 0952     12/08/16 1300  levofloxacin (LEVAQUIN) IVPB 500 mg  Status:  Discontinued     500 mg 100 mL/hr over 60 Minutes Intravenous Every 24 hours 12/08/16 1241 12/09/16 0952       Objective: Vitals:   12/08/16 2237 12/09/16 0548 12/09/16 0628 12/09/16 0914  BP: 140/84 135/69 (!) 166/90   Pulse: (!) 105 95    Resp:      Temp:  98.8 F (37.1 C)    TempSrc:  Oral    SpO2:  96%  (!) 89%  Weight:      Height:        Intake/Output Summary (Last 24 hours) at 12/09/16 1301 Last data  filed at 12/09/16 1014  Gross per 24 hour  Intake           694.33 ml  Output                0 ml  Net           694.33 ml   Filed Weights   12/08/16 1320  Weight: 117.5 kg (259 lb)    Examination: General exam: Appears comfortable  HEENT: PERRLA, oral mucosa moist, no sclera icterus or thrush Respiratory system: Clear to auscultation. Respiratory effort normal. Cardiovascular system: S1 & S2 heard, RRR.  No murmurs  Gastrointestinal system: Abdomen soft, non-tender, nondistended. Normal bowel sound. No organomegaly Central nervous system: Alert and oriented. No focal neurological deficits. Extremities: No cyanosis, clubbing or edema Skin: No rashes or ulcers Psychiatry:  Mood & affect appropriate.   Data Reviewed: I have personally reviewed following labs and imaging studies  CBC:  Recent Labs Lab 12/08/16 0805 12/09/16 0649  WBC 7.6 8.2  NEUTROABS 6.8  --   HGB 13.0 12.7  HCT 39.2 39.5  MCV 87.3 87.0  PLT 120* 025*   Basic Metabolic Panel:  Recent Labs Lab 12/08/16 0805 12/09/16 0649  NA 136 139  K 3.2* 3.7  CL 94*  96*  CO2 28 30  GLUCOSE 229* 278*  BUN 21* 20  CREATININE 0.97 0.88  CALCIUM 9.3 9.5   GFR: Estimated Creatinine Clearance: 76.3 mL/min (by C-G formula based on SCr of 0.88 mg/dL). Liver Function Tests: No results for input(s): AST, ALT, ALKPHOS, BILITOT, PROT, ALBUMIN in the last 168 hours. No results for input(s): LIPASE, AMYLASE in the last 168 hours. No results for input(s): AMMONIA in the last 168 hours. Coagulation Profile: No results for input(s): INR, PROTIME in the last 168 hours. Cardiac Enzymes: No results for input(s): CKTOTAL, CKMB, CKMBINDEX, TROPONINI in the last 168 hours. BNP (last 3 results) No results for input(s): PROBNP in the last 8760 hours. HbA1C: No results for input(s): HGBA1C in the last 72 hours. CBG:  Recent Labs Lab 12/08/16 1319 12/08/16 1712 12/08/16 2047 12/09/16 0729 12/09/16 1248  GLUCAP 270*  327* 221* 272* 197*   Lipid Profile: No results for input(s): CHOL, HDL, LDLCALC, TRIG, CHOLHDL, LDLDIRECT in the last 72 hours. Thyroid Function Tests: No results for input(s): TSH, T4TOTAL, FREET4, T3FREE, THYROIDAB in the last 72 hours. Anemia Panel: No results for input(s): VITAMINB12, FOLATE, FERRITIN, TIBC, IRON, RETICCTPCT in the last 72 hours. Urine analysis:    Component Value Date/Time   COLORURINE YELLOW 04/24/2007 1050   APPEARANCEUR HAZY (A) 04/24/2007 1050   LABSPEC 1.016 04/24/2007 1050   PHURINE 6.5 04/24/2007 1050   GLUCOSEU NEGATIVE 04/24/2007 1050   HGBUR NEGATIVE 04/24/2007 [place] 04/24/2007 1050   KETONESUR NEGATIVE 04/24/2007 1050   PROTEINUR NEGATIVE 04/24/2007 1050   UROBILINOGEN 1.0 04/24/2007 1050   NITRITE NEGATIVE 04/24/2007 1050   LEUKOCYTESUR SMALL (A) 04/24/2007 1050   Sepsis Labs: '@LABRCNTIP'$ (procalcitonin:4,lacticidven:4) )No results found for this or any previous visit (from the past 240 hour(s)).       Radiology Studies: Dg Chest 2 View  Result Date: 12/08/2016 CLINICAL DATA:  Shortness of breath. EXAM: CHEST  2 VIEW COMPARISON:  CT 11/19/2015.  Chest x-ray 12/12/2012. FINDINGS: Mediastinum hilar structures normal. Cardiomegaly with pulmonary arterial prominence suggesting pulmonary hypertension again noted. Postsurgical changes right lung. Mild chronic interstitial prominence again noted. A component of active interstitial process cannot be excluded. No pleural effusion or pneumothorax. Surgical clips posterior abdomen. IMPRESSION: 1. Postsurgical changes right lung. Mild pulmonary interstitial prominence again noted consistent with chronic interstitial disease. A component of active interstitial pneumonitis or very mild interstitial edema cannot be excluded. 2. Cardiomegaly with prominent central pulmonary arteries again noted suggesting pulmonary hypertension. Electronically Signed   By: Marcello Moores  Register   On: 12/08/2016  07:25      Scheduled Meds: . ALPRAZolam  0.5 mg Oral BID  . atorvastatin  20 mg Oral QAC breakfast  . benzonatate  100 mg Oral TID  . budesonide (PULMICORT) nebulizer solution  0.5 mg Nebulization BID  . calcium-vitamin D  1 tablet Oral Once per day on Mon Wed Fri  . chlorpheniramine-HYDROcodone  5 mL Oral Q12H  . enoxaparin (LOVENOX) injection  40 mg Subcutaneous Q24H  . exemestane  25 mg Oral QPC breakfast  . ferrous gluconate  324 mg Oral Q breakfast  . fluticasone  2 spray Each Nare Daily  . guaiFENesin  1,200 mg Oral BID  . hydrochlorothiazide  25 mg Oral QAC breakfast  . insulin aspart  0-15 Units Subcutaneous TID WC  . ipratropium-albuterol  3 mL Nebulization QID  . levofloxacin  500 mg Oral Daily  . loratadine  10 mg Oral Daily  . losartan  50 mg Oral Daily  . methylPREDNISolone (SOLU-MEDROL) injection  60 mg Intravenous Q12H  . metoprolol tartrate  100 mg Oral QAC breakfast  . oxymetazoline  1 spray Each Nare BID  . pantoprazole  40 mg Oral Daily  . PARoxetine  40 mg Oral QAC breakfast  . sodium chloride flush  3 mL Intravenous Q12H  . traMADol  50 mg Oral BID  . vitamin C with rose hips  500 mg Oral q morning - 10a   Continuous Infusions: . sodium chloride 250 mL (12/08/16 1316)     LOS: 0 days    Time spent in minutes: 35    Debbe Odea, MD [facility] Pager: [email or web address] Password [email or web address] 12/09/2016, 1:01 PM

## 2016-12-10 DIAGNOSIS — C50411 Malignant neoplasm of upper-outer quadrant of right female breast: Secondary | ICD-10-CM

## 2016-12-10 DIAGNOSIS — C3491 Malignant neoplasm of unspecified part of right bronchus or lung: Secondary | ICD-10-CM

## 2016-12-10 DIAGNOSIS — I272 Pulmonary hypertension, unspecified: Secondary | ICD-10-CM

## 2016-12-10 LAB — GLUCOSE, CAPILLARY
GLUCOSE-CAPILLARY: 262 mg/dL — AB (ref 65–99)
GLUCOSE-CAPILLARY: 370 mg/dL — AB (ref 65–99)
Glucose-Capillary: 203 mg/dL — ABNORMAL HIGH (ref 65–99)
Glucose-Capillary: 259 mg/dL — ABNORMAL HIGH (ref 65–99)

## 2016-12-10 MED ORDER — INSULIN GLARGINE 100 UNIT/ML ~~LOC~~ SOLN
8.0000 [IU] | Freq: Once | SUBCUTANEOUS | Status: AC
  Administered 2016-12-10: 8 [IU] via SUBCUTANEOUS
  Filled 2016-12-10: qty 0.08

## 2016-12-10 NOTE — Progress Notes (Signed)
Pt ambulated in hall about 100 feet with 2L O2 via [place].   Pt at rest on O2- O2 sat 96% HR 98 bpm  Pt ambulated- O2 sats dropped to 86% with HR of 110 bpm and oxygen increased to 3L o2 sat retuned to 94% with HR of 106 bpm  Pt resting after ambulating o2 sat at 95% back on 2L and HR 100 bpm  Pt stated "I feel much better than before I came but I still feel a little out of breath with walking."   O2 probe does have poor connection due to patients long nails with dark color.

## 2016-12-10 NOTE — Progress Notes (Signed)
PROGRESS NOTE    Amy [name]   VZC:[id number]  DOB: [birth year]  DOA: 12/08/2016 PCP: Seward Carol, MD   Brief Narrative:  71 y/o female with COPD, ILD on 2 L of O2 at home, mild pulm HTN, multiple malignancies (lungs, breast, colon, kidney). She presents with dyspnea and cough for about 3-4 days now. She began Prednisone and Keflex as prescribed by her pulmonologist on Tuesday but she continued to worsen and therefore presented to the ER.  Subjective: Continues to feel short of breath and has ongoing cough. Not much better than yesterday.   Assessment & Plan:   Principal Problem:   COPD exacerbation - cont Solumedrol at current dose with nebs. Added Pulmicort, increase Mucinex, Tessalon and Tussionex routine.  - begin to ambulate in the hall today and follow for hypoxia- will not wean steroids yet.  Active Problems:   Interstitial lung disease/  Pulmonary hypertension   - chronically on 2 L O2  DM2 - sugars sightly elevated due to steroids - start Lantus today  Thrombocytopenia - chronic  History of the following:   Non-small cell cancer of right lung (HCC)   Renal cell cancer (HCC)   Breast cancer of upper-outer quadrant of right female breast ([place])   History of colon cancer  DVT prophylaxis: Lovenox Code Status: Full code Family Communication:  Disposition Plan: home when stable Consultants:    Procedures:    Antimicrobials:  Anti-infectives    Start     Dose/Rate Route Frequency Ordered Stop   12/09/16 1100  levofloxacin (LEVAQUIN) tablet 500 mg     500 mg Oral Daily 12/09/16 0952     12/08/16 1300  levofloxacin (LEVAQUIN) IVPB 500 mg  Status:  Discontinued     500 mg 100 mL/hr over 60 Minutes Intravenous Every 24 hours 12/08/16 1241 12/09/16 0952       Objective: Vitals:   12/09/16 2038 12/10/16 0444 12/10/16 0451 12/10/16 0750  BP:  (!) 166/108 (!) 166/108   Pulse:  72    Resp:  15    Temp:  98.6 F (37 C)    TempSrc:  Oral    SpO2: 94%  97%  90%  Weight:      Height:        Intake/Output Summary (Last 24 hours) at 12/10/16 1347 Last data filed at 12/10/16 1287  Gross per 24 hour  Intake              560 ml  Output                0 ml  Net              560 ml   Filed Weights   12/08/16 1320  Weight: 117.5 kg (259 lb)    Examination: General exam: Appears comfortable  HEENT: PERRLA, oral mucosa moist, no sclera icterus or thrush Respiratory system: wheezing in left lung field- Respiratory effort normal. Cardiovascular system: S1 & S2 heard, RRR.  No murmurs  Gastrointestinal system: Abdomen soft, non-tender, nondistended. Normal bowel sound. No organomegaly Central nervous system: Alert and oriented. No focal neurological deficits. Extremities: No cyanosis, clubbing or edema Skin: No rashes or ulcers Psychiatry:  Mood & affect appropriate.   Data Reviewed: I have personally reviewed following labs and imaging studies  CBC:  Recent Labs Lab 12/08/16 0805 12/09/16 0649  WBC 7.6 8.2  NEUTROABS 6.8  --   HGB 13.0 12.7  HCT 39.2 39.5  MCV 87.3 87.0  PLT 120* 161*   Basic Metabolic Panel:  Recent Labs Lab 12/08/16 0805 12/09/16 0649  NA 136 139  K 3.2* 3.7  CL 94* 96*  CO2 28 30  GLUCOSE 229* 278*  BUN 21* 20  CREATININE 0.97 0.88  CALCIUM 9.3 9.5   GFR: Estimated Creatinine Clearance: 76.3 mL/min (by C-G formula based on SCr of 0.88 mg/dL). Liver Function Tests: No results for input(s): AST, ALT, ALKPHOS, BILITOT, PROT, ALBUMIN in the last 168 hours. No results for input(s): LIPASE, AMYLASE in the last 168 hours. No results for input(s): AMMONIA in the last 168 hours. Coagulation Profile: No results for input(s): INR, PROTIME in the last 168 hours. Cardiac Enzymes: No results for input(s): CKTOTAL, CKMB, CKMBINDEX, TROPONINI in the last 168 hours. BNP (last 3 results) No results for input(s): PROBNP in the last 8760 hours. HbA1C: No results for input(s): HGBA1C in the last 72  hours. CBG:  Recent Labs Lab 12/09/16 1248 12/09/16 1702 12/09/16 2047 12/10/16 0737 12/10/16 1132  GLUCAP 197* 245* 298* 262* 370*   Lipid Profile: No results for input(s): CHOL, HDL, LDLCALC, TRIG, CHOLHDL, LDLDIRECT in the last 72 hours. Thyroid Function Tests: No results for input(s): TSH, T4TOTAL, FREET4, T3FREE, THYROIDAB in the last 72 hours. Anemia Panel: No results for input(s): VITAMINB12, FOLATE, FERRITIN, TIBC, IRON, RETICCTPCT in the last 72 hours. Urine analysis:    Component Value Date/Time   COLORURINE YELLOW 04/24/2007 1050   APPEARANCEUR HAZY (A) 04/24/2007 1050   LABSPEC 1.016 04/24/2007 1050   PHURINE 6.5 04/24/2007 1050   GLUCOSEU NEGATIVE 04/24/2007 1050   HGBUR NEGATIVE 04/24/2007 [place] 04/24/2007 1050   KETONESUR NEGATIVE 04/24/2007 1050   PROTEINUR NEGATIVE 04/24/2007 1050   UROBILINOGEN 1.0 04/24/2007 1050   NITRITE NEGATIVE 04/24/2007 1050   LEUKOCYTESUR SMALL (A) 04/24/2007 1050   Sepsis Labs: '@LABRCNTIP'$ (procalcitonin:4,lacticidven:4) )No results found for this or any previous visit (from the past 240 hour(s)).       Radiology Studies: No results found.    Scheduled Meds: . ALPRAZolam  0.5 mg Oral BID  . atorvastatin  20 mg Oral QAC breakfast  . benzonatate  100 mg Oral TID  . budesonide (PULMICORT) nebulizer solution  0.5 mg Nebulization BID  . calcium-vitamin D  1 tablet Oral Once per day on Mon Wed Fri  . chlorpheniramine-HYDROcodone  5 mL Oral Q12H  . enoxaparin (LOVENOX) injection  40 mg Subcutaneous Q24H  . exemestane  25 mg Oral QPC breakfast  . ferrous gluconate  324 mg Oral Q breakfast  . fluticasone  2 spray Each Nare Daily  . guaiFENesin  1,200 mg Oral BID  . hydrochlorothiazide  25 mg Oral QAC breakfast  . insulin aspart  0-15 Units Subcutaneous TID WC  . ipratropium-albuterol  3 mL Nebulization TID  . levofloxacin  500 mg Oral Daily  . loratadine  10 mg Oral Daily  . losartan  50 mg Oral  Daily  . methylPREDNISolone (SOLU-MEDROL) injection  60 mg Intravenous Q12H  . metoprolol tartrate  100 mg Oral QAC breakfast  . oxymetazoline  1 spray Each Nare BID  . pantoprazole  40 mg Oral Daily  . PARoxetine  40 mg Oral QAC breakfast  . sodium chloride flush  3 mL Intravenous Q12H  . traMADol  50 mg Oral BID  . vitamin C with rose hips  500 mg Oral q morning - 10a   Continuous Infusions: . sodium chloride 250 mL (12/08/16 1316)  LOS: 1 day    Time spent in minutes: 35    Debbe Odea, MD [facility] Pager: [email or web address] Password [email or web address] 12/10/2016, 1:47 PM

## 2016-12-11 DIAGNOSIS — E118 Type 2 diabetes mellitus with unspecified complications: Secondary | ICD-10-CM

## 2016-12-11 DIAGNOSIS — R0902 Hypoxemia: Secondary | ICD-10-CM

## 2016-12-11 LAB — GLUCOSE, CAPILLARY
GLUCOSE-CAPILLARY: 436 mg/dL — AB (ref 65–99)
GLUCOSE-CAPILLARY: 201 mg/dL — AB (ref 65–99)
GLUCOSE-CAPILLARY: 271 mg/dL — AB (ref 65–99)
Glucose-Capillary: 218 mg/dL — ABNORMAL HIGH (ref 65–99)
Glucose-Capillary: 232 mg/dL — ABNORMAL HIGH (ref 65–99)
Glucose-Capillary: 197 mg/dL — ABNORMAL HIGH (ref 65–99)

## 2016-12-11 MED ORDER — BENZONATATE 100 MG PO CAPS: 100.0000 mg | ORAL_CAPSULE | Freq: Three times a day (TID) | ORAL | 0 refills | Status: DC

## 2016-12-11 MED ORDER — PREDNISONE 20 MG PO TABS
40.0000 mg | ORAL_TABLET | Freq: Every day | ORAL | Status: DC
  Administered 2016-12-12: 40 mg via ORAL
  Filled 2016-12-11: qty 2

## 2016-12-11 MED ORDER — LORATADINE 10 MG PO TABS: 10.0000 mg | ORAL_TABLET | Freq: Every day | ORAL | 0 refills | Status: DC

## 2016-12-11 MED ORDER — FLUTICASONE PROPIONATE 50 MCG/ACT NA SUSP: 2.0000 | Freq: Every day | NASAL | 2 refills | Status: DC

## 2016-12-11 MED ORDER — GUAIFENESIN ER 600 MG PO TB12
600.0000 mg | ORAL_TABLET | Freq: Two times a day (BID) | ORAL | 0 refills | Status: DC
Start: 2016-12-11 — End: 2016-12-11

## 2016-12-11 MED ORDER — HYDROCOD POLST-CPM POLST ER 10-8 MG/5ML PO SUER: 5.0000 mL | Freq: Two times a day (BID) | ORAL | 0 refills | Status: DC

## 2016-12-11 MED ORDER — PREDNISONE 10 MG PO TABS: ORAL_TABLET | ORAL | 0 refills | Status: DC

## 2016-12-11 MED ORDER — PREDNISONE 50 MG PO TABS
60.0000 mg | ORAL_TABLET | Freq: Every day | ORAL | Status: DC
  Administered 2016-12-11: 60 mg via ORAL
  Filled 2016-12-11: qty 1

## 2016-12-11 MED ORDER — GUAIFENESIN ER 600 MG PO TB12: 600.0000 mg | ORAL_TABLET | Freq: Two times a day (BID) | ORAL | 0 refills | Status: DC

## 2016-12-11 MED ORDER — LEVOFLOXACIN 500 MG PO TABS: 500.0000 mg | ORAL_TABLET | Freq: Every day | ORAL | 0 refills | Status: DC

## 2016-12-11 MED ORDER — LEVOFLOXACIN 500 MG PO TABS
500.0000 mg | ORAL_TABLET | Freq: Every day | ORAL | 0 refills | Status: DC
Start: 2016-12-12 — End: 2016-12-11

## 2016-12-11 NOTE — Discharge Summary (Addendum)
Physician Discharge Summary  Amy [name]:[id number] DOB: Jan 20, 1946 DOA: 12/08/2016  PCP: Seward Carol, MD  Admit date: 12/08/2016 Discharge date: 12/12/2016  Admitted From: home Disposition:  home   Recommendations for Outpatient Follow-up:  1. F/u on ambulatory pulse ox- we have increased her ambulatory FiO2 to 3    Discharge Condition:  stable   CODE STATUS:  Full code   Consultations:  none    Discharge Diagnoses:  Principal Problem:   COPD exacerbation ([place]) Active Problems:   Pulmonary hypertension (HCC)   Interstitial lung disease ([place])   Non-small cell cancer of right lung (HCC)   Renal cell cancer (HCC)   Breast cancer of upper-outer quadrant of right female breast ([place])   History of colon cancer   DM (diabetes mellitus), type 2 (HCC)    Subjective: Cough and dyspnea improving. She is able to ambulated down the hall without much difficulty.   Brief Summary: 71 y/o female with COPD, ILD on 2 L of O2 at home, mild pulm HTN, multiple malignancies (lungs, breast, colon, kidney). She presents with dyspnea and cough for about 3-4 days now. She began Prednisone and Keflex as prescribed by her pulmonologist on Tuesday but she continued to worsen and therefore presented to the ER.   Hospital Course:  Principal Problem:   COPD exacerbation - treated with Solumedrol, Pulmicort, Mucinex, Tessalon and Tussionex and Levaquin -  Now able to ambulate in the hall with increased FiO2 to 3 L - symptoms have improved considerably- no further exertional dyspnea and now wheezing.  - d/c home with Prednisone taper, Levaquin, Tessalon, Tussionex and Mucinex - we have had to increase her O2 on exertion to 3 L on ambulation for now  Active Problems:   Interstitial lung disease/  Pulmonary hypertension   - chronically on 2 L O2 at baseline  DM2 - sugars sightly elevated due to steroids and also poor dietary control (noted during hospital stay)  Thrombocytopenia -  chronic  History of the following:   Non-small cell cancer of right lung (HCC)   Renal cell cancer ([place])   Breast cancer of upper-outer quadrant of right female breast ([place])   History of colon cancer  Discharge Instructions  Discharge Instructions    Diet - low sodium heart healthy    Complete by:  As directed    Diet Carb Modified    Complete by:  As directed    Increase activity slowly    Complete by:  As directed      Allergies as of 12/12/2016      Reactions   Crab [shellfish Allergy] Anaphylaxis   Iodine Anaphylaxis   Iohexol     Code: HIVES, Desc: pt states , swelling mouth , throat, and has difficulty breathing   Lactose Intolerance (gi)    Adhesive [tape] Rash   Latex Itching, Rash      Medication List    STOP taking these medications   cephALEXin 500 MG capsule Commonly known as:  KEFLEX     TAKE these medications   acetaminophen 500 MG tablet Commonly known as:  TYLENOL Take 1,000 mg by mouth every 6 (six) hours as needed (pain).   Albuterol Sulfate 108 (90 Base) MCG/ACT Aepb Commonly known as:  PROAIR RESPICLICK Inhale 2 puffs into the lungs every 8 (eight) hours as needed.   ALPRAZolam 1 MG tablet Commonly known as:  XANAX Take 0.5 mg by mouth 2 (two) times daily.   atorvastatin 20 MG tablet Commonly known  as:  LIPITOR Take 20 mg by mouth daily before breakfast.   benzonatate 100 MG capsule Commonly known as:  TESSALON Take 1 capsule (100 mg total) by mouth 3 (three) times daily.   budesonide-formoterol 160-4.5 MCG/ACT inhaler Commonly known as:  SYMBICORT Inhale 2 puffs into the lungs 2 (two) times daily.   calcium-vitamin D 500-200 MG-UNIT tablet Take 1 tablet by mouth 3 (three) times a week. Take 1 tablet by mouth Monday, Wednesday, and Friday   chlorpheniramine-HYDROcodone 10-8 MG/5ML Suer Commonly known as:  TUSSIONEX Take 5 mLs by mouth every 12 (twelve) hours.   exemestane 25 MG tablet Commonly known as:  AROMASIN TAKE 1 TABLET  BY MOUTH AFTER BREAKFAST   fluticasone 50 MCG/ACT nasal spray Commonly known as:  FLONASE Place 2 sprays into both nostrils daily.   guaiFENesin 600 MG 12 hr tablet Commonly known as:  MUCINEX Take 1 tablet (600 mg total) by mouth 2 (two) times daily.   hydrochlorothiazide 25 MG tablet Commonly known as:  HYDRODIURIL Take 25 mg by mouth daily before breakfast.   Iron 240 (27 Fe) MG Tabs Take 1 tablet by mouth every morning.   levofloxacin 500 MG tablet Commonly known as:  LEVAQUIN Take 1 tablet (500 mg total) by mouth daily.   loratadine 10 MG tablet Commonly known as:  CLARITIN Take 1 tablet (10 mg total) by mouth daily.   losartan 50 MG tablet Commonly known as:  COZAAR Take 50 mg by mouth daily.   metFORMIN 500 MG tablet Commonly known as:  GLUCOPHAGE Take 500 mg by mouth daily with breakfast.   metoprolol tartrate 100 MG tablet Commonly known as:  LOPRESSOR Take 100 mg by mouth daily before breakfast.   Na Sulfate-K Sulfate-Mg Sulf 17.5-3.13-1.6 GM/180ML Soln Suprep-Use as directed   omeprazole 20 MG capsule Commonly known as:  PRILOSEC Take 20 mg by mouth every morning.   OXYGEN Inhale into the lungs. Use 2L continuously   PARoxetine 40 MG tablet Commonly known as:  PAXIL Take 40 mg by mouth daily before breakfast.   predniSONE 10 MG tablet Commonly known as:  DELTASONE 4 tabs x 1 day, 3 tabs x 1 day,  2 tab x 1 day, 1 tab x 1 day, 0.5 tab x 1 day then stop.   traMADol 50 MG tablet Commonly known as:  ULTRAM Take 50 mg by mouth 2 (two) times daily.   vitamin C with rose hips 500 MG tablet Take 500 mg by mouth every morning.      Follow-up Information    Seward Carol, MD Follow up in 1 week(s).   Specialty:  Internal Medicine Contact information: [address] [place] [address] [phone]          Allergies  Allergen Reactions  . Crab [Shellfish Allergy] Anaphylaxis  . Iodine Anaphylaxis  . Iohexol      Code:  HIVES, Desc: pt states , swelling mouth , throat, and has difficulty breathing   . Lactose Intolerance (Gi)   . Adhesive [Tape] Rash  . Latex Itching and Rash     Procedures/Studies:    Dg Chest 2 View  Result Date: 12/08/2016 CLINICAL DATA:  Shortness of breath. EXAM: CHEST  2 VIEW COMPARISON:  CT 11/19/2015.  Chest x-ray 12/12/2012. FINDINGS: Mediastinum hilar structures normal. Cardiomegaly with pulmonary arterial prominence suggesting pulmonary hypertension again noted. Postsurgical changes right lung. Mild chronic interstitial prominence again noted. A component of active interstitial process cannot be excluded. No pleural  effusion or pneumothorax. Surgical clips posterior abdomen. IMPRESSION: 1. Postsurgical changes right lung. Mild pulmonary interstitial prominence again noted consistent with chronic interstitial disease. A component of active interstitial pneumonitis or very mild interstitial edema cannot be excluded. 2. Cardiomegaly with prominent central pulmonary arteries again noted suggesting pulmonary hypertension. Electronically Signed   By: Marcello Moores  Register   On: 12/08/2016 07:25       Discharge Exam: Vitals:   12/12/16 0533 12/12/16 0805  BP: (!) 165/104 (!) 154/91  Pulse: 66   Resp: 19   Temp: 97.8 F (36.6 C)    Vitals:   12/12/16 0742 12/12/16 0744 12/12/16 0754 12/12/16 0805  BP:    (!) 154/91  Pulse:      Resp:      Temp:      TempSrc:      SpO2: (!) 84% (!) 84% 98%   Weight:      Height:        General: Pt is alert, awake, not in acute distress Cardiovascular: RRR, S1/S2 +, no rubs, no gallops Respiratory: CTA bilaterally, no wheezing, no rhonchi Abdominal: Soft, NT, ND, bowel sounds + Extremities: no edema, no cyanosis    The results of significant diagnostics from this hospitalization (including imaging, microbiology, ancillary and laboratory) are listed below for reference.     Microbiology: No results found for this or any previous visit  (from the past 240 hour(s)).   Labs: BNP (last 3 results) No results for input(s): BNP in the last 8760 hours. Basic Metabolic Panel:  Recent Labs Lab 12/08/16 0805 12/09/16 0649  NA 136 139  K 3.2* 3.7  CL 94* 96*  CO2 28 30  GLUCOSE 229* 278*  BUN 21* 20  CREATININE 0.97 0.88  CALCIUM 9.3 9.5   Liver Function Tests: No results for input(s): AST, ALT, ALKPHOS, BILITOT, PROT, ALBUMIN in the last 168 hours. No results for input(s): LIPASE, AMYLASE in the last 168 hours. No results for input(s): AMMONIA in the last 168 hours. CBC:  Recent Labs Lab 12/08/16 0805 12/09/16 0649  WBC 7.6 8.2  NEUTROABS 6.8  --   HGB 13.0 12.7  HCT 39.2 39.5  MCV 87.3 87.0  PLT 120* 121*   Cardiac Enzymes: No results for input(s): CKTOTAL, CKMB, CKMBINDEX, TROPONINI in the last 168 hours. BNP: Invalid input(s): POCBNP CBG:  Recent Labs Lab 12/11/16 1145 12/11/16 1539 12/11/16 1644 12/11/16 2157 12/12/16 0720  GLUCAP 436* 201* 197* 271* 197*   D-Dimer No results for input(s): DDIMER in the last 72 hours. Hgb A1c No results for input(s): HGBA1C in the last 72 hours. Lipid Profile No results for input(s): CHOL, HDL, LDLCALC, TRIG, CHOLHDL, LDLDIRECT in the last 72 hours. Thyroid function studies No results for input(s): TSH, T4TOTAL, T3FREE, THYROIDAB in the last 72 hours.  Invalid input(s): FREET3 Anemia work up No results for input(s): VITAMINB12, FOLATE, FERRITIN, TIBC, IRON, RETICCTPCT in the last 72 hours. Urinalysis    Component Value Date/Time   COLORURINE YELLOW 04/24/2007 1050   APPEARANCEUR HAZY (A) 04/24/2007 1050   LABSPEC 1.016 04/24/2007 1050   PHURINE 6.5 04/24/2007 1050   GLUCOSEU NEGATIVE 04/24/2007 1050   HGBUR NEGATIVE 04/24/2007 [place] 04/24/2007 1050   KETONESUR NEGATIVE 04/24/2007 1050   PROTEINUR NEGATIVE 04/24/2007 1050   UROBILINOGEN 1.0 04/24/2007 1050   NITRITE NEGATIVE 04/24/2007 1050   LEUKOCYTESUR SMALL (A)  04/24/2007 1050   Sepsis Labs Invalid input(s): PROCALCITONIN,  WBC,  LACTICIDVEN Microbiology No results found  for this or any previous visit (from the past 240 hour(s)).   Time coordinating discharge: Over 30 minutes  SIGNED:   Debbe Odea, MD  [facility] 12/12/2016, 11:00 AM Pager   If 7PM-7AM, please contact night-coverage [email or web address] Password [email or web address]

## 2016-12-11 NOTE — Progress Notes (Signed)
Patient and sister in law refusing to go home due to persistently elevated glucose. Will d/c discharge and follow sugars.   Debbe Odea, MD

## 2016-12-11 NOTE — Progress Notes (Signed)
Ambulated 100 feet in the hallway with 2L oxygen via [place], pts o2 sat dropped to 88% while ambulating and oxygen was increased to 3l. Pt returned to 94% ambulating. MD aware

## 2016-12-11 NOTE — Discharge Instructions (Signed)
Increase O2 to 4 L when you walk. See your PCP in 1 wk to ensure that you are improving and to see if you O2 can be weaned down.   Please take all your medications with you for your next visit with your Primary MD. Please request your Primary MD to go over all hospital test results at the follow up. Please ask your Primary MD to get all Hospital records sent to his/her office.  If you experience worsening of your admission symptoms, develop shortness of breath, chest pain, suicidal or homicidal thoughts or a life threatening emergency, you must seek medical attention immediately by calling 911 or calling your MD.  Dennis Bast must read the complete instructions/literature along with all the possible adverse reactions/side effects for all the medicines you take including new medications that have been prescribed to you. Take new medicines after you have completely understood and accpet all the possible adverse reactions/side effects.   Do not drive when taking pain medications or sedatives.    Do not take more than prescribed Pain, Sleep and Anxiety Medications  If you have smoked or chewed Tobacco in the last 2 yrs please stop. Stop any regular alcohol and or recreational drug use.  Wear Seat belts while driving.

## 2016-12-12 DIAGNOSIS — E119 Type 2 diabetes mellitus without complications: Secondary | ICD-10-CM

## 2016-12-12 LAB — GLUCOSE, CAPILLARY
GLUCOSE-CAPILLARY: 197 mg/dL — AB (ref 65–99)
GLUCOSE-CAPILLARY: 359 mg/dL — AB (ref 65–99)
GLUCOSE-CAPILLARY: 187 mg/dL — AB (ref 65–99)
Glucose-Capillary: 183 mg/dL — ABNORMAL HIGH (ref 65–99)

## 2016-12-12 MED ORDER — HYDRALAZINE HCL 10 MG PO TABS
10.0000 mg | ORAL_TABLET | Freq: Once | ORAL | Status: AC
  Administered 2016-12-12: 10 mg via ORAL
  Filled 2016-12-12: qty 1

## 2016-12-12 MED ORDER — METFORMIN HCL 500 MG PO TABS: 500.0000 mg | ORAL_TABLET | Freq: Every day | ORAL | Status: DC

## 2016-12-12 MED ORDER — GLIMEPIRIDE 1 MG PO TABS
1.0000 mg | ORAL_TABLET | Freq: Once | ORAL | Status: AC
  Administered 2016-12-12: 1 mg via ORAL
  Filled 2016-12-12: qty 1

## 2016-12-12 NOTE — Progress Notes (Signed)
Pt was informed this AM to not have any juice or high starch foods prior to ordering breakfast. PT CBG at lunch time is 359. MD was made aware. Pt was informed not to order drinks high in sugar content and low starch foods. RN informed pt that she will not be discharged due to high CBG results. RN informed pt that CBG will be rechecked again at dinner time. Pt acknowledged understanding. RN will continue to monitor.  Drue Camera W Mackinzee Roszak, RN

## 2016-12-12 NOTE — Progress Notes (Signed)
Date: Dec 12, 2016  Discharge orders review for case management needs.  None found  Velva Harman, BSN, [place], [place]:  [phone]

## 2016-12-12 NOTE — Progress Notes (Signed)
Md. Hulen Skains back new orders given.

## 2016-12-12 NOTE — Progress Notes (Addendum)
[facility]  Patient evaluated this AM. Stable for discharge. Pulse ox in 90s on exertion on 3 L. Sugars stable. Please see my d/c summary from yesterday which I have updated today.   Debbe Odea, MD

## 2016-12-12 NOTE — Progress Notes (Signed)
Pt oxygen saturation at 98% on 2L at rest. Pt ambulated 300 ft in hallway on 2L. Pt oxygen saturationd dropped to 88%. Pt was placed on 3L [place], oxygen saturation recovered to 93%. MD aware.  Dianely Krehbiel W Juandaniel Manfredo, RN

## 2016-12-12 NOTE — Progress Notes (Signed)
Patient given discharge instructions, and verbalized an understanding of all discharge instructions.  Patient agrees with discharge plan, and is being discharged in stable medical condition.  Patient given transportation via wheelchair.  Patient's family to pick patient up, with home oxygen supply.

## 2016-12-12 NOTE — Progress Notes (Signed)
Page: Amy [name] [phone] [place]'s systolic Bp is 878/67 she has an order to give hydralizine IV for SBP >160 but she has no access. ? DC tomorrow

## 2016-12-27 ENCOUNTER — Ambulatory Visit (INDEPENDENT_AMBULATORY_CARE_PROVIDER_SITE_OTHER)
Admission: RE | Admit: 2016-12-27 | Discharge: 2016-12-27 | Disposition: A | Discharge status: Home / Self Care | Payer: Medicare Other | Source: Ambulatory Visit | Attending: Internal Medicine | Admitting: Internal Medicine

## 2016-12-27 DIAGNOSIS — J849 Interstitial pulmonary disease, unspecified: Secondary | ICD-10-CM | POA: Diagnosis not present

## 2016-12-28 ENCOUNTER — Encounter: Payer: Self-pay | Admitting: Internal Medicine

## 2016-12-28 ENCOUNTER — Ambulatory Visit (INDEPENDENT_AMBULATORY_CARE_PROVIDER_SITE_OTHER): Payer: Medicare Other | Admitting: Internal Medicine

## 2016-12-28 VITALS — BP 114/82 | HR 85 | Ht 65.0 in | Wt 255.6 lb

## 2016-12-28 DIAGNOSIS — J441 Chronic obstructive pulmonary disease with (acute) exacerbation: Secondary | ICD-10-CM

## 2016-12-28 DIAGNOSIS — J438 Other emphysema: Secondary | ICD-10-CM

## 2016-12-28 DIAGNOSIS — I272 Pulmonary hypertension, unspecified: Secondary | ICD-10-CM | POA: Diagnosis not present

## 2016-12-28 MED ORDER — PREDNISONE 10 MG PO TABS: ORAL_TABLET | ORAL | 0 refills | Status: DC

## 2016-12-28 MED ORDER — TIOTROPIUM BROMIDE MONOHYDRATE 1.25 MCG/ACT IN AERS: 2.0000 | INHALATION_SPRAY | Freq: Every day | RESPIRATORY_TRACT | 0 refills | Status: DC

## 2016-12-28 NOTE — Progress Notes (Signed)
Subjective:     Patient ID: Amy [name], female   DOB: 1945-08-06, 71 y.o.   MRN: [id number]  HPI  71 year old woman history of 26 pack smoking history, COPD, chronic hypxoic respiratory failure, ILD, pulmonary hypertension, obesity, hx of lobectomy presenting for follow up of ILD and COPD  She was hospitalized 5/17 to 5/21 for COPD exacerbation. She was treated with Levaquin and prednisone. Her O2 was increased to 3L with exertion. Still on 2L otherwise.  Having cough. Improved since hospitalization. She has mucous but has trouble coughing it up. It is clear when she does cough it up. She denies baseline cough. Denies dyspnea at rest. She sometimes has dyspnea on exertion. She has had chills but no fevers. She denies post nasal drip on the last few days.  She takes Symbicort every day. She takes albuterol 3-4 times in the last 2 weeks. Did not tolerate Incruse and   CT chest high res 6/5/208: Resolved multifocal ground glass attenuation, residual areas of septal thickening and very mild peripheral bronchilectasis, mild diffuse bronchial wall thickening and mild centrilobular emphysema, multiple tiny pulmonary nodules that are stable  Review of Systems Constitutional: no fevers Eyes: +blurry vision Ears, nose, mouth, throat, and face: +cough Respiratory: no shortness of breath Cardiovascular: no chest pain Gastrointestinal: no nausea/vomiting, no abdominal pain, no constipation, no diarrhea Genitourinary: no dysuria, no hematuria Integument: no rash Hematologic/lymphatic: +easy bruising, no edema Musculoskeletal: +chronic arthralgias, no myalgias Neurological: no paresthesias, no weakness     Objective:   Physical Exam BP 114/82 (BP Location: Left Arm, Patient Position: Sitting, Cuff Size: Normal)   Pulse 85   Ht 5\' 5"  (1.651 m)   Wt 255 lb 9.6 oz (115.9 kg)   LMP 07/26/1983   SpO2 93%   BMI 42.53 kg/m   General Apperance: NAD HEENT: Normocephalic, atraumatic, anicteric  sclera Neck: Supple, trachea midline Lungs: Clear to auscultation bilaterally. No wheezes, rhonchi or rales. Breathing comfortably Heart: Regular rate and rhythm, no murmur/rub/gallop Abdomen: Soft, nontender, nondistended, no rebound/guarding Extremities: Warm and well perfused, no edema Skin: No rashes or lesions Neurologic: Alert and interactive. No gross deficits.    Assessment:     Chronic respiratory failure with hypoxia Emphysema Pulmonary Hypertension Obesity History of lobectomy of lung    Plan:     Slow to improve from previous COPD exacerbation. Previous areas on CT chest concerning for pulmonary fibrosis has improved  -Continue Symbicort -Start Spiriva -5 days of prednisone, 40mg  daily then taper with 30mg , 20mg , 10mg , 5mg  -Continue supplemental oxygen continuous   Jacques Earthly, MD  Internal Medicine PGY-3 12/28/16 1:22 PM      STAFF NOTE: I, Dr Ann Lions have personally reviewed patient's available data, including medical history, events of note, physical examination and test results as part of my evaluation. I have discussed with resident/NP and other care providers such as pharmacist, RN and RRT.  In addition,  I personally evaluated patient and elicited key findings of   S: better after recent exacerbation . Still some symptomatich though but unable to figure out if back to baseline but probably not. Wants more inhaler Rx but has intolerance to incruse  O: obese Looks same No wheeze  CT - emphysema and cor pulmonale . ILD findings improved  A: COPD - likely stable but cannot rule out AECOPD residue  P: continue symbicort Add spiriva - will have to watch for intolerance conitnue o2 Will give pred burst to help resolve the aecopd residue   .  Rest per NP/medical resident whose note is outlined above and that I agree with   Dr. Brand Males, M.D., [facility].C.P Pulmonary and Critical Care Medicine Staff Physician [place]  Pulmonary and Critical Care Pager: [phone], If no answer or between  15:00h - 7:00h: call [phone]  01/02/2017 11:42 AM

## 2016-12-28 NOTE — Progress Notes (Signed)
Pt was shown how to properly use her inhaler. She used the demo herself to demonstrate. She had no further questions

## 2016-12-28 NOTE — Patient Instructions (Signed)
Other emphysema ([place])  Pulmonary hypertension ([place])  COPD exacerbation (HCC)  You are slow to improve from previous COPD exacerbation. Previous areas on CT chest concerning for pulmonary fibrosis has improved  -Continue Symbicort -Start Spiriva -Take 5 days of prednisone 40mg  daily then taper with 30mg  x 1 day, 20mg  x 1 day, 10mg  x 1 day, 5mg  x 1 day -Continue supplemental oxygen continuous   Follow up in 4-6 weeks with APP

## 2017-01-04 ENCOUNTER — Ambulatory Visit: Payer: Medicare Other | Admitting: Obstetrics and Gynecology

## 2017-01-16 ENCOUNTER — Ambulatory Visit: Payer: Medicare Other | Admitting: Obstetrics and Gynecology

## 2017-01-16 ENCOUNTER — Telehealth: Payer: Self-pay | Admitting: Internal Medicine

## 2017-01-16 MED ORDER — TIOTROPIUM BROMIDE MONOHYDRATE 1.25 MCG/ACT IN AERS: 2.0000 | INHALATION_SPRAY | Freq: Every day | RESPIRATORY_TRACT | 5 refills | Status: DC

## 2017-01-16 NOTE — Telephone Encounter (Signed)
Thanks and agree with your advice  Dr. Brand Males, M.D., [facility].C.P Pulmonary and Critical Care Medicine Staff Physician [facility] Pager: [phone], If no answer or between  15:00h - 7:00h: call [phone]  01/16/2017 3:06 PM

## 2017-01-16 NOTE — Telephone Encounter (Signed)
Pt seen 6.6.18 by MR: Patient Instructions  Other emphysema ([place])   Pulmonary hypertension ([place])   COPD exacerbation ([place])   You are slow to improve from previous COPD exacerbation. Previous areas on CT chest concerning for pulmonary fibrosis has improved   -Continue Symbicort -Start Spiriva -Take 5 days of prednisone 40mg  daily then taper with 30mg  x 1 day, 20mg  x 1 day, 10mg  x 1 day, 5mg  x 1 day -Continue supplemental oxygen continuous    Follow up in 4-6 weeks with APP   Called spoke with patient who reported that she has had some improvement in her breathing since adding the Spiriva to her Symbicort.  She does note some trembling, but "not bad" (Incruse is listed on her allergy list with the reaction noted as "trembling, blurred vision").  Pt would like to continue taking this medication and requested a Rx be sent to her pharmacy.  Pt is nearly out of her sample.  Pt having no chest pain, shortness of breath, blurred vision on the Spiriva.  Pt aware will send message to MR to let him know breathing is improved on the Spiriva but experiencing some trembling.  Rx sent to verified pharmacy.  Patient aware to stop the Spiriva and contact the office immediately if she experiences worsening trembling, any blurred vision, shortness of breath, chest pain.  Routing to MR

## 2017-01-19 ENCOUNTER — Encounter: Payer: Self-pay | Admitting: Obstetrics and Gynecology

## 2017-01-19 ENCOUNTER — Ambulatory Visit (INDEPENDENT_AMBULATORY_CARE_PROVIDER_SITE_OTHER): Payer: Medicare Other | Admitting: Obstetrics and Gynecology

## 2017-01-19 VITALS — BP 112/72 | HR 80 | Resp 16 | Ht 66.0 in | Wt 257.0 lb

## 2017-01-19 DIAGNOSIS — Z01419 Encounter for gynecological examination (general) (routine) without abnormal findings: Secondary | ICD-10-CM | POA: Diagnosis not present

## 2017-01-19 NOTE — Progress Notes (Signed)
71 y.o. G22P1010 Widowed [place] American female here for annual exam.    Patient with a hx of colon, renal, right breast and lung cancer.   Pelvic US to check ovaries in 2016 - normal.   Hospitalized to COPD this spring.  Breathing better now in her inhalers.  Uses O2 24 hours a day.   No problems with bladder function.  Occasional diarrhea depending on her food choices.   No abdominal or pelvic pain.   Lives by herself and enjoys her freedom.  Brother lives close to her.   PCP:  Seward Carol, MD  Patient's last menstrual period was 07/26/1983.           Sexually active: No.  The current method of family planning is status post hysterectomy -- ovaries remain per patient.    Exercising: No.  The patient does not participate in regular exercise at present. Smoker:  no  Health Maintenance: Pap:  06/14/12 wnl History of abnormal Pap:  no MMG:  01/06/16 BIRADS 2 benign/density c -- pt will schedule Colonoscopy:  10/08/14-Polyps. Repeat in 2 yrs -- pt will schedule  BMD:   04/01/16  Result Normal TDaP:  Pt unsure Prenvar: 08/26/15 Screening Labs: PCP takes care of labs   reports that she quit smoking about 3 years ago. Her smoking use included Cigarettes. She started smoking about 24 years ago. She has a 26.25 pack-year smoking history. She has never used smokeless tobacco. She reports that she does not drink alcohol or use drugs.  Past Medical History:  Diagnosis Date  . Allergy   . Anemia   . Anxiety   . Arthritis   . Blood dyscrasia    Sickle Cell traits  . Blood transfusion without reported diagnosis 1999   prbc S/P COLON SURGERY  . Breast cancer, right breast ([place]) 10/03/12  . Cancer ([place])    colon,renal,lung  . Cataract    NO SURGERY  . Chronic kidney disease 1999   RIGHT KIDNEY REMOVED  . Colon cancer ([place]) 1999, 2013  . Depression   . Diabetes mellitus    diet controlled  . GERD (gastroesophageal reflux disease)   . H/O: lung cancer   . Heart murmur   .  Hernia    Near umbilicus  . History of colon cancer   . History of kidney cancer   . Hot flashes    5 yr pill for breast CA causing hot flashes  . HX: breast cancer 2014   --right breast  . Hypertension    Does not see a cardiologist  . Internal hemorrhoids   . Interstitial lung disease ([place]) 11/25/2015  . Lung cancer ([place]) 2001  . Neuromuscular disorder ([place])    PERIPHERAL NEUROPATHY FEET  . Shortness of breath    with exertion   . Sickle cell trait ([place])   . Tubular adenoma of colon   . Ulcer    HX YEARS AGO    Past Surgical History:  Procedure Laterality Date  . ABDOMINAL HYSTERECTOMY  80's   fibroids  . BREAST BIOPSY     Bilateral cysts/benign  . BREAST BIOPSY Right 10/03/12   ER/PR +  . BREAST BIOPSY Right 12/20/2012   Procedure: Right breast Excisional Biopsy;  Surgeon: Rolm Bookbinder, MD;  Location: [facility];  Service: General;  Laterality: Right;  . BREAST LUMPECTOMY WITH NEEDLE LOCALIZATION Right 12/20/2012   Procedure: BREAST LUMPECTOMY WITH NEEDLE LOCALIZATION;  Surgeon: Rolm Bookbinder, MD;  Location: [facility];  Service: General;  Laterality: Right;  . BREAST LUMPECTOMY WITH RADIOACTIVE SEED LOCALIZATION Right 01/19/2015   Procedure: RIGHT BREAST LUMPECTOMY WITH RADIOACTIVE SEED LOCALIZATION;  Surgeon: Rolm Bookbinder, MD;  Location: [facility];  Service: General;  Laterality: Right;  . CARDIAC CATHETERIZATION N/A 03/02/2016   Procedure: Right Heart Cath;  Surgeon: Jolaine Artist, MD;  Location: [facility];  Service: Cardiovascular;  Laterality: N/A;  . COLECTOMY  05-31-2012   laparoscopic ,possible open  . COLON SURGERY     chemo  . COLONOSCOPY    . [place]   right/chemo kidney removed for cancer  . LUNG LOBECTOMY     right lower  . PARTIAL COLECTOMY  05/31/2012   Procedure: PARTIAL COLECTOMY;  Surgeon: Rolm Bookbinder, MD;  Location: [facility];  Service: General;  Laterality: N/A;  . PARTIAL HYSTERECTOMY    . POLYPECTOMY     . RE-EXCISION OF BREAST CANCER,SUPERIOR MARGINS Right 01/21/2013   Procedure: RE-EXCISION OF BREAST CANCER,SUPERIOR MARGINS;  Surgeon: Rolm Bookbinder, MD;  Location: [facility];  Service: General;  Laterality: Right;  . TONSILLECTOMY     as child  . TUBAL LIGATION      Current Outpatient Prescriptions  Medication Sig Dispense Refill  . acetaminophen (TYLENOL) 500 MG tablet Take 1,000 mg by mouth every 6 (six) hours as needed (pain).    . Albuterol Sulfate (PROAIR RESPICLICK) 109 (90 Base) MCG/ACT AEPB Inhale 2 puffs into the lungs every 8 (eight) hours as needed. 1 each 11  . ALPRAZolam (XANAX) 1 MG tablet Take 0.5 mg by mouth 2 (two) times daily.     . Ascorbic Acid (VITAMIN C WITH ROSE HIPS) 500 MG tablet Take 500 mg by mouth every morning.     Marland Kitchen atorvastatin (LIPITOR) 20 MG tablet Take 20 mg by mouth daily before breakfast.     . benzonatate (TESSALON) 100 MG capsule Take 1 capsule (100 mg total) by mouth 3 (three) times daily. 21 capsule 0  . budesonide-formoterol (SYMBICORT) 160-4.5 MCG/ACT inhaler Inhale 2 puffs into the lungs 2 (two) times daily. 1 Inhaler 11  . Calcium Carbonate-Vitamin D (CALCIUM-VITAMIN D) 500-200 MG-UNIT per tablet Take 1 tablet by mouth 3 (three) times a week. Take 1 tablet by mouth Monday, Wednesday, and Friday    . exemestane (AROMASIN) 25 MG tablet TAKE 1 TABLET BY MOUTH AFTER BREAKFAST 90 tablet 3  . Ferrous Gluconate (IRON) 240 (27 FE) MG TABS Take 1 tablet by mouth every morning.     . fluticasone (FLONASE) 50 MCG/ACT nasal spray Place 2 sprays into both nostrils daily. 16 g 2  . guaiFENesin (MUCINEX) 600 MG 12 hr tablet Take 1 tablet (600 mg total) by mouth 2 (two) times daily. 60 tablet 0  . hydrochlorothiazide 25 MG tablet Take 25 mg by mouth daily before breakfast.     . loratadine (CLARITIN) 10 MG tablet Take 1 tablet (10 mg total) by mouth daily. 30 tablet 0  . losartan (COZAAR) 50 MG tablet Take 50 mg by mouth daily.    . metFORMIN (GLUCOPHAGE) 500  MG tablet Take 500 mg by mouth daily with breakfast.     . metoprolol (LOPRESSOR) 100 MG tablet Take 100 mg by mouth daily before breakfast.     . Na Sulfate-K Sulfate-Mg Sulf 17.5-3.13-1.6 GM/180ML SOLN Suprep-Use as directed 354 mL 0  . omeprazole (PRILOSEC) 20 MG capsule Take 20 mg by mouth every morning.     . OXYGEN Inhale into the lungs. Use 2L  continuously    . PARoxetine (PAXIL) 40 MG tablet Take 40 mg by mouth daily before breakfast.     . traMADol (ULTRAM) 50 MG tablet Take 50 mg by mouth 2 (two) times daily.      No current facility-administered medications for this visit.     Family History  Problem Relation Age of Onset  . Colon polyps Brother   . Multiple myeloma Brother 67  . Breast cancer Mother        dx in her 41s  . Heart disease Father   . Colon cancer Paternal Aunt   . Breast cancer Maternal Grandmother   . Colon cancer Cousin        2 paternal first cousins  . Leukemia Cousin 11  . Esophageal cancer Neg Hx   . Rectal cancer Neg Hx   . Stomach cancer Neg Hx     ROS:  Pertinent items are noted in HPI.  Otherwise, a comprehensive ROS was negative.  Exam:   BP 112/72 (BP Location: Right Arm, Patient Position: Sitting, Cuff Size: Large)   Pulse 80   Resp 16   Ht 5' 6"  (1.676 m)   Wt 257 lb (116.6 kg)   LMP 07/26/1983   BMI 41.48 kg/m     General appearance: alert, cooperative and appears stated age Head: Normocephalic, without obvious abnormality, atraumatic Neck: no adenopathy, supple, symmetrical, trachea midline and thyroid normal to inspection and palpation Lungs: clear to auscultation bilaterally Breasts: normal appearance, no masses or tenderness, No nipple retraction or dimpling, No nipple discharge or bleeding, No axillary or supraclavicular adenopathy Heart: regular rate and rhythm Abdomen: vertical midline incision with hernia.  Abdomen is soft, non-tender; no masses, no organomegaly Extremities: extremities normal, atraumatic, no cyanosis or  edema Skin: Skin color, texture, turgor normal. No rashes or lesions Lymph nodes: Cervical, supraclavicular, and axillary nodes normal. No abnormal inguinal nodes palpated Neurologic: Grossly normal  Pelvic: External genitalia:  no lesions              Urethra:  normal appearing urethra with no masses, tenderness or lesions              Bartholins and Skenes: normal                 Vagina: normal appearing vagina with normal color and discharge, no lesions.  Atrophy noted.               Cervix: absent.               Pap taken: No. Bimanual Exam:  Uterus:   Absent.               Adnexa: no mass, fullness, tenderness              Rectal exam: Yes.  .  Confirms.              Anus:  normal sphincter tone, no lesions  Chaperone was present for exam.  Assessment:   Well woman visit with normal exam. Status post TAH for fibroids.  Hx multiple malignancies.  Right breast, colon , right kidney, and lung.  Vaginal atrophy.  COPD.  On O2.   Plan: Mammogram screening discussed.  She states she will call to schedule.  Recommended self breast awareness. Pap and HR HPV as above. Guidelines for Calcium, Vitamin D, regular exercise program including cardiovascular and weight bearing exercise. She will schedule her colonscopy.  Follow up annually and prn.  After visit summary provided.

## 2017-01-26 ENCOUNTER — Encounter: Payer: Self-pay | Admitting: Adult Health

## 2017-01-26 ENCOUNTER — Ambulatory Visit (INDEPENDENT_AMBULATORY_CARE_PROVIDER_SITE_OTHER): Payer: Medicare Other | Admitting: Adult Health

## 2017-01-26 DIAGNOSIS — J441 Chronic obstructive pulmonary disease with (acute) exacerbation: Secondary | ICD-10-CM | POA: Diagnosis not present

## 2017-01-26 DIAGNOSIS — J9611 Chronic respiratory failure with hypoxia: Secondary | ICD-10-CM | POA: Diagnosis not present

## 2017-01-26 NOTE — Assessment & Plan Note (Signed)
Cont on O2 .  

## 2017-01-26 NOTE — Patient Instructions (Addendum)
Continue on Symbicort 2 puffs Twice daily  , rinse after use  Continue on Spiriva daily , rinse after use.  Continue on oxygen 2l/m .  Follow up with Dr. Chase Caller in 3-4 months and. As needed

## 2017-01-26 NOTE — Assessment & Plan Note (Signed)
Recent flare now resolved  Improved  Control on Spiriva   Plan  Patient Instructions  Continue on Symbicort 2 puffs Twice daily  , rinse after use  Continue on Spiriva daily , rinse after use.  Continue on oxygen 2l/m .

## 2017-01-26 NOTE — Progress Notes (Signed)
@Patient  ID: Marcene Corning, female    DOB: 05-16-1946, 71 y.o.   MRN: [id number]  Chief Complaint  Patient presents with  . Follow-up    Emphysema    Referring provider: Seward Carol, MD  HPI: 71 year old female former smoker followed for COPD, chronic hypoxic respiratory failure, interstitial lung disease, pulmonary hypertension Previous lung cancer s/p lobectomy   TEST  CT chest high res 6/5/208: Resolved multifocal ground glass attenuation, residual areas of septal thickening and very mild peripheral bronchilectasis, mild diffuse bronchial wall thickening and mild centrilobular emphysema, multiple tiny pulmonary nodules that are stable  01/26/2017 Follow up : COPD  Pt returns for 1 month follow up . She had a slow to resolve COPD flare . She was given a steroid taper and started on Spiriva . Remains on Symbicort.  She feels it has helped her. Feels she is back to baseline .  At baseline she gets dyspnea with long walking , heavy lifting . Does grocery shopping but has to rest frequently and use buggy to rest on .   Remains on O2 at 2l/m . Feels it helps her.   Allergies  Allergen Reactions  . Crab [Shellfish Allergy] Anaphylaxis  . Iodine Anaphylaxis  . Incruse Ellipta [Umeclidinium Bromide] Other (See Comments)    Trembling, blurred vision  . Iohexol      Code: HIVES, Desc: pt states , swelling mouth , throat, and has difficulty breathing   . Lactose Intolerance (Gi)   . Adhesive [Tape] Rash  . Latex Itching and Rash    Immunization History  Administered Date(s) Administered  . Influenza, High Dose Seasonal PF 04/18/2016  . Influenza,inj,Quad PF,36+ Mos 08/26/2015  . Pneumococcal Conjugate-13 08/26/2015    Past Medical History:  Diagnosis Date  . Allergy   . Anemia   . Anxiety   . Arthritis   . Blood dyscrasia    Sickle Cell traits  . Blood transfusion without reported diagnosis 1999   prbc S/P COLON SURGERY  . Breast cancer, right breast ([place]) 10/03/12    . Cancer ([place])    colon,renal,lung  . Cataract    NO SURGERY  . Chronic kidney disease 1999   RIGHT KIDNEY REMOVED  . Colon cancer ([place]) 1999, 2013  . Depression   . Diabetes mellitus    diet controlled  . GERD (gastroesophageal reflux disease)   . H/O: lung cancer   . Heart murmur   . Hernia    Near umbilicus  . History of colon cancer   . History of kidney cancer   . Hot flashes    5 yr pill for breast CA causing hot flashes  . HX: breast cancer 2014   --right breast  . Hypertension    Does not see a cardiologist  . Internal hemorrhoids   . Interstitial lung disease ([place]) 11/25/2015  . Lung cancer ([place]) 2001  . Neuromuscular disorder ([place])    PERIPHERAL NEUROPATHY FEET  . Shortness of breath    with exertion   . Sickle cell trait ([place])   . Tubular adenoma of colon   . Ulcer    HX YEARS AGO    Tobacco History: History  Smoking Status  . Former Smoker  . Packs/day: 1.25  . Years: 21.00  . Types: Cigarettes  . Start date: 10/30/1992  . Quit date: 11/20/2013  Smokeless Tobacco  . Never Used    Comment: quit then started up again 2years ago--smokes 1pack every 2-3 days   Counseling  given: Not Answered   Outpatient Encounter Prescriptions as of 01/26/2017  Medication Sig  . acetaminophen (TYLENOL) 500 MG tablet Take 1,000 mg by mouth every 6 (six) hours as needed (pain).  . Albuterol Sulfate (PROAIR RESPICLICK) 962 (90 Base) MCG/ACT AEPB Inhale 2 puffs into the lungs every 8 (eight) hours as needed.  . ALPRAZolam (XANAX) 1 MG tablet Take 0.5 mg by mouth 2 (two) times daily.   . Ascorbic Acid (VITAMIN C WITH ROSE HIPS) 500 MG tablet Take 500 mg by mouth every morning.   Marland Kitchen atorvastatin (LIPITOR) 20 MG tablet Take 20 mg by mouth daily before breakfast.   . benzonatate (TESSALON) 100 MG capsule Take 1 capsule (100 mg total) by mouth 3 (three) times daily.  . budesonide-formoterol (SYMBICORT) 160-4.5 MCG/ACT inhaler Inhale 2 puffs into the lungs 2 (two) times daily.  .  Calcium Carbonate-Vitamin D (CALCIUM-VITAMIN D) 500-200 MG-UNIT per tablet Take 1 tablet by mouth 3 (three) times a week. Take 1 tablet by mouth Monday, Wednesday, and Friday  . exemestane (AROMASIN) 25 MG tablet TAKE 1 TABLET BY MOUTH AFTER BREAKFAST  . Ferrous Gluconate (IRON) 240 (27 FE) MG TABS Take 1 tablet by mouth every morning.   . fluticasone (FLONASE) 50 MCG/ACT nasal spray Place 2 sprays into both nostrils daily.  Marland Kitchen guaiFENesin (MUCINEX) 600 MG 12 hr tablet Take 1 tablet (600 mg total) by mouth 2 (two) times daily.  . hydrochlorothiazide 25 MG tablet Take 25 mg by mouth daily before breakfast.   . loratadine (CLARITIN) 10 MG tablet Take 1 tablet (10 mg total) by mouth daily.  Marland Kitchen losartan (COZAAR) 50 MG tablet Take 50 mg by mouth daily.  . metFORMIN (GLUCOPHAGE) 500 MG tablet Take 500 mg by mouth daily with breakfast.   . metoprolol (LOPRESSOR) 100 MG tablet Take 100 mg by mouth daily before breakfast.   . Na Sulfate-K Sulfate-Mg Sulf 17.5-3.13-1.6 GM/180ML SOLN Suprep-Use as directed  . omeprazole (PRILOSEC) 20 MG capsule Take 20 mg by mouth every morning.   . OXYGEN Inhale into the lungs. Use 2L continuously  . PARoxetine (PAXIL) 40 MG tablet Take 40 mg by mouth daily before breakfast.   . traMADol (ULTRAM) 50 MG tablet Take 50 mg by mouth 2 (two) times daily.    No facility-administered encounter medications on file as of 01/26/2017.      Review of Systems  Constitutional:   No  weight loss, night sweats,  Fevers, chills,  +fatigue, or  lassitude.  HEENT:   No headaches,  Difficulty swallowing,  Tooth/dental problems, or  Sore throat,                No sneezing, itching, ear ache, nasal congestion, post nasal drip,   CV:  No chest pain,  Orthopnea, PND, , anasarca, dizziness, palpitations, syncope.   GI  No heartburn, indigestion, abdominal pain, nausea, vomiting, diarrhea, change in bowel habits, loss of appetite, bloody stools.   Resp:    No chest wall deformity  Skin:  no rash or lesions.  GU: no dysuria, change in color of urine, no urgency or frequency.  No flank pain, no hematuria   MS:  No joint pain or swelling.  No decreased range of motion.  No back pain.    Physical Exam  BP 120/74 (BP Location: Left Arm, Cuff Size: Normal)   Pulse 72   Ht 5\' 6"  (1.676 m)   Wt 254 lb 12.8 oz (115.6 kg)   LMP 07/26/1983  SpO2 97%   BMI 41.13 kg/m   GEN: A/Ox3; pleasant , NAD,  Obese    HEENT:  [place]/AT,  EACs-clear, TMs-wnl, NOSE-clear, THROAT-clear, no lesions, no postnasal drip or exudate noted.   NECK:  Supple w/ fair ROM; no JVD; normal carotid impulses w/o bruits; no thyromegaly or nodules palpated; no lymphadenopathy.    RESP  Decreased BS in bases , . no accessory muscle use, no dullness to percussion  CARD:  RRR, no m/r/g, no peripheral edema, pulses intact, no cyanosis or clubbing.  GI:   Soft & nt; nml bowel sounds; no organomegaly or masses detected.   Musco: Warm bil, no deformities or joint swelling noted.   Neuro: alert, no focal deficits noted.    Skin: Warm, no lesions or rashes    Lab Results:  CBC   BMET  BNP No results found for: BNP  ProBNP No results found for: PROBNP  Imaging: No results found.   Assessment & Plan:   COPD exacerbation ([place]) Recent flare now resolved  Improved  Control on Spiriva   Plan  Patient Instructions  Continue on Symbicort 2 puffs Twice daily  , rinse after use  Continue on Spiriva daily , rinse after use.  Continue on oxygen 2l/m .      Chronic respiratory failure with hypoxia (HCC) Cont on O2 .      Rexene Edison, NP 01/26/2017

## 2017-01-27 ENCOUNTER — Other Ambulatory Visit: Payer: Self-pay | Admitting: Obstetrics and Gynecology

## 2017-01-27 DIAGNOSIS — Z9889 Other specified postprocedural states: Secondary | ICD-10-CM

## 2017-02-21 ENCOUNTER — Ambulatory Visit
Admission: RE | Admit: 2017-02-21 | Discharge: 2017-02-21 | Disposition: A | Discharge status: Home / Self Care | Payer: Medicare Other | Source: Ambulatory Visit | Attending: Obstetrics and Gynecology | Admitting: Obstetrics and Gynecology

## 2017-02-21 DIAGNOSIS — Z9889 Other specified postprocedural states: Secondary | ICD-10-CM

## 2017-02-22 ENCOUNTER — Telehealth: Payer: Self-pay | Admitting: Internal Medicine

## 2017-02-22 NOTE — Telephone Encounter (Signed)
Called and spoke with pt. Pt is requesting a sample of Symbicort 160, pt states this month is a little difficult with appts and her medication. One sample left up front for pick up. Pt verbalized understanding and denied any further questions or concerns at this time.

## 2017-02-27 MED FILL — EXEMESTANE 25 MG TABLET: 25 | 90 days supply | Qty: 90 | Fill #2

## 2017-02-28 NOTE — Assessment & Plan Note (Signed)
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 She underwent re-excision on 01/21/13. Pathology revealed no residual carcinoma or atypia. Did not require radiation currently on hormonal therapy with Aromasin 25 mg daily from 04/02/2013  Aromasin toxicities: No major side effects from Aromasin other than occasional hot flashes and joint pains Breast cancer surveillance: 1. Breast exam done 03/01/2017 is normal 2. Mammograms 02/21/17: Benign, density cat B lumpectomy 01/19/2015: Benign breast tissue  Shortness of breath to minimal exertion:Due to COPD; Hospitalized May 2018  Continue with current treatment with Aromasin and continue with the surveillance plan. Return to clinic in 1 year for follow-up

## 2017-03-01 ENCOUNTER — Ambulatory Visit (HOSPITAL_BASED_OUTPATIENT_CLINIC_OR_DEPARTMENT_OTHER): Payer: Medicare Other | Admitting: Hematology and Oncology

## 2017-03-01 ENCOUNTER — Other Ambulatory Visit (HOSPITAL_BASED_OUTPATIENT_CLINIC_OR_DEPARTMENT_OTHER): Payer: Medicare Other

## 2017-03-01 VITALS — BP 147/87 | HR 83 | Temp 98.0°F | Resp 17 | Ht 66.0 in | Wt 254.6 lb

## 2017-03-01 DIAGNOSIS — Z85038 Personal history of other malignant neoplasm of large intestine: Secondary | ICD-10-CM

## 2017-03-01 DIAGNOSIS — C3491 Malignant neoplasm of unspecified part of right bronchus or lung: Secondary | ICD-10-CM

## 2017-03-01 DIAGNOSIS — Z79811 Long term (current) use of aromatase inhibitors: Secondary | ICD-10-CM

## 2017-03-01 DIAGNOSIS — Z170 Estrogen receptor positive status [ER+]: Secondary | ICD-10-CM

## 2017-03-01 DIAGNOSIS — Z85118 Personal history of other malignant neoplasm of bronchus and lung: Secondary | ICD-10-CM | POA: Diagnosis not present

## 2017-03-01 DIAGNOSIS — C50411 Malignant neoplasm of upper-outer quadrant of right female breast: Secondary | ICD-10-CM

## 2017-03-01 DIAGNOSIS — C649 Malignant neoplasm of unspecified kidney, except renal pelvis: Secondary | ICD-10-CM

## 2017-03-01 DIAGNOSIS — C186 Malignant neoplasm of descending colon: Secondary | ICD-10-CM

## 2017-03-01 LAB — COMPREHENSIVE METABOLIC PANEL
ALBUMIN: 3.8 g/dL (ref 3.5–5.0)
ALK PHOS: 83 U/L (ref 40–150)
ALT: 9 U/L (ref 0–55)
AST: 12 U/L (ref 5–34)
Anion Gap: 11 mEq/L (ref 3–11)
BUN: 21.3 mg/dL (ref 7.0–26.0)
CHLORIDE: 95 meq/L — AB (ref 98–109)
CO2: 35 meq/L — AB (ref 22–29)
Calcium: 10.5 mg/dL — ABNORMAL HIGH (ref 8.4–10.4)
Creatinine: 1.1 mg/dL (ref 0.6–1.1)
EGFR: 62 mL/min/{1.73_m2} — AB (ref >90)
GLUCOSE: 157 mg/dL — AB (ref 70–140)
POTASSIUM: 3.6 meq/L (ref 3.5–5.1)
SODIUM: 141 meq/L (ref 136–145)
Total Bilirubin: 0.83 mg/dL (ref 0.20–1.20)
Total Protein: 7 g/dL (ref 6.4–8.3)

## 2017-03-01 LAB — CBC WITH DIFFERENTIAL/PLATELET
BASO%: 0.7 % (ref 0.0–2.0)
BASOS ABS: 0 10*3/uL (ref 0.0–0.1)
EOS ABS: 0.1 10*3/uL (ref 0.0–0.5)
EOS%: 1.4 % (ref 0.0–7.0)
HCT: 38.7 % (ref 34.8–46.6)
HEMOGLOBIN: 12.8 g/dL (ref 11.6–15.9)
LYMPH%: 25.5 % (ref 14.0–49.7)
MCH: 29 pg (ref 25.1–34.0)
MCHC: 33 g/dL (ref 31.5–36.0)
MCV: 87.8 fL (ref 79.5–101.0)
MONO#: 0.3 10*3/uL (ref 0.1–0.9)
MONO%: 5.3 % (ref 0.0–14.0)
NEUT#: 4 10*3/uL (ref 1.5–6.5)
NEUT%: 67.1 % (ref 38.4–76.8)
Platelets: 143 10*3/uL — ABNORMAL LOW (ref 145–400)
RBC: 4.41 10*6/uL (ref 3.70–5.45)
RDW: 14.7 % — AB (ref 11.2–14.5)
WBC: 5.9 10*3/uL (ref 3.9–10.3)
lymph#: 1.5 10*3/uL (ref 0.9–3.3)

## 2017-03-01 LAB — CEA (IN HOUSE-CHCC): CEA (CHCC-In House): 3.02 ng/mL (ref 0.00–5.00)

## 2017-03-01 MED ORDER — EXEMESTANE 25 MG PO TABS: ORAL_TABLET | ORAL | 3 refills | Status: DC

## 2017-03-01 NOTE — Progress Notes (Signed)
Patient Care Team: Seward Carol, MD as PCP - General (Internal Medicine)  DIAGNOSIS:  Encounter Diagnoses  Name Primary?  . Non-small cell cancer of right lung ([place]) Yes  . Renal cell carcinoma, unspecified laterality ([place])   . Malignant neoplasm of upper-outer quadrant of right female breast, unspecified estrogen receptor status ([place])     SUMMARY OF ONCOLOGIC HISTORY:   Non-small cell cancer of right lung ([place])   06/02/2000 Surgery    T1 N0 non-small cell lung cancer removed from the right lung with a VATS procedure by Dr. 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 She is tolerating Aromasin extremely well. She does not have any major hot flashes or night sweats. She denies any muscle aches or pains. She denies any lumps or nodules in the breast.  REVIEW OF SYSTEMS:   Constitutional: Denies fevers, chills or abnormal weight loss Eyes: Denies blurriness of vision Ears, nose, mouth, throat, and face: Denies mucositis or sore throat Respiratory: Denies cough, dyspnea or wheezes Cardiovascular: Denies palpitation, chest  discomfort Gastrointestinal:  Denies nausea, heartburn or change in bowel habits Skin: Denies abnormal skin rashes Lymphatics: Denies new lymphadenopathy or easy bruising Neurological:Denies numbness, tingling or new weaknesses Behavioral/Psych: Mood is stable, no new changes  Extremities: No lower extremity edema Breast:  denies any pain or lumps or nodules in either breasts All other systems were reviewed with the patient and are negative.  I have reviewed the past medical history, past surgical history, social history and family history with the patient and they are unchanged from previous note.  ALLERGIES:  is allergic to crab [shellfish allergy]; iodine; incruse ellipta [umeclidinium bromide]; iohexol; lactose intolerance (gi); adhesive [tape]; and latex.  MEDICATIONS:  Current Outpatient Prescriptions  Medication Sig Dispense Refill  . acetaminophen (TYLENOL) 500 MG tablet Take 1,000 mg by mouth every 6 (six) hours as needed (pain).    . Albuterol Sulfate (PROAIR RESPICLICK) 749 (90 Base) MCG/ACT AEPB Inhale 2 puffs into the lungs every 8 (eight) hours as needed. 1 each 11  . ALPRAZolam (XANAX) 1 MG tablet Take 0.5 mg by mouth 2 (two) times daily.     . Ascorbic Acid (VITAMIN C WITH ROSE HIPS) 500 MG tablet Take 500 mg by mouth every morning.     Marland Kitchen atorvastatin (LIPITOR) 20 MG tablet Take 20 mg by mouth daily before breakfast.     . benzonatate (TESSALON) 100 MG capsule Take 1 capsule (100 mg total) by mouth 3 (three) times daily. 21 capsule 0  .  budesonide-formoterol (SYMBICORT) 160-4.5 MCG/ACT inhaler Inhale 2 puffs into the lungs 2 (two) times daily. 1 Inhaler 11  . Calcium Carbonate-Vitamin D (CALCIUM-VITAMIN D) 500-200 MG-UNIT per tablet Take 1 tablet by mouth 3 (three) times a week. Take 1 tablet by mouth Monday, Wednesday, and Friday    . exemestane (AROMASIN) 25 MG tablet TAKE 1 TABLET BY MOUTH AFTER BREAKFAST 90 tablet 3  . Ferrous Gluconate (IRON) 240 (27 FE) MG TABS Take 1  tablet by mouth every morning.     . fluticasone (FLONASE) 50 MCG/ACT nasal spray Place 2 sprays into both nostrils daily. 16 g 2  . guaiFENesin (MUCINEX) 600 MG 12 hr tablet Take 1 tablet (600 mg total) by mouth 2 (two) times daily. 60 tablet 0  . hydrochlorothiazide 25 MG tablet Take 25 mg by mouth daily before breakfast.     . loratadine (CLARITIN) 10 MG tablet Take 1 tablet (10 mg total) by mouth daily. 30 tablet 0  . losartan (COZAAR) 50 MG tablet Take 50 mg by mouth daily.    . metFORMIN (GLUCOPHAGE) 500 MG tablet Take 500 mg by mouth daily with breakfast.     . metoprolol (LOPRESSOR) 100 MG tablet Take 100 mg by mouth daily before breakfast.     . Na Sulfate-K Sulfate-Mg Sulf 17.5-3.13-1.6 GM/180ML SOLN Suprep-Use as directed 354 mL 0  . omeprazole (PRILOSEC) 20 MG capsule Take 20 mg by mouth every morning.     . OXYGEN Inhale into the lungs. Use 2L continuously    . PARoxetine (PAXIL) 40 MG tablet Take 40 mg by mouth daily before breakfast.     . traMADol (ULTRAM) 50 MG tablet Take 50 mg by mouth 2 (two) times daily.      No current facility-administered medications for this visit.     PHYSICAL EXAMINATION: ECOG PERFORMANCE STATUS: 1 - Symptomatic but completely ambulatory  Vitals:   03/01/17 1353  BP: (!) 147/87  Pulse: 83  Resp: 17  Temp: 98 F (36.7 C)   Filed Weights   03/01/17 1353  Weight: 254 lb 9.6 oz (115.5 kg)    GENERAL:alert, no distress and comfortable SKIN: skin color, texture, turgor are normal, no rashes or significant lesions EYES: normal, Conjunctiva are pink and non-injected, sclera clear OROPHARYNX:no exudate, no erythema and lips, buccal mucosa, and tongue normal  NECK: supple, thyroid normal size, non-tender, without nodularity LYMPH:  no palpable lymphadenopathy in the cervical, axillary or inguinal LUNGS: clear to auscultation and percussion with normal breathing effort HEART: regular rate & rhythm and no murmurs and no lower extremity  edema ABDOMEN:abdomen soft, non-tender and normal bowel sounds MUSCULOSKELETAL:no cyanosis of digits and no clubbing  NEURO: alert & oriented x 3 with fluent speech, no focal motor/sensory deficits EXTREMITIES: No lower extremity edema BREAST: No palpable masses or nodules in either right or left breasts. No palpable axillary supraclavicular or infraclavicular adenopathy no breast tenderness or nipple discharge. (exam performed in the presence of a chaperone)  LABORATORY DATA:  I have reviewed the data as listed   Chemistry      Component Value Date/Time   NA 141 03/01/2017 1337   K 3.6 03/01/2017 1337   CL 96 (L) 12/09/2016 0649   CL 100 12/27/2012 1418   CO2 35 (H) 03/01/2017 1337   BUN 21.3 03/01/2017 1337   CREATININE 1.1 03/01/2017 1337      Component Value Date/Time   CALCIUM 10.5 (H) 03/01/2017 1337   ALKPHOS 83 03/01/2017 1337   AST  12 03/01/2017 1337   ALT 9 03/01/2017 1337   BILITOT 0.83 03/01/2017 1337       Lab Results  Component Value Date   WBC 5.9 03/01/2017   HGB 12.8 03/01/2017   HCT 38.7 03/01/2017   MCV 87.8 03/01/2017   PLT 143 (L) 03/01/2017   NEUTROABS 4.0 03/01/2017    ASSESSMENT & PLAN:  Breast cancer of upper-outer quadrant of right female breast ([place]) 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 She underwent re-excision on 01/21/13. Pathology revealed no residual carcinoma or atypia. Did not require radiation currently on hormonal therapy with Aromasin 25 mg daily from 04/02/2013  Aromasin toxicities: No major side effects from Aromasin other than occasional hot flashes and joint pains Breast cancer surveillance: 1. Breast exam done 03/01/2017 is normal 2. Mammograms 02/21/17: Benign, density cat B lumpectomy 01/19/2015: Benign breast tissue  Shortness of breath to minimal exertion:Due to COPD; Hospitalized May 2018  Continue with current treatment with Aromasin and continue with the surveillance  plan.  Lung cancers and colon cancer: In remission no evidence of cancer recurrence clinically  Return to clinic in 1 year for follow-up  I spent 15 minutes talking to the patient of which more than half was spent in counseling and coordination of care.  No orders of the defined types were placed in this encounter.  The patient has a good understanding of the overall plan. she agrees with it. she will call with any problems that may develop before the next visit here.   Rulon Eisenmenger, MD 03/01/17

## 2017-03-30 ENCOUNTER — Telehealth: Payer: Self-pay | Admitting: Internal Medicine

## 2017-03-30 NOTE — Telephone Encounter (Signed)
Left message for patient

## 2017-03-30 NOTE — Telephone Encounter (Signed)
Patient is returning phone call. [phone].

## 2017-03-30 NOTE — Telephone Encounter (Signed)
Called and spoke with pt and she stated that she is on the phone with [employer] at this moment  And she will call back once she is done talking to them.

## 2017-03-30 NOTE — Telephone Encounter (Signed)
Please get her  duoneb 4 times daily through medicare Part B and  Samples of ICS - arnuity  Dr. Brand Males, M.D., [facility].C.P Pulmonary and Critical Care Medicine Staff Physician [place] [facility] Pager: [phone], If no answer or between  15:00h - 7:00h: call [phone]  03/30/2017 5:03 PM

## 2017-03-30 NOTE — Telephone Encounter (Signed)
Called pt back she stated that R[place]on told her that she is in the donut hole until the first of the year and would need generic medications to be able to afford these.  MR pt stated that she cannot afford the symbicort and spiriva at 150 per each inhaler per month.  Do you want to change to nebs for the pt or try something in the generic form?  Thanks

## 2017-03-31 MED ORDER — IPRATROPIUM-ALBUTEROL 0.5-2.5 (3) MG/3ML IN SOLN: 3.0000 mL | Freq: Four times a day (QID) | RESPIRATORY_TRACT | 5 refills | Status: DC

## 2017-03-31 NOTE — Telephone Encounter (Signed)
Spoke with the pt and notified of recs per MR  She verbalized understanding  Rx was sent for Duoneb (she states only needs med, already has machine and supplies) Will forward back to MR to ask about what strength of Arnuity

## 2017-03-31 NOTE — Telephone Encounter (Signed)
Before we call her back again, needing to know what dose of the Arnuity  Please advise, thanks!

## 2017-04-03 ENCOUNTER — Telehealth: Payer: Self-pay | Admitting: Internal Medicine

## 2017-04-03 MED ORDER — TIOTROPIUM BROMIDE MONOHYDRATE 1.25 MCG/ACT IN AERS: 2.0000 | INHALATION_SPRAY | Freq: Every day | RESPIRATORY_TRACT | 0 refills | Status: DC

## 2017-04-03 NOTE — Telephone Encounter (Signed)
Called and spoke to pt. Informed her of the recs per MR. Samples placed up front for pick up. Pt verbalized understanding and denied any further questions or concerns at this time.

## 2017-04-03 NOTE — Telephone Encounter (Signed)
MR please advise of the doseage of the arnuity.  Thanks

## 2017-04-03 NOTE — Telephone Encounter (Signed)
Only reason duoneb given is bcuase of her donut hole issues with spirivan and symbicort. Now please list these as allergies. She has to go back to spiriva/symbicort - give samples I fyou have them  Dr. Brand Males, M.D., [facility].C.P Pulmonary and Critical Care Medicine Staff Physician [place] [facility] Pager: [phone], If no answer or between  15:00h - 7:00h: call [phone]  04/03/2017 1:05 PM

## 2017-04-03 NOTE — Telephone Encounter (Signed)
Called and spoke with pt. Pt states MR prescribed Duoneb qid on 03/31/17. Pt states she developed shaking, blurred vision and jerking after using medication. Pt states last dose of medication was Saturday.    MR please advise. Thanks.

## 2017-04-04 NOTE — Telephone Encounter (Signed)
MR please advise of the arnuity so this can be sent in for the pt.  Thanks

## 2017-04-06 NOTE — Telephone Encounter (Signed)
MR please advise so this may be sent in for the pt.  Thanks

## 2017-04-11 ENCOUNTER — Telehealth: Payer: Self-pay | Admitting: Internal Medicine

## 2017-04-11 MED ORDER — FLUTICASONE FUROATE 50 MCG/ACT IN AEPB: 1.0000 | INHALATION_SPRAY | Freq: Once | RESPIRATORY_TRACT | 3 refills | Status: AC

## 2017-04-11 NOTE — Telephone Encounter (Signed)
Spoke with pt, she states the Symbicort and Spiriva is working after I already sent in [place]. I advised her that she may not be able to afford the sample after they run out and we cannot promise samples because it may a point where we don't have any to give. I advised her to keep the sample for Arnuity at the pharmacy just in case she cannot get the Symbicort and Spiriva. I did advise her to not take the [place] with what she already has. Pt understood. FYI MR. `

## 2017-04-11 NOTE — Telephone Encounter (Signed)
Low dose arnuity fine  Dr. Brand Males, M.D., [facility].C.P Pulmonary and Critical Care Medicine Staff Physician, [place] Director - Interstitial Lung Disease  Pulmonary [place] at [facility], [place], [address]  Pager: [phone], If no answer or between  15:00h - 7:00h: call [phone] Telephone: [phone]

## 2017-04-11 NOTE — Telephone Encounter (Signed)
Sent in Rx to [facility], pt notified. Nothing further is needed.

## 2017-04-12 NOTE — Telephone Encounter (Signed)
Ok thanks 

## 2017-04-28 ENCOUNTER — Encounter: Payer: Self-pay | Admitting: Internal Medicine

## 2017-04-28 ENCOUNTER — Ambulatory Visit (INDEPENDENT_AMBULATORY_CARE_PROVIDER_SITE_OTHER): Payer: Medicare Other | Admitting: Internal Medicine

## 2017-04-28 VITALS — BP 122/74 | HR 70 | Ht 65.0 in | Wt 257.8 lb

## 2017-04-28 DIAGNOSIS — J438 Other emphysema: Secondary | ICD-10-CM

## 2017-04-28 DIAGNOSIS — J9611 Chronic respiratory failure with hypoxia: Secondary | ICD-10-CM | POA: Diagnosis not present

## 2017-04-28 NOTE — Progress Notes (Signed)
Subjective:     Patient ID: Amy [name], female   DOB: 1946-04-05, 71 y.o.   MRN: [id number]  HPI      IOV 10/27/2015  Chief Complaint  Patient presents with  . Pulmonary Consult    Pt self referral for DOE x 1 year. Pt c/o non prod cough with chest congestion x 1 week.     71 year-old morbidly obese female, previous history of 26 pack smoking quit 2 years ago. Also multiple cancer history and cancer survivor was reportedly in complete remission. Last CT scan of the chest in 2013 without any evidence of cancer in the lung. Followed by primary care physician. She reports insidious onset of shortness of breath for the last 1 year. Progressive only somewhat. Overall stable. Moderate in intensity. Ranging clothes walking one flight of stairs or doing groceries brings on her dyspnea. It is relieved by rest. For the last 2 weeks it is associated with cough which is dry but she does not feel sick. There is no fever or hemoptysis or orthopnea paroxysmal nocturnal dyspnea. She is morbidly obese with a BMI greater than 40 but it is unchanged. There is a history of mild sleep apnea on a sleep study per her history 10 years ago.  Walking desaturation test 180 5. 3 laps on room air with a full head probe: She walk only 30 feet and she desaturated to 85%. We walked her final feet and she desaturated to 81%. She improved a pulse ox greater than 92% at rest. She declined any November the oxygen therapy.   Ct chest oct 2013 - personally visualized IMPRESSION:  1. Surgical changes on the right. No evidence of recurrent or metastatic disease. 2. Mild centrilobular emphysema. 3. Pulmonary artery enlargement suggests pulmonary arterial hypertension. 4. Esophageal air fluid level suggests dysmotility or gastroesophageal reflux.   Original Report Authenticated By: Areta Haber, M.D.   OV sept 359   71 year old female former smoker with COPD with emphysema, lung cancer status post right  lobectomy in 2001. 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 2. There also appears to be some degree of associated mucoid impaction within terminal bronchioles throughout the right lower lobe. 3. Mild diffuse bronchial wall thickening with mild centrilobular emphysema; imaging findings suggestive of underlying COPD. 4. Dilatation of the pulmonic trunk (4.1 cm in diameter), suggesting pulmonary arterial hypertension.  04/22/2016 Follow up : COPD/ILD  Patient presents for a two-month follow-up and medication review. Patient was previously on Incruse.  Incruse was not covered by insurance, changed to [employer] Four times a day  .  Says it causes some jitteriness if she does Four times a day  , better on Three times a day  .  Flu shot is utd.  Patient underwent a right heart catheter 03/02/2016 that showed mild pulmonary hypertension with normal PVR.  Says overall she is doing okay. Does get winded with activity that oxygen seems to help. She denies any chest pain, orthopnea, PND, or increased leg swelling.  OV 09/07/2016  Chief Complaint  Patient presents with  . Follow-up    Pt states her SOB is at baseline. Pt denies cough and CP/tightness. Pt states she has changed inhalers so many times d/t side effects.    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 She is overall doing well. She has difficulty affording inhalers but the nebulizes causes side effects. Most recently we had to give her a sample of the new [place] branded inhaler TRELEGY she says this works really well for her. Side effect profile is minimal. Dyspnea is better. There no new  acute issues. She is up-to-date with her flu shot. She seems a little bit unaware of the ILD findings on her CT chest   01/26/2017 Follow up : COPD  Pt returns for 1 month follow up . She had a slow to resolve COPD flare . She was given a steroid taper and started on Spiriva . Remains on Symbicort.  She feels it has helped her. Feels she is back to baseline .  At baseline she gets dyspnea with long walking , heavy lifting . Does grocery shopping but has to rest frequently and use buggy to rest on .   Remains on O2 at 2l/m . Feels it helps her.   OV 04/28/2017  Chief Complaint  Patient presents with  . Follow-up    Pt c/o SOB on exertion. Denies any cough or CP. DME: [employer], 2-3L 24/7.   Follow-up advanced COPD with cor pulmonale on oxygen therapy  She is here for routine follow-up. Currently exacerbation free. She is obese and uses a cane. But she did get to the office by driving. She does activities of daily living. She had intolerance to nebulizers which was started because of the donut hole issues. She is currently back on Spiriva and Symbicort and it is helping her. She remains on 2 L oxygen. She's had a flu shot there no new issues. She is worried about her long-term prognosis      has a past medical history of Allergy; Anemia; Anxiety; Arthritis; Blood dyscrasia; Blood transfusion without reported diagnosis (1999); Breast cancer, right breast ([place]) (10/03/12); Cancer ([place]); Cataract; Chronic kidney disease (1999); Colon cancer ([place]) (1999, 2013); Depression; Diabetes mellitus; GERD (gastroesophageal reflux disease); H/O: lung cancer; Heart murmur; Hernia; History of colon cancer; History of kidney cancer; Hot flashes; breast cancer (2014); Hypertension; Internal hemorrhoids; Interstitial lung disease ([place]) (11/25/2015); Lung cancer ([place]) (2001); Neuromuscular disorder ([place]); Shortness of breath; Sickle cell trait ([place]); Tubular adenoma of colon; and Ulcer.   reports that she quit smoking  about 3 years ago. Her smoking use included Cigarettes. She started smoking about 24 years ago. She has a 26.25 pack-year smoking history. She has never used smokeless tobacco.  Past Surgical History:  Procedure Laterality Date  . ABDOMINAL HYSTERECTOMY  80's   fibroids  . BREAST BIOPSY     Bilateral cysts/benign  . BREAST BIOPSY Right 10/03/12   ER/PR +  . BREAST BIOPSY Right 12/20/2012   Procedure: Right breast Excisional Biopsy;  Surgeon: Rolm Bookbinder, MD;  Location: [facility];  Service: General;  Laterality: Right;  . BREAST LUMPECTOMY WITH NEEDLE LOCALIZATION Right 12/20/2012   Procedure: BREAST LUMPECTOMY WITH NEEDLE LOCALIZATION;  Surgeon: Rolm Bookbinder, MD;  Location: [facility];  Service: General;  Laterality: Right;  . BREAST LUMPECTOMY WITH RADIOACTIVE SEED LOCALIZATION Right 01/19/2015   Procedure: RIGHT BREAST LUMPECTOMY WITH RADIOACTIVE SEED LOCALIZATION;  Surgeon: Rolm Bookbinder, MD;  Location: [facility];  Service: General;  Laterality: Right;  . CARDIAC CATHETERIZATION N/A 03/02/2016   Procedure: Right Heart Cath;  Surgeon: Jolaine Artist, MD;  Location: [facility];  Service: Cardiovascular;  Laterality: N/A;  . COLECTOMY  05-31-2012   laparoscopic ,possible open  . COLON SURGERY     chemo  . COLONOSCOPY    . [place]   right/chemo kidney removed for cancer  . LUNG LOBECTOMY     right lower  . PARTIAL COLECTOMY  05/31/2012   Procedure: PARTIAL COLECTOMY;  Surgeon: Rolm Bookbinder, MD;  Location: [facility];  Service: General;  Laterality: N/A;  . PARTIAL HYSTERECTOMY    . POLYPECTOMY    . RE-EXCISION OF BREAST CANCER,SUPERIOR MARGINS Right 01/21/2013   Procedure: RE-EXCISION OF BREAST CANCER,SUPERIOR MARGINS;  Surgeon: Rolm Bookbinder, MD;  Location: [facility];  Service: General;  Laterality: Right;  . TONSILLECTOMY     as child  . TUBAL LIGATION      Allergies  Allergen Reactions  . Crab [Shellfish Allergy] Anaphylaxis  .  Iodine Anaphylaxis  . Albuterol-Ipratropium [Ipratropium-Albuterol]     Tremors, blurred vision.   Keenan Bachelor Ellipta [Umeclidinium Bromide] Other (See Comments)    Trembling, blurred vision  . Iohexol      Code: HIVES, Desc: pt states , swelling mouth , throat, and has difficulty breathing   . Lactose Intolerance (Gi)   . Adhesive [Tape] Rash  . Latex Itching and Rash    Immunization History  Administered Date(s) Administered  . Influenza, High Dose Seasonal PF 04/18/2016  . Influenza,inj,Quad PF,6+ Mos 08/26/2015  . Influenza-Unspecified 04/26/2017  . Pneumococcal Conjugate-13 08/26/2015    Family History  Problem Relation Age of Onset  . Colon polyps Brother   . Multiple myeloma Brother 48  . Breast cancer Mother        dx in her 64s  . Heart disease Father   . Colon cancer Paternal Aunt   . Breast cancer Maternal Grandmother   . Colon cancer Cousin        2 paternal first cousins  . Leukemia Cousin 11  . Esophageal cancer Neg Hx   . Rectal cancer Neg Hx   . Stomach cancer Neg Hx      Current Outpatient Prescriptions:  .  acetaminophen (TYLENOL) 500 MG tablet, Take 1,000 mg by mouth every 6 (six) hours as needed (pain)., Disp: , Rfl:  .  Albuterol Sulfate (PROAIR RESPICLICK) 628 (90 Base) MCG/ACT AEPB, Inhale 2 puffs into the lungs every 8 (eight) hours as needed., Disp: 1 each, Rfl: 11 .  ALPRAZolam (XANAX) 1 MG tablet, Take 0.5 mg by mouth 2 (two) times daily. , Disp: , Rfl:  .  Ascorbic Acid (VITAMIN C WITH ROSE HIPS) 500 MG tablet, Take 500 mg by mouth every morning. , Disp: , Rfl:  .  atorvastatin (LIPITOR) 20 MG tablet, Take 20 mg by mouth daily before breakfast. , Disp: , Rfl:  .  budesonide-formoterol (SYMBICORT) 160-4.5 MCG/ACT inhaler, Inhale 2 puffs into the lungs 2 (two) times daily., Disp: 1 Inhaler, Rfl: 11 .  Calcium Carbonate-Vitamin D (CALCIUM-VITAMIN D) 500-200 MG-UNIT per tablet, Take 1 tablet by mouth 3 (three) times a week. Take 1 tablet by mouth  Monday, Wednesday, and Friday, Disp: , Rfl:  .  exemestane (AROMASIN) 25 MG tablet, TAKE 1 TABLET BY MOUTH AFTER BREAKFAST, Disp: 90 tablet, Rfl: 3 .  Ferrous Gluconate (IRON) 240 (27 FE) MG TABS, Take 1 tablet by mouth every morning. , Disp: , Rfl:  .  hydrochlorothiazide 25 MG tablet, Take 25 mg by mouth daily before breakfast. , Disp: , Rfl:  .  losartan (COZAAR) 50 MG tablet, Take  50 mg by mouth daily., Disp: , Rfl:  .  metFORMIN (GLUCOPHAGE) 500 MG tablet, Take 500 mg by mouth daily with breakfast. , Disp: , Rfl:  .  metoprolol (LOPRESSOR) 100 MG tablet, Take 100 mg by mouth daily before breakfast. , Disp: , Rfl:  .  omeprazole (PRILOSEC) 20 MG capsule, Take 20 mg by mouth every morning. , Disp: , Rfl:  .  OXYGEN, Inhale into the lungs. Use 2L continuously, Disp: , Rfl:  .  PARoxetine (PAXIL) 40 MG tablet, Take 40 mg by mouth daily before breakfast. , Disp: , Rfl:  .  Tiotropium Bromide Monohydrate (SPIRIVA RESPIMAT) 1.25 MCG/ACT AERS, Inhale 2 puffs into the lungs daily., Disp: 2 Inhaler, Rfl: 0 .  traMADol (ULTRAM) 50 MG tablet, Take 50 mg by mouth 2 (two) times daily. , Disp: , Rfl:    Review of Systems     Objective:   Physical Exam Vitals:   04/28/17 1216  BP: 122/74  Pulse: 70  SpO2: 96%    Estimated body mass index is 41.09 kg/m as calculated from the following:   Height as of 03/01/17: 5' 6"  (1.676 m).   Weight as of 03/01/17: 254 lb 9.6 oz (115.5 kg). Obese female using a cane. Pleasant alert and oriented 3. Speech normal. Normal heart sounds. Normal lung sounds distant. Oral cavity normal without thrush abdomen obese. Walks with a cane    Assessment:       ICD-10-CM   1. Chronic respiratory failure with hypoxia (HCC) J96.11   2. Other emphysema ([place]) J43.8        Plan:      Stable disease  Plan - Continue oxygen, Spiriva and Symbicort and glad you had flu shot  Follow-up 3 months or sooner   Dr. Brand Males, M.D., [facility].C.P Pulmonary and Critical  Care Medicine Staff Physician [facility] Pager: [phone], If no answer or between  15:00h - 7:00h: call [phone]  04/28/2017 12:26 PM

## 2017-04-28 NOTE — Patient Instructions (Signed)
ICD-10-CM   1. Chronic respiratory failure with hypoxia (HCC) J96.11   2. Other emphysema ([place]) J43.8     Stable disease  Plan - Continue oxygen, Spiriva and Symbicort and glad you had flu shot  Follow-up 3 months or sooner

## 2017-05-15 ENCOUNTER — Telehealth: Payer: Self-pay | Admitting: Internal Medicine

## 2017-05-15 NOTE — Telephone Encounter (Signed)
Please disregard message. Patient was at wrong office. Amy [name]

## 2017-06-08 ENCOUNTER — Encounter: Payer: Self-pay | Admitting: Hematology and Oncology

## 2017-06-08 NOTE — Progress Notes (Signed)
Received referral from pharmacy regarding patient needing assistance with Aromasin.  Researched and found she may be able to apply for assistance through [employer]. Called patient to introduce myself and to advise about [place]. Patient states her primary doctor has reached out to them to help get other medications. Provided patient with the number to [place] to follow up and to inquire about Aromasin assistance.  Asked patient if she would like to have a Medicaid application mailed to her. She states she does not wish to apply because she owns her home.

## 2017-06-12 ENCOUNTER — Telehealth: Payer: Self-pay

## 2017-06-12 NOTE — Telephone Encounter (Signed)
Pt called asking if there is anything we can do to help with her Aromasin script. She is unable to pay her copay. [place] is already working with her on this. I told her I would find out if there is anything else we can do in the mean time and will follow up with pt.  Cyndia Bent RN

## 2017-06-13 ENCOUNTER — Encounter: Payer: Self-pay | Admitting: Hematology and Oncology

## 2017-06-13 ENCOUNTER — Telehealth: Payer: Self-pay

## 2017-06-13 NOTE — Telephone Encounter (Signed)
Unable to get samples for pt.  Cyndia Bent RN

## 2017-06-13 NOTE — Progress Notes (Signed)
Following up on staff message from RN regarding Aromasin assistance.  Health and safety inspector) to inquire about application for patient. She states patient called and an application was mailed to her on 06/08/17 to complete as well as a list of supporting documents needed to go with the application. She states once everything has been received, the processing of the application normally takes 2-4 business days and if approved by 07/24/17, they can use the current income information to re-enroll her for 2019.   Called patient to relay this information to her. Patient has not yet received the application but understands what to do when she receives. Patient asked with her not taking in for a period of time, would that affect her in any way. Advised patient that would be a questions for the RN. She verbalized understanding.

## 2017-06-26 ENCOUNTER — Encounter: Payer: Self-pay | Admitting: Hematology and Oncology

## 2017-06-26 ENCOUNTER — Other Ambulatory Visit: Payer: Self-pay

## 2017-06-26 NOTE — Progress Notes (Signed)
Received voicemail from patient regarding [place] form for assistance with Aromasin.  Called patient back to confirm she received her portion to complete. She states she did. Advised her I would leave physician form to be completed and fax back and she could mail back her portion. Patient verbalized understanding and has my contact name and number for any additional financial questions or concerns.  Left form with Tiffany RN to have physician to complete and sign form and advised she may fax or return to me to fax.

## 2017-06-27 ENCOUNTER — Other Ambulatory Visit: Payer: Self-pay

## 2017-06-28 ENCOUNTER — Telehealth: Payer: Self-pay | Admitting: Internal Medicine

## 2017-06-28 MED FILL — EXEMESTANE 25 MG TABLET: 25 | 30 days supply | Qty: 30 | Fill #3

## 2017-06-28 NOTE — Telephone Encounter (Signed)
Forms have been placed in MR's look at cubby for review. Will route to [facility] to make aware. Thanks.

## 2017-06-29 NOTE — Telephone Encounter (Signed)
Will give these forms to MR once he is back in the office next week.

## 2017-07-05 NOTE — Telephone Encounter (Signed)
Patient Assistance forms have been signed by MR and have been mailed for pt. Nothing further needed.

## 2017-07-20 ENCOUNTER — Telehealth: Payer: Self-pay | Admitting: Internal Medicine

## 2017-07-20 NOTE — Telephone Encounter (Signed)
lmtcb x1 for [place] with [employer].

## 2017-07-21 NOTE — Telephone Encounter (Signed)
This form was signed by MR and faxed 06/27/2017 and the form was completed per the key that [employer] sent [place]. I have called and ask them to resubmit with the correct information and I will get MR to sign again

## 2017-07-21 NOTE — Telephone Encounter (Signed)
[name] calling back from [facility]. [phone] ext [phone]

## 2017-07-21 NOTE — Telephone Encounter (Signed)
I looked through all of MR's paper work and do not see this form was sent back for him to initial  Rodena Piety- do you have this one? Please advise, thanks!

## 2017-07-21 NOTE — Telephone Encounter (Signed)
Called and spoke with [name] at [facility]---  She stated that the pt does have portable oxygen----so for question #4 was answered wrong and this will need to be marked out and circle yes and initial and date the correction and fax back to [place] at [phone].  Will forward back to [place] to make her aware.

## 2017-07-21 NOTE — Telephone Encounter (Signed)
I just spoke with [name] at [facility] and I tried to explain that the copy that we have has already been signed and scanned into Epic. That she should just send a new form with the correct information on the key this time. She stated that she would make sure the information was correct and resend the CMN

## 2017-07-21 NOTE — Telephone Encounter (Signed)
I now have the new form for Dr. Chase Caller to sign and It has been placed in his CMN folder It does have the corrected information on the CMN

## 2017-07-26 ENCOUNTER — Telehealth: Payer: Self-pay

## 2017-07-26 NOTE — Telephone Encounter (Signed)
Will route message to [facility] for follow up once MR signs this form.

## 2017-07-26 NOTE — Telephone Encounter (Signed)
Returned pt call stating she had some breast pain like a pinching feeling. She was on Aromasin but was out for two weeks. Once she restarted the medication the pain went away. Informed her this was most likely a hormonal issue and as long as she isn't having any issues, pain, hot to touch, swelling, or redness she does not need to come in right now. She is to call if any of that changes.  Cyndia Bent RN

## 2017-07-28 NOTE — Telephone Encounter (Signed)
MR is not back in the office until mid- next week Will continue to hold in Emily's box to await MR's signature

## 2017-08-02 NOTE — Telephone Encounter (Signed)
EP please advise on any updates. Thanks.

## 2017-08-04 NOTE — Telephone Encounter (Signed)
Raquel Sarna, please advise. Thanks!

## 2017-08-04 NOTE — Telephone Encounter (Signed)
Still waiting on MR to sign the paperwork. Once it is signed, I will give the CMN folder back to [place].

## 2017-08-07 NOTE — Telephone Encounter (Signed)
Yes I have faxed everything that he had and a CMN on Mrs. Spiller was faxed 08/04/2017

## 2017-08-07 NOTE — Telephone Encounter (Signed)
Amy [name], just checking with you to see if MR had signed the paperwork due to not getting the CMN folder back from him when he handed me all the other papers he had signed.  Please advise.  Thanks!

## 2017-10-03 ENCOUNTER — Telehealth: Payer: Self-pay | Admitting: Internal Medicine

## 2017-10-03 NOTE — Telephone Encounter (Signed)
Spoke with pt. She is needing samples of Spiriva Respimat and Symbicort. We do not have samples at this time. Nothing further was needed.

## 2017-10-09 ENCOUNTER — Telehealth: Payer: Self-pay | Admitting: Internal Medicine

## 2017-10-09 NOTE — Telephone Encounter (Signed)
Called and spoke with patient, she states that she has patient assistance forms that need to be filled out by [place] and wanted to let us know that she was going to be bringing them up here. Will route this to EP so that she can follow up on this. Advised patient that we don't keep albuterol or proair samples. Patient understood.

## 2017-10-12 NOTE — Telephone Encounter (Signed)
Checked MR's cubby and folder up front, unable to locate patient assistance forms.  Called pt for update.  Pt states she will be PCP office will be helping her complete forms and will fax forms over.  84mo rov has been scheduled for 11/16/17.  Will route to EP to f/u on.

## 2017-10-16 ENCOUNTER — Encounter: Payer: Self-pay | Admitting: Hematology and Oncology

## 2017-10-16 NOTE — Progress Notes (Signed)
Received approval letter for [employer] for Aromasin for patient.  Patient enrolled through 07/24/18.

## 2017-10-17 NOTE — Telephone Encounter (Signed)
Raquel Sarna can you please give an update on this matter. Thanks.

## 2017-10-19 NOTE — Telephone Encounter (Signed)
Amy [name] please address

## 2017-10-19 NOTE — Telephone Encounter (Signed)
OK THANKS

## 2017-10-19 NOTE — Telephone Encounter (Signed)
We have not received any forms from pt to be filled out yet.  Attempted to call pt to see if she was going to be bring the forms to our office for [place] to fill out for her but unable to reach her.   Left message for pt to call us back.

## 2017-10-19 NOTE — Telephone Encounter (Signed)
Called and spoke with pt to ask if she had brought the papers to our office yet to have them filled out by MR, and she stated she had not yet done so.  I asked pt if she was going to wait until her OV with [place] 11/15/17 to bring them at that visit or if she was planning on bringing the papers to our office before then.  Pt stated she will be bringing them to our office as soon as she can; she had a pipe burst on her washing machine and had been trying to wait for someone to come over to fix that before she left the house to bring the forms to [place].  I stated to pt as soon as she brought the forms to our office, the front staff people would give the papers to me and then I would give them to MR to have him sign and complete for pt.  Pt expressed understanding. Will route to MR to make him aware of my conversation with pt.

## 2017-11-16 ENCOUNTER — Encounter: Payer: Self-pay | Admitting: Internal Medicine

## 2017-11-16 ENCOUNTER — Ambulatory Visit: Payer: Medicare Other | Admitting: Internal Medicine

## 2017-11-16 VITALS — BP 120/84 | HR 80 | Ht 65.0 in | Wt 250.6 lb

## 2017-11-16 DIAGNOSIS — J9611 Chronic respiratory failure with hypoxia: Secondary | ICD-10-CM

## 2017-11-16 DIAGNOSIS — J438 Other emphysema: Secondary | ICD-10-CM

## 2017-11-16 MED ORDER — TIOTROPIUM BROMIDE MONOHYDRATE 1.25 MCG/ACT IN AERS: 2.0000 | INHALATION_SPRAY | Freq: Every day | RESPIRATORY_TRACT | 0 refills | Status: DC

## 2017-11-16 MED ORDER — BUDESONIDE-FORMOTEROL FUMARATE 160-4.5 MCG/ACT IN AERO: 2.0000 | INHALATION_SPRAY | Freq: Two times a day (BID) | RESPIRATORY_TRACT | 0 refills | Status: DC

## 2017-11-16 NOTE — Addendum Note (Signed)
Addended by: Lorretta Harp on: 11/16/2017 05:14 PM   Modules accepted: Orders

## 2017-11-16 NOTE — Progress Notes (Signed)
Subjective:     Patient ID: Amy [name], female   DOB: [birth year], 72 y.o.   MRN: [id number]  HPI    IOV 10/27/2015  Chief Complaint  Patient presents with  . Pulmonary Consult    Pt self referral for DOE x 1 year. Pt c/o non prod cough with chest congestion x 1 week.     72 year-old morbidly obese female, previous history of 26 pack smoking quit 2 years ago. Also multiple cancer history and cancer survivor was reportedly in complete remission. Last CT scan of the chest in 2013 without any evidence of cancer in the lung. Followed by primary care physician. She reports insidious onset of shortness of breath for the last 1 year. Progressive only somewhat. Overall stable. Moderate in intensity. Ranging clothes walking one flight of stairs or doing groceries brings on her dyspnea. It is relieved by rest. For the last 2 weeks it is associated with cough which is dry but she does not feel sick. There is no fever or hemoptysis or orthopnea paroxysmal nocturnal dyspnea. She is morbidly obese with a BMI greater than 40 but it is unchanged. There is a history of mild sleep apnea on a sleep study per her history 10 years ago.  Walking desaturation test 180 5. 3 laps on room air with a full head probe: She walk only 30 feet and she desaturated to 85%. We walked her final feet and she desaturated to 81%. She improved a pulse ox greater than 92% at rest. She declined any November the oxygen therapy.   Ct chest oct 2013 - personally visualized IMPRESSION:  1. Surgical changes on the right. No evidence of recurrent or metastatic disease. 2. Mild centrilobular emphysema. 3. Pulmonary artery enlargement suggests pulmonary arterial hypertension. 4. Esophageal air fluid level suggests dysmotility or gastroesophageal reflux.   Original Report Authenticated By: Areta Haber, M.D.   OV sept 6476   72 year old female former smoker with COPD with emphysema, lung cancer status post right  lobectomy in 2001. 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 2. There also appears to be some degree of associated mucoid impaction within terminal bronchioles throughout the right lower lobe. 3. Mild diffuse bronchial wall thickening with mild centrilobular emphysema; imaging findings suggestive of underlying COPD. 4. Dilatation of the pulmonic trunk (4.1 cm in diameter), suggesting pulmonary arterial hypertension.  04/22/2016 Follow up : COPD/ILD  Patient presents for a two-month follow-up and medication review. Patient was previously on Incruse.  Incruse was not covered by insurance, changed to [employer] Four times a day  .  Says it causes some jitteriness if she does Four times a day  , better on Three times a day  .  Flu shot is utd.  Patient underwent a right heart catheter 03/02/2016 that showed mild pulmonary hypertension with normal PVR.  Says overall she is doing okay. Does get winded with activity that oxygen seems to help. She denies any chest pain, orthopnea, PND, or increased leg swelling.  OV 09/07/2016  Chief Complaint  Patient presents with  . Follow-up    Pt states her SOB is at baseline. Pt denies cough and CP/tightness. Pt states she has changed inhalers so many times d/t side effects.    Follow-up multifactorial chronic hypoxemic respiratory failure due to emphysema, mildly not otherwise specified seen on CT just spring 2017, obesity, cor pulmonale,  history of lobectomy  I personally not seen her since spring of 2017. She is overall doing well. She has difficulty affording inhalers but the nebulizes causes side effects. Most recently we had to give her a sample of the new [place] branded inhaler TRELEGY she says this works really well for her. Side effect profile is minimal. Dyspnea is better. There no new  acute issues. She is up-to-date with her flu shot. She seems a little bit unaware of the ILD findings on her CT chest   01/26/2017 Follow up : COPD  Pt returns for 1 month follow up . She had a slow to resolve COPD flare . She was given a steroid taper and started on Spiriva . Remains on Symbicort.  She feels it has helped her. Feels she is back to baseline .  At baseline she gets dyspnea with long walking , heavy lifting . Does grocery shopping but has to rest frequently and use buggy to rest on .   Remains on O2 at 2l/m . Feels it helps her.   OV 04/28/2017  Chief Complaint  Patient presents with  . Follow-up    Pt c/o SOB on exertion. Denies any cough or CP. DME: [employer], 2-3L 24/7.   Follow-up advanced COPD with cor pulmonale on oxygen therapy  She is here for routine follow-up. Currently exacerbation free. She is obese and uses a cane. But she did get to the office by driving. She does activities of daily living. She had intolerance to nebulizers which was started because of the donut hole issues. She is currently back on Spiriva and Symbicort and it is helping her. She remains on 2 L oxygen. She's had a flu shot there no new issues. She is worried about her long-term prognosis   OV 11/16/2017  Chief Complaint  Patient presents with  . Follow-up    Pt states she has been doing okay since last visit. States she is becoming SOB more frequently.     Follow-up advanced COPD with cor pulmonale therapy.  High-resolution CT chest June 2018 without interstitial lung disease and micro nodules are stable not requiring for the follow-up  Overall COPD stable.  Last seen October 2018.  She is obese and has a cane.  No interim exacerbations or changes in medications.  She is on triple inhaler therapy.  She has affordability issues and she is asking for samples.  We did fill out med assistance program with [employer] or [employer].  She remains on 2 L oxygen.  She states she is depressed on Paxil.  Her  brother has advised her to see a psychotherapist but she feels she just needs a change in medication.  Her only child deceased 23 years ago.  Her husband is deceased.  Her parents are deceased.  And her brother has multiple myeloma.  Therefore all this is waiting in on her because she lives alone and does not have any social family anymore.      has a past medical history of Allergy, Anemia, Anxiety, Arthritis, Blood dyscrasia, Blood transfusion without reported diagnosis (1999), Breast cancer, right breast ([place]) (10/03/12), Cancer ([place]), Cataract, Chronic kidney disease (1999), Colon cancer ([place]) (1999, 2013), Depression, Diabetes mellitus, GERD (gastroesophageal reflux disease), H/O: lung cancer, Heart murmur, Hernia, History of colon cancer, History of kidney cancer, Hot flashes, breast cancer (2014), Hypertension, Internal hemorrhoids, Interstitial lung disease ([place]) (11/25/2015), Lung cancer ([place]) (2001), Neuromuscular disorder ([place]), Shortness of breath, Sickle cell trait ([place]), Tubular adenoma of colon,  and Ulcer.   reports that she quit smoking about 3 years ago. Her smoking use included cigarettes. She started smoking about 25 years ago. She has a 26.25 pack-year smoking history. She has never used smokeless tobacco.    Past Surgical History:  Procedure Laterality Date  . ABDOMINAL HYSTERECTOMY  80's   fibroids  . BREAST BIOPSY     Bilateral cysts/benign  . BREAST BIOPSY Right 10/03/12   ER/PR +  . BREAST BIOPSY Right 12/20/2012   Procedure: Right breast Excisional Biopsy;  Surgeon: Rolm Bookbinder, MD;  Location: [facility];  Service: General;  Laterality: Right;  . BREAST LUMPECTOMY WITH NEEDLE LOCALIZATION Right 12/20/2012   Procedure: BREAST LUMPECTOMY WITH NEEDLE LOCALIZATION;  Surgeon: Rolm Bookbinder, MD;  Location: [facility];  Service: General;  Laterality: Right;  . BREAST LUMPECTOMY WITH RADIOACTIVE SEED LOCALIZATION Right 01/19/2015   Procedure: RIGHT BREAST LUMPECTOMY WITH  RADIOACTIVE SEED LOCALIZATION;  Surgeon: Rolm Bookbinder, MD;  Location: [facility];  Service: General;  Laterality: Right;  . CARDIAC CATHETERIZATION N/A 03/02/2016   Procedure: Right Heart Cath;  Surgeon: Jolaine Artist, MD;  Location: [facility];  Service: Cardiovascular;  Laterality: N/A;  . COLECTOMY  05-31-2012   laparoscopic ,possible open  . COLON SURGERY     chemo  . COLONOSCOPY    . [place]   right/chemo kidney removed for cancer  . LUNG LOBECTOMY     right lower  . PARTIAL COLECTOMY  05/31/2012   Procedure: PARTIAL COLECTOMY;  Surgeon: Rolm Bookbinder, MD;  [facility];  Service: General;  Laterality: N/A;  . PARTIAL HYSTERECTOMY    . POLYPECTOMY    . RE-EXCISION OF BREAST CANCER,SUPERIOR MARGINS Right 01/21/2013   Procedure: RE-EXCISION OF BREAST CANCER,SUPERIOR MARGINS;  Surgeon: Rolm Bookbinder, MD;  [facility];  Service: General;  Laterality: Right;  . TONSILLECTOMY     as child  . TUBAL LIGATION      Allergies  Allergen Reactions  . Crab [Shellfish Allergy] Anaphylaxis  . Iodine Anaphylaxis  . Albuterol-Ipratropium [Ipratropium-Albuterol]     Tremors, blurred vision.   Keenan Bachelor Ellipta [Umeclidinium Bromide] Other (See Comments)    Trembling, blurred vision  . Iohexol      Code: HIVES, Desc: pt states , swelling mouth , throat, and has difficulty breathing   . Lactose Intolerance (Gi)   . Adhesive [Tape] Rash  . Latex Itching and Rash    Immunization History  Administered Date(s) Administered  . Influenza, High Dose Seasonal PF 04/18/2016  . Influenza,inj,Quad PF,6+ Mos 08/26/2015  . Influenza-Unspecified 04/26/2017  . Pneumococcal Conjugate-13 08/26/2015    Family History  Problem Relation Age of Onset  . Colon polyps Brother   . Multiple myeloma Brother 60  . Breast cancer Mother        dx in her 57s  . Heart disease Father   . Colon cancer Paternal Aunt   . Breast cancer Maternal  Grandmother   . Colon cancer Cousin        2 paternal first cousins  . Leukemia Cousin 11  . Esophageal cancer Neg Hx   . Rectal cancer Neg Hx   . Stomach cancer Neg Hx      Current Outpatient Medications:  .  acetaminophen (TYLENOL) 500 MG tablet, Take 1,000 mg by mouth every 6 (six) hours as needed (pain)., Disp: , Rfl:  .  Albuterol Sulfate (PROAIR RESPICLICK) 076 (90 Base) MCG/ACT AEPB, Inhale 2 puffs into  the lungs every 8 (eight) hours as needed., Disp: 1 each, Rfl: 11 .  ALPRAZolam (XANAX) 1 MG tablet, Take 0.5 mg by mouth 2 (two) times daily. , Disp: , Rfl:  .  Ascorbic Acid (VITAMIN C WITH ROSE HIPS) 500 MG tablet, Take 500 mg by mouth every morning. , Disp: , Rfl:  .  atorvastatin (LIPITOR) 20 MG tablet, Take 20 mg by mouth daily before breakfast. , Disp: , Rfl:  .  budesonide-formoterol (SYMBICORT) 160-4.5 MCG/ACT inhaler, Inhale 2 puffs into the lungs 2 (two) times daily., Disp: 1 Inhaler, Rfl: 11 .  Calcium Carbonate-Vitamin D (CALCIUM-VITAMIN D) 500-200 MG-UNIT per tablet, Take 1 tablet by mouth 3 (three) times a week. Take 1 tablet by mouth Monday, Wednesday, and Friday, Disp: , Rfl:  .  exemestane (AROMASIN) 25 MG tablet, TAKE 1 TABLET BY MOUTH AFTER BREAKFAST, Disp: 90 tablet, Rfl: 3 .  Ferrous Gluconate (IRON) 240 (27 FE) MG TABS, Take 1 tablet by mouth every morning. , Disp: , Rfl:  .  hydrochlorothiazide 25 MG tablet, Take 25 mg by mouth daily before breakfast. , Disp: , Rfl:  .  losartan (COZAAR) 50 MG tablet, Take 50 mg by mouth daily., Disp: , Rfl:  .  metFORMIN (GLUCOPHAGE) 500 MG tablet, Take 500 mg by mouth daily with breakfast. , Disp: , Rfl:  .  metoprolol (LOPRESSOR) 100 MG tablet, Take 100 mg by mouth daily before breakfast. , Disp: , Rfl:  .  omeprazole (PRILOSEC) 20 MG capsule, Take 20 mg by mouth every morning. , Disp: , Rfl:  .  OXYGEN, Inhale into the lungs. Use 2L continuously, Disp: , Rfl:  .  PARoxetine (PAXIL) 40 MG tablet, Take 40 mg by mouth daily  before breakfast. , Disp: , Rfl:  .  Tiotropium Bromide Monohydrate (SPIRIVA RESPIMAT) 1.25 MCG/ACT AERS, Inhale 2 puffs into the lungs daily., Disp: 2 Inhaler, Rfl: 0 .  traMADol (ULTRAM) 50 MG tablet, Take 50 mg by mouth 2 (two) times daily. , Disp: , Rfl:    Review of Systems     Objective:   Physical Exam Vitals:   11/16/17 1613  BP: 120/84  Pulse: 80  SpO2: 94%  Weight: 250 lb 9.6 oz (113.7 kg)  Height: 5' 5"  (1.651 m)    Estimated body mass index is 41.7 kg/m as calculated from the following:   Height as of this encounter: 5' 5"  (1.651 m).   Weight as of this encounter: 250 lb 9.6 oz (113.7 kg).   General Appearance:    Looks chronic  ill OBESE - +  Head:    Normocephalic, without obvious abnormality, atraumatic  Eyes:    PERRL - yes, conjunctiva/corneas - clear      Ears:    Normal external ear canals, both ears  Nose:   NG tube - no but has o2   Throat:  ETT TUBE - no , OG tube - no  Neck:   Supple,  No enlargement/tenderness/nodules     Lungs:     Clear to auscultation bilaterally,   Chest wall:    No deformity  Heart:    S1 and S2 normal, no murmur, CVP - no.  Pressors - no  Abdomen:     Soft, no masses, no organomegaly  Genitalia:    Not done  Rectal:   not done  Extremities:   Extremities- intact. HAS CANE     Skin:   Intact in exposed areas .  Neurologic:   Sedation - none -> RASS - +1 with flat affect . Moves all 4s - yes. CAM-ICU - neg . Orientation - x3+        Assessment:       ICD-10-CM   1. Chronic respiratory failure with hypoxia (HCC) J96.11   2. Other emphysema (HCC) J43.8        Plan:      Stable  Plan Continue o2, spiriva, and symbicort - take samples of each Albuterol as needed  follolwup 9 months or sooner if needed - CAT score at followup   Dr. Brand Males, M.D., [facility].C.P Pulmonary and Critical Care Medicine Staff Physician, [place] Director - Interstitial Lung Disease  Program  Pulmonary  [place] [facility] [place], [place], [address]  Pager: [phone], If no answer or between  15:00h - 7:00h: call [phone] Telephone: [phone]

## 2017-11-16 NOTE — Patient Instructions (Addendum)
ICD-10-CM   1. Chronic respiratory failure with hypoxia (HCC) J96.11   2. Other emphysema (HCC) J43.8    Stable  Plan Continue o2, spiriva, and symbicort - take samples of each Albuterol as needed  follolwup 9 months or sooner if needed - CAT score at followup

## 2017-12-29 ENCOUNTER — Telehealth: Payer: Self-pay | Admitting: Internal Medicine

## 2017-12-29 MED ORDER — TIOTROPIUM BROMIDE MONOHYDRATE 1.25 MCG/ACT IN AERS: 2.0000 | INHALATION_SPRAY | Freq: Every day | RESPIRATORY_TRACT | 0 refills | Status: DC

## 2017-12-29 MED ORDER — TIOTROPIUM BROMIDE MONOHYDRATE 1.25 MCG/ACT IN AERS: 2.0000 | INHALATION_SPRAY | Freq: Every day | RESPIRATORY_TRACT | 5 refills | Status: DC

## 2017-12-29 NOTE — Telephone Encounter (Signed)
Spoke with pt. She is requesting samples of Spiriva Respimat. Samples have been left up front for pick up. She also requests that a prescription be sent to her pharmacy. This has been taken care of. Nothing further was needed.

## 2018-01-24 ENCOUNTER — Ambulatory Visit (INDEPENDENT_AMBULATORY_CARE_PROVIDER_SITE_OTHER): Payer: Medicare Other | Admitting: Obstetrics and Gynecology

## 2018-01-24 ENCOUNTER — Encounter: Payer: Self-pay | Admitting: Obstetrics and Gynecology

## 2018-01-24 ENCOUNTER — Other Ambulatory Visit: Payer: Self-pay

## 2018-01-24 VITALS — BP 116/70 | HR 92 | Resp 16 | Ht 64.0 in | Wt 250.0 lb

## 2018-01-24 DIAGNOSIS — Z01419 Encounter for gynecological examination (general) (routine) without abnormal findings: Secondary | ICD-10-CM

## 2018-01-24 MED ORDER — NYSTATIN 100000 UNIT/GM EX POWD: Freq: Three times a day (TID) | CUTANEOUS | 2 refills | Status: DC

## 2018-01-24 NOTE — Patient Instructions (Signed)

## 2018-01-24 NOTE — Progress Notes (Signed)
72 y.o. G51P1010 Widowed [place] American female here for annual exam.    Normal pelvic US 2016 done due to limited pelvic exam.   Will have cataract surgery.   Voiding often due to water intake.  Night time frequency is 2 - 3 times.  May be due to her bladder or not sleeping well and then decides to void.  Able to sleep again.  No urinary incontinence. Drinks tea once to twice a week.   Diarrhea if she eats something she is not supposed to.   On Paxil 40 mg.     PCP: Dr. Delfina Redwood     Patient's last menstrual period was 07/26/1983.           Sexually active: No.  The current method of family planning is hysterectomy -- ovaries remain.    Exercising: No.  The patient does not participate in regular exercise at present. Smoker:  no  Health Maintenance: Pap:  06/14/12 wnl History of abnormal Pap:  no MMG:  02/21/17 BIRADS 2 Benign/density b.   Colonoscopy:  10/08/14-Polyps. Repeat in 2 yrs.  She will schedule. BMD:   04/01/16  Result  Normal TDaP:  unsure Gardasil:   n/a HIV: never Hep C: never Screening Labs:  PCP   reports that she quit smoking about 4 years ago. Her smoking use included cigarettes. She started smoking about 25 years ago. She has a 26.25 pack-year smoking history. She has never used smokeless tobacco. She reports that she does not drink alcohol or use drugs.  Past Medical History:  Diagnosis Date  . Allergy   . Anemia   . Anxiety   . Arthritis   . Blood dyscrasia    Sickle Cell traits  . Blood transfusion without reported diagnosis 1999   prbc S/P COLON SURGERY  . Breast cancer, right breast ([place]) 10/03/12  . Cancer ([place])    colon,renal,lung  . Cataract    NO SURGERY  . Chronic kidney disease 1999   RIGHT KIDNEY REMOVED  . Colon cancer ([place]) 1999, 2013  . Depression   . Diabetes mellitus    diet controlled  . GERD (gastroesophageal reflux disease)   . H/O: lung cancer   . Heart murmur   . Hernia    Near umbilicus  . History of colon cancer    . History of kidney cancer   . Hot flashes    5 yr pill for breast CA causing hot flashes  . HX: breast cancer 2014   --right breast  . Hypertension    Does not see a cardiologist  . Internal hemorrhoids   . Interstitial lung disease ([place]) 11/25/2015  . Lung cancer ([place]) 2001  . Neuromuscular disorder ([place])    PERIPHERAL NEUROPATHY FEET  . Shortness of breath    with exertion   . Sickle cell trait ([place])   . Tubular adenoma of colon   . Ulcer    HX YEARS AGO    Past Surgical History:  Procedure Laterality Date  . ABDOMINAL HYSTERECTOMY  80's   fibroids  . BREAST BIOPSY     Bilateral cysts/benign  . BREAST BIOPSY Right 10/03/12   ER/PR +  . BREAST BIOPSY Right 12/20/2012   Procedure: Right breast Excisional Biopsy;  Surgeon: Rolm Bookbinder, MD;  Location: [facility];  Service: General;  Laterality: Right;  . BREAST LUMPECTOMY WITH NEEDLE LOCALIZATION Right 12/20/2012   Procedure: BREAST LUMPECTOMY WITH NEEDLE LOCALIZATION;  Surgeon: Rolm Bookbinder, MD;  Location: [facility];  Service:  General;  Laterality: Right;  . BREAST LUMPECTOMY WITH RADIOACTIVE SEED LOCALIZATION Right 01/19/2015   Procedure: RIGHT BREAST LUMPECTOMY WITH RADIOACTIVE SEED LOCALIZATION;  Surgeon: Rolm Bookbinder, MD;  Location: [facility];  Service: General;  Laterality: Right;  . CARDIAC CATHETERIZATION N/A 03/02/2016   Procedure: Right Heart Cath;  Surgeon: Jolaine Artist, MD;  Location: [facility];  Service: Cardiovascular;  Laterality: N/A;  . COLECTOMY  05-31-2012   laparoscopic ,possible open  . COLON SURGERY     chemo  . COLONOSCOPY    . [place]   right/chemo kidney removed for cancer  . LUNG LOBECTOMY     right lower  . PARTIAL COLECTOMY  05/31/2012   Procedure: PARTIAL COLECTOMY;  Surgeon: Rolm Bookbinder, MD;  [facility];  Service: General;  Laterality: N/A;  . PARTIAL HYSTERECTOMY    . POLYPECTOMY    . RE-EXCISION OF BREAST CANCER,SUPERIOR MARGINS  Right 01/21/2013   Procedure: RE-EXCISION OF BREAST CANCER,SUPERIOR MARGINS;  Surgeon: Rolm Bookbinder, MD;  [facility];  Service: General;  Laterality: Right;  . TONSILLECTOMY     as child  . TUBAL LIGATION      Current Outpatient Medications  Medication Sig Dispense Refill  . acetaminophen (TYLENOL) 500 MG tablet Take 1,000 mg by mouth every 6 (six) hours as needed (pain).    . Albuterol Sulfate (PROAIR RESPICLICK) 696 (90 Base) MCG/ACT AEPB Inhale 2 puffs into the lungs every 8 (eight) hours as needed. 1 each 11  . ALPRAZolam (XANAX) 1 MG tablet Take 0.5 mg by mouth 2 (two) times daily.     . Ascorbic Acid (VITAMIN C WITH ROSE HIPS) 500 MG tablet Take 500 mg by mouth every morning.     Marland Kitchen atorvastatin (LIPITOR) 20 MG tablet Take 20 mg by mouth daily before breakfast.     . budesonide-formoterol (SYMBICORT) 160-4.5 MCG/ACT inhaler Inhale 2 puffs into the lungs 2 (two) times daily. 1 Inhaler 11  . Calcium Carbonate-Vitamin D (CALCIUM-VITAMIN D) 500-200 MG-UNIT per tablet Take 1 tablet by mouth 3 (three) times a week. Take 1 tablet by mouth Monday, Wednesday, and Friday    . exemestane (AROMASIN) 25 MG tablet TAKE 1 TABLET BY MOUTH AFTER BREAKFAST 90 tablet 3  . Ferrous Gluconate (IRON) 240 (27 FE) MG TABS Take 1 tablet by mouth every morning.     . hydrochlorothiazide 25 MG tablet Take 25 mg by mouth daily before breakfast.     . losartan (COZAAR) 50 MG tablet Take 50 mg by mouth daily.    . metFORMIN (GLUCOPHAGE) 500 MG tablet Take 500 mg by mouth daily with breakfast.     . metoprolol (LOPRESSOR) 100 MG tablet Take 100 mg by mouth daily before breakfast.     . omeprazole (PRILOSEC) 20 MG capsule Take 20 mg by mouth every morning.     . OXYGEN Inhale into the lungs. Use 2L continuously    . PARoxetine (PAXIL) 40 MG tablet Take 40 mg by mouth daily before breakfast.     . Tiotropium Bromide Monohydrate (SPIRIVA RESPIMAT) 1.25 MCG/ACT AERS Inhale 2 puffs into the lungs daily. 2 Inhaler  0  . Tiotropium Bromide Monohydrate (SPIRIVA RESPIMAT) 1.25 MCG/ACT AERS Inhale 2 puffs into the lungs daily. 1 Inhaler 5  . traMADol (ULTRAM) 50 MG tablet Take 50 mg by mouth 2 (two) times daily.      No current facility-administered medications for this visit.     Family History  Problem Relation Age of Onset  . Colon polyps Brother   . Multiple myeloma Brother 20  . Breast cancer Mother        dx in her 22s  . Heart disease Father   . Colon cancer Paternal Aunt   . Breast cancer Maternal Grandmother   . Colon cancer Cousin        2 paternal first cousins  . Leukemia Cousin 11  . Esophageal cancer Neg Hx   . Rectal cancer Neg Hx   . Stomach cancer Neg Hx     Review of Systems  Constitutional: Negative.   HENT: Negative.   Eyes: Negative.   Respiratory: Negative.   Cardiovascular: Negative.   Gastrointestinal: Negative.   Endocrine: Negative.   Genitourinary: Negative.   Musculoskeletal: Negative.   Skin:       Hair loss Dry skin/hair  Allergic/Immunologic: Negative.   Neurological: Negative.   Hematological: Negative.   Psychiatric/Behavioral: Negative.     Exam:   BP 116/70 (BP Location: Left Arm, Patient Position: Sitting, Cuff Size: Large)   Pulse 92   Resp 16   Ht _0  (1.626 m)   Wt 250 lb (113.4 kg)   LMP 07/26/1983   BMI 42.91 kg/m     General appearance: alert, cooperative and appears stated age. Using O2.  Head: Normocephalic, without obvious abnormality, atraumatic.  Sebaceous cyst 7 mm of anterior midline neck.  Neck: no adenopathy, supple, symmetrical, trachea midline and thyroid normal to inspection and palpation Lungs: clear to auscultation bilaterally Breasts: normal appearance, no masses or tenderness, No nipple retraction or dimpling, No nipple discharge or bleeding, No axillary or supraclavicular adenopathy Heart: regular rate and rhythm Abdomen: obese, soft, non-tender; no masses, no organomegaly.  Some erythematous patch under pannus on  right.  Extremities: extremities normal, atraumatic, no cyanosis or edema Skin: Skin color, texture, turgor normal. No rashes or lesions Lymph nodes: Cervical, supraclavicular, and axillary nodes normal. No abnormal inguinal nodes palpated Neurologic: Grossly normal  Pelvic: External genitalia:  no lesions              Urethra:  normal appearing urethra with no masses, tenderness or lesions              Bartholins and Skenes: normal                 Vagina: normal appearing vagina with normal color and discharge, no lesions.  Vaginal atrophy noted.               Cervix:  Absent.              Pap taken: No. Bimanual Exam:  Uterus:  Absent.              Adnexa: no mass, fullness, tenderness.  Exam limited by [facility].               Rectal exam: Yes.  .  Confirms.              Anus:  normal sphincter tone, no lesions  Chaperone was present for exam.  Assessment:   Well woman visit with normal exam. Status post TAH for fibroids.  Hx multiple malignancies.  Right breast, colon , right kidney, and lung.  Vaginal atrophy.  Candida of flexural folds.  COPD.  On O2.   Plan: Mammogram screening this month.  Recommended self breast awareness. Pap and HR HPV as above. Guidelines for Calcium, Vitamin D, regular exercise program including cardiovascular  and weight bearing exercise. We talked about pelvic ultrasound to check her ovaries, and we will wait for another year to do this.  She will schedule her colonoscopy.  Nystatin powder.  Follow up annually and prn.   After visit summary provided.

## 2018-01-30 ENCOUNTER — Other Ambulatory Visit: Payer: Self-pay | Admitting: Internal Medicine

## 2018-01-30 DIAGNOSIS — Z1231 Encounter for screening mammogram for malignant neoplasm of breast: Secondary | ICD-10-CM

## 2018-02-27 ENCOUNTER — Ambulatory Visit: Payer: Medicare Other

## 2018-02-28 ENCOUNTER — Other Ambulatory Visit: Payer: Self-pay | Admitting: Internal Medicine

## 2018-03-01 ENCOUNTER — Inpatient Hospital Stay: Payer: Medicare Other | Admitting: Hematology and Oncology

## 2018-03-01 ENCOUNTER — Telehealth: Payer: Self-pay | Admitting: Internal Medicine

## 2018-03-01 MED ORDER — BUDESONIDE-FORMOTEROL FUMARATE 160-4.5 MCG/ACT IN AERO: 2.0000 | INHALATION_SPRAY | Freq: Two times a day (BID) | RESPIRATORY_TRACT | 11 refills | Status: DC

## 2018-03-01 NOTE — Assessment & Plan Note (Deleted)
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 She underwent re-excision on 01/21/13. Pathology revealed no residual carcinoma or atypia. Did not require radiation currently on hormonal therapy with Aromasin 25 mg daily from 04/02/2013  Aromasin toxicities: No major side effects from Aromasin other than occasional hot flashes and joint pains Breast cancer surveillance: 1. Breast exam done 8/8/2019is normal 2. Mammograms  to be done 03/23/2018 lumpectomy 01/19/2015: Benign breast tissue  Continue with current treatment with Aromasin and continue with the surveillance plan.  Lung cancers and colon cancer: In remission no evidence of cancer recurrence clinically

## 2018-03-01 NOTE — Telephone Encounter (Signed)
rx sent to preferred pharmacy.  Left detailed message to make pt aware.  Nothing further needed.

## 2018-03-20 ENCOUNTER — Telehealth: Payer: Self-pay | Admitting: Hematology and Oncology

## 2018-03-20 ENCOUNTER — Inpatient Hospital Stay: Payer: Medicare Other | Attending: Hematology and Oncology | Admitting: Hematology and Oncology

## 2018-03-20 DIAGNOSIS — Z85038 Personal history of other malignant neoplasm of large intestine: Secondary | ICD-10-CM | POA: Insufficient documentation

## 2018-03-20 DIAGNOSIS — D0511 Intraductal carcinoma in situ of right breast: Secondary | ICD-10-CM | POA: Diagnosis not present

## 2018-03-20 DIAGNOSIS — Z85118 Personal history of other malignant neoplasm of bronchus and lung: Secondary | ICD-10-CM | POA: Diagnosis not present

## 2018-03-20 DIAGNOSIS — C3491 Malignant neoplasm of unspecified part of right bronchus or lung: Secondary | ICD-10-CM

## 2018-03-20 DIAGNOSIS — Z170 Estrogen receptor positive status [ER+]: Secondary | ICD-10-CM

## 2018-03-20 DIAGNOSIS — Z79811 Long term (current) use of aromatase inhibitors: Secondary | ICD-10-CM | POA: Diagnosis not present

## 2018-03-20 NOTE — Telephone Encounter (Signed)
Gave patient avs and calendar.   °

## 2018-03-20 NOTE — Assessment & Plan Note (Signed)
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 She underwent re-excision on 01/21/13. Pathology revealed no residual carcinoma or atypia. Did not require radiation currently on hormonal therapy with Aromasin 25 mg daily from 04/02/2013  Aromasin toxicities: No major side effects from Aromasin other than occasional hot flashes and joint pains.  Breast cancer surveillance: 1. Breast exam done 8/27/2019is normal 2. Mammograms  scheduled for 03/23/2018  Shortness of breath to minimal exertion:Due to COPD; Hospitalized May 2018  Continue with current treatment with Aromasin and continue with the surveillance plan.

## 2018-03-20 NOTE — Progress Notes (Signed)
Patient Care Team: Seward Carol, MD as PCP - General (Internal Medicine)  DIAGNOSIS:  Encounter Diagnosis  Name Primary?  . 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 Arlyce Dice     Breast cancer of upper-outer quadrant of right female breast ([place])   12/20/2012 Surgery    Right breast lumpectomy: DCIS, resection for margin 01/21/2013, ER 100%, PR 100%; did not undergo radiation therapy    01/30/2013 -  Anti-estrogen oral therapy    Aromasin 25 mg daily    01/19/2015 Surgery    Rt Lumpectomy: No cancer seen     Colon cancer ([place])   05/31/2012 Surgery    Right: Segmental resection: Invasive well-differentiated mucinous adenocarcinoma invading the submucosa, no angiolymphatic invasion, 0/9 lymph nodes positive, T1 N0 M0, loss of expression of PMS 2, and MLH 1     CHIEF COMPLIANT: Follow-up of right breast cancer on Aromasin.  Complaining of tenderness in the left breast  INTERVAL HISTORY: Amy [name] is a 72 year old with above-mentioned history of right breast DCIS who underwent a lumpectomy followed by antiestrogen therapy with Aromasin.  She has been on it for the past 5 years and appears to be tolerating it extremely well.  She is today complaining of pain in the left breast.  She has a mammogram coming up this Friday.   REVIEW OF SYSTEMS:   Constitutional: Denies fevers, chills or abnormal weight loss Eyes: Denies blurriness of vision Ears, nose, mouth, throat, and face: Denies mucositis or sore throat Respiratory: Denies cough, dyspnea or wheezes Cardiovascular: Denies palpitation, chest discomfort Gastrointestinal:  Denies nausea, heartburn or change in bowel habits Skin: Denies abnormal skin rashes Lymphatics: Denies new lymphadenopathy or easy bruising Neurological:Denies  numbness, tingling or new weaknesses Behavioral/Psych: Mood is stable, no new changes  Extremities: No lower extremity edema Breast: Pain in the left breast All other systems were reviewed with the patient and are negative.  I have reviewed the past medical history, past surgical history, social history and family history with the patient and they are unchanged from previous note.  ALLERGIES:  is allergic to crab [shellfish allergy]; iodine; albuterol-ipratropium [ipratropium-albuterol]; incruse ellipta [umeclidinium bromide]; iohexol; lactose intolerance (gi); adhesive [tape]; and latex.  MEDICATIONS:  Current Outpatient Medications  Medication Sig Dispense Refill  . acetaminophen (TYLENOL) 500 MG tablet Take 1,000 mg by mouth every 6 (six) hours as needed (pain).    . Albuterol Sulfate (PROAIR RESPICLICK) 962 (90 Base) MCG/ACT AEPB Inhale 2 puffs into the lungs every 8 (eight) hours as needed. 1 each 11  . ALPRAZolam (XANAX) 1 MG tablet Take 0.5 mg by mouth 2 (two) times daily.     . Ascorbic Acid (VITAMIN C WITH ROSE HIPS) 500 MG tablet Take 500 mg by mouth every morning.     Marland Kitchen atorvastatin (LIPITOR) 20 MG tablet Take 20 mg by mouth daily before breakfast.     . budesonide-formoterol (SYMBICORT) 160-4.5 MCG/ACT inhaler Inhale 2 puffs into the lungs 2 (two) times daily. 10.2 g 11  . Calcium Carbonate-Vitamin D (CALCIUM-VITAMIN D) 500-200 MG-UNIT per tablet Take 1 tablet by mouth 3 (three) times a week. Take 1 tablet by mouth Monday, Wednesday, and Friday    . exemestane (AROMASIN) 25 MG tablet TAKE 1 TABLET BY MOUTH  AFTER BREAKFAST 90 tablet 3  . Ferrous Gluconate (IRON) 240 (27 FE) MG TABS Take 1 tablet by mouth every morning.     . hydrochlorothiazide 25 MG tablet Take 25 mg by mouth daily before breakfast.     . losartan (COZAAR) 50 MG tablet Take 50 mg by mouth daily.    . metFORMIN (GLUCOPHAGE) 500 MG tablet Take 500 mg by mouth daily with breakfast.     . metoprolol (LOPRESSOR) 100  MG tablet Take 100 mg by mouth daily before breakfast.     . nystatin (MYCOSTATIN/NYSTOP) powder Apply topically 3 (three) times daily. Apply to affected area for up to 7 days 30 g 2  . omeprazole (PRILOSEC) 20 MG capsule Take 20 mg by mouth every morning.     . OXYGEN Inhale into the lungs. Use 2L continuously    . PARoxetine (PAXIL) 40 MG tablet Take 40 mg by mouth daily before breakfast.     . Tiotropium Bromide Monohydrate (SPIRIVA RESPIMAT) 1.25 MCG/ACT AERS Inhale 2 puffs into the lungs daily. 2 Inhaler 0  . Tiotropium Bromide Monohydrate (SPIRIVA RESPIMAT) 1.25 MCG/ACT AERS Inhale 2 puffs into the lungs daily. 1 Inhaler 5  . traMADol (ULTRAM) 50 MG tablet Take 50 mg by mouth 2 (two) times daily.      No current facility-administered medications for this visit.     PHYSICAL EXAMINATION: ECOG PERFORMANCE STATUS: 1 - Symptomatic but completely ambulatory  Vitals:   03/20/18 1424  BP: 117/60  Pulse: 76  Resp: 18  Temp: 98.4 F (36.9 C)  SpO2: 97%   Filed Weights   03/20/18 1424  Weight: 247 lb 14.4 oz (112.4 kg)    GENERAL:alert, no distress and comfortable SKIN: skin color, texture, turgor are normal, no rashes or significant lesions EYES: normal, Conjunctiva are pink and non-injected, sclera clear OROPHARYNX:no exudate, no erythema and lips, buccal mucosa, and tongue normal  NECK: supple, thyroid normal size, non-tender, without nodularity LYMPH:  no palpable lymphadenopathy in the cervical, axillary or inguinal LUNGS: clear to auscultation and percussion with normal breathing effort HEART: regular rate & rhythm and no murmurs and no lower extremity edema ABDOMEN:abdomen soft, non-tender and normal bowel sounds MUSCULOSKELETAL:no cyanosis of digits and no clubbing  NEURO: alert & oriented x 3 with fluent speech, no focal motor/sensory deficits EXTREMITIES: No lower extremity edema BREAST left breast pain and tenderness, palpable lumpiness at the site of the pain.  (exam  performed in the presence of a chaperone)  LABORATORY DATA:  I have reviewed the data as listed CMP Latest Ref Rng & Units 03/01/2017 12/09/2016 12/08/2016  Glucose 70 - 140 mg/dl 157(H) 278(H) 229(H)  BUN 7.0 - 26.0 mg/dL 21.3 20 21(H)  Creatinine 0.6 - 1.1 mg/dL 1.1 0.88 0.97  Sodium 136 - 145 mEq/L 141 139 136  Potassium 3.5 - 5.1 mEq/L 3.6 3.7 3.2(L)  Chloride 101 - 111 mmol/L - 96(L) 94(L)  CO2 22 - 29 mEq/L 35(H) 30 28  Calcium 8.4 - 10.4 mg/dL 10.5(H) 9.5 9.3  Total Protein 6.4 - 8.3 g/dL 7.0 - -  Total Bilirubin 0.20 - 1.20 mg/dL 0.83 - -  Alkaline Phos 40 - 150 U/L 83 - -  AST 5 - 34 U/L 12 - -  ALT 0 - 55 U/L 9 - -    Lab Results  Component Value Date   WBC 5.9 03/01/2017   HGB 12.8 03/01/2017   HCT 38.7 03/01/2017   MCV 87.8 03/01/2017   PLT 143 (  L) 03/01/2017   NEUTROABS 4.0 03/01/2017    ASSESSMENT & PLAN:  Non-small cell cancer of right lung ([facility]) 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 She underwent re-excision on 01/21/13. Pathology revealed no residual carcinoma or atypia. Did not require radiation currently on hormonal therapy with Aromasin 25 mg daily from 04/02/2013  Aromasin toxicities: No major side effects from Aromasin other than occasional hot flashes and joint pains.  Breast cancer surveillance: 1. Breast exam done 8/27/2019is normal 2. Mammograms  scheduled for 03/23/2018  Shortness of breath to minimal exertion:Due to COPD; Hospitalized May 2018  Continue with current treatment with Aromasin and continue with the surveillance plan.    No orders of the defined types were placed in this encounter.  The patient has a good understanding of the overall plan. she agrees with it. she will call with any problems that may develop before the next visit here.   Harriette Ohara, MD 03/20/18

## 2018-03-23 ENCOUNTER — Ambulatory Visit
Admission: RE | Admit: 2018-03-23 | Discharge: 2018-03-23 | Disposition: A | Discharge status: Home / Self Care | Payer: Medicare Other | Source: Ambulatory Visit | Attending: Internal Medicine | Admitting: Internal Medicine

## 2018-03-23 DIAGNOSIS — Z1231 Encounter for screening mammogram for malignant neoplasm of breast: Secondary | ICD-10-CM

## 2018-04-25 ENCOUNTER — Telehealth: Payer: Self-pay | Admitting: Internal Medicine

## 2018-04-25 NOTE — Telephone Encounter (Signed)
Spoke with pt. She is requesting samples of Spiriva and Symbicort. Samples have been left up front pick up. Nothing further was needed.

## 2018-05-02 ENCOUNTER — Telehealth: Payer: Self-pay

## 2018-05-02 NOTE — Telephone Encounter (Signed)
Returned patient's call regarding Aromasin.  Patient questioning if she needs to continue to take medication.  After Dr. Lindi Adie reviewed medication and questions, recommendations were that it is okay for patient to stop taking the medication, Aromasin.   Patient notified and voiced understanding.  No further needs at this time.

## 2018-05-10 ENCOUNTER — Ambulatory Visit: Payer: Medicare Other | Admitting: Gastroenterology

## 2018-05-10 ENCOUNTER — Encounter: Payer: Self-pay | Admitting: Gastroenterology

## 2018-05-10 VITALS — BP 108/80 | HR 72 | Ht 64.0 in | Wt 250.0 lb

## 2018-05-10 DIAGNOSIS — Z85038 Personal history of other malignant neoplasm of large intestine: Secondary | ICD-10-CM

## 2018-05-10 DIAGNOSIS — J9611 Chronic respiratory failure with hypoxia: Secondary | ICD-10-CM

## 2018-05-10 NOTE — Patient Instructions (Signed)
We will contact you in a couple of weeks to schedule your hospital procedure for December.   Thank you for choosing me and [facility].  Pricilla Riffle. Dagoberto Ligas., MD., [facility]

## 2018-05-10 NOTE — Progress Notes (Signed)
History of Present Illness: This is a 72 year old female self referred for the evaluation of a personal history of colon cancer.  She underwent right hemicolectomy in 1999 for colon cancer.  She had a metachronous colon cancer in 2013 and underwent a partial colectomy.  She has chronic respiratory failure and she is now maintained on 2 L by nasal cannula continuously.  Her respiratory status is at its baseline.  She states she has intermittent diarrhea with certain foods such as cabbage, raw vegetables and milk products.  No diarrhea when she avoids these foods.  No other gastrointestinal complaints.  CBC, LFTs and CEA performed in August 2018 were unremarkable except for a slightly low platelet count.  Denies weight loss, abdominal pain, constipation, change in stool caliber, melena, hematochezia, nausea, vomiting, dysphagia, reflux symptoms, chest pain.   Allergies  Allergen Reactions  . Crab [Shellfish Allergy] Anaphylaxis  . Iodine Anaphylaxis  . Albuterol-Ipratropium [Ipratropium-Albuterol]     Tremors, blurred vision.   Keenan Bachelor Ellipta [Umeclidinium Bromide] Other (See Comments)    Trembling, blurred vision  . Iohexol      Code: HIVES, Desc: pt states , swelling mouth , throat, and has difficulty breathing   . Lactose Intolerance (Gi)   . Adhesive [Tape] Rash  . Latex Itching and Rash   Outpatient Medications Prior to Visit  Medication Sig Dispense Refill  . acetaminophen (TYLENOL) 500 MG tablet Take 1,000 mg by mouth every 6 (six) hours as needed (pain).    . Albuterol Sulfate (PROAIR RESPICLICK) 097 (90 Base) MCG/ACT AEPB Inhale 2 puffs into the lungs every 8 (eight) hours as needed. 1 each 11  . ALPRAZolam (XANAX) 1 MG tablet Take 0.5 mg by mouth 2 (two) times daily.     . Ascorbic Acid (VITAMIN C WITH ROSE HIPS) 500 MG tablet Take 500 mg by mouth every morning.     Marland Kitchen atorvastatin (LIPITOR) 20 MG tablet Take 20 mg by mouth daily before breakfast.     . budesonide-formoterol  (SYMBICORT) 160-4.5 MCG/ACT inhaler Inhale 2 puffs into the lungs 2 (two) times daily. 10.2 g 11  . Calcium Carbonate-Vitamin D (CALCIUM-VITAMIN D) 500-200 MG-UNIT per tablet Take 1 tablet by mouth 3 (three) times a week. Take 1 tablet by mouth Monday, Wednesday, and Friday    . Ferrous Gluconate (IRON) 240 (27 FE) MG TABS Take 1 tablet by mouth every morning.     . hydrochlorothiazide 25 MG tablet Take 25 mg by mouth daily before breakfast.     . losartan (COZAAR) 50 MG tablet Take 50 mg by mouth daily.    . metFORMIN (GLUCOPHAGE) 500 MG tablet Take 500 mg by mouth daily with breakfast.     . metoprolol (LOPRESSOR) 100 MG tablet Take 100 mg by mouth daily before breakfast.     . omeprazole (PRILOSEC) 20 MG capsule Take 20 mg by mouth every morning.     . OXYGEN Inhale into the lungs. Use 2L continuously    . PARoxetine (PAXIL) 40 MG tablet Take 40 mg by mouth daily before breakfast.     . Tiotropium Bromide Monohydrate (SPIRIVA RESPIMAT) 1.25 MCG/ACT AERS Inhale 2 puffs into the lungs daily. 1 Inhaler 5  . traMADol (ULTRAM) 50 MG tablet Take 50 mg by mouth 2 (two) times daily.     Marland Kitchen nystatin (MYCOSTATIN/NYSTOP) powder Apply topically 3 (three) times daily. Apply to affected area for up to 7 days 30 g 2  . Tiotropium Bromide Monohydrate (  SPIRIVA RESPIMAT) 1.25 MCG/ACT AERS Inhale 2 puffs into the lungs daily. 2 Inhaler 0   No facility-administered medications prior to visit.    Past Medical History:  Diagnosis Date  . Allergy   . Anemia   . Anxiety   . Arthritis   . Blood dyscrasia    Sickle Cell traits  . Blood transfusion without reported diagnosis 1999   prbc S/P COLON SURGERY  . Breast cancer, right breast ([place]) 10/03/12  . Cancer ([place])    colon,renal,lung  . Cataract    NO SURGERY  . Chronic kidney disease 1999   RIGHT KIDNEY REMOVED  . Colon cancer ([place]) 1999, 2013  . Depression   . Diabetes mellitus    diet controlled  . GERD (gastroesophageal reflux disease)   . H/O:  lung cancer   . Heart murmur   . Hernia    Near umbilicus  . History of colon cancer   . History of kidney cancer   . Hot flashes    5 yr pill for breast CA causing hot flashes  . HX: breast cancer 2014   --right breast  . Hypertension    Does not see a cardiologist  . Internal hemorrhoids   . Interstitial lung disease ([place]) 11/25/2015  . Lung cancer ([place]) 2001  . Neuromuscular disorder ([place])    PERIPHERAL NEUROPATHY FEET  . Shortness of breath    with exertion   . Sickle cell trait ([place])   . Tubular adenoma of colon   . Ulcer    HX YEARS AGO   Past Surgical History:  Procedure Laterality Date  . ABDOMINAL HYSTERECTOMY  80's   fibroids  . BREAST BIOPSY     Bilateral cysts/benign  . BREAST BIOPSY Right 10/03/12   ER/PR +  . BREAST BIOPSY Right 12/20/2012   Procedure: Right breast Excisional Biopsy;  Surgeon: Rolm Bookbinder, MD;  Location: [facility];  Service: General;  Laterality: Right;  . BREAST LUMPECTOMY    . BREAST LUMPECTOMY WITH NEEDLE LOCALIZATION Right 12/20/2012   Procedure: BREAST LUMPECTOMY WITH NEEDLE LOCALIZATION;  Surgeon: Rolm Bookbinder, MD;  Location: [place];  Service: General;  Laterality: Right;  . BREAST LUMPECTOMY WITH RADIOACTIVE SEED LOCALIZATION Right 01/19/2015   Procedure: RIGHT BREAST LUMPECTOMY WITH RADIOACTIVE SEED LOCALIZATION;  Surgeon: Rolm Bookbinder, MD;  Location: [facility];  Service: General;  Laterality: Right;  . CARDIAC CATHETERIZATION N/A 03/02/2016   Procedure: Right Heart Cath;  Surgeon: Jolaine Artist, MD;  Location: [facility];  Service: Cardiovascular;  Laterality: N/A;  . CATARACT EXTRACTION, BILATERAL  01/2018 &02/2018  . COLECTOMY  05-31-2012   laparoscopic ,possible open  . COLON SURGERY     chemo  . COLONOSCOPY    . [place]   right/chemo kidney removed for cancer  . LUNG LOBECTOMY     right lower  . PARTIAL COLECTOMY  05/31/2012   Procedure: PARTIAL COLECTOMY;  Surgeon: Rolm Bookbinder, MD;  Location: [facility];  Service: General;  Laterality: N/A;  . PARTIAL HYSTERECTOMY    . POLYPECTOMY    . RE-EXCISION OF BREAST CANCER,SUPERIOR MARGINS Right 01/21/2013   Procedure: RE-EXCISION OF BREAST CANCER,SUPERIOR MARGINS;  Surgeon: Rolm Bookbinder, MD;  Location: [facility];  Service: General;  Laterality: Right;  . TONSILLECTOMY     as child  . TUBAL LIGATION     Social History   Socioeconomic History  . Marital status: Widowed    Spouse name: Not on file  .  Number of children: Not on file  . Years of education: Not on file  . Highest education level: Not on file  Occupational History  . Occupation: retired  Scientific laboratory technician  . Financial resource strain: Not on file  . Food insecurity:    Worry: Not on file    Inability: Not on file  . Transportation needs:    Medical: Not on file    Non-medical: Not on file  Tobacco Use  . Smoking status: Former Smoker    Packs/day: 1.25    Years: 21.00    Pack years: 26.25    Types: Cigarettes    Start date: 10/30/1992    Last attempt to quit: 11/20/2013    Years since quitting: 4.4  . Smokeless tobacco: Never Used  . Tobacco comment: quit then started up again 2years ago--smokes 1pack every 2-3 days  Substance and Sexual Activity  . Alcohol use: No    Alcohol/week: 0.0 standard drinks  . Drug use: No  . Sexual activity: Not Currently    Partners: Male    Birth control/protection: Surgical    Comment: TAH--ovaries retained  Lifestyle  . Physical activity:    Days per week: Not on file    Minutes per session: Not on file  . Stress: Not on file  Relationships  . Social connections:    Talks on phone: Not on file    Gets together: Not on file    Attends religious service: Not on file    Active member of club or organization: Not on file    Attends meetings of clubs or organizations: Not on file    Relationship status: Not on file  Other Topics Concern  . Not on file  Social History Narrative  . Not on file    Family History  Problem Relation Age of Onset  . Colon polyps Brother   . Multiple myeloma Brother 79  . Breast cancer Mother        dx in her 27s  . Heart disease Father   . Colon cancer Paternal Aunt   . Breast cancer Maternal Grandmother   . Colon cancer Cousin        2 paternal first cousins  . Leukemia Cousin 11  . Esophageal cancer Neg Hx   . Rectal cancer Neg Hx   . Stomach cancer Neg Hx        Review of Systems: Pertinent positive and negative review of systems were noted in the above HPI section. All other review of systems were otherwise negative.   Physical Exam: General: Well developed, well nourished, no acute distress Head: Normocephalic and atraumatic Eyes:  sclerae anicteric, EOMI Ears: Normal auditory acuity Mouth: No deformity or lesions Neck: Supple, no masses or thyromegaly Lungs: Clear throughout to auscultation Heart: Regular rate and rhythm; no murmurs, rubs or bruits Abdomen: Soft, non tender and non distended. No masses, hepatosplenomegaly or hernias noted. Normal Bowel sounds Rectal: Deferred to colonoscopy  Musculoskeletal: Symmetrical with no gross deformities  Skin: No lesions on visible extremities Pulses:  Normal pulses noted Extremities: No clubbing, cyanosis, edema or deformities noted Neurological: Alert oriented x 4, grossly nonfocal Cervical Nodes:  No significant cervical adenopathy Inguinal Nodes: No significant inguinal adenopathy Psychological:  Alert and cooperative. Normal mood and affect   Assessment and Recommendations:  1.  Personal history of metachronous colon cancer, overdue for surveillance colonoscopy.  Status post right hemicolectomy in 1999 and partial colectomy in 2013. Schedule colonoscopy at hospital due to  chronic respiratory failure and oxygen usage. The risks (including bleeding, perforation, infection, missed lesions, medication reactions and possible hospitalization or surgery if complications occur),  benefits, and alternatives to colonoscopy with possible biopsy and possible polypectomy were discussed with the patient and they consent to proceed.  She is followed in oncology by Dr. Lindi Adie with her last visit in August 2019.  2.  Chronic respiratory failure maintained on 2 L nasal cannula O2.

## 2018-05-29 ENCOUNTER — Ambulatory Visit: Payer: Medicare Other | Admitting: Internal Medicine

## 2018-05-29 ENCOUNTER — Ambulatory Visit (INDEPENDENT_AMBULATORY_CARE_PROVIDER_SITE_OTHER): Payer: Medicare Other | Admitting: Nurse Practitioner

## 2018-05-29 ENCOUNTER — Encounter: Payer: Self-pay | Admitting: Nurse Practitioner

## 2018-05-29 DIAGNOSIS — J9611 Chronic respiratory failure with hypoxia: Secondary | ICD-10-CM | POA: Diagnosis not present

## 2018-05-29 MED ORDER — NYSTATIN 100000 UNIT/ML MT SUSP: 5.0000 mL | Freq: Four times a day (QID) | OROMUCOSAL | 0 refills | Status: DC

## 2018-05-29 NOTE — Patient Instructions (Addendum)
When walked in office today patient's sats dropped to 82%, sats returned to 97% when placed on 3L [place] Patient is on O2 at home continuously Patient needs POC for ADL's - please order Inogen POC Will order nystatin swish and spit for thrush - use after each meal and at bedtime Follow up with Dr. Chase Caller as scheduled or sooner if needed

## 2018-05-29 NOTE — Progress Notes (Signed)
@Patient  ID: Amy [name], female    DOB: [birth year], 72 y.o.   MRN: [id number]  Chief Complaint  Patient presents with  . Follow-up    needs to qualify for POC    Referring provider: Seward Carol, MD  HPI 72 year old female former smoker with ILD, COPD/emphysema, and non small cell right lung cancer followed by Dr. Chase Caller.    Tests:  CAT Score 05/29/2018  Total CAT Score 29   PFT Results Latest Ref Rng & Units 11/18/2015  FVC-Pre L 1.49  FVC-Predicted Pre % 59  FVC-Post L 1.77  FVC-Predicted Post % 70  Pre FEV1/FVC % % 58  Post FEV1/FCV % % 54  FEV1-Pre L 0.86  FEV1-Predicted Pre % 44  DLCO UNC% % 23  DLCO COR %Predicted % 48  TLC L 4.49  TLC % Predicted % 86  RV % Predicted % 125     OV 05/29/18 - Needs POC  Patient presents today to qualify for a POC. Patient was walked in offce today sats dropped to 82% and was placed on 3 L O2 and sats came up 97%. Overall she states that she is doing well. Patient is compliant with Symbicort, Spiriva, and pro air. She complains today of some mouth and throat discomfort. She thinks she has thrush from inhaler use. She denies any chest pain, fever, or edema.    Allergies  Allergen Reactions  . Crab [Shellfish Allergy] Anaphylaxis  . Iodine Anaphylaxis  . Albuterol-Ipratropium [Ipratropium-Albuterol]     Tremors, blurred vision.   Amy [name] [Umeclidinium Bromide] Other (See Comments)    Trembling, blurred vision  . Iohexol      Code: HIVES, Desc: pt states , swelling mouth , throat, and has difficulty breathing   . Lactose Intolerance (Gi)   . Adhesive [Tape] Rash  . Latex Itching and Rash    Immunization History  Administered Date(s) Administered  . Influenza, High Dose Seasonal PF 04/18/2016, 04/09/2018  . Influenza,inj,Quad PF,6+ Mos 08/26/2015  . Influenza-Unspecified 04/26/2017  . Pneumococcal Conjugate-13 08/26/2015    Past Medical History:  Diagnosis Date  . Allergy   . Anemia   . Anxiety     . Arthritis   . Blood dyscrasia    Sickle Cell traits  . Blood transfusion without reported diagnosis 1999   prbc S/P COLON SURGERY  . Breast cancer, right breast ([place]) 10/03/12  . Cancer ([place])    colon,renal,lung  . Cataract    NO SURGERY  . Chronic kidney disease 1999   RIGHT KIDNEY REMOVED  . Colon cancer ([place]) 1999, 2013  . Depression   . Diabetes mellitus    diet controlled  . GERD (gastroesophageal reflux disease)   . H/O: lung cancer   . Heart murmur   . Hernia    Near umbilicus  . History of colon cancer   . History of kidney cancer   . Hot flashes    5 yr pill for breast CA causing hot flashes  . HX: breast cancer 2014   --right breast  . Hypertension    Does not see a cardiologist  . Internal hemorrhoids   . Interstitial lung disease ([place]) 11/25/2015  . Lung cancer ([place]) 2001  . Neuromuscular disorder ([place])    PERIPHERAL NEUROPATHY FEET  . Shortness of breath    with exertion   . Sickle cell trait ([place])   . Tubular adenoma of colon   . Ulcer    HX YEARS AGO  Tobacco History: Social History   Tobacco Use  Smoking Status Former Smoker  . Packs/day: 1.25  . Years: 21.00  . Pack years: 26.25  . Types: Cigarettes  . Start date: 10/30/1992  . Last attempt to quit: 11/20/2013  . Years since quitting: 4.5  Smokeless Tobacco Never Used  Tobacco Comment   quit then started up again 2years ago--smokes 1pack every 2-3 days   Counseling given: Yes Comment: quit then started up again 2years ago--smokes 1pack every 2-3 days   Outpatient Encounter Medications as of 05/29/2018  Medication Sig  . acetaminophen (TYLENOL) 500 MG tablet Take 1,000 mg by mouth every 6 (six) hours as needed (pain).  . Albuterol Sulfate (PROAIR RESPICLICK) 540 (90 Base) MCG/ACT AEPB Inhale 2 puffs into the lungs every 8 (eight) hours as needed.  . ALPRAZolam (XANAX) 1 MG tablet Take 0.5 mg by mouth 2 (two) times daily.   . Ascorbic Acid (VITAMIN C WITH ROSE HIPS) 500 MG tablet Take  500 mg by mouth every morning.   Marland Kitchen atorvastatin (LIPITOR) 20 MG tablet Take 20 mg by mouth daily before breakfast.   . budesonide-formoterol (SYMBICORT) 160-4.5 MCG/ACT inhaler Inhale 2 puffs into the lungs 2 (two) times daily.  . Calcium Carbonate-Vitamin D (CALCIUM-VITAMIN D) 500-200 MG-UNIT per tablet Take 1 tablet by mouth 3 (three) times a week. Take 1 tablet by mouth Monday, Wednesday, and Friday  . Ferrous Gluconate (IRON) 240 (27 FE) MG TABS Take 1 tablet by mouth every morning.   . hydrochlorothiazide 25 MG tablet Take 25 mg by mouth daily before breakfast.   . losartan (COZAAR) 50 MG tablet Take 50 mg by mouth daily.  . metFORMIN (GLUCOPHAGE) 500 MG tablet Take 500 mg by mouth daily with breakfast.   . metoprolol (LOPRESSOR) 100 MG tablet Take 100 mg by mouth daily before breakfast.   . omeprazole (PRILOSEC) 20 MG capsule Take 20 mg by mouth every morning.   . OXYGEN Inhale into the lungs. Use 2L continuously  . PARoxetine (PAXIL) 40 MG tablet Take 40 mg by mouth daily before breakfast.   . Tiotropium Bromide Monohydrate (SPIRIVA RESPIMAT) 1.25 MCG/ACT AERS Inhale 2 puffs into the lungs daily.  . traMADol (ULTRAM) 50 MG tablet Take 50 mg by mouth 2 (two) times daily.   Marland Kitchen nystatin (MYCOSTATIN) 100000 UNIT/ML suspension Take 5 mLs (500,000 Units total) by mouth 4 (four) times daily.   No facility-administered encounter medications on file as of 05/29/2018.      Review of Systems  Review of Systems  Constitutional: Negative.  Negative for chills and fever.  HENT: Negative.   Respiratory: Positive for shortness of breath. Negative for cough.   Cardiovascular: Negative.  Negative for chest pain, palpitations and leg swelling.  Gastrointestinal: Negative.   Allergic/Immunologic: Negative.   Neurological: Negative.   Psychiatric/Behavioral: Negative.        Physical Exam  BP 122/74 (BP Location: Left Arm, Patient Position: Sitting, Cuff Size: Normal)   Pulse 81   Ht 5\' 5"   (1.651 m)   Wt 252 lb 8 oz (114.5 kg)   LMP 07/26/1983   SpO2 95% Comment: on 2L of O2  BMI 42.02 kg/m   Wt Readings from Last 5 Encounters:  05/29/18 252 lb 8 oz (114.5 kg)  05/10/18 250 lb (113.4 kg)  03/20/18 247 lb 14.4 oz (112.4 kg)  01/24/18 250 lb (113.4 kg)  11/16/17 250 lb 9.6 oz (113.7 kg)     Physical Exam  Constitutional: She is  oriented to person, place, and time. She appears well-developed and well-nourished. No distress.  Cardiovascular: Normal rate and regular rhythm.  Pulmonary/Chest: Effort normal and breath sounds normal. No respiratory distress. She has no wheezes.  Musculoskeletal: She exhibits no edema.  Neurological: She is alert and oriented to person, place, and time.  Psychiatric: She has a normal mood and affect.  Nursing note and vitals reviewed.     Assessment & Plan:   Chronic respiratory failure with hypoxia [facility]) Patient Instructions  When walked in office today patient's sats dropped to 82%, sats returned to 97% when placed on 3L [place] Patient is on O2 at home continuously Patient needs POC for ADL's - please order [employer] POC Will order nystatin swish and spit for thrush - use after each meal and at bedtime Follow up with Dr. Chase Caller as scheduled or sooner if needed        Fenton Foy, NP 05/30/2018

## 2018-05-30 ENCOUNTER — Encounter: Payer: Self-pay | Admitting: Nurse Practitioner

## 2018-05-30 ENCOUNTER — Telehealth: Payer: Self-pay | Admitting: Gastroenterology

## 2018-05-30 NOTE — Assessment & Plan Note (Signed)
Patient Instructions  When walked in office today patient's sats dropped to 82%, sats returned to 97% when placed on 3L [place] Patient is on O2 at home continuously Patient needs POC for ADL's - please order [employer] POC Will order nystatin swish and spit for thrush - use after each meal and at bedtime Follow up with Dr. Chase Caller as scheduled or sooner if needed

## 2018-05-30 NOTE — Telephone Encounter (Addendum)
Patient scheduled for 07/02/18 at 10:15am at [facility]. Case # [id number]

## 2018-05-31 NOTE — Telephone Encounter (Signed)
Left a message for patient to return my call. 

## 2018-06-04 ENCOUNTER — Other Ambulatory Visit: Payer: Self-pay

## 2018-06-04 DIAGNOSIS — Z85038 Personal history of other malignant neoplasm of large intestine: Secondary | ICD-10-CM

## 2018-06-04 DIAGNOSIS — Z9981 Dependence on supplemental oxygen: Secondary | ICD-10-CM

## 2018-06-04 NOTE — Telephone Encounter (Signed)
Informed patient of date and time of Colonoscopy. Mailed instructions to patient and informed patient to contact our office with any questions after receiving instructions. Patient verbalized understanding.

## 2018-06-15 ENCOUNTER — Telehealth: Payer: Self-pay | Admitting: Internal Medicine

## 2018-06-15 NOTE — Telephone Encounter (Signed)
lmtcb x1 for pt. 

## 2018-06-18 NOTE — Telephone Encounter (Signed)
Order has been added. Thanks.

## 2018-06-18 NOTE — Telephone Encounter (Signed)
Called and spoke with pt. Pt states she has not heard anything from [facility] in regards to her receiving a POC. Pt saw Lazaro Arms, NP 05/29/18 and a qualifying walk was done which showed pt did qualify for O2.  Assessment & Plan from pt's last OV with Lazaro Arms, NP 05/29/18:   Chronic respiratory failure with hypoxia [facility]) Patient Instructions  When walked in office today patient's sats dropped to 82%, sats returned to 97% when placed on 3L [place] Patient is on O2 at home continuously Patient needs POC for ADL's - please order [employer] POC   Looked at recent orders and it looks like an order was not placed for pt to be provided a POC from [facility].  Hinton Dyer, can you please update the snapshot with the information from pt's last OV with Lazaro Arms so we can also get an order placed for pt to be provided the POC. We will need to place an order so pt can be able to receive this.

## 2018-06-18 NOTE — Addendum Note (Signed)
Addended by: Nena Polio on: 06/18/2018 12:57 PM   Modules accepted: Orders

## 2018-06-18 NOTE — Addendum Note (Signed)
Addended by: Nena Polio on: 06/18/2018 02:28 PM   Modules accepted: Orders

## 2018-06-20 ENCOUNTER — Telehealth: Payer: Self-pay | Admitting: Nurse Practitioner

## 2018-06-20 ENCOUNTER — Encounter: Payer: Self-pay | Admitting: General Surgery

## 2018-06-20 NOTE — Telephone Encounter (Signed)
Called patient, unable to reach left message to give us a call back. 

## 2018-06-22 NOTE — Telephone Encounter (Signed)
An order was already placed for this patient. [employer] sent a fax that her insurance does not cover the machine at all. When I talked to her it was for her to contact her insurance to find out if they will cover a POC at all or if she has to pay out of patient regardless of what we have notated in the chart.  Also there is a telephone notation that Dr. Chase Caller put an order in on 11/22/169.

## 2018-06-22 NOTE — Telephone Encounter (Signed)
Called and spoke with Amy [name] to see if I could figure out the reason for Amy [name]'s call. Per Amy [name], she was told that her insurance was not going to accept the O2 that they stated that Amy [name] did not need it. Saw where Amy [name] was walked at her last OV with Tonya 05/29/18 with these instructions:  Patient Instructions  When walked in office today patient's sats dropped to 82%, sats returned to 97% when placed on 3L [place] Patient is on O2 at home continuously Patient needs POC for ADL's - please order [employer] POC   I checked the patient care coordination note section on the snap shot of Amy [name]'s chart and the information that I am seeing on there for documentation is from 01/26/17. This will need to be updated with the walk qualification information from Amy [name]'s OV with Tonya 05/29/18.  Hinton Dyer, can you please update this for [place] and also advise if you have more information on why Amy [name] said she could not receive the POC. Thanks!

## 2018-06-22 NOTE — Telephone Encounter (Signed)
Noted. Thank you. Called pt back to get clarification from pt in regards to why insurance turned down coverage for the POC.  Pt was told to have the claim resubmitted to see if they would accept it after being resubmitted.  PCCs, please advise if you can help [place] out with this or if there is something that needs to be done by Lazaro Arms or Hinton Dyer from pt's last OV with them. Thanks!

## 2018-06-25 NOTE — Telephone Encounter (Signed)
The DME company files the insurance.  [employer] only accepts certain insurances and pt has [employer]. For the insurances that they do not file they send a fax notifying us and letting us know pt will have to pay out of pocket.  I have left a vm for Jill Alexanders at [facility] to call me back to verify this is an insurance they don't file.

## 2018-06-26 NOTE — Telephone Encounter (Signed)
Spoke to Amy [name] at [facility].  Pt has [employer] Medicare Complete & they do not file this insurance.  I called the pt & explained this.  She will have to pay out of pocket if she wants poc with them.  She states she has been with [facility] for 3 years.  I told her she will be locked in with them for another 2 years.  I suggested she ask them if they have a poc that will last her longer than the 2 hours the one she has now will last her.  She states she will & something needs to be refiled with her insurance.  I told her [employer] for O2.  Nothing further needed at this time.

## 2018-06-28 ENCOUNTER — Telehealth: Payer: Self-pay | Admitting: Internal Medicine

## 2018-06-28 NOTE — Telephone Encounter (Signed)
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  Order was placed on 06/18/2018.    I explained to pt that this order was sent to [employer], the company that provides the portable concentrator.  Pt states that she received a letter from her insurance company stating that the order needs to go directly to her insurance company?  PCCs please advise if you know anything more about this process.  Thanks!

## 2018-06-29 NOTE — Telephone Encounter (Signed)
My understanding is that the DME will provide patient's insurance with anything they require for authorization.

## 2018-06-29 NOTE — Telephone Encounter (Signed)
ATC pt, no answer. Left message for pt to call back.  

## 2018-07-02 ENCOUNTER — Ambulatory Visit (HOSPITAL_COMMUNITY)
Admission: RE | Admit: 2018-07-02 | Discharge: 2018-07-02 | Disposition: A | Discharge status: Home / Self Care | Payer: Medicare Other | Source: Ambulatory Visit | Attending: Gastroenterology | Admitting: Gastroenterology

## 2018-07-02 ENCOUNTER — Ambulatory Visit (HOSPITAL_COMMUNITY): Payer: Medicare Other | Admitting: Certified Registered Nurse Anesthetist

## 2018-07-02 ENCOUNTER — Other Ambulatory Visit: Payer: Self-pay

## 2018-07-02 ENCOUNTER — Encounter (HOSPITAL_COMMUNITY): Payer: Self-pay | Admitting: Certified Registered Nurse Anesthetist

## 2018-07-02 ENCOUNTER — Encounter (HOSPITAL_COMMUNITY)
Admission: RE | Disposition: A | Discharge status: Home / Self Care | Payer: Self-pay | Source: Ambulatory Visit | Attending: Gastroenterology

## 2018-07-02 DIAGNOSIS — D123 Benign neoplasm of transverse colon: Secondary | ICD-10-CM

## 2018-07-02 DIAGNOSIS — Z9049 Acquired absence of other specified parts of digestive tract: Secondary | ICD-10-CM | POA: Insufficient documentation

## 2018-07-02 DIAGNOSIS — J449 Chronic obstructive pulmonary disease, unspecified: Secondary | ICD-10-CM | POA: Diagnosis not present

## 2018-07-02 DIAGNOSIS — Z7984 Long term (current) use of oral hypoglycemic drugs: Secondary | ICD-10-CM | POA: Insufficient documentation

## 2018-07-02 DIAGNOSIS — Z85038 Personal history of other malignant neoplasm of large intestine: Secondary | ICD-10-CM

## 2018-07-02 DIAGNOSIS — Z9981 Dependence on supplemental oxygen: Secondary | ICD-10-CM | POA: Diagnosis not present

## 2018-07-02 DIAGNOSIS — Z85118 Personal history of other malignant neoplasm of bronchus and lung: Secondary | ICD-10-CM | POA: Insufficient documentation

## 2018-07-02 DIAGNOSIS — Z902 Acquired absence of lung [part of]: Secondary | ICD-10-CM | POA: Insufficient documentation

## 2018-07-02 DIAGNOSIS — J9610 Chronic respiratory failure, unspecified whether with hypoxia or hypercapnia: Secondary | ICD-10-CM | POA: Diagnosis not present

## 2018-07-02 DIAGNOSIS — Z86010 Personal history of colonic polyps: Secondary | ICD-10-CM | POA: Insufficient documentation

## 2018-07-02 DIAGNOSIS — F329 Major depressive disorder, single episode, unspecified: Secondary | ICD-10-CM | POA: Insufficient documentation

## 2018-07-02 DIAGNOSIS — Z9221 Personal history of antineoplastic chemotherapy: Secondary | ICD-10-CM | POA: Insufficient documentation

## 2018-07-02 DIAGNOSIS — K219 Gastro-esophageal reflux disease without esophagitis: Secondary | ICD-10-CM | POA: Diagnosis not present

## 2018-07-02 DIAGNOSIS — D125 Benign neoplasm of sigmoid colon: Secondary | ICD-10-CM | POA: Diagnosis not present

## 2018-07-02 DIAGNOSIS — Z79899 Other long term (current) drug therapy: Secondary | ICD-10-CM | POA: Diagnosis not present

## 2018-07-02 DIAGNOSIS — Z85528 Personal history of other malignant neoplasm of kidney: Secondary | ICD-10-CM | POA: Insufficient documentation

## 2018-07-02 DIAGNOSIS — D573 Sickle-cell trait: Secondary | ICD-10-CM | POA: Diagnosis not present

## 2018-07-02 DIAGNOSIS — F419 Anxiety disorder, unspecified: Secondary | ICD-10-CM | POA: Diagnosis not present

## 2018-07-02 DIAGNOSIS — E1142 Type 2 diabetes mellitus with diabetic polyneuropathy: Secondary | ICD-10-CM | POA: Diagnosis not present

## 2018-07-02 DIAGNOSIS — Z79891 Long term (current) use of opiate analgesic: Secondary | ICD-10-CM | POA: Diagnosis not present

## 2018-07-02 DIAGNOSIS — Z980 Intestinal bypass and anastomosis status: Secondary | ICD-10-CM

## 2018-07-02 DIAGNOSIS — I10 Essential (primary) hypertension: Secondary | ICD-10-CM | POA: Diagnosis not present

## 2018-07-02 DIAGNOSIS — K640 First degree hemorrhoids: Secondary | ICD-10-CM | POA: Insufficient documentation

## 2018-07-02 DIAGNOSIS — Z6841 Body Mass Index (BMI) 40.0 and over, adult: Secondary | ICD-10-CM | POA: Insufficient documentation

## 2018-07-02 DIAGNOSIS — Z853 Personal history of malignant neoplasm of breast: Secondary | ICD-10-CM | POA: Insufficient documentation

## 2018-07-02 DIAGNOSIS — D649 Anemia, unspecified: Secondary | ICD-10-CM | POA: Diagnosis not present

## 2018-07-02 DIAGNOSIS — Z1211 Encounter for screening for malignant neoplasm of colon: Secondary | ICD-10-CM | POA: Diagnosis present

## 2018-07-02 DIAGNOSIS — F17210 Nicotine dependence, cigarettes, uncomplicated: Secondary | ICD-10-CM | POA: Insufficient documentation

## 2018-07-02 DIAGNOSIS — Z905 Acquired absence of kidney: Secondary | ICD-10-CM | POA: Insufficient documentation

## 2018-07-02 HISTORY — PX: COLONOSCOPY WITH PROPOFOL: SHX5780

## 2018-07-02 HISTORY — PX: BIOPSY: SHX5522

## 2018-07-02 HISTORY — PX: POLYPECTOMY: SHX5525

## 2018-07-02 SURGERY — COLONOSCOPY WITH PROPOFOL
Anesthesia: Monitor Anesthesia Care

## 2018-07-02 MED ORDER — LIDOCAINE 2% (20 MG/ML) 5 ML SYRINGE
INTRAMUSCULAR | Status: DC | PRN
  Administered 2018-07-02: 60 mg via INTRAVENOUS

## 2018-07-02 MED ORDER — PROPOFOL 10 MG/ML IV BOLUS
INTRAVENOUS | Status: AC
  Filled 2018-07-02: qty 20

## 2018-07-02 MED ORDER — PHENYLEPHRINE 40 MCG/ML (10ML) SYRINGE FOR IV PUSH (FOR BLOOD PRESSURE SUPPORT)
PREFILLED_SYRINGE | INTRAVENOUS | Status: DC | PRN
  Administered 2018-07-02 (×3): 80 ug via INTRAVENOUS
  Administered 2018-07-02: 40 ug via INTRAVENOUS
  Administered 2018-07-02: 80 ug via INTRAVENOUS

## 2018-07-02 MED ORDER — LACTATED RINGERS IV SOLN
INTRAVENOUS | Status: DC
  Administered 2018-07-02: 10:00:00 via INTRAVENOUS

## 2018-07-02 MED ORDER — PROPOFOL 10 MG/ML IV BOLUS
INTRAVENOUS | Status: DC | PRN
  Administered 2018-07-02: 30 mg via INTRAVENOUS

## 2018-07-02 MED ORDER — PROPOFOL 500 MG/50ML IV EMUL
INTRAVENOUS | Status: DC | PRN
  Administered 2018-07-02: 125 ug/kg/min via INTRAVENOUS

## 2018-07-02 MED ORDER — PROPOFOL 10 MG/ML IV BOLUS
INTRAVENOUS | Status: AC
  Filled 2018-07-02: qty 40

## 2018-07-02 MED ORDER — ONDANSETRON HCL 4 MG/2ML IJ SOLN: 4.0000 mg | Freq: Once | INTRAMUSCULAR | Status: DC | PRN

## 2018-07-02 MED ORDER — FENTANYL CITRATE (PF) 100 MCG/2ML IJ SOLN: 25.0000 ug | INTRAMUSCULAR | Status: DC | PRN

## 2018-07-02 SURGICAL SUPPLY — 21 items

## 2018-07-02 NOTE — Op Note (Signed)
[facility] Patient Name: Amy [name] Procedure Date: 07/02/2018 MRN: [id number] Attending MD: Ladene Artist , MD Date of Birth: 25-Apr-1946 CSN: [id number] Age: 72 Admit Type: Inpatient Procedure:                Colonoscopy Indications:              High risk colon cancer surveillance: Personal                            history of colon cancer Providers:                Pricilla Riffle. Fuller Plan, MD, Zenon Mayo, RN, Alan Mulder, Technician, Caryl Pina CRNA Referring MD:              Medicines:                Monitored Anesthesia Care Complications:            No immediate complications. Estimated blood loss:                            None. Estimated Blood Loss:     Estimated blood loss: none. Procedure:                Pre-Anesthesia Assessment:                           - Prior to the procedure, a History and Physical                            was performed, and patient medications and                            allergies were reviewed. The patient's tolerance of                            previous anesthesia was also reviewed. The risks                            and benefits of the procedure and the sedation                            options and risks were discussed with the patient.                            All questions were answered, and informed consent                            was obtained. Prior Anticoagulants: The patient has                            taken no previous anticoagulant or antiplatelet                            agents. ASA  Grade Assessment: III - A patient with                            severe systemic disease. After reviewing the risks                            and benefits, the patient was deemed in                            satisfactory condition to undergo the procedure.                           After obtaining informed consent, the colonoscope                            was passed under direct vision.  Throughout the                            procedure, the patient's blood pressure, pulse, and                            oxygen saturations were monitored continuously. The                            [id number]-[id number] ([id number]) Olympus peds colonscope was                            introduced through the anus and advanced to the the                            ileocolonic anastomosis. The terminal ileum and the                            rectum were photographed. The quality of the bowel                            preparation was good. The colonoscopy was performed                            without difficulty. The patient tolerated the                            procedure well. Scope In: 10:42:40 AM Scope Out: 37:10:62 AM Scope Withdrawal Time: 0 hours 20 minutes 46 seconds  Total Procedure Duration: 0 hours 23 minutes 33 seconds  Findings:      The perianal and digital rectal examinations were normal.      There was evidence of a prior end-to-side ileo-colonic anastomosis in       the transverse colon. This was patent and was characterized by healthy       appearing mucosa. The anastomosis was traversed.      The neo-terminal ileum appeared normal.      Three sessile polyps/nodules were found in a row in the transverse colon       at the anastomosis. The polyps  were 6 to 8 mm in size. These polyps were       removed with a hot snare. Resection and retrieval were complete.      A 12 mm polyp was found in the transverse colon. The polyp was sessile.       The polyp was removed with a hot snare. Resection and retrieval were       complete.      A 5 mm polyp was found in the sigmoid colon. The polyp was sessile. The       polyp was removed with a cold biopsy forceps. Resection and retrieval       were complete.      Internal hemorrhoids were found during retroflexion. The hemorrhoids       were small and Grade I (internal hemorrhoids that do not prolapse).      The exam was otherwise without  abnormality on direct and retroflexion       views. Impression:               - Patent end-to-side ileo-colonic anastomosis,                            characterized by healthy appearing mucosa.                           - The examined portion of the ileum was normal.                           - Three 6 to 8 mm polyps/nodules in the transverse                            colon at the anastomosis. Resected and retrieved.                           - One 12 mm polyp in the transverse colon, removed                            with a hot snare. Resected and retrieved.                           - One 5 mm polyp in the sigmoid colon, removed with                            a cold biopsy forceps. Resected and retrieved.                           - Internal hemorrhoids.                           - The examination was otherwise normal on direct                            and retroflexion views. Moderate Sedation:      Not Applicable - Patient had care per Anesthesia. Recommendation:           - Repeat colonoscopy in 1 - 2 years for  surveillance pending pathology review.                           - Patient has a contact number available for                            emergencies. The signs and symptoms of potential                            delayed complications were discussed with the                            patient. Return to normal activities tomorrow.                            Written discharge instructions were provided to the                            patient.                           - Resume previous diet.                           - Continue present medications.                           - Await pathology results.                           - No aspirin, ibuprofen, naproxen, or other                            non-steroidal anti-inflammatory drugs for 2 weeks                            after polyp removal. Procedure Code(s):        --- Professional ---                            [phone], Colonoscopy, flexible; with removal of                            tumor(s), polyp(s), or other lesion(s) by snare                            technique                           45380, 59, Colonoscopy, flexible; with biopsy,                            single or multiple Diagnosis Code(s):        --- Professional ---                           C16.606, Personal history of other malignant  neoplasm of large intestine                           Z98.0, Intestinal bypass and anastomosis status                           K64.0, First degree hemorrhoids                           D12.3, Benign neoplasm of transverse colon (hepatic                            flexure or splenic flexure)                           D12.5, Benign neoplasm of sigmoid colon CPT copyright 2018 American Medical Association. All rights reserved. The codes documented in this report are preliminary and upon coder review may  be revised to meet current compliance requirements. Ladene Artist, MD 07/02/2018 11:17:10 AM This report has been signed electronically. Number of Addenda: 0

## 2018-07-02 NOTE — Transfer of Care (Signed)
Immediate Anesthesia Transfer of Care Note  Patient: Amy [name]  Procedure(s) Performed: COLONOSCOPY WITH PROPOFOL (N/A ) BIOPSY  Patient Location: Endoscopy Unit  Anesthesia Type:MAC  Level of Consciousness: drowsy and responds to stimulation  Airway & Oxygen Therapy: Patient Spontanous Breathing and Patient connected to face mask oxygen  Post-op Assessment: Report given to RN, Post -op Vital signs reviewed and stable and Patient moving all extremities  Post vital signs: Reviewed and stable  Last Vitals:  Vitals Value Taken Time  BP    Temp    Pulse    Resp    SpO2      Last Pain:  Vitals:   07/02/18 0955  TempSrc: Oral  PainSc: 0-No pain         Complications: No apparent anesthesia complications

## 2018-07-02 NOTE — H&P (Signed)
History of Present Illness: This is a 72 year old female self referred for the evaluation of a personal history of colon cancer.  She underwent right hemicolectomy in 1999 for colon cancer.  She had a metachronous colon cancer in 2013 and underwent a partial colectomy.  She has chronic respiratory failure and she is now maintained on 2 L by nasal cannula continuously.  Her respiratory status is at its baseline.  She states she has intermittent diarrhea with certain foods such as cabbage, raw vegetables and milk products.  No diarrhea when she avoids these foods.  No other gastrointestinal complaints.  CBC, LFTs and CEA performed in August 2018 were unremarkable except for a slightly low platelet count.  Denies weight loss, abdominal pain, constipation, change in stool caliber, melena, hematochezia, nausea, vomiting, dysphagia, reflux symptoms, chest pain.        Allergies  Allergen Reactions  . Crab [Shellfish Allergy] Anaphylaxis  . Iodine Anaphylaxis  . Albuterol-Ipratropium [Ipratropium-Albuterol]     Tremors, blurred vision.   Keenan Bachelor Ellipta [Umeclidinium Bromide] Other (See Comments)    Trembling, blurred vision  . Iohexol      Code: HIVES, Desc: pt states , swelling mouth , throat, and has difficulty breathing   . Lactose Intolerance (Gi)   . Adhesive [Tape] Rash  . Latex Itching and Rash         Outpatient Medications Prior to Visit  Medication Sig Dispense Refill  . acetaminophen (TYLENOL) 500 MG tablet Take 1,000 mg by mouth every 6 (six) hours as needed (pain).    . Albuterol Sulfate (PROAIR RESPICLICK) 563 (90 Base) MCG/ACT AEPB Inhale 2 puffs into the lungs every 8 (eight) hours as needed. 1 each 11  . ALPRAZolam (XANAX) 1 MG tablet Take 0.5 mg by mouth 2 (two) times daily.     . Ascorbic Acid (VITAMIN C WITH ROSE HIPS) 500 MG tablet Take 500 mg by mouth every morning.     Marland Kitchen atorvastatin (LIPITOR) 20 MG tablet Take 20 mg by mouth daily before breakfast.      . budesonide-formoterol (SYMBICORT) 160-4.5 MCG/ACT inhaler Inhale 2 puffs into the lungs 2 (two) times daily. 10.2 g 11  . Calcium Carbonate-Vitamin D (CALCIUM-VITAMIN D) 500-200 MG-UNIT per tablet Take 1 tablet by mouth 3 (three) times a week. Take 1 tablet by mouth Monday, Wednesday, and Friday    . Ferrous Gluconate (IRON) 240 (27 FE) MG TABS Take 1 tablet by mouth every morning.     . hydrochlorothiazide 25 MG tablet Take 25 mg by mouth daily before breakfast.     . losartan (COZAAR) 50 MG tablet Take 50 mg by mouth daily.    . metFORMIN (GLUCOPHAGE) 500 MG tablet Take 500 mg by mouth daily with breakfast.     . metoprolol (LOPRESSOR) 100 MG tablet Take 100 mg by mouth daily before breakfast.     . omeprazole (PRILOSEC) 20 MG capsule Take 20 mg by mouth every morning.     . OXYGEN Inhale into the lungs. Use 2L continuously    . PARoxetine (PAXIL) 40 MG tablet Take 40 mg by mouth daily before breakfast.     . Tiotropium Bromide Monohydrate (SPIRIVA RESPIMAT) 1.25 MCG/ACT AERS Inhale 2 puffs into the lungs daily. 1 Inhaler 5  . traMADol (ULTRAM) 50 MG tablet Take 50 mg by mouth 2 (two) times daily.     Marland Kitchen nystatin (MYCOSTATIN/NYSTOP) powder Apply topically 3 (three) times daily. Apply to affected area for up to 7  days 30 g 2  . Tiotropium Bromide Monohydrate (SPIRIVA RESPIMAT) 1.25 MCG/ACT AERS Inhale 2 puffs into the lungs daily. 2 Inhaler 0   No facility-administered medications prior to visit.        Past Medical History:  Diagnosis Date  . Allergy   . Anemia   . Anxiety   . Arthritis   . Blood dyscrasia    Sickle Cell traits  . Blood transfusion without reported diagnosis 1999   prbc S/P COLON SURGERY  . Breast cancer, right breast ([place]) 10/03/12  . Cancer ([place])    colon,renal,lung  . Cataract    NO SURGERY  . Chronic kidney disease 1999   RIGHT KIDNEY REMOVED  . Colon cancer ([place]) 1999, 2013  . Depression   . Diabetes mellitus     diet controlled  . GERD (gastroesophageal reflux disease)   . H/O: lung cancer   . Heart murmur   . Hernia    Near umbilicus  . History of colon cancer   . History of kidney cancer   . Hot flashes    5 yr pill for breast CA causing hot flashes  . HX: breast cancer 2014   --right breast  . Hypertension    Does not see a cardiologist  . Internal hemorrhoids   . Interstitial lung disease ([place]) 11/25/2015  . Lung cancer ([place]) 2001  . Neuromuscular disorder ([place])    PERIPHERAL NEUROPATHY FEET  . Shortness of breath    with exertion   . Sickle cell trait ([place])   . Tubular adenoma of colon   . Ulcer    HX YEARS AGO        Past Surgical History:  Procedure Laterality Date  . ABDOMINAL HYSTERECTOMY  80's   fibroids  . BREAST BIOPSY     Bilateral cysts/benign  . BREAST BIOPSY Right 10/03/12   ER/PR +  . BREAST BIOPSY Right 12/20/2012   Procedure: Right breast Excisional Biopsy;  Surgeon: Rolm Bookbinder, MD;  Location: [facility];  Service: General;  Laterality: Right;  . BREAST LUMPECTOMY    . BREAST LUMPECTOMY WITH NEEDLE LOCALIZATION Right 12/20/2012   Procedure: BREAST LUMPECTOMY WITH NEEDLE LOCALIZATION;  Surgeon: Rolm Bookbinder, MD;  Location: [facility];  Service: General;  Laterality: Right;  . BREAST LUMPECTOMY WITH RADIOACTIVE SEED LOCALIZATION Right 01/19/2015   Procedure: RIGHT BREAST LUMPECTOMY WITH RADIOACTIVE SEED LOCALIZATION;  Surgeon: Rolm Bookbinder, MD;  Location: [facility];  Service: General;  Laterality: Right;  . CARDIAC CATHETERIZATION N/A 03/02/2016   Procedure: Right Heart Cath;  Surgeon: Jolaine Artist, MD;  Location: [facility];  Service: Cardiovascular;  Laterality: N/A;  . CATARACT EXTRACTION, BILATERAL  01/2018 &02/2018  . COLECTOMY  05-31-2012   laparoscopic ,possible open  . COLON SURGERY     chemo  . COLONOSCOPY    . [place]   right/chemo kidney removed for cancer  .  LUNG LOBECTOMY     right lower  . PARTIAL COLECTOMY  05/31/2012   Procedure: PARTIAL COLECTOMY;  Surgeon: Rolm Bookbinder, MD;  Location: [facility];  Service: General;  Laterality: N/A;  . PARTIAL HYSTERECTOMY    . POLYPECTOMY    . RE-EXCISION OF BREAST CANCER,SUPERIOR MARGINS Right 01/21/2013   Procedure: RE-EXCISION OF BREAST CANCER,SUPERIOR MARGINS;  Surgeon: Rolm Bookbinder, MD;  Location: [facility];  Service: General;  Laterality: Right;  . TONSILLECTOMY     as child  . TUBAL LIGATION     Social  History        Socioeconomic History  . Marital status: Widowed    Spouse name: Not on file  . Number of children: Not on file  . Years of education: Not on file  . Highest education level: Not on file  Occupational History  . Occupation: retired  Scientific laboratory technician  . Financial resource strain: Not on file  . Food insecurity:    Worry: Not on file    Inability: Not on file  . Transportation needs:    Medical: Not on file    Non-medical: Not on file  Tobacco Use  . Smoking status: Former Smoker    Packs/day: 1.25    Years: 21.00    Pack years: 26.25    Types: Cigarettes    Start date: 10/30/1992    Last attempt to quit: 11/20/2013    Years since quitting: 4.4  . Smokeless tobacco: Never Used  . Tobacco comment: quit then started up again 2years ago--smokes 1pack every 2-3 days  Substance and Sexual Activity  . Alcohol use: No    Alcohol/week: 0.0 standard drinks  . Drug use: No  . Sexual activity: Not Currently    Partners: Male    Birth control/protection: Surgical    Comment: TAH--ovaries retained  Lifestyle  . Physical activity:    Days per week: Not on file    Minutes per session: Not on file  . Stress: Not on file  Relationships  . Social connections:    Talks on phone: Not on file    Gets together: Not on file    Attends religious service: Not on file    Active member of club or organization: Not on file     Attends meetings of clubs or organizations: Not on file    Relationship status: Not on file  Other Topics Concern  . Not on file  Social History Narrative  . Not on file        Family History  Problem Relation Age of Onset  . Colon polyps Brother   . Multiple myeloma Brother 72  . Breast cancer Mother        dx in her 89s  . Heart disease Father   . Colon cancer Paternal Aunt   . Breast cancer Maternal Grandmother   . Colon cancer Cousin        2 paternal first cousins  . Leukemia Cousin 11  . Esophageal cancer Neg Hx   . Rectal cancer Neg Hx   . Stomach cancer Neg Hx        Review of Systems: Pertinent positive and negative review of systems were noted in the above HPI section. All other review of systems were otherwise negative.   Physical Exam: General: Well developed, well nourished, no acute distress Head: Normocephalic and atraumatic Eyes:  sclerae anicteric, EOMI Ears: Normal auditory acuity Mouth: No deformity or lesions Neck: Supple, no masses or thyromegaly Lungs: Clear throughout to auscultation Heart: Regular rate and rhythm; no murmurs, rubs or bruits Abdomen: Soft, non tender and non distended. No masses, hepatosplenomegaly or hernias noted. Normal Bowel sounds Rectal: Deferred to colonoscopy  Musculoskeletal: Symmetrical with no gross deformities  Skin: No lesions on visible extremities Pulses:  Normal pulses noted Extremities: No clubbing, cyanosis, edema or deformities noted Neurological: Alert oriented x 4, grossly nonfocal Cervical Nodes:  No significant cervical adenopathy Inguinal Nodes: No significant inguinal adenopathy Psychological:  Alert and cooperative. Normal mood and affect   Assessment and Recommendations:  1.  Personal history of metachronous colon cancer, overdue for surveillance colonoscopy.  Status post right hemicolectomy in 1999 and partial colectomy in 2013. Schedule colonoscopy at hospital due to  chronic respiratory failure and oxygen usage. The risks (including bleeding, perforation, infection, missed lesions, medication reactions and possible hospitalization or surgery if complications occur), benefits, and alternatives to colonoscopy with possible biopsy and possible polypectomy were discussed with the patient and they consent to proceed.  She is followed in oncology by Dr. Lindi Adie with her last visit in August 2019.  2.  Chronic respiratory failure maintained on 2 L nasal cannula O2.

## 2018-07-02 NOTE — Anesthesia Preprocedure Evaluation (Addendum)
Anesthesia Evaluation  Patient identified by MRN, date of birth, ID band Patient awake    Reviewed: Allergy & Precautions, NPO status , Patient's Chart, lab work & pertinent test results, reviewed documented beta blocker date and time   Airway Mallampati: III  TM Distance: >3 FB Neck ROM: Full    Dental  (+) Dental Advisory Given, Poor Dentition, Chipped, Missing, Partial Upper   Pulmonary shortness of breath, COPD,  COPD inhaler and oxygen dependent, former smoker,  Lung ca ILD   Pulmonary exam normal breath sounds clear to auscultation       Cardiovascular hypertension, Pt. on medications and Pt. on home beta blockers Normal cardiovascular exam Rhythm:Regular Rate:Normal     Neuro/Psych PSYCHIATRIC DISORDERS Anxiety Depression  Neuromuscular disease    GI/Hepatic Neg liver ROS, GERD  Medicated,Colon ca    Endo/Other  diabetes, Type 2, Oral Hypoglycemic AgentsMorbid obesity  Renal/GU Renal InsufficiencyRenal disease (RCC)     Musculoskeletal  (+) Arthritis ,   Abdominal   Peds  Hematology  (+) Blood dyscrasia, Sickle cell trait and anemia ,   Anesthesia Other Findings Day of surgery medications reviewed with the patient.  H/o Breast cancer  Reproductive/Obstetrics                            Anesthesia Physical Anesthesia Plan  ASA: III  Anesthesia Plan: MAC   Post-op Pain Management:    Induction: Intravenous  PONV Risk Score and Plan: 2 and Propofol infusion and Treatment may vary due to age or medical condition  Airway Management Planned: Nasal Cannula and Natural Airway  Additional Equipment:   Intra-op Plan:   Post-operative Plan:   Informed Consent: I have reviewed the patients History and Physical, chart, labs and discussed the procedure including the risks, benefits and alternatives for the proposed anesthesia with the patient or authorized representative who has  indicated his/her understanding and acceptance.   Dental advisory given  Plan Discussed with: CRNA and Anesthesiologist  Anesthesia Plan Comments:         Anesthesia Quick Evaluation

## 2018-07-02 NOTE — Discharge Instructions (Signed)

## 2018-07-02 NOTE — Telephone Encounter (Signed)
Spoke with patient explained what holly said she verbalized understanding nothing further needed at this time.

## 2018-07-03 NOTE — Anesthesia Postprocedure Evaluation (Signed)
Anesthesia Post Note  Patient: Amy [name]  Procedure(s) Performed: COLONOSCOPY WITH PROPOFOL (N/A ) BIOPSY     Patient location during evaluation: Endoscopy Anesthesia Type: MAC Level of consciousness: awake and alert, awake and oriented Pain management: pain level controlled Vital Signs Assessment: post-procedure vital signs reviewed and stable Respiratory status: spontaneous breathing, nonlabored ventilation and respiratory function stable Cardiovascular status: stable and blood pressure returned to baseline Postop Assessment: no apparent nausea or vomiting Anesthetic complications: no    Last Vitals:  Vitals:   07/02/18 1120 07/02/18 1130  BP: (!) 149/85 (!) 173/95  Pulse: 76 72  Resp: (!) 21 (!) 24  Temp:    SpO2: 100% 96%    Last Pain:  Vitals:   07/02/18 0955  TempSrc: Oral  PainSc: 0-No pain                 [name]

## 2018-07-05 ENCOUNTER — Encounter (HOSPITAL_COMMUNITY): Payer: Self-pay | Admitting: Gastroenterology

## 2018-07-11 ENCOUNTER — Telehealth: Payer: Self-pay | Admitting: Internal Medicine

## 2018-07-11 MED ORDER — TIOTROPIUM BROMIDE MONOHYDRATE 1.25 MCG/ACT IN AERS: 2.0000 | INHALATION_SPRAY | Freq: Every day | RESPIRATORY_TRACT | 0 refills | Status: DC

## 2018-07-11 NOTE — Telephone Encounter (Signed)
Called and spoke with Patient.  She stated that she spoke with [employer], and they told her they were waiting on authorization order from MR.  Order for [employer] was placed 06/18/18.      Will route to [facility] to assist and advise

## 2018-07-11 NOTE — Telephone Encounter (Signed)
Patient requesting samples of Spiriva 1.25. Samples placed in bag at front desk for pick up.Patient aware. Nothing further at this time.

## 2018-07-12 NOTE — Telephone Encounter (Signed)
Called and spoke to pt and relayed below message.  Pt states that per her insurance our office would need to summit a authorization for [employer].  Pt is aware that [employer] will need to fax our office required documentation (CMN). I have provided pt with [employer]'s contact number that I obtained from 06/20/18 documentation encounter.  Pt stated that she would reach out to [employer] today.

## 2018-07-12 NOTE — Telephone Encounter (Signed)
I don't have a form for this patient

## 2018-07-13 ENCOUNTER — Encounter: Payer: Self-pay | Admitting: Gastroenterology

## 2018-07-13 NOTE — Telephone Encounter (Signed)
Form has been signed by Lazaro Arms and has been faxed

## 2018-07-13 NOTE — Telephone Encounter (Signed)
This form has been received and given to Lazaro Arms to sign and fax back per note from [employer] the patient wants to buy her POC. Waiting for Tonya to sign form

## 2018-09-25 ENCOUNTER — Telehealth: Payer: Self-pay | Admitting: Internal Medicine

## 2018-09-25 MED ORDER — TIOTROPIUM BROMIDE MONOHYDRATE 1.25 MCG/ACT IN AERS: 2.0000 | INHALATION_SPRAY | Freq: Every day | RESPIRATORY_TRACT | 0 refills | Status: DC

## 2018-09-25 MED ORDER — BUDESONIDE-FORMOTEROL FUMARATE 160-4.5 MCG/ACT IN AERO: 2.0000 | INHALATION_SPRAY | Freq: Two times a day (BID) | RESPIRATORY_TRACT | 0 refills | Status: DC

## 2018-09-25 NOTE — Telephone Encounter (Signed)
Called and spoke with Patient.  She requested symbicort 160, and spiriva 1.25 samples.  Samples placed at front desk for pick up.  Patient stated understanding.  Nothing further at this time.  OV 11/16/17, with Dr Chase Caller Plan Continue o2, spiriva, and symbicort - take samples of each Albuterol as needed

## 2018-12-03 ENCOUNTER — Telehealth: Payer: Self-pay | Admitting: Internal Medicine

## 2018-12-03 MED ORDER — NYSTATIN 100000 UNIT/ML MT SUSP: 5.0000 mL | Freq: Four times a day (QID) | OROMUCOSAL | 0 refills | Status: DC

## 2018-12-03 MED ORDER — BUDESONIDE-FORMOTEROL FUMARATE 160-4.5 MCG/ACT IN AERO: 2.0000 | INHALATION_SPRAY | Freq: Two times a day (BID) | RESPIRATORY_TRACT | 0 refills | Status: DC

## 2018-12-03 NOTE — Telephone Encounter (Signed)
Called and spoke with pt who states she has thrush on tongue and gums and due to this, pt would like to have something called in. Pt is also requesting to have samples of Symbicort.  Tonya, please advise on this for pt. Thanks!

## 2018-12-03 NOTE — Telephone Encounter (Signed)
I ordered nystatin suspension for patient. She can have Symbicort samples. Thanks.

## 2018-12-03 NOTE — Addendum Note (Signed)
Addended by: Lorretta Harp on: 12/03/2018 04:59 PM   Modules accepted: Orders

## 2018-12-03 NOTE — Telephone Encounter (Signed)
Called and spoke with pt letting her know that we have sent in Rx nystatin to pharmacy for her and also that we were going to give her samples of symbicort. Pt expressed understanding. Told pt that they would be up front for her tomorrow when she came to pick samples up. Nothing further needed.

## 2019-01-30 ENCOUNTER — Ambulatory Visit: Payer: Medicare Other | Admitting: Obstetrics and Gynecology

## 2019-02-14 ENCOUNTER — Other Ambulatory Visit: Payer: Self-pay | Admitting: Internal Medicine

## 2019-02-14 DIAGNOSIS — Z1231 Encounter for screening mammogram for malignant neoplasm of breast: Secondary | ICD-10-CM

## 2019-02-26 ENCOUNTER — Telehealth: Payer: Self-pay | Admitting: Internal Medicine

## 2019-02-26 NOTE — Telephone Encounter (Signed)
ATC pt, no answer. Left message for pt to call back.  

## 2019-02-27 NOTE — Telephone Encounter (Signed)
Attempted to call pt but unable to reach. Left message for pt to return call. 

## 2019-02-27 NOTE — Telephone Encounter (Signed)
PT RETURNING CALL FROM YESTERDAY FOR SAMPLES SHE CAN BE REACHED @ [phone].Amy [name]

## 2019-02-27 NOTE — Telephone Encounter (Signed)
We do not have samples of Symbicort at this time. Pt is aware. Nothing further is needed.

## 2019-02-28 ENCOUNTER — Telehealth: Payer: Self-pay | Admitting: Internal Medicine

## 2019-02-28 NOTE — Telephone Encounter (Signed)
Called spoke with patient regarding her issue for needing samples. Patient states she did not qualify for assistance for the symbicort but she did for the spiriva. Patient also has not been seen in almost a year. Patient scheduled to come for visit on 03/05/19 she will want samples of symbicort 160. Patient was instructed to help manage her medication and what insurance will pay for; to bring in a drug formulary she can obtain from her insurance company to help the provider know which medications are covered.   Nothing further needed at this time.

## 2019-03-05 ENCOUNTER — Ambulatory Visit (INDEPENDENT_AMBULATORY_CARE_PROVIDER_SITE_OTHER): Payer: Medicare Other | Admitting: Primary Care

## 2019-03-05 ENCOUNTER — Encounter: Payer: Self-pay | Admitting: Primary Care

## 2019-03-05 ENCOUNTER — Other Ambulatory Visit: Payer: Self-pay

## 2019-03-05 DIAGNOSIS — R0602 Shortness of breath: Secondary | ICD-10-CM

## 2019-03-05 MED ORDER — MONTELUKAST SODIUM 10 MG PO TABS: 10.0000 mg | ORAL_TABLET | Freq: Every day | ORAL | 6 refills | Status: DC

## 2019-03-05 MED ORDER — PREDNISONE 10 MG PO TABS: ORAL_TABLET | ORAL | 0 refills | Status: DC

## 2019-03-05 NOTE — Patient Instructions (Addendum)
Covid testing re: sob, possible exposure   Recommendations: Prednisone 20mg  x 5 days Start taking Singulair 10mg  at bedtime  Continue Symbicort 1 puff twice daily  Follow-up: 2 weeks with Eustaquio Maize, NP (in office visit ok if covid negative)

## 2019-03-05 NOTE — Progress Notes (Signed)
Virtual Visit via Telephone Note  I connected with Amy [name] on 03/05/19 at  4:00 PM EDT by telephone and verified that I am speaking with the correct person using two identifiers.  Location: Patient: Home Provider: Office   I discussed the limitations, risks, security and privacy concerns of performing an evaluation and management service by telephone and the availability of in person appointments. I also discussed with the patient that there may be a patient responsible charge related to this service. The patient expressed understanding and agreed to proceed.   History of Present Illness: 73 year old female, former smoker. PMH significant for ILD, non-small cell cancer right lung, COPD/emphysema, chronic respiratory failure with hypoxia, pulmonary hypertension, renal cell cancer, DM2, breast cancer. Patient of Dr. Chase Caller, last seen by pulmonary NP on 05/29/18. High-resolution CT chest June 2018 without interstitial lung disease and micro nodules are stable not requiring for the follow-up. Maintained on Symbicort, spiriva, prn albuterol and chronic oxygen.   03/05/2019 Patient contacted today for televisit for medication management. She is doing ok, acute complaints of increased shortness of breath and wheezing since she went to [facility] on Friday 8/7. States that she seldomly requires Albuterol rescue inhaler but when she does use it she notices an improvement. Weather affects her breathing. Associated post nasal drip. Takes benadryl which clears it up. Experiences voice hoarseness in the afternoon or if she talks for a long period. Denies fever, chills, cough. She has been compliant with mask wearing and practicing social distancing.     Observations/Objective:  - Mild wheezing noted during phone conversation  Assessment and Plan:  COPD exacerbation - Increased sob/wheezing  - Needs RX prednisone 20mg  x 5 days  - Continue Symbicort 1 puff twice daily (hx thrush symptoms) - Covid testing  recommended d/t possible exposure   Allergic rhinitis/PND - Start Singulair 10mg  at bedtime  - Advised against chronic benadryl use d/t age  Follow Up Instructions:   - 2 weeks with Eustaquio Maize, NP  I discussed the assessment and treatment plan with the patient. The patient was provided an opportunity to ask questions and all were answered. The patient agreed with the plan and demonstrated an understanding of the instructions.   The patient was advised to call back or seek an in-person evaluation if the symptoms worsen or if the condition fails to improve as anticipated.  I provided 25 minutes of non-face-to-face time during this encounter.   Martyn Ehrich, NP

## 2019-03-06 ENCOUNTER — Other Ambulatory Visit: Payer: Self-pay | Admitting: *Deleted

## 2019-03-06 ENCOUNTER — Encounter: Payer: Self-pay | Admitting: Primary Care

## 2019-03-06 DIAGNOSIS — Z20822 Contact with and (suspected) exposure to covid-19: Secondary | ICD-10-CM

## 2019-03-07 LAB — NOVEL CORONAVIRUS, NAA: SARS-CoV-2, NAA: NOT DETECTED

## 2019-03-22 ENCOUNTER — Inpatient Hospital Stay: Payer: Medicare Other | Admitting: Adult Health

## 2019-03-29 ENCOUNTER — Other Ambulatory Visit: Payer: Self-pay

## 2019-04-02 ENCOUNTER — Ambulatory Visit: Payer: Medicare Other

## 2019-04-03 ENCOUNTER — Ambulatory Visit: Payer: Medicare Other | Admitting: Obstetrics and Gynecology

## 2019-04-25 ENCOUNTER — Telehealth: Payer: Self-pay | Admitting: Primary Care

## 2019-04-25 MED ORDER — SPIRIVA RESPIMAT 1.25 MCG/ACT IN AERS: 2.0000 | INHALATION_SPRAY | Freq: Every day | RESPIRATORY_TRACT | 5 refills | Status: DC

## 2019-04-25 MED ORDER — BUDESONIDE-FORMOTEROL FUMARATE 160-4.5 MCG/ACT IN AERO: 2.0000 | INHALATION_SPRAY | Freq: Two times a day (BID) | RESPIRATORY_TRACT | 0 refills | Status: DC

## 2019-04-25 NOTE — Telephone Encounter (Signed)
LMTCB.   Spiriva refill sent.   Sample has been placed upfront.

## 2019-04-29 NOTE — Telephone Encounter (Signed)
Left detailed msg that the sample up front and rx sent in

## 2019-05-01 ENCOUNTER — Telehealth: Payer: Self-pay | Admitting: Internal Medicine

## 2019-05-01 DIAGNOSIS — R0602 Shortness of breath: Secondary | ICD-10-CM

## 2019-05-01 DIAGNOSIS — J438 Other emphysema: Secondary | ICD-10-CM

## 2019-05-01 DIAGNOSIS — J9611 Chronic respiratory failure with hypoxia: Secondary | ICD-10-CM

## 2019-05-01 NOTE — Telephone Encounter (Signed)
Call returned to patient, requesting a PA for symbicort for cheaper price.   [facility] Key: [id number] - PA Case ID: [id number].   PA initiated will hold in triage to f/u.

## 2019-05-02 NOTE — Telephone Encounter (Signed)
Pt needs samples of  Symbicort and wants to know if a letter was sent about her medication

## 2019-05-02 NOTE — Telephone Encounter (Signed)
Message routed to Dr. Chase Caller as an fyi and for response.  I called the patient to make her aware of response received from [facility]. Patient stated she has not received any Symbicort from [facility] and she ran out of medication on Sunday (the Spiriva was sent to [facility]).  Advised the patient a sample would be placed up front for pick up, but that she needs to contact her insurance company to get a copy of the tier list sent to her so she can review it with Dr. Chase Caller at her next visit with him.  I advised the patient that according to [facility] a prior authorization was not needed for Symbicort or Spiriva.  Patient stated the medication costs her $45 out of pocket and she does not have the funds to pay for the prescription at this time.  Sample of Symbicort placed up front for pick up.  Dr. Chase Caller, according to the last telephone visit dated 03/05/19, the patient was to follow up in 2 weeks. The patient has not had an in office visit since 05/29/2018 with Lazaro Arms, NP. This patient was last seen by you 11/16/17.  Based on this, do you want this patient scheduled to see you for a 30 minute appointment or would 15 minutes be appropriate? Please advise, thank you.

## 2019-05-02 NOTE — Telephone Encounter (Signed)
According to medication list, the patient was issued a sample of Symbicort 04/25/19. Prescription for Sprivia with 5 refills was issued same day and sent to [facility].  Called [facility] and advised that the Symbicort and Spiriva did not require a prior authorization.

## 2019-05-02 NOTE — Telephone Encounter (Signed)
Amy [name] (Key: [place])  This request has received a N/A outcome.  Please note any additional information provided by [facility] at the bottom of this request.

## 2019-05-13 ENCOUNTER — Telehealth: Payer: Self-pay | Admitting: Internal Medicine

## 2019-05-13 NOTE — Telephone Encounter (Signed)
Left message for patient to call back  

## 2019-05-14 ENCOUNTER — Ambulatory Visit: Payer: Medicare Other

## 2019-05-16 NOTE — Telephone Encounter (Signed)
Attempted to call patient, no answer, left message to call back.  

## 2019-05-17 ENCOUNTER — Other Ambulatory Visit: Payer: Self-pay

## 2019-05-17 NOTE — Telephone Encounter (Signed)
ATC Patient.  LMTCB when available.  

## 2019-05-20 ENCOUNTER — Telehealth: Payer: Self-pay | Admitting: Internal Medicine

## 2019-05-20 MED ORDER — BUDESONIDE-FORMOTEROL FUMARATE 160-4.5 MCG/ACT IN AERO: 2.0000 | INHALATION_SPRAY | Freq: Two times a day (BID) | RESPIRATORY_TRACT | 5 refills | Status: DC

## 2019-05-20 NOTE — Telephone Encounter (Signed)
Spoke with pt. She is needing a refill on Symbicort. Rx has been sent in. Nothing further was needed.

## 2019-05-20 NOTE — Telephone Encounter (Signed)
Called and spoke to patient let her know per the 05/01/2019 telephone note that a PA is not needed for Symbicort.  Patient stated her inhaler is $47 and that is more than she can afford.  Advised patient to check with her insurance company of what her formulary is and to let us know.

## 2019-05-20 NOTE — Telephone Encounter (Signed)
ATC pt, no answer. Left message for pt to call back.  Will close due to multiple attempts to reach pt, per triage protocal.

## 2019-05-21 ENCOUNTER — Ambulatory Visit: Payer: Medicare Other | Admitting: Obstetrics and Gynecology

## 2019-05-21 NOTE — Progress Notes (Deleted)
73 y.o. G70P1010 Widowed [place] American female here for annual exam.    PCP:     Patient's last menstrual period was 07/26/1983.           Sexually active: {yes no:314532}  The current method of family planning is status post hysterectomy--ovaries remain.    Exercising: {yes no:314532}  {types:19826} Smoker:  no  Health Maintenance: Pap:  06/14/12 wnl History of abnormal Pap:  no MMG: 03-23-09 Neg/density C/Birads1-appt. 07-03-19 Colonoscopy: 07-02-18 polyps;next due 06/2020 BMD: ***04-01-16  Result:Normal TDaP:  *** Gardasil:   n/a MBT:DHRCB Hep C:never Screening Labs:  Hb today: ***, Urine today: ***   reports that she quit smoking about 5 years ago. Her smoking use included cigarettes. She started smoking about 26 years ago. She has a 26.25 pack-year smoking history. She has never used smokeless tobacco. She reports that she does not drink alcohol or use drugs.  Past Medical History:  Diagnosis Date  . Allergy   . Anemia   . Anxiety   . Arthritis   . Blood dyscrasia    Sickle Cell traits  . Blood transfusion without reported diagnosis 1999   prbc S/P COLON SURGERY  . Breast cancer, right breast ([place]) 10/03/12  . Cancer ([place])    colon,renal,lung  . Cataract    NO SURGERY  . Chronic kidney disease 1999   RIGHT KIDNEY REMOVED  . Colon cancer ([place]) 1999, 2013  . Depression   . Diabetes mellitus    diet controlled  . GERD (gastroesophageal reflux disease)   . H/O: lung cancer   . Heart murmur   . Hernia    Near umbilicus  . History of colon cancer   . History of kidney cancer   . Hot flashes    5 yr pill for breast CA causing hot flashes  . HX: breast cancer 2014   --right breast  . Hypertension    Does not see a cardiologist  . Internal hemorrhoids   . Interstitial lung disease ([place]) 11/25/2015  . Lung cancer ([place]) 2001  . Neuromuscular disorder ([place])    PERIPHERAL NEUROPATHY FEET  . Shortness of breath    with exertion   . Sickle cell trait ([place])   .  Tubular adenoma of colon   . Ulcer    HX YEARS AGO    Past Surgical History:  Procedure Laterality Date  . ABDOMINAL HYSTERECTOMY  80's   fibroids  . BIOPSY  07/02/2018   Procedure: BIOPSY;  Surgeon: Ladene Artist, MD;  [facility];  Service: Endoscopy;;  . BREAST BIOPSY     Bilateral cysts/benign  . BREAST BIOPSY Right 10/03/12   ER/PR +  . BREAST BIOPSY Right 12/20/2012   Procedure: Right breast Excisional Biopsy;  Surgeon: Rolm Bookbinder, MD;  Location: [facility];  Service: General;  Laterality: Right;  . BREAST LUMPECTOMY    . BREAST LUMPECTOMY WITH NEEDLE LOCALIZATION Right 12/20/2012   Procedure: BREAST LUMPECTOMY WITH NEEDLE LOCALIZATION;  Surgeon: Rolm Bookbinder, MD;  Location: [facility];  Service: General;  Laterality: Right;  . BREAST LUMPECTOMY WITH RADIOACTIVE SEED LOCALIZATION Right 01/19/2015   Procedure: RIGHT BREAST LUMPECTOMY WITH RADIOACTIVE SEED LOCALIZATION;  Surgeon: Rolm Bookbinder, MD;  Location: [facility];  Service: General;  Laterality: Right;  . CARDIAC CATHETERIZATION N/A 03/02/2016   Procedure: Right Heart Cath;  Surgeon: Jolaine Artist, MD;  Location: [facility];  Service: Cardiovascular;  Laterality: N/A;  . CATARACT EXTRACTION, BILATERAL  01/2018 &02/2018  .  COLECTOMY  05-31-2012   laparoscopic ,possible open  . COLON SURGERY     chemo  . COLONOSCOPY    . COLONOSCOPY WITH PROPOFOL N/A 07/02/2018   Procedure: COLONOSCOPY WITH PROPOFOL;  Surgeon: Ladene Artist, MD;  [facility];  Service: Endoscopy;  Laterality: N/A;  . [place]   right/chemo kidney removed for cancer  . LUNG LOBECTOMY     right lower  . PARTIAL COLECTOMY  05/31/2012   Procedure: PARTIAL COLECTOMY;  Surgeon: Rolm Bookbinder, MD;  [facility];  Service: General;  Laterality: N/A;  . PARTIAL HYSTERECTOMY    . POLYPECTOMY    . POLYPECTOMY  07/02/2018   Procedure: POLYPECTOMY;  Surgeon: Ladene Artist, MD;  [facility];  Service: Endoscopy;;  . RE-EXCISION OF BREAST CANCER,SUPERIOR MARGINS Right 01/21/2013   Procedure: RE-EXCISION OF BREAST CANCER,SUPERIOR MARGINS;  Surgeon: Rolm Bookbinder, MD;  [facility];  Service: General;  Laterality: Right;  . TONSILLECTOMY     as child  . TUBAL LIGATION      Current Outpatient Medications  Medication Sig Dispense Refill  . acetaminophen (TYLENOL) 500 MG tablet Take 1,000 mg by mouth every 6 (six) hours as needed (pain).    . Albuterol Sulfate (PROAIR RESPICLICK) 654 (90 Base) MCG/ACT AEPB Inhale 2 puffs into the lungs every 8 (eight) hours as needed. (Patient taking differently: Inhale 2 puffs into the lungs every 8 (eight) hours as needed (wheezing/shortness of breath). ) 1 each 11  . ALPRAZolam (XANAX) 1 MG tablet Take 0.5 mg by mouth at bedtime.     . Ascorbic Acid (VITAMIN C WITH ROSE HIPS) 500 MG tablet Take 500 mg by mouth daily.     Marland Kitchen atorvastatin (LIPITOR) 20 MG tablet Take 20 mg by mouth daily.     . budesonide-formoterol (SYMBICORT) 160-4.5 MCG/ACT inhaler Inhale 2 puffs into the lungs 2 (two) times daily. 1 Inhaler 5  . Calcium Carbonate-Vitamin D (CALCIUM-VITAMIN D) 500-200 MG-UNIT per tablet Take 1 tablet by mouth every Monday, Wednesday, and Friday.     . Ferrous Gluconate (IRON) 240 (27 FE) MG TABS Take 1 tablet by mouth every morning.     . hydrochlorothiazide 25 MG tablet Take 25 mg by mouth daily before breakfast.     . losartan (COZAAR) 50 MG tablet Take 50 mg by mouth daily.    . metFORMIN (GLUCOPHAGE) 500 MG tablet Take 500 mg by mouth daily with breakfast.     . metoprolol (LOPRESSOR) 100 MG tablet Take 100 mg by mouth daily before breakfast.     . montelukast (SINGULAIR) 10 MG tablet Take 1 tablet (10 mg total) by mouth at bedtime. 30 tablet 6  . nystatin (MYCOSTATIN) 100000 UNIT/ML suspension Take 5 mLs (500,000 Units total) by mouth 4 (four) times daily. 60 mL 0  . omeprazole (PRILOSEC) 20 MG capsule Take 20 mg by mouth  daily before breakfast.     . OXYGEN Inhale into the lungs. Use 2L continuously    . PARoxetine (PAXIL) 40 MG tablet Take 40 mg by mouth daily before breakfast.     . predniSONE (DELTASONE) 10 MG tablet Take 2 tabs x 5 days 10 tablet 0  . Tiotropium Bromide Monohydrate (SPIRIVA RESPIMAT) 1.25 MCG/ACT AERS Inhale 2 puffs into the lungs daily. 4 g 5  . traMADol (ULTRAM) 50 MG tablet Take 50 mg by mouth every 12 (twelve) hours as needed (pain).      No current facility-administered medications for  this visit.     Family History  Problem Relation Age of Onset  . Colon polyps Brother   . Multiple myeloma Brother 49  . Breast cancer Mother        dx in her 85s  . Heart disease Father   . Colon cancer Paternal Aunt   . Breast cancer Maternal Grandmother   . Colon cancer Cousin        2 paternal first cousins  . Leukemia Cousin 11  . Esophageal cancer Neg Hx   . Rectal cancer Neg Hx   . Stomach cancer Neg Hx     Review of Systems  Exam:   LMP 07/26/1983     General appearance: alert, cooperative and appears stated age Head: normocephalic, without obvious abnormality, atraumatic Neck: no adenopathy, supple, symmetrical, trachea midline and thyroid normal to inspection and palpation Lungs: clear to auscultation bilaterally Breasts: normal appearance, no masses or tenderness, No nipple retraction or dimpling, No nipple discharge or bleeding, No axillary adenopathy Heart: regular rate and rhythm Abdomen: soft, non-tender; no masses, no organomegaly Extremities: extremities normal, atraumatic, no cyanosis or edema Skin: skin color, texture, turgor normal. No rashes or lesions Lymph nodes: cervical, supraclavicular, and axillary nodes normal. Neurologic: grossly normal  Pelvic: External genitalia:  no lesions              No abnormal inguinal nodes palpated.              Urethra:  normal appearing urethra with no masses, tenderness or lesions              Bartholins and Skenes: normal                  Vagina: normal appearing vagina with normal color and discharge, no lesions              Cervix: no lesions              Pap taken: {yes no:314532} Bimanual Exam:  Uterus:  normal size, contour, position, consistency, mobility, non-tender              Adnexa: no mass, fullness, tenderness              Rectal exam: {yes no:314532}.  Confirms.              Anus:  normal sphincter tone, no lesions  Chaperone was present for exam.  Assessment:   Well woman visit with normal exam.   Plan: Mammogram screening discussed. Self breast awareness reviewed. Pap and HR HPV as above. Guidelines for Calcium, Vitamin D, regular exercise program including cardiovascular and weight bearing exercise.   Follow up annually and prn.   Additional counseling given.  {yes [id number]. _______ minutes face to face time of which over 50% was spent in counseling.    After visit summary provided.

## 2019-05-27 ENCOUNTER — Telehealth: Payer: Self-pay | Admitting: Adult Health

## 2019-05-27 NOTE — Telephone Encounter (Signed)
Returned patient's phone call regarding rescheduling an appointment, left a voicemail. 

## 2019-05-27 NOTE — Progress Notes (Deleted)
CLINIC:  Survivorship   REASON FOR VISIT:  Routine follow-up for history of breast cancer.   BRIEF ONCOLOGIC HISTORY:  Oncology History  Non-small cell cancer of right lung ([place])  06/02/2000 Surgery   T1 N0 non-small cell lung cancer removed from the right lung with a VATS procedure by Dr. 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  Overall, she reports feeling quite well. ***    REVIEW OF SYSTEMS:  Review of Systems - Oncology Breast: Denies any new nodularity, masses, tenderness, nipple changes, or nipple discharge.       PAST MEDICAL/SURGICAL HISTORY:  Past Medical History:  Diagnosis Date  . Allergy   . Anemia   . Anxiety   . Arthritis   . Blood dyscrasia    Sickle Cell traits  . Blood transfusion without reported diagnosis 1999   prbc S/P COLON SURGERY  . Breast cancer, right breast ([place]) 10/03/12  . Cancer ([place])    colon,renal,lung  . Cataract    NO SURGERY  . Chronic kidney disease 1999   RIGHT KIDNEY REMOVED  . Colon cancer ([place]) 1999, 2013  . Depression   . Diabetes mellitus    diet controlled  . GERD (gastroesophageal reflux disease)   . H/O: lung cancer   . Heart murmur   . Hernia    Near umbilicus  . History of colon cancer   . History of kidney cancer   . Hot flashes    5 yr  pill for breast CA causing hot flashes  . HX: breast cancer 2014   --right breast  . Hypertension    Does not see a cardiologist  . Internal hemorrhoids   . Interstitial lung disease ([place]) 11/25/2015  . Lung cancer ([place]) 2001  . Neuromuscular disorder ([place])    PERIPHERAL NEUROPATHY FEET  . Shortness of breath    with exertion   . Sickle cell trait ([place])   . Tubular adenoma of colon   . Ulcer    HX YEARS AGO   Past Surgical History:  Procedure Laterality Date  . ABDOMINAL HYSTERECTOMY  80's   fibroids  . BIOPSY  07/02/2018   Procedure: BIOPSY;  Surgeon: Ladene Artist, MD;  [facility];  Service: Endoscopy;;  . BREAST BIOPSY     Bilateral cysts/benign  . BREAST BIOPSY Right 10/03/12   ER/PR +  . BREAST BIOPSY Right 12/20/2012   Procedure: Right breast Excisional Biopsy;  Surgeon: Rolm Bookbinder, MD;  Location: [facility];  Service: General;  Laterality: Right;  . BREAST LUMPECTOMY    . BREAST LUMPECTOMY WITH NEEDLE LOCALIZATION Right 12/20/2012   Procedure: BREAST LUMPECTOMY WITH NEEDLE LOCALIZATION;  Surgeon: Rolm Bookbinder, MD;  Location: [facility];  Service: General;  Laterality: Right;  . BREAST LUMPECTOMY WITH RADIOACTIVE SEED LOCALIZATION Right 01/19/2015  Procedure: RIGHT BREAST LUMPECTOMY WITH RADIOACTIVE SEED LOCALIZATION;  Surgeon: Rolm Bookbinder, MD;  Location: [facility];  Service: General;  Laterality: Right;  . CARDIAC CATHETERIZATION N/A 03/02/2016   Procedure: Right Heart Cath;  Surgeon: Jolaine Artist, MD;  Location: [facility];  Service: Cardiovascular;  Laterality: N/A;  . CATARACT EXTRACTION, BILATERAL  01/2018 &02/2018  . COLECTOMY  05-31-2012   laparoscopic ,possible open  . COLON SURGERY     chemo  . COLONOSCOPY    . COLONOSCOPY WITH PROPOFOL N/A 07/02/2018   Procedure: COLONOSCOPY WITH PROPOFOL;  Surgeon: Ladene Artist, MD;  [facility];  Service: Endoscopy;  Laterality: N/A;  . [place]    right/chemo kidney removed for cancer  . LUNG LOBECTOMY     right lower  . PARTIAL COLECTOMY  05/31/2012   Procedure: PARTIAL COLECTOMY;  Surgeon: Rolm Bookbinder, MD;  [facility];  Service: General;  Laterality: N/A;  . PARTIAL HYSTERECTOMY    . POLYPECTOMY    . POLYPECTOMY  07/02/2018   Procedure: POLYPECTOMY;  Surgeon: Ladene Artist, MD;  [facility];  Service: Endoscopy;;  . RE-EXCISION OF BREAST CANCER,SUPERIOR MARGINS Right 01/21/2013   Procedure: RE-EXCISION OF BREAST CANCER,SUPERIOR MARGINS;  Surgeon: Rolm Bookbinder, MD;  [facility];  Service: General;  Laterality: Right;  . TONSILLECTOMY     as child  . TUBAL LIGATION       ALLERGIES:  Allergies  Allergen Reactions  . Crab [Shellfish Allergy] Anaphylaxis  . Iodine Anaphylaxis  . Albuterol-Ipratropium [Ipratropium-Albuterol]     Tremors, blurred vision.   Keenan Bachelor Ellipta [Umeclidinium Bromide] Other (See Comments)    Trembling, blurred vision  . Iohexol      Code: HIVES, Desc: pt states , swelling mouth , throat, and has difficulty breathing   . Lactose Intolerance (Gi)   . Adhesive [Tape] Rash  . Latex Itching and Rash     CURRENT MEDICATIONS:  Outpatient Encounter Medications as of 05/28/2019  Medication Sig  . acetaminophen (TYLENOL) 500 MG tablet Take 1,000 mg by mouth every 6 (six) hours as needed (pain).  . Albuterol Sulfate (PROAIR RESPICLICK) 621 (90 Base) MCG/ACT AEPB Inhale 2 puffs into the lungs every 8 (eight) hours as needed. (Patient taking differently: Inhale 2 puffs into the lungs every 8 (eight) hours as needed (wheezing/shortness of breath). )  . ALPRAZolam (XANAX) 1 MG tablet Take 0.5 mg by mouth at bedtime.   . Ascorbic Acid (VITAMIN C WITH ROSE HIPS) 500 MG tablet Take 500 mg by mouth daily.   Marland Kitchen atorvastatin (LIPITOR) 20 MG tablet Take 20 mg by mouth daily.   . budesonide-formoterol (SYMBICORT) 160-4.5 MCG/ACT inhaler Inhale 2 puffs into the lungs 2 (two) times  daily.  . Calcium Carbonate-Vitamin D (CALCIUM-VITAMIN D) 500-200 MG-UNIT per tablet Take 1 tablet by mouth every Monday, Wednesday, and Friday.   . Ferrous Gluconate (IRON) 240 (27 FE) MG TABS Take 1 tablet by mouth every morning.   . hydrochlorothiazide 25 MG tablet Take 25 mg by mouth daily before breakfast.   . losartan (COZAAR) 50 MG tablet Take 50 mg by mouth daily.  . metFORMIN (GLUCOPHAGE) 500 MG tablet Take 500 mg by mouth daily with breakfast.   . metoprolol (LOPRESSOR) 100 MG tablet Take 100 mg by mouth daily before breakfast.   . montelukast (SINGULAIR) 10 MG tablet Take 1 tablet (10 mg total) by mouth at bedtime.  Marland Kitchen nystatin (MYCOSTATIN) 100000 UNIT/ML suspension  Take 5 mLs (500,000 Units total) by mouth 4 (four) times daily.  Marland Kitchen omeprazole (PRILOSEC) 20 MG capsule Take 20 mg by mouth daily before breakfast.   . OXYGEN Inhale into the lungs. Use 2L continuously  . PARoxetine (PAXIL) 40 MG tablet Take 40 mg by mouth daily before breakfast.   . predniSONE (DELTASONE) 10 MG tablet Take 2 tabs x 5 days  . Tiotropium Bromide Monohydrate (SPIRIVA RESPIMAT) 1.25 MCG/ACT AERS Inhale 2 puffs into the lungs daily.  . traMADol (ULTRAM) 50 MG tablet Take 50 mg by mouth every 12 (twelve) hours as needed (pain).    No facility-administered encounter medications on file as of 05/28/2019.      ONCOLOGIC FAMILY HISTORY:  Family History  Problem Relation Age of Onset  . Colon polyps Brother   . Multiple myeloma Brother 29  . Breast cancer Mother        dx in her 42s  . Heart disease Father   . Colon cancer Paternal Aunt   . Breast cancer Maternal Grandmother   . Colon cancer Cousin        2 paternal first cousins  . Leukemia Cousin 11  . Esophageal cancer Neg Hx   . Rectal cancer Neg Hx   . Stomach cancer Neg Hx     GENETIC COUNSELING/TESTING: ***  SOCIAL HISTORY:  TIPHANIE VO is /single/married/divorced/widowed/separated and lives alone/with her spouse/family/friend in (city),  [place].  She has (#) children and they live in (city).  Amy [name] is currently retired/disabled/working part-time/full-time as ***.  She denies any current or history of tobacco, alcohol, or illicit drug use.     PHYSICAL EXAMINATION:  Vital Signs: There were no vitals filed for this visit. There were no vitals filed for this visit. General: Well-nourished, well-appearing female in no acute distress.  Unaccompanied/Accompanied by***** today.   HEENT: Head is normocephalic.  Pupils equal and reactive to light. Conjunctivae clear without exudate.  Sclerae anicteric. Oral mucosa is pink, moist.  Oropharynx is pink without lesions or erythema.  Lymph: No cervical, supraclavicular, or infraclavicular lymphadenopathy noted on palpation.  Cardiovascular: Regular rate and rhythm.Marland Kitchen Respiratory: Clear to auscultation bilaterally. Chest expansion symmetric; breathing non-labored.  Breast Exam:  -Left breast: No appreciable masses on palpation. No skin redness, thickening, or peau d'orange appearance; no nipple retraction or nipple discharge; mild distortion in symmetry at previous lumpectomy site***healed scar without erythema or nodularity.  -Right breast: No appreciable masses on palpation. No skin redness, thickening, or peau d'orange appearance; no nipple retraction or nipple discharge; mild distortion in symmetry at previous lumpectomy site***healed scar without erythema or nodularity. -Axilla: No axillary adenopathy bilaterally.  GI: Abdomen soft and round; non-tender, non-distended. Bowel sounds normoactive. No hepatosplenomegaly.   GU: Deferred.  Neuro: No focal deficits. Steady gait.  Psych: Mood and affect normal and appropriate for situation.  MSK: No focal spinal tenderness to palpation, full range of motion in bilateral upper extremities Extremities: No edema. Skin: Warm and dry.  LABORATORY DATA:  None for this visit***   DIAGNOSTIC IMAGING:  Most recent mammogram: ***     ASSESSMENT AND PLAN:  Ms.. [name] is a pleasant 73 y.o. female with history of Stage *** right/left breast invasive ductal carcinoma, ER+/PR+/HER2-, diagnosed in (date), treated with lumpectomy, adjuvant radiation therapy, and anti-estrogen therapy with *** beginning in (date).  She presents to the [facility] for surveillance and routine follow-up.   1. History of breast cancer:  Amy [name] is currently clinically and radiographically  without evidence of disease or recurrence of breast cancer. She will be due for mammogram in ***; orders placed today.  She will continue her anti-estrogen therapy with ***, with plans to continue for *** years.  She will return to the cancer center to see her medical oncologist, Dr. ***, in ***/2018.  I encouraged her to call me with any questions or concerns before her next visit at the cancer center, and I would be happy to see her sooner, if needed.    #. Problem(s) at Visit___________________.  #. Bone health:  Given Amy [name]'s age, history of breast cancer, and her current anti-estrogen therapy with ________, she is at risk for bone demineralization. Her last DEXA scan was on **/**/20**.  In the meantime, she was encouraged to increase her consumption of foods rich in calcium, as well as increase her weight-bearing activities.  She was given education on specific food and activities to promote bone health.  #. Cancer screening:  Due to Amy [name]'s history and her age, she should receive screening for skin cancers, colon cancer, and ***gynecologic cancers. She was encouraged to follow-up with her PCP for appropriate cancer screenings.   #. Health maintenance and wellness promotion: Amy [name] was encouraged to consume 5-7 servings of fruits and vegetables per day. She was also encouraged to engage in moderate to vigorous exercise for 30 minutes per day most days of the week. She was instructed to limit her alcohol consumption and continue to abstain from  tobacco use/was encouraged stop smoking.  ***    Dispo:  -Return to cancer center ***   A total of (30) minutes of face-to-face time was spent with this patient with greater than 50% of that time in counseling and care-coordination.   Gardenia Phlegm, NP Survivorship Program [facility] [phone]   Note: PRIMARY CARE PROVIDER [place], [place] [phone]

## 2019-05-28 ENCOUNTER — Encounter: Payer: Medicare Other | Admitting: Adult Health

## 2019-07-03 ENCOUNTER — Ambulatory Visit: Payer: Medicare Other

## 2019-08-01 ENCOUNTER — Telehealth: Payer: Self-pay | Admitting: Internal Medicine

## 2019-08-01 MED ORDER — PROAIR RESPICLICK 108 (90 BASE) MCG/ACT IN AEPB: 2.0000 | INHALATION_SPRAY | Freq: Three times a day (TID) | RESPIRATORY_TRACT | 1 refills | Status: DC | PRN

## 2019-08-01 NOTE — Telephone Encounter (Signed)
Spoke with the pt  I advised that we do not have any samples of the symbicort any longer  She states her insurance covers med but it is a high copay  She states that she will obtain drug formulary so we can see about cheaper alternative  I have refilled her proair respiclick her her request  Nothing further needed

## 2019-08-08 ENCOUNTER — Telehealth: Payer: Self-pay | Admitting: Internal Medicine

## 2019-08-08 NOTE — Telephone Encounter (Signed)
Spoke with the pt  She states needing tier exception for symbicort  I called the number that she provided and was advised that she unfortunately does not qualify for this  Pt notified  She states can not afford her symbicort  She has already done pt assistance and states did not qualify for this either  I have scheduled her with pharm to discuss possible options

## 2019-08-16 ENCOUNTER — Ambulatory Visit: Payer: Medicare Other

## 2019-09-09 ENCOUNTER — Telehealth: Payer: Self-pay | Admitting: Internal Medicine

## 2019-09-09 NOTE — Telephone Encounter (Signed)
LMTCB x1 for pt. She is overdue for an appointment.

## 2019-09-10 MED ORDER — SPIRIVA RESPIMAT 1.25 MCG/ACT IN AERS: 2.0000 | INHALATION_SPRAY | Freq: Every day | RESPIRATORY_TRACT | 1 refills | Status: DC

## 2019-09-10 NOTE — Telephone Encounter (Signed)
I called and spoke with the patient and made her a televisit for 09/11/19 with Dr. Chase Caller, due to her being overdue for a visit. I have sent in a refill for her Spiriva and advised her that she must keep this appointment.

## 2019-09-11 ENCOUNTER — Ambulatory Visit (INDEPENDENT_AMBULATORY_CARE_PROVIDER_SITE_OTHER): Payer: Medicare Other | Admitting: Internal Medicine

## 2019-09-11 ENCOUNTER — Other Ambulatory Visit: Payer: Self-pay

## 2019-09-11 ENCOUNTER — Encounter: Payer: Self-pay | Admitting: Internal Medicine

## 2019-09-11 VITALS — BP 137/93 | Ht 65.0 in | Wt 250.0 lb

## 2019-09-11 DIAGNOSIS — Z7189 Other specified counseling: Secondary | ICD-10-CM | POA: Diagnosis not present

## 2019-09-11 DIAGNOSIS — J9611 Chronic respiratory failure with hypoxia: Secondary | ICD-10-CM | POA: Diagnosis not present

## 2019-09-11 DIAGNOSIS — J438 Other emphysema: Secondary | ICD-10-CM

## 2019-09-11 NOTE — Progress Notes (Signed)
IOV 10/27/2015  Chief Complaint  Patient presents with  . Pulmonary Consult    Pt self referral for DOE x 1 year. Pt c/o non prod cough with chest congestion x 1 week.     74 year-old morbidly obese female, previous history of 26 pack smoking quit 2 years ago. Also multiple cancer history and cancer survivor was reportedly in complete remission. Last CT scan of the chest in 2013 without any evidence of cancer in the lung. Followed by primary care physician. She reports insidious onset of shortness of breath for the last 1 year. Progressive only somewhat. Overall stable. Moderate in intensity. Ranging clothes walking one flight of stairs or doing groceries brings on her dyspnea. It is relieved by rest. For the last 2 weeks it is associated with cough which is dry but she does not feel sick. There is no fever or hemoptysis or orthopnea paroxysmal nocturnal dyspnea. She is morbidly obese with a BMI greater than 40 but it is unchanged. There is a history of mild sleep apnea on a sleep study per her history 10 years ago.  Walking desaturation test 180 5. 3 laps on room air with a full head probe: She walk only 30 feet and she desaturated to 85%. We walked her final feet and she desaturated to 81%. She improved a pulse ox greater than 92% at rest. She declined any November the oxygen therapy.   Ct chest oct 2013 - personally visualized IMPRESSION:  1. Surgical changes on the right. No evidence of recurrent or metastatic disease. 2. Mild centrilobular emphysema. 3. Pulmonary artery enlargement suggests pulmonary arterial hypertension. 4. Esophageal air fluid level suggests dysmotility or gastroesophageal reflux.   Original Report Authenticated By: Areta Haber, M.D.   OV sept 6254   74 year old female former smoker with COPD with emphysema, lung cancer status post right lobectomy in 2001. She has pulmonary hypertension  10/2015 CT chest -The appearance of the lungs suggests  interstitial lung disease, and imaging findings are most suggestive of nonspecific interstitial pneumonia (NSIP). 2. There also appears to be some degree of associated mucoid impaction within terminal bronchioles throughout the right lower lobe. 3. Mild diffuse bronchial wall thickening with mild centrilobular emphysema; imaging findings suggestive of underlying COPD. 4. Dilatation of the pulmonic trunk (4.1 cm in diameter), suggesting pulmonary arterial hypertension.  04/22/2016 Follow up : COPD/ILD  Patient presents for a two-month follow-up and medication review. Patient was previously on Incruse.  Incruse was not covered by insurance, changed to [employer] Four times a day  .  Says it causes some jitteriness if she does Four times a day  , better on Three times a day  .  Flu shot is utd.  Patient underwent a right heart catheter 03/02/2016 that showed mild pulmonary hypertension with normal PVR.  Says overall she is doing okay. Does get winded with activity that oxygen seems to help. She denies any chest pain, orthopnea, PND, or increased leg swelling.  OV 09/07/2016  Chief Complaint  Patient presents with  . Follow-up    Pt states her SOB is at baseline. Pt denies cough and CP/tightness. Pt states she has changed inhalers so many times d/t side effects.    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 She is overall doing well. She has difficulty affording inhalers  but the nebulizes causes side effects. Most recently we had to give her a sample of the new [employer] branded inhaler TRELEGY she says this works really well for her. Side effect profile is minimal. Dyspnea is better. There no new acute issues. She is up-to-date with her flu shot. She seems a little bit unaware of the ILD findings on  her CT chest   01/26/2017 Follow up : COPD  Pt returns for 1 month follow up . She had a slow to resolve COPD flare . She was given a steroid taper and started on Spiriva . Remains on Symbicort.  She feels it has helped her. Feels she is back to baseline .  At baseline she gets dyspnea with long walking , heavy lifting . Does grocery shopping but has to rest frequently and use buggy to rest on .   Remains on O2 at 2l/m . Feels it helps her.   OV 04/28/2017  Chief Complaint  Patient presents with  . Follow-up    Pt c/o SOB on exertion. Denies any cough or CP. DME: [employer], 2-3L 24/7.   Follow-up advanced COPD with cor pulmonale on oxygen therapy  She is here for routine follow-up. Currently exacerbation free. She is obese and uses a cane. But she did get to the office by driving. She does activities of daily living. She had intolerance to nebulizers which was started because of the donut hole issues. She is currently back on Spiriva and Symbicort and it is helping her. She remains on 2 L oxygen. She's had a flu shot there no new issues. She is worried about her long-term prognosis   OV 11/16/2017  Chief Complaint  Patient presents with  . Follow-up    Pt states she has been doing okay since last visit. States she is becoming SOB more frequently.     Follow-up advanced COPD with cor pulmonale therapy.  High-resolution CT chest June 2018 without interstitial lung disease and micro nodules are stable not requiring for the follow-up  Overall COPD stable.  Last seen October 2018.  She is obese and has a cane.  No interim exacerbations or changes in medications.  She is on triple inhaler therapy.  She has affordability issues and she is asking for samples.  We did fill out med assistance program with [employer] or [employer].  She remains on 2 L oxygen.  She states she is depressed on Paxil.  Her brother has advised her to see a psychotherapist but she feels she just needs a change in medication.   Her only child deceased 37 years ago.  Her husband is deceased.  Her parents are deceased.  And her brother has multiple myeloma.  Therefore all this is waiting in on her because she lives alone and does not have any social family anymore.  03/05/2019 Patient contacted today for televisit for medication management. She is doing ok, acute complaints of increased shortness of breath and wheezing since she went to [facility] on Friday 8/7. States that she seldomly requires Albuterol rescue inhaler but when she does use it she notices an improvement. Weather affects her breathing. Associated post nasal drip. Takes benadryl which clears it up. Experiences voice hoarseness in the afternoon or if she talks for a long period. Denies fever, chills, cough. She has been compliant with mask wearing and practicing social distancing.    OV 09/11/2019  - telephone visit. Patient identified with 2 person identifier. Risks, benefits, limitation of explained  Subjective:  Patient  ID: Amy [name], female , DOB: 11/11/1945 , age 40 y.o. , MRN: [id number] , ADDRESS: [address] [place] [address]   09/11/2019 -   Chief Complaint  Patient presents with  . Televisit    Called and spoke with pt who states she has been doing okay since last visit. Pt states she will still occ become SOB and might have to use her proair inhaler at least once a day. Pt denies any complaints of cough or chest pain.     Follow-up advanced COPD with cor pulmonale therapy.  High-resolution CT chest June 2018 without interstitial lung disease and micro nodules are stable not requiring for the follow-up   HPI Amy [name] 74 y.o. - resp status is stable. Been extremely isolating   Not had covid shot -> thinking of getting it. Has had anaphylaxis IV dye and shell fish but not for other vaccines.  Says too scared  Not had flu shot - never had flu shot reactions Just holed up inside home Too scared to venture out Does online groceries Got  to pharmacy and drive through only No one visits her - because she does not allow anyone inside.  In last 1 year - only 1 person has visited her Says covid has really scared her No surviving family members other than brother and sister in law - living 5 blocks away   -> met them and spoke to them from drive way x 4-5 times   Regarding copd - stable and has reffills. USes o2  ROS - per HPI     has a past medical history of Allergy, Anemia, Anxiety, Arthritis, Blood dyscrasia, Blood transfusion without reported diagnosis (1999), Breast cancer, right breast ([place]) (10/03/12), Cancer ([place]), Cataract, Chronic kidney disease (1999), Colon cancer ([place]) (1999, 2013), Depression, Diabetes mellitus, GERD (gastroesophageal reflux disease), H/O: lung cancer, Heart murmur, Hernia, History of colon cancer, History of kidney cancer, Hot flashes, breast cancer (2014), Hypertension, Internal hemorrhoids, Interstitial lung disease ([place]) (11/25/2015), Lung cancer ([place]) (2001), Neuromuscular disorder ([place]), Shortness of breath, Sickle cell trait ([place]), Tubular adenoma of colon, and Ulcer.   reports that she quit smoking about 5 years ago. Her smoking use included cigarettes. She started smoking about 26 years ago. She has a 26.25 pack-year smoking history. She has never used smokeless tobacco.  Past Surgical History:  Procedure Laterality Date  . ABDOMINAL HYSTERECTOMY  80's   fibroids  . BIOPSY  07/02/2018   Procedure: BIOPSY;  Surgeon: Ladene Artist, MD;  Location: [facility];  Service: Endoscopy;;  . BREAST BIOPSY     Bilateral cysts/benign  . BREAST BIOPSY Right 10/03/12   ER/PR +  . BREAST BIOPSY Right 12/20/2012   Procedure: Right breast Excisional Biopsy;  Surgeon: Rolm Bookbinder, MD;  Location: [facility];  Service: General;  Laterality: Right;  . BREAST LUMPECTOMY    . BREAST LUMPECTOMY WITH NEEDLE LOCALIZATION Right 12/20/2012   Procedure: BREAST LUMPECTOMY WITH NEEDLE LOCALIZATION;  Surgeon:  Rolm Bookbinder, MD;  Location: [facility];  Service: General;  Laterality: Right;  . BREAST LUMPECTOMY WITH RADIOACTIVE SEED LOCALIZATION Right 01/19/2015   Procedure: RIGHT BREAST LUMPECTOMY WITH RADIOACTIVE SEED LOCALIZATION;  Surgeon: Rolm Bookbinder, MD;  Location: [facility];  Service: General;  Laterality: Right;  . CARDIAC CATHETERIZATION N/A 03/02/2016   Procedure: Right Heart Cath;  Surgeon: Jolaine Artist, MD;  Location: [facility];  Service: Cardiovascular;  Laterality: N/A;  . CATARACT EXTRACTION, BILATERAL  01/2018 &  02/2018  . COLECTOMY  05-31-2012   laparoscopic ,possible open  . COLON SURGERY     chemo  . COLONOSCOPY    . COLONOSCOPY WITH PROPOFOL N/A 07/02/2018   Procedure: COLONOSCOPY WITH PROPOFOL;  Surgeon: Ladene Artist, MD;  [facility]: [facility];  Service: Endoscopy;  Laterality: N/A;  . [place]   right/chemo kidney removed for cancer  . LUNG LOBECTOMY     right lower  . PARTIAL COLECTOMY  05/31/2012   Procedure: PARTIAL COLECTOMY;  Surgeon: Rolm Bookbinder, MD;  [facility];  Service: General;  Laterality: N/A;  . PARTIAL HYSTERECTOMY    . POLYPECTOMY    . POLYPECTOMY  07/02/2018   Procedure: POLYPECTOMY;  Surgeon: Ladene Artist, MD;  Location: [facility];  Service: Endoscopy;;  . RE-EXCISION OF BREAST CANCER,SUPERIOR MARGINS Right 01/21/2013   Procedure: RE-EXCISION OF BREAST CANCER,SUPERIOR MARGINS;  Surgeon: Rolm Bookbinder, MD;  [facility];  Service: General;  Laterality: Right;  . TONSILLECTOMY     as child  . TUBAL LIGATION      Allergies  Allergen Reactions  . Crab [Shellfish Allergy] Anaphylaxis  . Iodine Anaphylaxis  . Albuterol-Ipratropium [Ipratropium-Albuterol]     Tremors, blurred vision.   Keenan Bachelor Ellipta [Umeclidinium Bromide] Other (See Comments)    Trembling, blurred vision  . Iohexol      Code: HIVES, Desc: pt states , swelling mouth , throat, and has difficulty breathing   .  Lactose Intolerance (Gi)   . Adhesive [Tape] Rash  . Latex Itching and Rash    Immunization History  Administered Date(s) Administered  . Influenza, High Dose Seasonal PF 04/18/2016, 04/09/2018  . Influenza,inj,Quad PF,6+ Mos 08/26/2015  . Influenza-Unspecified 04/26/2017  . Pneumococcal Conjugate-13 08/26/2015    Family History  Problem Relation Age of Onset  . Colon polyps Brother   . Multiple myeloma Brother 87  . Breast cancer Mother        dx in her 60s  . Heart disease Father   . Colon cancer Paternal Aunt   . Breast cancer Maternal Grandmother   . Colon cancer Cousin        2 paternal first cousins  . Leukemia Cousin 11  . Esophageal cancer Neg Hx   . Rectal cancer Neg Hx   . Stomach cancer Neg Hx      Current Outpatient Medications:  .  acetaminophen (TYLENOL) 500 MG tablet, Take 1,000 mg by mouth every 6 (six) hours as needed (pain)., Disp: , Rfl:  .  Albuterol Sulfate (PROAIR RESPICLICK) 051 (90 Base) MCG/ACT AEPB, Inhale 2 puffs into the lungs every 8 (eight) hours as needed (wheezing/shortness of breath)., Disp: 1 each, Rfl: 1 .  ALPRAZolam (XANAX) 1 MG tablet, Take 0.5 mg by mouth at bedtime. , Disp: , Rfl:  .  Ascorbic Acid (VITAMIN C WITH ROSE HIPS) 500 MG tablet, Take 500 mg by mouth daily. , Disp: , Rfl:  .  atorvastatin (LIPITOR) 20 MG tablet, Take 20 mg by mouth daily. , Disp: , Rfl:  .  budesonide-formoterol (SYMBICORT) 160-4.5 MCG/ACT inhaler, Inhale 2 puffs into the lungs 2 (two) times daily., Disp: 1 Inhaler, Rfl: 5 .  Calcium Carbonate-Vitamin D (CALCIUM-VITAMIN D) 500-200 MG-UNIT per tablet, Take 1 tablet by mouth every Monday, Wednesday, and Friday. , Disp: , Rfl:  .  Ferrous Gluconate (IRON) 240 (27 FE) MG TABS, Take 1 tablet by mouth every morning. , Disp: , Rfl:  .  hydrochlorothiazide 25 MG tablet,  Take 25 mg by mouth daily before breakfast. , Disp: , Rfl:  .  losartan (COZAAR) 50 MG tablet, Take 50 mg by mouth daily., Disp: , Rfl:  .  metFORMIN  (GLUCOPHAGE) 500 MG tablet, Take 500 mg by mouth daily with breakfast. , Disp: , Rfl:  .  metoprolol (LOPRESSOR) 100 MG tablet, Take 100 mg by mouth daily before breakfast. , Disp: , Rfl:  .  omeprazole (PRILOSEC) 20 MG capsule, Take 20 mg by mouth daily before breakfast. , Disp: , Rfl:  .  OXYGEN, Inhale into the lungs. Use 2L continuously, Disp: , Rfl:  .  PARoxetine (PAXIL) 40 MG tablet, Take 40 mg by mouth daily before breakfast. , Disp: , Rfl:  .  Tiotropium Bromide Monohydrate (SPIRIVA RESPIMAT) 1.25 MCG/ACT AERS, Inhale 2 puffs into the lungs daily., Disp: 4 g, Rfl: 1 .  traMADol (ULTRAM) 50 MG tablet, Take 50 mg by mouth every 12 (twelve) hours as needed (pain). , Disp: , Rfl:       Objective:   Vitals:   09/11/19 1002  BP: (!) 137/93  Weight: 250 lb (113.4 kg)  Height: _0  (1.651 m)    Estimated body mass index is 41.6 kg/m as calculated from the following:   Height as of this encounter: _1  (1.651 m).   Weight as of this encounter: 250 lb (113.4 kg).  _2 @  [employer]   09/11/19 1002  Weight: 250 lb (113.4 kg)     Physical Exam  Sounded normal on phone       Assessment:       ICD-10-CM   1. Advice given about COVID-19 virus by telephone  Z71.89   2. Chronic respiratory failure with hypoxia (HCC)  J96.11   3. Other emphysema ([place])  J43.8    covid advise - is new issue    Plan:     Patient Instructions     ICD-10-CM   1. Chronic respiratory failure with hypoxia (HCC)  J96.11   2. Other emphysema ([place])  J43.8   3. Advice given about COVID-19 virus by telephone  Z71.89     Stable We discusseed covid vaccine side effect and allergy   - no issues with other vaccines but had anaphylaxis with IV dye  - so there is some elevaterd but still low risk for reaction with covid vaccine  Plan Continue o2, spiriva, and symbicort - take samples of each Albuterol as needed Your choice for covid-19 vaccine If pick up covid - call us for  consideration for outpatient antibody treatment   follolwup 6 months or sooner if needed - CAT score at followup - come for face to face   SIGNATURE    Dr. Brand Males, M.D., F.C.C.P,  Pulmonary and Critical Care Medicine Staff Physician, [place] Director - Interstitial Lung Disease  Program  [facility] [place], [place], [address]  Pager: [phone], If no answer or between  15:00h - 7:00h: call [phone] Telephone: [phone]  10:45 AM 09/11/2019

## 2019-09-11 NOTE — Patient Instructions (Addendum)
ICD-10-CM   1. Advice given about COVID-19 virus by telephone  Z71.89   2. Chronic respiratory failure with hypoxia (HCC)  J96.11   3. Other emphysema ([place])  J43.8     Stable We discusseed covid vaccine side effect and allergy   - no issues with other vaccines but had anaphylaxis with IV dye  - so there is some elevaterd but still low risk for reaction with covid vaccine  Plan Continue o2, spiriva, and symbicort - take samples of each Albuterol as needed Your choice for covid-19 vaccine If pick up covid - call us for consideration for outpatient antibody treatment   follolwup 6 months or sooner if needed - CAT score at followup - come for face to face

## 2020-02-13 ENCOUNTER — Telehealth: Payer: Self-pay | Admitting: Internal Medicine

## 2020-02-13 MED ORDER — SPIRIVA RESPIMAT 1.25 MCG/ACT IN AERS: 2.0000 | INHALATION_SPRAY | Freq: Every day | RESPIRATORY_TRACT | 6 refills | Status: DC

## 2020-02-13 NOTE — Telephone Encounter (Signed)
Refill sent to preferred pharmacy.

## 2020-02-18 ENCOUNTER — Telehealth: Payer: Self-pay | Admitting: Internal Medicine

## 2020-02-18 NOTE — Telephone Encounter (Signed)
ATC patient unable to reach LM to call back office (x1)  

## 2020-02-19 NOTE — Telephone Encounter (Signed)
LMTCB x2 for pt 

## 2020-02-21 MED ORDER — SPIRIVA RESPIMAT 1.25 MCG/ACT IN AERS: 2.0000 | INHALATION_SPRAY | Freq: Every day | RESPIRATORY_TRACT | 3 refills | Status: DC

## 2020-02-21 NOTE — Telephone Encounter (Signed)
Called and spoke with pt who stated she was needing a refill of her spiriva to be sent to [facility]. Phone number associated with pharmacy is [phone].  Stated to pt that we would take care of refilling her med and she verbalized understanding.  Pt was also due for an appt so I have scheduled pt an appt. Nothing further needed.

## 2020-02-21 NOTE — Telephone Encounter (Signed)
Pt called back about this, please return call.  

## 2020-02-26 ENCOUNTER — Telehealth: Payer: Self-pay | Admitting: Internal Medicine

## 2020-02-26 NOTE — Telephone Encounter (Signed)
Spoke with patient, she states she received a new oxygen concentrator and states it is very loud and she cannot sleep with it in her room, it has been moved out of her room and it is still loud to where she cannot hear the TV to watch it.  She states the one she had from 2017 until now was not loud and stayed in her room and did not bother her.  I let her know that I would cal [employer] and see what could be done.  Called [facility] in [place] to let them know about the oxygen concentrator making a lot of noise.  I spoke with Nira Conn, she put in a request for a technician to check her concentrator when they are in her area.  Spoke with patient and let her know that a request has been put in for a technician to come by to check her oxygen concentrator when they are in her area in the next couple days.  Advised her to let them know if it stops working, does not feel like she is getting enough oxygen or not working well in any other way and they will put the ticket in as urgent.  She verbalized understanding.  Nothing further needed.

## 2020-03-03 ENCOUNTER — Ambulatory Visit: Payer: Medicare Other | Admitting: Primary Care

## 2020-04-02 ENCOUNTER — Other Ambulatory Visit: Payer: Self-pay

## 2020-04-02 NOTE — Patient Outreach (Signed)
[facility]) Care Management  04/02/2020  Amy [name] 03/30/1946 [id number]   [facility] Evaluation Interviewer attempted to call patient on today regarding Aging Gracefully referral. No answer from patient after multiple rings.  Will attempt to call back within 1 week.   [facility] Management Assistant

## 2020-04-07 ENCOUNTER — Other Ambulatory Visit: Payer: Self-pay

## 2020-04-07 NOTE — Patient Outreach (Signed)
Aging Gracefully Program  04/07/2020  Amy [name] November 10, 1945 [id number]   [facility] Evaluation Interviewer attempted to call patient on today regarding Aging Gracefully referral. No answer from patient after multiple rings. Left message to call back.  Will attempt to call back within 1 week.   [facility] Management Assistant

## 2020-04-15 ENCOUNTER — Other Ambulatory Visit: Payer: Self-pay

## 2020-04-15 NOTE — Patient Outreach (Signed)
Aging Gracefully Program  04/15/2020  Amy [name] June 03, 1946 [id number]   [facility] Evaluation Interviewer made contact with patient. Aging Gracefully survey completed.   Interviewer will send referral to OT for follow up.   [facility] Management Assistant

## 2020-05-01 ENCOUNTER — Other Ambulatory Visit: Payer: Self-pay | Admitting: Specialist

## 2020-05-03 ENCOUNTER — Other Ambulatory Visit: Payer: Self-pay

## 2020-05-03 NOTE — Patient Outreach (Signed)
Aging Gracefully Program  OT Initial Visit  05/01/2020  Amy [name] Dec 22, 1945 [id number]  Visit:  1- Initial Visit  Start Time:  1330 End Time:  1430 Total Minutes:  60  CCAP: Typical Daily Routine: Typical Daily Routine:: Patient admits to being isolated since COVID and feels depressed.  She has consulted therapists, counselors, etc and still feels depressed, stating her house depresses her because she used to keep it clean and now she cant.  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  She admits to cancelling preventative MD appts due to being fearful to go out in the community. What Types Of Care Problems Are You Having Throughout The Day?: fatigues easily, cant get motivated, cant get rid of clutter What Kind Of Help Do You Receive?: neighbor takes garbage out once she puts on porch, gets groceries delivered Do You Think You Need Other Types Of Help?: some help decluttering the house What Do You Think Would Make Everyday Life Easier For You?: more energy What Is A Good Day Like?: tired What Is A Bad Day Like?: not as tired or depressed Do You Have Time For Yourself?: yes - maybe too much Patient Reported Equipment: Patient Reported Equipment Currently Used: Grab Bars, Long Handle Sponge, Raised Toilet Seat, Side Rails On Bed, Civil engineer, contracting, Single [employer] Functional Mobility-Walking Indoors/Getting Around the [employer]:   Functional Mobility-Walk A Block: Walk A Block: Unable To Do Do You:: N/A- Not Applicable Functional Mobility-Maintain Balance While Showering: Maintaining Balance While Showering: No Difficulty Do You:: Use A Device Importance Of Learning New Strategies:: Not At All Functional Mobility-Stooping, Crouching, Kneeling To Retreive Item: Stooping, Crouching, or Kneeling To Retrieve Item: A Little Difficulty Do You:: No Device/No Assistance Importance Of Learning New Strategies:: Not At  All Functional Mobility-Bending From Standing Position To Pick Up Clothing Off The Floor: Bending Over From Standing Position To Pick Up Clothing Off The Floor: A Little Difficulty Do You:: No Device/No Assistance Importance Of Learning New Strategies:: Not At All Functional Mobility-Reaching For Items Above Shoulder Level: Reaching For Items Above Shoulder Level: No Difficulty Do You:: No Device/No Assistance Importance Of Learning New Strategies:: Not At All Functional Mobility-Climb 1 Flight Of Stairs: Climb 1 Flight Of Stairs: N/A Functional Mobility-Move In And Out Of Chair: Move In and Out Of A Chair: No Difficulty Do You:: No Device/No Assistance Importance Of Learning New Strategies:: Not At All Functional Mobility-Move In And Out Of Bed: Move In and Out Of Bed: No Difficulty Do You:: Use A Device Importance Of Learning New Strategies:: A Little Functional Mobility-Move In And Out Of Bath/Shower: Move In And Out Of A Bath/Shower: No Difficulty Do You:: Use A Device Importance Of Learning New Strategies:: Not At All Functional Mobility-Get On And Off Toilet: Getting Up From The Floor: A Little Difficulty Do You:: No Device/No Assistance Importance Of Learning New Strategies:: A Little Functional Mobility-Into And Out Of Car, Not Including Driving: Into  And Out Of Car, Not Including Driving: No Difficulty Functional Mobility-Other Mobility Difficulty:      Activities of Daily Living-Bathing/Showering: ADL-Bathing/Showering: A Little Difficulty Do You:: Use A Device Importance Of Learning New Strategies: Not At All Activities of Daily Living-Personal Hygiene and Grooming: [place] and Grooming: No Difficulty Do You:: No Device/No Assistance Importance Of Learning New Strategies: Not At All Activities of Daily Living-Toilet Hygiene: Toilet Hygiene: No Difficulty Do You:: No Device/No Assistance  Importance Of Learning New Strategies: Not At All Activities of  Daily Living-Put On And Take Off Undergarments (Incl. Fasteners): Put On And Take Off Undergarments (Incl. Fasteners): No Difficulty Do You:: No Device/No Assistance Importance Of Learning New Strategies: Not At All Activities of Daily Living-Put On And Take Off Shirt/Dress/Coat (Incl. Fasteners): Put On And Take Off Shirt/Dress/Coat (Incl. Fasteners): No Difficulty Do You:: No Device/No Assistance Importance Of Learning New Strategies: Not At All Activities of Daily Living-Put On And Take Off Socks And Shoes: Put On And Take Off Socks And  Shoes: No Difficulty Do You:: No Device/No Assistance Importance Of Learning New Strategies: Not At All Activities of Daily Living-Feed Self: Feed Self: No Difficulty Do You:: No Device/No Assistance Importance Of Learning New Strategies: Not At All Activities of Daily Living-Rest And Sleep: Rest and Sleep: A Lot of Difficulty Do You:: No Device/No Assistance Importance Of Learning New Strategies: Very Much ADL Observation: Rest And Sleep: Independent With Pain, Difficulty, Or Use Of Device Safety: No Risk Efficiency: Somewhat Intervention: Yes Activities of Daily Living-Sexual Activity: Sexual  Activity: N/A Activities of Daily Living-Other Activity Identified:    Instrumental Activities of Daily Living-Light Homemaking (Laundry, Straightening Up, Vacuuming):  Do Light Homemaking (Laundry, Straightening Up, Vacuuming): A Lot of Difficulty Do You:: No Device/No Assistance Importance Of Learning New Strategies: Very Much IADL Observation: Do Light Homemaking (Laundry, Straightening Up, Vacuuming): Independent With Pain, Difficulty, Or Use Of Device Safety: A Little Risk Efficiency: Somewhat Intervention: Yes Instrumental Activities of Daily Living-Making A Bed: Making a Bed: No Difficulty Do You:: No Device/No Assistance Importance Of Learning New Strategies: Not At All Instrumental Activities of Daily Living-Washing Dishes By Hand While  Standing At The Sink: Washing Dishes By Hand While Standing At The Sink: No Difficulty Do You:: No Device/No Assistance Importance Of Learning New Strategies: Not At All Instrumental Activities of Daily Living-Grocery Shopping: Do Grocery Shopping: A Little Difficulty Do You:: Use Personal Assistance Importance Of Learning New Strategies: Not At All Other Comments:: delivery Instrumental Activities of Daily Living-Use Telephone: Use Telephone: No Difficulty Do You:: No Device/No Assistance Importance Of Learning New Strategies: Not At All Instrumental Activities of Daily Living-Financial Management: Financial Management: No Difficulty Do You:: No Device/No Assistance Importance Of Learning New Strategies: Not At All Instrumental Activities of Daily Living-Medications: Take Medications: No Difficulty Do You:: No Device/No Assistance Importance Of Learning New Strategies: Not At All Instrumental Activities of Daily Living-Health Management And Maintenance: Health Management & Maintenance: A Little Difficulty Do You:: No Device/No Assistance Importance Of Learning New Strategies: A Little Other Comments:: makes the appts then cx due to being fearful of going in community due to [place] of Daily Living-Meal Preparation and Clean-Up: Meal Preparation and Clean-Up: No Difficulty Do You:: No Device/No Assistance Importance Of Learning New Strategies: Not At All Instrumental Activities of Daily Living-Provide Care For Others/Pets: Care For Others/Pets: N/A Instrumental Activities of Daily Living-Take Part In Organized Social Activities: Take Part In Organized Social Activities: N/A Instrumental Activities of Daily Living-Leisure Participation: Leisure Participation: N/A Do You:: N/A-Not Applicable Instrumental Activities of Daily Living-Employment/Volunteer Activities: Employment/Volunteer Activities: No Difficulty Do You:: N/A-Not Applicable Importance Of  Learning New Strategies: N/A Instrumental Activities of Daily Living-Other Identifies: Other IADL Identified: energy conservation Other IADL Identified Difficulty: Moderate Difficulty Do You:: N/A- Not Applicable Importance Of Learning New Strategies:: Very Much IADL Observation: Other Identified: Minimal Assistance Safety: A Little Risk Efficiency: Not At All Intervention: Yes  Readiness To Change Score:  Readiness to Change Score: 6.33  Home Environment Assessment: Outside Home Entry:: steps are in good condition Entryway/Foyer:: n/a Dining Room:: table is covered with medications and other supplies 1 pathway to walk through Living Room:: main area of occupance, 1 pathway to walk through [employer]:: n/a Stairs:: no stairs Bathroom:: has elevated commode seat, grab bars in shower, Master Bedroom:: able to get to and from bed and in and out of bed, 1 pathway Laundry:: in kitchen Basement:: n/a Hallways:: 1 pathway Smoke/CO2 Detector:: unsure Veterinary surgeon:: unsure Mailbox:: accessible Other Home Environment Concerns:: patient states roof is in bad repair and leaks  Patient Education: Education Provided: Yes Education Details: educated and provided home safety checklist for patient to review Person(s) Educated: Patient Comprehension: Verbalized Understanding  Goals: Goals Addressed              This Visit's Progress   .  Paitent will learn and apply energy conservation techniques to improve her endurance and independence with daily tasks. (pt-stated)      .  patient will improve independence with light cleaning tasks. (pt-stated)      .  patient will learn strategies to improve sleep hygiene.         Post Clinical Reasoning: Clinician View Of Client Situation:: Paitent is isolated at home due to COVID, and admits to being depressed.  She is sad that her home is in the state it is in and desires to declutter it, however doesnt have the physical ability to do so  alone. Client View Of His/Her Situation:: does pretty well, depressed due to isolation from [place] and the current state of her home Next Visit Plan:: problem solve goal 1 energy conservation  Vangie Bicker, [place], OTR/L [phone]

## 2020-05-22 ENCOUNTER — Other Ambulatory Visit: Payer: Self-pay | Admitting: *Deleted

## 2020-05-22 NOTE — Patient Outreach (Signed)
Aging Gracefully Program  05/22/2020  DORATHA MCSWAIN 03-17-1946 [id number]  Placed call to client [name] to schedule initial Aging Gracefully RN home visit - unsuccessful outreach attempt  HIPAA compliant voice mail message left for client, requesting return call back.  Plan:  Will re-attempt [facility] CM telephone outreach next week if I do not hear back from client first  Oneta Rack, RN, BSN, [employer] Coordinator [facility] Care Management  [phone]

## 2020-05-28 ENCOUNTER — Encounter: Payer: Self-pay | Admitting: Specialist

## 2020-05-28 ENCOUNTER — Other Ambulatory Visit: Payer: Self-pay | Admitting: *Deleted

## 2020-05-28 NOTE — Patient Outreach (Signed)
Aging Gracefully Program  05/28/2020  Amy [name] Jan 02, 1946 [id number]  Placed second outreach call attempt to client [name] to schedule initial Aging Gracefully RN home visit - unsuccessful outreach attempt again today.  HIPAA compliant voice mail message left for client on home number, requesting return call back.  Upon attempt to call number listed for client as mobile number, received automated outgoing voice message stating that patient is not available/ not accepting calls; unable to leave voice message requesting call-back on this number  Plan:  Will re-attempt [facility] CM telephone outreach next week if I do not hear back from client first  Oneta Rack, RN, BSN, [employer] Coordinator [facility] Care Management  [phone]

## 2020-06-01 ENCOUNTER — Other Ambulatory Visit: Payer: Self-pay | Admitting: *Deleted

## 2020-06-01 NOTE — Patient Outreach (Signed)
Aging Gracefully Program  06/01/2020  [name] [birth year] [id number]  Placed third outreach/ call attempt toclient Amy [name] schedule initial Aging Gracefully RN home visit- unsuccessful outreach attempt again today.  HIPAA compliant voice mail message left forclient on home number, again requesting return call back.  Plan:  Will collaborate with Aging Gracefully OT to schedule Aging Gracefully RN Visit # 1 if I do not hear back from client by next scheduled OT visit on 06/05/20  Oneta Rack, RN, BSN, [place] Coordinator [facility] Care Management  [phone]

## 2020-06-05 ENCOUNTER — Encounter: Payer: Self-pay | Admitting: Specialist

## 2020-06-08 ENCOUNTER — Other Ambulatory Visit: Payer: Self-pay | Admitting: *Deleted

## 2020-06-08 NOTE — Patient Outreach (Signed)
Aging Gracefully Program  06/08/2020  [name] 1945/09/29 [id number]  Placed fourth consecutive unsuccessful outreach attempt toclient Jeris Penta schedule initial Aging Gracefully RN home visit- unsuccessful outreach attempt again today.  HIPAA compliant voice mail message left forclient on home number, requesting return call back.  Upon attempt to call number listed for client as mobile number, I again received automated outgoing voice message stating that patient is not available/ not accepting calls; unable to leave voice message requesting call-back on this number  Plan:  Will outreach [facility] team to facilitate patient call-back if I do not hear back from clientby end of day today, as previously planned facilitation with Aging Gracefully OT was not completed due to re-scheduling of second OT visit, from 06/05/20 to 06/23/20  Oneta Rack, RN, BSN, [place] Coordinator [facility] Care Management  [phone]

## 2020-06-11 ENCOUNTER — Other Ambulatory Visit: Payer: Self-pay | Admitting: *Deleted

## 2020-06-11 NOTE — Patient Outreach (Signed)
Aging Gracefully Program  06/11/2020  LINNIE DELGRANDE 01-28-1946 [id number]  Placed 5th unsuccessful outreach call to client to schedule initial Aging Gracefully RN home visit  HIPAA compliant voice mail message again left for client, requesting return call back.  Plan:  Will make Aging Gracefully team (again) aware of my lack of success in reaching client next week, if I do not hear from client by end of business day tomorrow.  Oneta Rack, RN, BSN, [employer] [facility] Care Management  [phone]

## 2020-06-17 ENCOUNTER — Other Ambulatory Visit: Payer: Self-pay | Admitting: *Deleted

## 2020-06-17 NOTE — Patient Outreach (Signed)
Aging Gracefully Program  06/17/2020  Amy [name] Jul 18, 1946 [id number]  Received incoming voice mail from patient-- placed sixth outreach call attempt toclient Amy [name] schedule initial Aging Gracefully RN home visit- unsuccessful outreach attempt again today.  HIPAA compliant voice mail message left forclient on home number, requesting return call back.  In my voice mail to patient, made her aware of [facility] CM/ Aging Gracefully office closing over Thanksgiving and let her know I would re-attempt outreach next week  Plan:  Will re-attempt outreach to client next week to hopefully schedule Aging Gracefully RN initial visit  Oneta Rack, RN, BSN, [employer] Coordinator [facility] Care Management  [phone]

## 2020-06-22 ENCOUNTER — Other Ambulatory Visit: Payer: Self-pay | Admitting: *Deleted

## 2020-06-22 NOTE — Patient Outreach (Signed)
Aging Gracefully Program  06/22/2020  MERA GUNKEL [birth year] [id number]  Placed call to client Amy [name]  to schedule initial Aging Gracefully RN home visit; HIPAA/ identity verified.     Visit scheduled with client for Tuesday July 07, 2020 at 2:00 pm; verified patient's address and explained that I would contact her by phone on day of scheduled visit prior to arriving at her home, to complete coronavirus questionnaire screening tool; confirmed that patient has a mask available at her home for use during scheduled visit.   Provided my direct contact information to patient, should she wish to contact me prior to scheduled home visit.   Plan:   Scheduled RN initial home visit on Tuesday July 07, 2020 at 2:00 pm  Oneta Rack, [place], BSN, [place] Care Management  [phone]

## 2020-06-23 ENCOUNTER — Other Ambulatory Visit: Payer: Self-pay | Admitting: Specialist

## 2020-06-24 ENCOUNTER — Other Ambulatory Visit: Payer: Self-pay

## 2020-06-24 NOTE — Patient Outreach (Signed)
Aging Gracefully Program  OT Follow-Up Visit  06/24/2020  Amy [name] 1946/01/23 [id number]  Visit:  2- Second Visit  Start Time:  7564 End Time:  1600 Total Minutes:  65   Patient Education: Education Provided: Yes Education Details: educated patient on how to safely get up from a fall and the 4 P's of energy conservation Person(s) Educated: Patient Comprehension: Verbalized Understanding, Returned Demonstration, Other (comment) (handout provided)  Goals:  Goals Addressed              This Visit's Progress   .  Paitent will learn and apply energy conservation techniques to improve her endurance and independence with daily tasks. (pt-stated)   On track     ACTION PLANNING - CUSTOM  Target Problem Area:  decreased energy level when completing daily tasks   Why Problem May Occur: Breathing concerns/O2 level Decreased activity levels due to COVID   Target Goal:  learn and apply energy conservation techniques to apply to everyday routines.    STRATEGIES Saving Your Energy: DO: DON'T:  priortize   pace             Modifying your home environment and making it safe: DO: DON'T:  plan                Simplifying the way you set up tasks or daily routines: DO: DON'T:  position           Practice It is important to practice the strategies so we can determine if they will be effective in helping to reach your goal. Follow these specific recommendations: Remember and Practice the 4 Ps 1. Prioritize Decide what needs to be done today and what can be done at a later date or time. For example, going to a doctor's appointment would take priority over dusting the living room. When you have more than one thing to do, begin with the most important to make sure it gets done. 2. Plan Plan your activities first to avoid extra trips. Gather the supplies and equipment you need before doing the job. For example, get your garden supplies and tools ready before you  start to plant flowers. Plan to alternate heavy and light tasks. Plan activities throughout the week to avoid doing too many activities in one day. Put a schedule on the refrigerator to remind you and others who is doing what. Plan to get a good rest each night. Use family and friends or pay for help to complete tasks you may struggle with or that require too much energy. 3. Pace Maintain a slow and steady pace. Never rush.  2 3. Pace (continued) Rest often. Rest before you feel tired. Avoid holding your breath. Practice breathing slow and steady. Use pursed lip breathing. Breathe in through your nose for a count of 2 and out from your mouth for a count of 4. This is like blowing out a candle on a cake. Remember that you may have to ask for help to do some tasks and that is okay. Listen to your body and know your limits. 4. Position Too much bending and reaching can cause fatigue and shortness of breath. Use a reacher, sock aid, long handled shoe horn and/or elastic shoelaces. Avoid bending and reaching too much. Always maintain a nice upright posture when sitting and standing. This helps you get more oxygen into your lungs and around your body to work better. Sit when you can. Sitting supports your body so you can focus on your  breathing and activities while conserving your energy. Sitting reduces energy use by 25%. If a strategy does not work the first time, try it again and again (and maybe again). We may make some changes over the next few sessions, based on how they work.  Vangie Bicker, MHA, OTR/L [phone]          Post Clinical Reasoning: Client Action (Goal) One Interventions: improve energy level when completing daily tasks Did Client Try?: Yes Targeted Problem Area Status: A Little Better Clinician View Of Client Situation:: slight increase in motivation.  Client vacuumed her floor and was quite receptive to energy conservation education.  Client is  resourceful and has already implemented several energy conservation/adaptive equipment/dme options on her own prior to enrolling in Management consultant Of His/Her Situation:: continues to feel isolated due to [place] and has noted a decrease in energy as she is not getting out and being as active as she once was. Next Visit Plan:: problem solve gola 2 sleep hygiene Vangie Bicker, [place], OTR/L [phone]

## 2020-07-07 ENCOUNTER — Other Ambulatory Visit: Payer: Self-pay
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
## 2020-07-10 ENCOUNTER — Other Ambulatory Visit: Payer: Self-pay | Admitting: Internal Medicine

## 2020-07-10 DIAGNOSIS — Z1231 Encounter for screening mammogram for malignant neoplasm of breast: Secondary | ICD-10-CM

## 2020-07-24 ENCOUNTER — Encounter: Payer: Self-pay | Admitting: Specialist

## 2020-07-30 ENCOUNTER — Other Ambulatory Visit: Payer: Self-pay

## 2020-07-30 NOTE — Patient Outreach (Signed)
Aging Gracefully Program  07/30/2020  Amy [name] 1945/10/18 [id number]   Aging gracefully RN home visit:  Arrived at home for scheduled home visit. No answer at the door. No answer on the phone.  Called into the home 2 times and left a voicemail. After 15 minutes of trying, I left the residence.   Tomasa Rand, RN, BSN, CEN [facility] [employer] [phone]

## 2020-07-31 ENCOUNTER — Other Ambulatory Visit: Payer: Self-pay

## 2020-07-31 NOTE — Patient Outreach (Signed)
[facility]) Care Management  07/31/2020  Amy [name] Dec 17, 1945 [id number]   Care Coordination: Placed call to patient who answered and states that she did not remember appointment.   Rescheduled home visit for 08/05/2020  Tomasa Rand, RN, BSN, [place] Coordinator [phone]

## 2020-08-03 DIAGNOSIS — J439 Emphysema, unspecified: Secondary | ICD-10-CM | POA: Diagnosis not present

## 2020-08-05 ENCOUNTER — Other Ambulatory Visit: Payer: Self-pay

## 2020-08-05 NOTE — Patient Outreach (Signed)
Aging Gracefully Program  08/05/2020  Amy [name] Mar 06, 1946 [id number]   Care Coordination:  Incoming call from patient who had a scheduled home visit today.  Patient reports she is sick with diarrhea.   Home visit rescheduled 08/13/2020 at [place], RN, BSN, [facility] [employer] [phone]

## 2020-08-06 ENCOUNTER — Telehealth: Payer: Self-pay | Admitting: Specialist

## 2020-08-06 ENCOUNTER — Encounter: Payer: Self-pay | Admitting: Specialist

## 2020-08-06 NOTE — Telephone Encounter (Signed)
Per [facility] RN note patient sick with diarrhea on 08/05/20.  Called to speak with patient regarding her current health for today's visit with no answer.  Requested patient call me back at [phone] to reschedule. Vangie Bicker, [place], OTR/L [phone]

## 2020-08-13 ENCOUNTER — Other Ambulatory Visit: Payer: Self-pay

## 2020-08-13 DIAGNOSIS — J439 Emphysema, unspecified: Secondary | ICD-10-CM | POA: Diagnosis not present

## 2020-08-14 NOTE — Patient Instructions (Signed)
Next home visit planned for 08/26/2020 at 0930.  Continue to work on getting your days and nights on track. Do your home exercises daily .

## 2020-08-14 NOTE — Patient Outreach (Signed)
Aging Gracefully Program  RN Visit  08/14/2020  Amy [name] [birth year] [id number]  Visit:   RN home visit #2  Start Time:   1600 End Time:   1645 Total Minutes:   10  Readiness To Change Score:     Universal RN Interventions: Calendar Distribution: Yes Exercise Review: Yes Medications: Yes Medication Changes: Yes Mood: Yes Pain: Yes PCP Advocacy/Support: Yes Fall Prevention: Yes Incontinence: No Clinician View Of Client Situation: Arrived to home visit. Knocked and knocked and rand the doorbell with no response. Placed call to patient who answered and finally came to the door.  Patient appears sleepy. Home cluttered.  Patient however willing to review exercise plan. Client View Of His/Her Situation: Patient to reports to me that she can not get her days and nights straight. Reports she has tied. Admits to me that she stayed up until 7am this morning before going to bed. Reports that is why she did not hear me knock on the door.  Reports no additional diarrhea since for one dady last week. Denies any covid exposure or symtoms.  reports no falls and no pain. Report she has been playing games on her phone to keep herself occupied.  Healthcare Provider Communication:none. Encouraged patient to call MD and make a follow up appointment.     Clinician View of Client Situation: Clinician View Of Client Situation: Arrived to home visit. Knocked and knocked and rand the doorbell with no response. Placed call to patient who answered and finally came to the door.  Patient appears sleepy. Home cluttered.  Patient however willing to review exercise plan. Client's View of His/Her Situation: Client View Of His/Her Situation: Patient to reports to me that she can not get her days and nights straight. Reports she has tied. Admits to me that she stayed up until 7am this morning before going to bed. Reports that is why she did not hear me knock on the door.  Reports no additional diarrhea since for  one dady last week. Denies any covid exposure or symtoms.  reports no falls and no pain. Report she has been playing games on her phone to keep herself occupied.  Medication Assessment: denies any medications changes    OT Update: pending home modifications. Placed call to OT during this home visit to assist with appointment scheduling. OT called back and secured an appointment date.   Session Summary: no changes noted. Provided home exercise plan. Reviewed demonstrated home exercises. Patient was able to return demonstrate exercises.   Goals Addressed            This Visit's Progress    Patient Stated       Aging Gracefully RN goal:  07/07/2020  Patient reports she will go to between 10-1030pm and get up around 9-930 am to help get her days and nights corrected within the  next 60 days.   Interventions:         Reviewed being on a day night scheduled will help with her orientation and her medication schedule  Reviewed importance of getting enough sleep and benefits to her health.   Tomasa Rand, RN, BSN, CEN [place] Coordinator [phone]   08/13/2020  Patient reports she continues to struggle with sleep schedules.  Reports going to bed at 7am and sleeping all day.   Interventions:  encouraged and reviewed importance of good sleep patterns. Encouraged patient to try to go to bed 30 minutes to an hour earlier every night and then set alarm  to get up.    Tomasa Rand, RN, BSN, CEN [place] Coordinator [phone]      Patient Stated       Aging Gracefully:  Patient will reports keeping herself busy by doing crossword puzzles or word search daily for the next 60 days.  Interventions: Reviewed with this patient how being busy will help her with her depression and keep her mind sharp. She agrees to try.  Will send in basket message to MD about change of depression medications.  Tomasa Rand, RN, BSN, CEN [place] Coordinator [phone]    08/13/20 Patient reports playing games on her phone. Encouraged memory games.  No response from MD about depression. Patient reports she will discuss at next  office visit.  Interventions: encouraged patient to make follow up appointment with Md and review difficulty with depression and sleep.       Next home visit planned for 08/26/20    0930 am Tomasa Rand, RN, BSN, [facility] [employer] [phone]

## 2020-08-18 ENCOUNTER — Other Ambulatory Visit: Payer: Self-pay | Admitting: Specialist

## 2020-08-19 ENCOUNTER — Other Ambulatory Visit: Payer: Self-pay

## 2020-08-19 ENCOUNTER — Ambulatory Visit: Payer: Medicare Other

## 2020-08-19 NOTE — Patient Outreach (Signed)
Aging Gracefully Program  OT Follow-Up Visit  08/19/2020  Amy [name] [birth year] [id number]  Visit:  3- Third Visit  Start Time:  5909 End Time:  1600 Total Minutes:  12  C   Patient Education: Education Provided: Yes Education Details: educated on sleep hygiene strategies to improve sleep quality and length Person(s) Educated: Patient Comprehension: Verbalized Understanding  Goals:  Goals Addressed            This Visit's Progress   . Patient Stated       Patient will improve independence with use of smart phone for emailing and texting images.     . patient will learn strategies to improve sleep hygiene.   On track    Reviewed environmental and internal factors that affect sleep, as well as use of the same routine when preparing for bed. Recommended:   Sleep routine:  change into bed clothes, brush teeth, wash face.  Meditate/pray/read before bed. Lighting/Sound:  use sound machine versus tv for background sound.  Open drapes/door during day to get sunlight. Exercise:  complete exercises provided by RN or walk in living room a few times per day to prep body for sleep. Address worries: to avoid racing thoughts at night, address concerns/worries earlier in the day and have an action plan for addressing these worries.  If they arise at night, remember they have been addressed.        Post Clinical Reasoning: Client Action (Goal) One Interventions: improve sleep hygiene practices for improved sleep quality Did Client Try?: No Reason Client Did Not Try?: Other Clinician View Of Client Situation:: Amy [name] is open to strategies presented by OT, however, does not seem motiviated to attempt them despite stating she is. Client View Of His/Her Situation:: continues to feel isolated due to COVID and has noted a decrease in energy as well as motiviation to complete functional tasks. Next Visit Plan:: increase independence iwth use of phone for communicating with others. Vangie Bicker, [place], OTR/L [phone]

## 2020-08-31 ENCOUNTER — Other Ambulatory Visit: Payer: Self-pay

## 2020-08-31 NOTE — Patient Instructions (Signed)
Goals Addressed            This Visit's Progress   . Patient Stated       Aging Gracefully RN goal:  07/07/2020  Patient reports she will go to between 10-1030pm and get up around 9-930 am to help get her days and nights corrected within the  next 60 days.   Interventions:         Reviewed being on a day night scheduled will help with her orientation and her medication schedule . Reviewed importance of getting enough sleep and benefits to her health.   Tomasa Rand, RN, BSN, CEN [place] Coordinator [phone]   08/13/2020  Patient reports she continues to struggle with sleep schedules.  Reports going to bed at 7am and sleeping all day.   Interventions:  encouraged and reviewed importance of good sleep patterns. Encouraged patient to try to go to bed 30 minutes to an hour earlier every night and then set alarm to get up.    Tomasa Rand, RN, BSN, CEN [place] Coordinator [phone]   08/31/2020  Assessment: patient reports she not working on goal. Reports she stays up all night and sleeps all day.  Interventions:  Reviewed with patient the ability to get back on track. Reviewed going to bed 30 minutes earlier each day and trying to stay awake during the day. Patient voiced understanding.   Tomasa Rand, RN, BSN, CEN [facility] [employer] [phone]     . Patient Stated       Aging Gracefully:  Patient will reports keeping herself busy by doing crossword puzzles or word search daily for the next 60 days.  Interventions: Reviewed with this patient how being busy will help her with her depression and keep her mind sharp. She agrees to try.  Will send in basket message to MD about change of depression medications.  Tomasa Rand, RN, BSN, CEN [place] Coordinator [phone]   08/13/20 Patient reports playing games on her phone. Encouraged memory games.  No response from MD about depression. Patient reports she will discuss at next   office visit.  Interventions: encouraged patient to make follow up appointment with Md and review difficulty with depression and sleep.   08/31/2020  Assessment: patient has not follow through to schedule appointment with MD.  Interventions: reviewed depression again and encouraged patient to go see MD to discuss depression and sleep disturbance.   Tomasa Rand, RN, BSN, CEN [facility] [employer] [phone]

## 2020-08-31 NOTE — Patient Outreach (Signed)
Aging Gracefully Program  RN Visit  08/31/2020  Amy [name] 11-Jan-1946 [id number]  Visit:   RN telephone visit #3 Cancelled home visit by patient.   Start Time:   1500 End Time:   1520 Total Minutes:   20  Readiness To Change Score:     Universal RN Interventions: Calendar Distribution: Yes Exercise Review: Yes Medications: Yes Medication Changes: Yes Mood: Yes Pain: Yes PCP Advocacy/Support: Yes Fall Prevention: Yes Clinician View Of Client Situation: Patient called to cancel home visit. Telephone visit occured. Sounds very sleepy on the phone. Client View Of His/Her Situation: Patient reports to me that she continues to not sleep at night. Instead stays up all night and sleeps all day. patient reports to me that she has a headache. States 5/10. Reports no medications changes. Reports she has not made an appointment to see PCP. Reports no dalls. States she continues to do home exercises. Denies any falls.  Healthcare Provider Communication: Healthcare Provider Response According to RN: Encouraged patient to call MD for a follow up appointment.  Clinician View of Client Situation: Clinician View Of Client Situation: Patient called to cancel home visit. Telephone visit occured. Sounds very sleepy on the phone. Client's View of His/Her Situation: Client View Of His/Her Situation: Patient reports to me that she continues to not sleep at night. Instead stays up all night and sleeps all day. patient reports to me that she has a headache. States 5/10. Reports no medications changes. Reports she has not made an appointment to see PCP. Reports no dalls. States she continues to do home exercises. Denies any falls.  Medication Assessment:patient denies any changes to medications.    OT Update: patient appears unmotivated to make improvements in health. Has cancelled numerous appointments.  Session Summary: will attempt 1 more RN visit. Reviewed with patient the importance of follow up  with PCP and getting help with sleep and depression. Patient voiced understanding.  Goals Addressed            This Visit's Progress   . Patient Stated       Aging Gracefully RN goal:  07/07/2020  Patient reports she will go to between 10-1030pm and get up around 9-930 am to help get her days and nights corrected within the  next 60 days.   Interventions:         Reviewed being on a day night scheduled will help with her orientation and her medication schedule . Reviewed importance of getting enough sleep and benefits to her health.   Tomasa Rand, RN, BSN, CEN [place] Coordinator [phone]   08/13/2020  Patient reports she continues to struggle with sleep schedules.  Reports going to bed at 7am and sleeping all day.   Interventions:  encouraged and reviewed importance of good sleep patterns. Encouraged patient to try to go to bed 30 minutes to an hour earlier every night and then set alarm to get up.    Tomasa Rand, RN, BSN, CEN [place] Coordinator [phone]   08/31/2020  Assessment: patient reports she not working on goal. Reports she stays up all night and sleeps all day.  Interventions:  Reviewed with patient the ability to get back on track. Reviewed going to bed 30 minutes earlier each day and trying to stay awake during the day. Patient voiced understanding.   Tomasa Rand, RN, BSN, CEN [facility] [employer] [phone]     . Patient Stated       Aging Gracefully:  Patient will reports keeping herself busy by doing crossword puzzles or word search daily for the next 60 days.  Interventions: Reviewed with this patient how being busy will help her with her depression and keep her mind sharp. She agrees to try.  Will send in basket message to MD about change of depression medications.  Tomasa Rand, RN, BSN, CEN [place] Coordinator [phone]   08/13/20 Patient reports playing games on her phone. Encouraged memory  games.  No response from MD about depression. Patient reports she will discuss at next  office visit.  Interventions: encouraged patient to make follow up appointment with Md and review difficulty with depression and sleep.   08/31/2020  Assessment: patient has not follow through to schedule appointment with MD.  Interventions: reviewed depression again and encouraged patient to go see MD to discuss depression and sleep disturbance.   Tomasa Rand, RN, BSN, CEN [place] Coordinator [phone]       Tomasa Rand, RN, BSN, CEN [facility] [employer] [phone]

## 2020-09-01 ENCOUNTER — Encounter: Payer: Self-pay | Admitting: Specialist

## 2020-09-03 DIAGNOSIS — J439 Emphysema, unspecified: Secondary | ICD-10-CM | POA: Diagnosis not present

## 2020-09-13 DIAGNOSIS — J439 Emphysema, unspecified: Secondary | ICD-10-CM | POA: Diagnosis not present

## 2020-09-21 DIAGNOSIS — K219 Gastro-esophageal reflux disease without esophagitis: Secondary | ICD-10-CM | POA: Diagnosis not present

## 2020-09-21 DIAGNOSIS — E7800 Pure hypercholesterolemia, unspecified: Secondary | ICD-10-CM | POA: Diagnosis not present

## 2020-09-21 DIAGNOSIS — J449 Chronic obstructive pulmonary disease, unspecified: Secondary | ICD-10-CM | POA: Diagnosis not present

## 2020-09-21 DIAGNOSIS — C3491 Malignant neoplasm of unspecified part of right bronchus or lung: Secondary | ICD-10-CM | POA: Diagnosis not present

## 2020-09-21 DIAGNOSIS — C649 Malignant neoplasm of unspecified kidney, except renal pelvis: Secondary | ICD-10-CM | POA: Diagnosis not present

## 2020-09-21 DIAGNOSIS — I10 Essential (primary) hypertension: Secondary | ICD-10-CM | POA: Diagnosis not present

## 2020-09-21 DIAGNOSIS — C50411 Malignant neoplasm of upper-outer quadrant of right female breast: Secondary | ICD-10-CM | POA: Diagnosis not present

## 2020-09-21 DIAGNOSIS — E119 Type 2 diabetes mellitus without complications: Secondary | ICD-10-CM | POA: Diagnosis not present

## 2020-09-21 DIAGNOSIS — J439 Emphysema, unspecified: Secondary | ICD-10-CM | POA: Diagnosis not present

## 2020-09-24 ENCOUNTER — Telehealth: Payer: Self-pay | Admitting: Internal Medicine

## 2020-09-24 MED ORDER — SPIRIVA RESPIMAT 1.25 MCG/ACT IN AERS: 2.0000 | INHALATION_SPRAY | Freq: Every day | RESPIRATORY_TRACT | 0 refills | Status: DC

## 2020-09-24 NOTE — Telephone Encounter (Signed)
Call made to patient, confirmed DOB. Confirmed medication. Patient made aware she is due for an appt. Appt made at 1st available with MR. 1 sample of spiriva placed upfront for pick up. Voiced understanding.   Nothing further needed at this time.

## 2020-10-01 ENCOUNTER — Ambulatory Visit: Payer: Medicare Other

## 2020-10-01 DIAGNOSIS — J439 Emphysema, unspecified: Secondary | ICD-10-CM | POA: Diagnosis not present

## 2020-10-02 ENCOUNTER — Encounter: Payer: Self-pay | Admitting: Specialist

## 2020-10-02 ENCOUNTER — Telehealth: Payer: Self-pay | Admitting: Specialist

## 2020-10-02 NOTE — Patient Outreach (Signed)
OT called patient on 10/01/20 to remind patient of AG OT visit on 10/02/20.  Patient returned call this am and requested that visit be rescheduled as she has some things to attend to.  Requested that OT call early next week to reschedule. Vangie Bicker, [place], OTR/L [phone]

## 2020-10-08 ENCOUNTER — Other Ambulatory Visit: Payer: Self-pay

## 2020-10-08 ENCOUNTER — Other Ambulatory Visit: Payer: Self-pay | Admitting: Specialist

## 2020-10-08 NOTE — Patient Outreach (Signed)
Aging Gracefully Program  OT FINAL Visit  10/08/2020  Amy [name] 17-Dec-1945 [id number]  Visit:  4- Fourth Visit (dc visit, completed telephonically)  Start Time:  1130 End Time:  1145 Total Minutes:  15   Readiness to Change:  Readiness to Change Score: 5.67     Patient Education: Education Provided: Yes Education Details: educated on use of handouts to add email to phone, add a photo to a text message, and take a screenshot for her samsung phone, a handout on the [employer] program, and use ot "Tips for Aging in Place" book. Person(s) Educated: Patient Comprehension: Verbalized Understanding  Goals: Goals Addressed              This Visit's Progress   .  COMPLETED: Paitent will learn and apply energy conservation techniques to improve her endurance and independence with daily tasks. (pt-stated)   On track     ACTION PLANNING - CUSTOM  Target Problem Area:  decreased energy level when completing daily tasks   Why Problem May Occur: Breathing concerns/O2 level Decreased activity levels due to COVID   Target Goal:  learn and apply energy conservation techniques to apply to everyday routines.    STRATEGIES Saving Your Energy: DO: DON'T:  priortize   pace             Modifying your home environment and making it safe: DO: DON'T:  plan                Simplifying the way you set up tasks or daily routines: DO: DON'T:  position           Practice It is important to practice the strategies so we can determine if they will be effective in helping to reach your goal. Follow these specific recommendations: Remember and Practice the 4 Ps 1. Prioritize Decide what needs to be done today and what can be done at a later date or time. For example, going to a doctor's appointment would take priority over dusting the living room. When you have more than one thing to do, begin with the most important to make sure it gets done. 2. Plan Plan your  activities first to avoid extra trips. Gather the supplies and equipment you need before doing the job. For example, get your garden supplies and tools ready before you start to plant flowers. Plan to alternate heavy and light tasks. Plan activities throughout the week to avoid doing too many activities in one day. Put a schedule on the refrigerator to remind you and others who is doing what. Plan to get a good rest each night. Use family and friends or pay for help to complete tasks you may struggle with or that require too much energy. 3. Pace Maintain a slow and steady pace. Never rush.  2 3. Pace (continued) Rest often. Rest before you feel tired. Avoid holding your breath. Practice breathing slow and steady. Use pursed lip breathing. Breathe in through your nose for a count of 2 and out from your mouth for a count of 4. This is like blowing out a candle on a cake. Remember that you may have to ask for help to do some tasks and that is okay. Listen to your body and know your limits. 4. Position Too much bending and reaching can cause fatigue and shortness of breath. Use a reacher, sock aid, long handled shoe horn and/or elastic shoelaces. Avoid bending and reaching too much. Always maintain a nice upright posture when  sitting and standing. This helps you get more oxygen into your lungs and around your body to work better. Sit when you can. Sitting supports your body so you can focus on your breathing and activities while conserving your energy. Sitting reduces energy use by 25%. If a strategy does not work the first time, try it again and again (and maybe again). We may make some changes over the next few sessions, based on how they work.  Vangie Bicker, [place], OTR/L [phone]       .  COMPLETED: Patient Stated   On track     Patient will improve independence with use of smart phone for emailing and texting images.   As I stated on the phone, I am sending you a  few handouts on how to download your email to your phone, as well as attach pictures to emails, and take screenshots.    There is also a handout on a program called [employer].  If you call the number on the flyer they can help you with anything "techy". The "Tips for Aging in Place" book will help you problem solve how to be as independent as possible with all of your daily activities, so that you can continue to be safe and independent in your own home.  If you have any questions on anything that we discussed today, please don't hesitate to give me a call!    .  COMPLETED: patient will improve independence with light cleaning tasks. (pt-stated)        Did not address specifically, however did address with energy conservation technique visit.    .  COMPLETED: patient will learn strategies to improve sleep hygiene.   On track     Reviewed environmental and internal factors that affect sleep, as well as use of the same routine when preparing for bed. Recommended:   Sleep routine:  change into bed clothes, brush teeth, wash face.  Meditate/pray/read before bed. Lighting/Sound:  use sound machine versus tv for background sound.  Open drapes/door during day to get sunlight. Exercise:  complete exercises provided by RN or walk in living room a few times per day to prep body for sleep. Address worries: to avoid racing thoughts at night, address concerns/worries earlier in the day and have an action plan for addressing these worries.  If they arise at night, remember they have been addressed.      Phone use: As I stated on the phone, I am sending you a few handouts on how to download your email to your phone, as well as attach pictures to emails, and take screenshots.    There is also a handout on a program called [employer].  If you call the number on the flyer they can help you with anything "techy". The "Tips for Aging in Place" book will help you problem solve how to be as independent as  possible with all of your daily activities, so that you can continue to be safe and independent in your own home.  If you have any questions on anything that we discussed today, please don't hesitate to give me a call!  Post Clinical Reasoning: Clinician View Of Client Situation:: Vaniyah states she has used a few of the techniques that I provided to her. Client View Of His/Her Situation:: now that her TV is broke she can sleep better - read over her energy conservation techniques handout today. she feels exercises were helpful. Next Visit Plan:: dc from [place] OT visits this date -  patient feels all goals have been met. Vangie Bicker, [place], OTR/L [phone]

## 2020-10-08 NOTE — Patient Outreach (Signed)
Aging Gracefully Program  RN Visit  10/08/2020  EULALAH RUPERT Jul 04, 1946 [id number]  Visit:   RN visit #4 ( telephone visit)  Start Time:   1030 End Time:   4098 Total Minutes:   15  Readiness To Change Score:     Universal RN Interventions: Calendar Distribution: Yes Exercise Review: Yes Medications: Yes Medication Changes: Yes Mood: Yes Pain: Yes PCP Advocacy/Support: Yes Fall Prevention: Yes Incontinence: Yes Clinician View Of Client Situation: Patient reached via phone. [employer] completing modifications today. Client View Of His/Her Situation: Patient reports to me that she is doing better. Reports that her TV in the bedroom went out and now she has nothing to do when she goes to her bedroom but sleep. Reports she is on a better sleep schedule now.  Reports she did go to the grocery store in person to get out of the house. Reports to me that her family has been fussing at her for staying home so much.   Reports no pain and no changes in medications. Denies any recent falls. Reports she is doing her home exercises.  Reports she has follow up planned with PCP in May after her mammogram.  Healthcare Provider Communication:    Clinician View of Client Situation: Clinician View Of Client Situation: Patient reached via phone. [employer] completing modifications today. Client's View of His/Her Situation: Client View Of His/Her Situation: Patient reports to me that she is doing better. Reports that her TV in the bedroom went out and now she has nothing to do when she goes to her bedroom but sleep. Reports she is on a better sleep schedule now.  Reports she did go to the grocery store in person to get out of the house. Reports to me that her family has been fussing at her for staying home so much.   Reports no pain and no changes in medications. Denies any recent falls. Reports she is doing her home exercises.  Reports she has follow up planned with  PCP in May after her mammogram.  Medication Assessment: denies any changes in medications    OT Update: Nursing visits completed/ Email to OT. Home modifications to be completed at the home today.   Session Summary: Patient reports she is doing better. Reports improved sleep pattern. Denies falls or pain.   Goals Addressed            This Visit's Progress   . COMPLETED: Patient Stated       Aging Gracefully RN goal:  07/07/2020  Patient reports she will go to between 10-1030pm and get up around 9-930 am to help get her days and nights corrected within the  next 60 days.   Interventions:         Reviewed being on a day night scheduled will help with her orientation and her medication schedule . Reviewed importance of getting enough sleep and benefits to her health.   Tomasa Rand, RN, BSN, CEN [place] Coordinator [phone]   08/13/2020  Patient reports she continues to struggle with sleep schedules.  Reports going to bed at 7am and sleeping all day.   Interventions:  encouraged and reviewed importance of good sleep patterns. Encouraged patient to try to go to bed 30 minutes to an hour earlier every night and then set alarm to get up.    Tomasa Rand, RN, BSN, CEN [place] Coordinator [phone]   08/31/2020  Assessment: patient reports she not working on goal. Reports  she stays up all night and sleeps all day.  Interventions:  Reviewed with patient the ability to get back on track. Reviewed going to bed 30 minutes earlier each day and trying to stay awake during the day. Patient voiced understanding.   Tomasa Rand, RN, BSN, CEN [place] Coordinator [phone]   10/08/2020 Assessment: reports sleeping schedule is better and she continues to try to get up earlier and go to bed earlier.  Interventions: Encouraged patient to discuss with MD at next office visit.  Tomasa Rand, RN, BSN, CEN [facility] [employer] [phone]     .  COMPLETED: Patient Stated       Aging Gracefully:  Patient will reports keeping herself busy by doing crossword puzzles or word search daily for the next 60 days.  Interventions: Reviewed with this patient how being busy will help her with her depression and keep her mind sharp. She agrees to try.  Will send in basket message to MD about change of depression medications.  Tomasa Rand, RN, BSN, CEN [place] Coordinator [phone]   08/13/20 Patient reports playing games on her phone. Encouraged memory games.  No response from MD about depression. Patient reports she will discuss at next  office visit.  Interventions: encouraged patient to make follow up appointment with Md and review difficulty with depression and sleep.   08/31/2020  Assessment: patient has not follow through to schedule appointment with MD.  Interventions: reviewed depression again and encouraged patient to go see MD to discuss depression and sleep disturbance.   Tomasa Rand, RN, BSN, CEN [place] Coordinator [phone]   10/08/2020  Assessment: Patient reports she is doing much better. Reports go to the store in person.  Interventions:  encouraged patient to continue to be active and socialize.  Reviewed importance of patient talking to MD about depression.  Tomasa Rand, RN, BSN, CEN [place] Coordinator [phone]      Tomasa Rand, RN, BSN, CEN [facility] [employer] [phone]

## 2020-10-08 NOTE — Patient Instructions (Signed)
Goals Addressed            This Visit's Progress   . COMPLETED: Patient Stated       Aging Gracefully RN goal:  07/07/2020  Patient reports she will go to between 10-1030pm and get up around 9-930 am to help get her days and nights corrected within the  next 60 days.   Interventions:         Reviewed being on a day night scheduled will help with her orientation and her medication schedule . Reviewed importance of getting enough sleep and benefits to her health.   Tomasa Rand, RN, BSN, CEN [place] Coordinator [phone]   08/13/2020  Patient reports she continues to struggle with sleep schedules.  Reports going to bed at 7am and sleeping all day.   Interventions:  encouraged and reviewed importance of good sleep patterns. Encouraged patient to try to go to bed 30 minutes to an hour earlier every night and then set alarm to get up.    Tomasa Rand, RN, BSN, CEN [place] Coordinator [phone]   08/31/2020  Assessment: patient reports she not working on goal. Reports she stays up all night and sleeps all day.  Interventions:  Reviewed with patient the ability to get back on track. Reviewed going to bed 30 minutes earlier each day and trying to stay awake during the day. Patient voiced understanding.   Tomasa Rand, RN, BSN, CEN [place] Coordinator [phone]   10/08/2020 Assessment: reports sleeping schedule is better and she continues to try to get up earlier and go to bed earlier.  Interventions: Encouraged patient to discuss with MD at next office visit.  Tomasa Rand, RN, BSN, CEN [facility] [employer] [phone]     . COMPLETED: Patient Stated       Aging Gracefully:  Patient will reports keeping herself busy by doing crossword puzzles or word search daily for the next 60 days.  Interventions: Reviewed with this patient how being busy will help her with her depression and keep her mind sharp. She agrees to try.  Will send  in basket message to MD about change of depression medications.  Tomasa Rand, RN, BSN, CEN [place] Coordinator [phone]   08/13/20 Patient reports playing games on her phone. Encouraged memory games.  No response from MD about depression. Patient reports she will discuss at next  office visit.  Interventions: encouraged patient to make follow up appointment with Md and review difficulty with depression and sleep.   08/31/2020  Assessment: patient has not follow through to schedule appointment with MD.  Interventions: reviewed depression again and encouraged patient to go see MD to discuss depression and sleep disturbance.   Tomasa Rand, RN, BSN, CEN [place] Coordinator [phone]   10/08/2020  Assessment: Patient reports she is doing much better. Reports go to the store in person.  Interventions:  encouraged patient to continue to be active and socialize.  Reviewed importance of patient talking to MD about depression.  Tomasa Rand, RN, BSN, CEN [facility] [employer] [phone]

## 2020-10-11 DIAGNOSIS — J439 Emphysema, unspecified: Secondary | ICD-10-CM | POA: Diagnosis not present

## 2020-10-20 ENCOUNTER — Other Ambulatory Visit: Payer: Self-pay

## 2020-10-20 NOTE — Patient Outreach (Signed)
Aging Gracefully Program  10/20/2020  Amy [name] 1945/10/14 [id number]  [facility] Evaluation Interviewer made contact with patient. Aging Gracefully 5 month survey completed.   Interviewer will send follow up email to Darylene Price at [facility]. [place] Management Assistant [phone]

## 2020-10-22 ENCOUNTER — Encounter: Payer: Self-pay | Admitting: Gastroenterology

## 2020-10-22 DIAGNOSIS — J449 Chronic obstructive pulmonary disease, unspecified: Secondary | ICD-10-CM | POA: Diagnosis not present

## 2020-10-22 DIAGNOSIS — J441 Chronic obstructive pulmonary disease with (acute) exacerbation: Secondary | ICD-10-CM | POA: Diagnosis not present

## 2020-10-22 DIAGNOSIS — K219 Gastro-esophageal reflux disease without esophagitis: Secondary | ICD-10-CM | POA: Diagnosis not present

## 2020-10-22 DIAGNOSIS — E7800 Pure hypercholesterolemia, unspecified: Secondary | ICD-10-CM | POA: Diagnosis not present

## 2020-10-22 DIAGNOSIS — E1169 Type 2 diabetes mellitus with other specified complication: Secondary | ICD-10-CM | POA: Diagnosis not present

## 2020-10-22 DIAGNOSIS — I10 Essential (primary) hypertension: Secondary | ICD-10-CM | POA: Diagnosis not present

## 2020-10-22 DIAGNOSIS — E1122 Type 2 diabetes mellitus with diabetic chronic kidney disease: Secondary | ICD-10-CM | POA: Diagnosis not present

## 2020-10-22 DIAGNOSIS — J439 Emphysema, unspecified: Secondary | ICD-10-CM | POA: Diagnosis not present

## 2020-10-22 DIAGNOSIS — E119 Type 2 diabetes mellitus without complications: Secondary | ICD-10-CM | POA: Diagnosis not present

## 2020-11-01 DIAGNOSIS — J439 Emphysema, unspecified: Secondary | ICD-10-CM | POA: Diagnosis not present

## 2020-11-04 DIAGNOSIS — E7800 Pure hypercholesterolemia, unspecified: Secondary | ICD-10-CM | POA: Diagnosis not present

## 2020-11-04 DIAGNOSIS — J449 Chronic obstructive pulmonary disease, unspecified: Secondary | ICD-10-CM | POA: Diagnosis not present

## 2020-11-04 DIAGNOSIS — E119 Type 2 diabetes mellitus without complications: Secondary | ICD-10-CM | POA: Diagnosis not present

## 2020-11-04 DIAGNOSIS — J439 Emphysema, unspecified: Secondary | ICD-10-CM | POA: Diagnosis not present

## 2020-11-04 DIAGNOSIS — I10 Essential (primary) hypertension: Secondary | ICD-10-CM | POA: Diagnosis not present

## 2020-11-04 DIAGNOSIS — C50411 Malignant neoplasm of upper-outer quadrant of right female breast: Secondary | ICD-10-CM | POA: Diagnosis not present

## 2020-11-04 DIAGNOSIS — Z85038 Personal history of other malignant neoplasm of large intestine: Secondary | ICD-10-CM | POA: Diagnosis not present

## 2020-11-04 DIAGNOSIS — C649 Malignant neoplasm of unspecified kidney, except renal pelvis: Secondary | ICD-10-CM | POA: Diagnosis not present

## 2020-11-04 DIAGNOSIS — C3491 Malignant neoplasm of unspecified part of right bronchus or lung: Secondary | ICD-10-CM | POA: Diagnosis not present

## 2020-11-11 DIAGNOSIS — J439 Emphysema, unspecified: Secondary | ICD-10-CM | POA: Diagnosis not present

## 2020-11-19 ENCOUNTER — Ambulatory Visit: Payer: Self-pay | Admitting: Internal Medicine

## 2020-11-24 ENCOUNTER — Ambulatory Visit: Payer: Medicare Other

## 2020-11-27 ENCOUNTER — Telehealth: Payer: Self-pay | Admitting: Internal Medicine

## 2020-11-27 NOTE — Telephone Encounter (Signed)
I have filled out the handicap placard and placed this in MR sign folder to complete when he comes back in the office.

## 2020-11-27 NOTE — Telephone Encounter (Signed)
Ok to sign one

## 2020-11-27 NOTE — Telephone Encounter (Signed)
Spoke with the pt  She states that she mailed [place] a handicap placard form with her portion filled out approx 1 month ago  She is asking if we received this  I do not see in B pod or up front MR- have you seen this? If not are you okay signing one if we fill out a new copy? Thanks!

## 2020-11-30 NOTE — Telephone Encounter (Signed)
MR will be in office tomorrow 5/10 in the afternoon. Will give to him then to sign and then will call pt to see if she wants to have it mailed to her or if she wants to come by office to pick it up.

## 2020-12-01 DIAGNOSIS — J439 Emphysema, unspecified: Secondary | ICD-10-CM | POA: Diagnosis not present

## 2020-12-03 NOTE — Telephone Encounter (Signed)
Per Adalberto Cole, CMA pt requested to have placard placed in mail. Application has been placed in mailed at pt's request. Nothing further needed.

## 2020-12-03 NOTE — Telephone Encounter (Signed)
I called and spoke with patient about placard. She is wanting to be mailed to her. Notified patient that we will mail it and to call if anything else is needed. Patient verbalized understanding, nothing further needed. Sent emily a message as she has the form and will mail it.

## 2020-12-03 NOTE — Telephone Encounter (Signed)
Placard application has been signed by MR and filled out for pt. Attempted to call pt to see if she wanted to have the application mailed to her or placed up front for her to come by and pick up but unable to reach pt. Left message for her to return call.

## 2020-12-06 ENCOUNTER — Encounter (HOSPITAL_COMMUNITY): Payer: Self-pay

## 2020-12-06 ENCOUNTER — Emergency Department (HOSPITAL_COMMUNITY)
Admission: EM | Admit: 2020-12-06 | Discharge: 2020-12-06 | Disposition: A | Discharge status: Home / Self Care | Payer: Medicare Other | Attending: Emergency Medicine | Admitting: Emergency Medicine

## 2020-12-06 ENCOUNTER — Other Ambulatory Visit: Payer: Self-pay

## 2020-12-06 DIAGNOSIS — E1122 Type 2 diabetes mellitus with diabetic chronic kidney disease: Secondary | ICD-10-CM | POA: Diagnosis not present

## 2020-12-06 DIAGNOSIS — N189 Chronic kidney disease, unspecified: Secondary | ICD-10-CM | POA: Insufficient documentation

## 2020-12-06 DIAGNOSIS — Z7952 Long term (current) use of systemic steroids: Secondary | ICD-10-CM | POA: Diagnosis not present

## 2020-12-06 DIAGNOSIS — R197 Diarrhea, unspecified: Secondary | ICD-10-CM | POA: Diagnosis not present

## 2020-12-06 DIAGNOSIS — Z91040 Latex allergy status: Secondary | ICD-10-CM | POA: Diagnosis not present

## 2020-12-06 DIAGNOSIS — R110 Nausea: Secondary | ICD-10-CM | POA: Diagnosis not present

## 2020-12-06 DIAGNOSIS — I129 Hypertensive chronic kidney disease with stage 1 through stage 4 chronic kidney disease, or unspecified chronic kidney disease: Secondary | ICD-10-CM | POA: Insufficient documentation

## 2020-12-06 DIAGNOSIS — Z9861 Coronary angioplasty status: Secondary | ICD-10-CM | POA: Diagnosis not present

## 2020-12-06 DIAGNOSIS — Z79899 Other long term (current) drug therapy: Secondary | ICD-10-CM | POA: Diagnosis not present

## 2020-12-06 DIAGNOSIS — Z85118 Personal history of other malignant neoplasm of bronchus and lung: Secondary | ICD-10-CM | POA: Diagnosis not present

## 2020-12-06 DIAGNOSIS — Z85038 Personal history of other malignant neoplasm of large intestine: Secondary | ICD-10-CM | POA: Diagnosis not present

## 2020-12-06 DIAGNOSIS — Z853 Personal history of malignant neoplasm of breast: Secondary | ICD-10-CM | POA: Diagnosis not present

## 2020-12-06 DIAGNOSIS — J441 Chronic obstructive pulmonary disease with (acute) exacerbation: Secondary | ICD-10-CM | POA: Diagnosis not present

## 2020-12-06 DIAGNOSIS — R6889 Other general symptoms and signs: Secondary | ICD-10-CM | POA: Diagnosis not present

## 2020-12-06 DIAGNOSIS — R509 Fever, unspecified: Secondary | ICD-10-CM | POA: Diagnosis not present

## 2020-12-06 DIAGNOSIS — Z87891 Personal history of nicotine dependence: Secondary | ICD-10-CM | POA: Diagnosis not present

## 2020-12-06 DIAGNOSIS — Z7984 Long term (current) use of oral hypoglycemic drugs: Secondary | ICD-10-CM | POA: Diagnosis not present

## 2020-12-06 DIAGNOSIS — Z743 Need for continuous supervision: Secondary | ICD-10-CM | POA: Diagnosis not present

## 2020-12-06 LAB — TYPE AND SCREEN
ABO/RH(D): B POS
Antibody Screen: NEGATIVE

## 2020-12-06 LAB — CBC WITH DIFFERENTIAL/PLATELET
Abs Immature Granulocytes: 0.02 10*3/uL (ref 0.00–0.07)
Basophils Absolute: 0 10*3/uL (ref 0.0–0.1)
Basophils Relative: 0 %
Eosinophils Absolute: 0.1 10*3/uL (ref 0.0–0.5)
Eosinophils Relative: 1 %
HCT: 38.3 % (ref 36.0–46.0)
Hemoglobin: 11.9 g/dL — ABNORMAL LOW (ref 12.0–15.0)
Immature Granulocytes: 0 %
Lymphocytes Relative: 34 %
Lymphs Abs: 2 10*3/uL (ref 0.7–4.0)
MCH: 28.5 pg (ref 26.0–34.0)
MCHC: 31.1 g/dL (ref 30.0–36.0)
MCV: 91.6 fL (ref 80.0–100.0)
Monocytes Absolute: 0.4 10*3/uL (ref 0.1–1.0)
Monocytes Relative: 6 %
Neutro Abs: 3.3 10*3/uL (ref 1.7–7.7)
Neutrophils Relative %: 59 %
Platelets: 164 10*3/uL (ref 150–400)
RBC: 4.18 MIL/uL (ref 3.87–5.11)
RDW: 14.8 % (ref 11.5–15.5)
WBC: 5.8 10*3/uL (ref 4.0–10.5)
nRBC: 0 % (ref 0.0–0.2)

## 2020-12-06 LAB — LIPASE, BLOOD: Lipase: 34 U/L (ref 11–51)

## 2020-12-06 LAB — PROTIME-INR
INR: 1 (ref 0.8–1.2)
Prothrombin Time: 13.4 seconds (ref 11.4–15.2)

## 2020-12-06 LAB — COMPREHENSIVE METABOLIC PANEL
ALT: 12 U/L (ref 0–44)
AST: 17 U/L (ref 15–41)
Albumin: 4.2 g/dL (ref 3.5–5.0)
Alkaline Phosphatase: 49 U/L (ref 38–126)
Anion gap: 8 (ref 5–15)
BUN: 15 mg/dL (ref 8–23)
CO2: 41 mmol/L — ABNORMAL HIGH (ref 22–32)
Calcium: 9.9 mg/dL (ref 8.9–10.3)
Chloride: 92 mmol/L — ABNORMAL LOW (ref 98–111)
Creatinine, Ser: 0.83 mg/dL (ref 0.44–1.00)
GFR, Estimated: >60 mL/min (ref >60)
Glucose, Bld: 123 mg/dL — ABNORMAL HIGH (ref 70–99)
Potassium: 3.6 mmol/L (ref 3.5–5.1)
Sodium: 141 mmol/L (ref 135–145)
Total Bilirubin: 1 mg/dL (ref 0.3–1.2)
Total Protein: 7.5 g/dL (ref 6.5–8.1)

## 2020-12-06 LAB — POC OCCULT BLOOD, ED: Fecal Occult Bld: NEGATIVE

## 2020-12-06 MED ORDER — SODIUM CHLORIDE 0.9 % IV SOLN: INTRAVENOUS | Status: DC

## 2020-12-06 MED ORDER — ACETAMINOPHEN 500 MG PO TABS
1000.0000 mg | ORAL_TABLET | Freq: Once | ORAL | Status: AC
  Administered 2020-12-06: 1000 mg via ORAL
  Filled 2020-12-06: qty 2

## 2020-12-06 NOTE — ED Triage Notes (Signed)
[name] from home. Has been having GI/ diarrhea issues the past week. Patient said she had 82 F fever earlier this week. Tonight her diarrhea was dark black. No chest pain. Has a little bit of nausea.   EMS vitals 173/102 HR 68 RR 14 100% 2L baseline CBG 144

## 2020-12-06 NOTE — ED Provider Notes (Signed)
[facility] DEPT Provider Note  CSN: [id number] Arrival date & time: 12/06/20 3212  Chief Complaint(s) Diarrhea  HPI Amy [name] is a 75 y.o. female with a past medical history listed below here for 1 week of diarrhea. Patient reported fever of 101 earlier this week but resolved. Endorsed nausea without emesis. Reports that the diarrhea turned a tarry black color today. Denies any anticoagulation. Reports that she takes iron supplements. No alleviating or aggravating factors. Denies any chest pain or shortness of breath. No abdominal pain. No dysuria. No other physical complaints. No suspicious food intake. No recent antibiotics.  HPI  Past Medical History Past Medical History:  Diagnosis Date  . Allergy   . Anemia   . Anxiety   . Arthritis   . Blood dyscrasia    Sickle Cell traits  . Blood transfusion without reported diagnosis 1999   prbc S/P COLON SURGERY  . Breast cancer, right breast ([place]) 10/03/12  . Cancer ([place])    colon,renal,lung  . Cataract    NO SURGERY  . Chronic kidney disease 1999   RIGHT KIDNEY REMOVED  . Colon cancer ([place]) 1999, 2013  . Depression   . Diabetes mellitus    diet controlled  . GERD (gastroesophageal reflux disease)   . H/O: lung cancer   . Heart murmur   . Hernia    Near umbilicus  . History of colon cancer   . History of kidney cancer   . Hot flashes    5 yr pill for breast CA causing hot flashes  . HX: breast cancer 2014   --right breast  . Hypertension    Does not see a cardiologist  . Internal hemorrhoids   . Interstitial lung disease ([place]) 11/25/2015  . Lung cancer ([place]) 2001  . Neuromuscular disorder ([place])    PERIPHERAL NEUROPATHY FEET  . Shortness of breath    with exertion   . Sickle cell trait ([place])   . Tubular adenoma of colon   . Ulcer    HX YEARS AGO   Patient Active Problem List   Diagnosis Date Noted  . Chronic respiratory failure with hypoxia ([place]) 01/26/2017  . DM  (diabetes mellitus), type 2 ([place]) 12/12/2016  . COPD exacerbation ([place]) 12/08/2016  . Interstitial lung disease ([place]) 11/25/2015  . Smoking history 10/27/2015  . Other emphysema ([place]) 10/27/2015  . Shortness of breath 10/27/2015  . Chronic cough 10/27/2015  . Pulmonary hypertension ([place]) 10/27/2015  . Atypical lobular hyperplasia of breast 12/09/2014  . Personal history of colon cancer 09/24/2014  . History of lung cancer 09/24/2014  . History of kidney cancer 09/24/2014  . Breast cancer of upper-outer quadrant of right female breast ([place])   . Colon cancer ([place])   . Incisional hernia 06/02/2012  . 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 Renal cell cancer ([place]) 04/20/2012  . COLONIC POLYPS, ADENOMATOUS 12/04/2007   Home Medication(s) Prior to Admission medications   Medication Sig Start Date End Date Taking? Authorizing Provider  acetaminophen (TYLENOL) 500 MG tablet Take 1,000 mg by mouth every 6 (six) hours as needed (pain).    [provider]  Albuterol Sulfate (PROAIR RESPICLICK) 248 (90 Base) MCG/ACT AEPB Inhale 2 puffs into the lungs every 8 (eight) hours as needed (wheezing/shortness of breath). 08/01/19   Brand Males, MD  ALPRAZolam Duanne Moron) 1 MG tablet Take 0.5 mg by mouth at bedtime.    [provider]  Ascorbic Acid (VITAMIN C WITH ROSE  HIPS) 500 MG tablet Take 500 mg by mouth daily.    [provider]  atorvastatin (LIPITOR) 20 MG tablet Take 20 mg by mouth daily.  03/08/12   [provider]  budesonide-formoterol (SYMBICORT) 160-4.5 MCG/ACT inhaler Inhale 2 puffs into the lungs 2 (two) times daily. 05/20/19   Brand Males, MD  Calcium Carbonate-Vitamin D (CALCIUM-VITAMIN D) 500-200 MG-UNIT per tablet Take 1 tablet by mouth every Monday, Wednesday, and Friday.     [provider]  Ferrous Gluconate (IRON) 240 (27 FE) MG TABS Take 1 tablet by mouth every morning.    [provider]   hydrochlorothiazide 25 MG tablet Take 25 mg by mouth daily before breakfast.  01/03/11   [provider]  losartan (COZAAR) 50 MG tablet Take 50 mg by mouth daily.    [provider]  metFORMIN (GLUCOPHAGE) 500 MG tablet Take 500 mg by mouth daily with breakfast.  12/26/12   [provider]  metoprolol (LOPRESSOR) 100 MG tablet Take 100 mg by mouth daily before breakfast.  01/03/11   [provider]  omeprazole (PRILOSEC) 20 MG capsule Take 20 mg by mouth daily before breakfast.    [provider]  OXYGEN Inhale into the lungs. Use 2L continuously    [provider]  PARoxetine (PAXIL) 40 MG tablet Take 40 mg by mouth daily before breakfast.  12/22/10   [provider]  Tiotropium Bromide Monohydrate (SPIRIVA RESPIMAT) 1.25 MCG/ACT AERS Inhale 2 puffs into the lungs daily. 09/24/20 10/24/20  Brand Males, MD  traMADol (ULTRAM) 50 MG tablet Take 50 mg by mouth every 12 (twelve) hours as needed (pain). 12/27/10   [provider]                                                                                                                                    Past Surgical History Past Surgical History:  Procedure Laterality Date  . ABDOMINAL HYSTERECTOMY  80's   fibroids  . BIOPSY  07/02/2018   Procedure: BIOPSY;  Surgeon: Ladene Artist, MD;  Location: [facility];  Service: Endoscopy;;  . BREAST BIOPSY     Bilateral cysts/benign  . BREAST BIOPSY Right 10/03/12   ER/PR +  . BREAST BIOPSY Right 12/20/2012   Procedure: Right breast Excisional Biopsy;  Surgeon: Rolm Bookbinder, MD;  Location: [facility];  Service: General;  Laterality: Right;  . BREAST LUMPECTOMY    . BREAST LUMPECTOMY WITH NEEDLE LOCALIZATION Right 12/20/2012   Procedure: BREAST LUMPECTOMY WITH NEEDLE LOCALIZATION;  Surgeon: Rolm Bookbinder, MD;  Location: [facility];  Service: General;  Laterality: Right;  . BREAST LUMPECTOMY WITH RADIOACTIVE SEED LOCALIZATION Right  01/19/2015   Procedure: RIGHT BREAST LUMPECTOMY WITH RADIOACTIVE SEED LOCALIZATION;  Surgeon: Rolm Bookbinder, MD;  Location: [facility];  Service: General;  Laterality: Right;  . CARDIAC CATHETERIZATION N/A 03/02/2016   Procedure: Right Heart Cath;  Surgeon: Quillian Quince  R Bensimhon, MD;  Location: [facility] CV LAB;  Service: Cardiovascular;  Laterality: N/A;  . CATARACT EXTRACTION, BILATERAL  01/2018 &02/2018  . COLECTOMY  05-31-2012   laparoscopic ,possible open  . COLON SURGERY     chemo  . COLONOSCOPY    . COLONOSCOPY WITH PROPOFOL N/A 07/02/2018   Procedure: COLONOSCOPY WITH PROPOFOL;  Surgeon: Ladene Artist, MD;  [facility];  Service: Endoscopy;  Laterality: N/A;  . [place]   right/chemo kidney removed for cancer  . LUNG LOBECTOMY     right lower  . PARTIAL COLECTOMY  05/31/2012   Procedure: PARTIAL COLECTOMY;  Surgeon: [place]m Bookbinder, MD;  [facility];  Service: General;  Laterality: N/A;  . PARTIAL HYSTERECTOMY    . POLYPECTOMY    . POLYPECTOMY  07/02/2018   Procedure: POLYPECTOMY;  Surgeon: Ladene Artist, MD;  Location: [facility];  Service: Endoscopy;;  . RE-EXCISION OF BREAST CANCER,SUPERIOR MARGINS Right 01/21/2013   Procedure: RE-EXCISION OF BREAST CANCER,SUPERIOR MARGINS;  Surgeon: [place]m Bookbinder, MD;  [facility];  Service: General;  Laterality: Right;  . TONSILLECTOMY     as child  . TUBAL LIGATION     Family History Family History  Problem Relation Age of Onset  . Colon polyps Brother   . Multiple myeloma Brother 60  . Breast cancer Mother        dx in her 22s  . Heart disease Father   . Colon cancer Paternal Aunt   . Breast cancer Maternal Grandmother   . Colon cancer Cousin        2 paternal first cousins  . Leukemia Cousin 11  . Esophageal cancer Neg Hx   . Rectal cancer Neg Hx   . Stomach cancer Neg Hx     Social History Social History   Tobacco Use  . Smoking status: Former Smoker     Packs/day: 1.25    Years: 21.00    Pack years: 26.25    Types: Cigarettes    Start date: 10/30/1992    Quit date: 11/20/2013    Years since quitting: 7.0  . Smokeless tobacco: Never Used  . Tobacco comment: quit then started up again 2years ago--smokes 1pack every 2-3 days  Vaping Use  . Vaping Use: Never used  Substance Use Topics  . Alcohol use: No    Alcohol/week: 0.0 standard drinks  . Drug use: No   Allergies Crab [shellfish allergy], Iodine, Albuterol-ipratropium [ipratropium-albuterol], Incruse ellipta [umeclidinium bromide], Iohexol, Lactose intolerance (gi), Adhesive [tape], and Latex  Review of Systems Review of Systems All other systems are reviewed and are negative for acute change except as noted in the HPI  Physical Exam Vital Signs  I have reviewed the triage vital signs BP (!) 150/75   Pulse 70   Temp 98.2 F (36.8 C) (Oral)   Resp 19   Ht 5' 5"  (1.651 m)   Wt 113.4 kg   LMP 07/26/1983   SpO2 100%   BMI 41.60 kg/m   Physical Exam Vitals reviewed.  Constitutional:      General: She is not in acute distress.    Appearance: She is well-developed. She is not diaphoretic.  HENT:     Head: Normocephalic and atraumatic.     Right Ear: External ear normal.     Left Ear: External ear normal.     Nose: Nose normal.  Eyes:     General: No scleral icterus.    Conjunctiva/sclera: Conjunctivae normal.  Neck:     Trachea: Phonation normal.  Cardiovascular:     Rate and Rhythm: Normal rate and regular rhythm.  Pulmonary:     Effort: Pulmonary effort is normal. No respiratory distress.     Breath sounds: No stridor.  Abdominal:     General: There is no distension.     Tenderness: There is no abdominal tenderness. There is no guarding or rebound.  Musculoskeletal:        General: Normal range of motion.     Cervical back: Normal range of motion.  Neurological:     Mental Status: She is alert and oriented to person, place, and time.  Psychiatric:         Behavior: Behavior normal.     ED Results and Treatments Labs (all labs ordered are listed, but only abnormal results are displayed) Labs Reviewed  COMPREHENSIVE METABOLIC PANEL - Abnormal; Notable for the following components:      Result Value   Chloride 92 (*)    CO2 41 (*)    Glucose, Bld 123 (*)    All other components within normal limits  CBC WITH DIFFERENTIAL/PLATELET - Abnormal; Notable for the following components:   Hemoglobin 11.9 (*)    All other components within normal limits  PROTIME-INR  LIPASE, BLOOD  POC OCCULT BLOOD, ED  TYPE AND SCREEN                                                                                                                         EKG  EKG Interpretation  Date/Time:    Ventricular Rate:    PR Interval:    QRS Duration:   QT Interval:    QTC Calculation:   R Axis:     Text Interpretation:        Radiology No results found.  Pertinent labs & imaging results that were available during my care of the patient were reviewed by me and considered in my medical decision making (see chart for details).  Medications Ordered in ED Medications  0.9 %  sodium chloride infusion ( Intravenous New Bag/Given 12/06/20 0419)                                                                                                                                    Procedures Procedures  (including critical care time)  Medical Decision Making / ED Course I have reviewed  the nursing notes for this encounter and the patient's prior records (if available in EHR or on provided paperwork).   Amy [name] was evaluated in Emergency Department on 12/06/2020 for the symptoms described in the history of present illness. She was evaluated in the context of the global COVID-19 pandemic, which necessitated consideration that the patient might be at risk for infection with the SARS-CoV-2 virus that causes COVID-19. Institutional protocols and algorithms that  pertain to the evaluation of patients at risk for COVID-19 are in a state of rapid change based on information released by regulatory bodies including the CDC and federal and state organizations. These policies and algorithms were followed during the patient's care in the ED.  1 week of diarrhea. Reports dark black stools that started today. No anticoagulation. Hemoccult negative. Hemoglobin close to her baseline.  Negative BUN. Not consistent with GI bleed. Rest of labs grossly reassuring without leukocytosis.  No significant electrolyte derangements or renal sufficiency. Low suspicion for serious intra-abdominal inflammatory/infectious process requiring imaging at this time.       Final Clinical Impression(s) / ED Diagnoses Final diagnoses:  Diarrhea, unspecified type   The patient appears reasonably screened and/or stabilized for discharge and I doubt any other medical condition or other [facility] requiring further screening, evaluation, or treatment in the ED at this time prior to discharge. Safe for discharge with strict return precautions.  Disposition: Discharge  Condition: Good  I have discussed the results, Dx and Tx plan with the patient/family who expressed understanding and agree(s) with the plan. Discharge instructions discussed at length. The patient/family was given strict return precautions who verbalized understanding of the instructions. No further questions at time of discharge.    ED Discharge Orders    None       Follow Up: Seward Carol, MD [address] [place] [address] [phone]  Call  to schedule an appointment for close follow up      This chart was dictated using voice recognition software.  Despite best efforts to proofread,  errors can occur which can change the documentation meaning.   Fatima Blank, MD 12/06/20 [phone]

## 2020-12-06 NOTE — ED Notes (Signed)
Patient asking for pain medication. Dr. Leonette Monarch notified.

## 2020-12-11 DIAGNOSIS — E7800 Pure hypercholesterolemia, unspecified: Secondary | ICD-10-CM | POA: Diagnosis not present

## 2020-12-11 DIAGNOSIS — E1169 Type 2 diabetes mellitus with other specified complication: Secondary | ICD-10-CM | POA: Diagnosis not present

## 2020-12-11 DIAGNOSIS — E119 Type 2 diabetes mellitus without complications: Secondary | ICD-10-CM | POA: Diagnosis not present

## 2020-12-11 DIAGNOSIS — J439 Emphysema, unspecified: Secondary | ICD-10-CM | POA: Diagnosis not present

## 2020-12-11 DIAGNOSIS — J441 Chronic obstructive pulmonary disease with (acute) exacerbation: Secondary | ICD-10-CM | POA: Diagnosis not present

## 2020-12-11 DIAGNOSIS — J449 Chronic obstructive pulmonary disease, unspecified: Secondary | ICD-10-CM | POA: Diagnosis not present

## 2020-12-11 DIAGNOSIS — C3491 Malignant neoplasm of unspecified part of right bronchus or lung: Secondary | ICD-10-CM | POA: Diagnosis not present

## 2020-12-11 DIAGNOSIS — E1122 Type 2 diabetes mellitus with diabetic chronic kidney disease: Secondary | ICD-10-CM | POA: Diagnosis not present

## 2020-12-11 DIAGNOSIS — I10 Essential (primary) hypertension: Secondary | ICD-10-CM | POA: Diagnosis not present

## 2020-12-18 ENCOUNTER — Ambulatory Visit: Payer: Medicare Other | Admitting: Gastroenterology

## 2021-01-01 DIAGNOSIS — J439 Emphysema, unspecified: Secondary | ICD-10-CM | POA: Diagnosis not present

## 2021-01-04 ENCOUNTER — Telehealth: Payer: Self-pay | Admitting: Internal Medicine

## 2021-01-04 NOTE — Telephone Encounter (Signed)
Called and spoke with Patient.  Mailing address verified.  Dr. Golden Pop signed handicap placard placed in out going mail.  Nothing further at this time.

## 2021-01-04 NOTE — Telephone Encounter (Signed)
Called and spoke with patient. She stated that she did not receive the handicap placard that was placed in the mail for her on May 12th. I advised her that it was mailed to the address we have a on file. I asked her if she wanted to come by the office to pick up the application since she has been having a hard time with the mail, she still wants to have it mailed to her.    MR, please advise if you are ok with signing another handicap placard for her. Thanks.

## 2021-01-04 NOTE — Telephone Encounter (Signed)
Yes ok to mail another handicap placard to [place] [phone] but if she wants to give you antoehr address she could

## 2021-01-08 DIAGNOSIS — E7800 Pure hypercholesterolemia, unspecified: Secondary | ICD-10-CM | POA: Diagnosis not present

## 2021-01-08 DIAGNOSIS — Z7984 Long term (current) use of oral hypoglycemic drugs: Secondary | ICD-10-CM | POA: Diagnosis not present

## 2021-01-08 DIAGNOSIS — E1122 Type 2 diabetes mellitus with diabetic chronic kidney disease: Secondary | ICD-10-CM | POA: Diagnosis not present

## 2021-01-08 DIAGNOSIS — I10 Essential (primary) hypertension: Secondary | ICD-10-CM | POA: Diagnosis not present

## 2021-01-08 DIAGNOSIS — C3491 Malignant neoplasm of unspecified part of right bronchus or lung: Secondary | ICD-10-CM | POA: Diagnosis not present

## 2021-01-08 DIAGNOSIS — E1169 Type 2 diabetes mellitus with other specified complication: Secondary | ICD-10-CM | POA: Diagnosis not present

## 2021-01-08 DIAGNOSIS — J9611 Chronic respiratory failure with hypoxia: Secondary | ICD-10-CM | POA: Diagnosis not present

## 2021-01-08 DIAGNOSIS — C649 Malignant neoplasm of unspecified kidney, except renal pelvis: Secondary | ICD-10-CM | POA: Diagnosis not present

## 2021-01-08 DIAGNOSIS — E119 Type 2 diabetes mellitus without complications: Secondary | ICD-10-CM | POA: Diagnosis not present

## 2021-01-08 DIAGNOSIS — C50411 Malignant neoplasm of upper-outer quadrant of right female breast: Secondary | ICD-10-CM | POA: Diagnosis not present

## 2021-01-11 DIAGNOSIS — J439 Emphysema, unspecified: Secondary | ICD-10-CM | POA: Diagnosis not present

## 2021-01-12 ENCOUNTER — Telehealth: Payer: Self-pay | Admitting: Hematology and Oncology

## 2021-01-12 NOTE — Telephone Encounter (Signed)
Scheduled appt per 6/20 sch msg. Pt aware.

## 2021-01-13 DIAGNOSIS — E119 Type 2 diabetes mellitus without complications: Secondary | ICD-10-CM | POA: Diagnosis not present

## 2021-01-13 DIAGNOSIS — I10 Essential (primary) hypertension: Secondary | ICD-10-CM | POA: Diagnosis not present

## 2021-01-13 DIAGNOSIS — K219 Gastro-esophageal reflux disease without esophagitis: Secondary | ICD-10-CM | POA: Diagnosis not present

## 2021-01-13 DIAGNOSIS — E7800 Pure hypercholesterolemia, unspecified: Secondary | ICD-10-CM | POA: Diagnosis not present

## 2021-01-13 DIAGNOSIS — C649 Malignant neoplasm of unspecified kidney, except renal pelvis: Secondary | ICD-10-CM | POA: Diagnosis not present

## 2021-01-13 DIAGNOSIS — J439 Emphysema, unspecified: Secondary | ICD-10-CM | POA: Diagnosis not present

## 2021-01-13 DIAGNOSIS — C50411 Malignant neoplasm of upper-outer quadrant of right female breast: Secondary | ICD-10-CM | POA: Diagnosis not present

## 2021-01-13 DIAGNOSIS — J449 Chronic obstructive pulmonary disease, unspecified: Secondary | ICD-10-CM | POA: Diagnosis not present

## 2021-01-13 DIAGNOSIS — C3491 Malignant neoplasm of unspecified part of right bronchus or lung: Secondary | ICD-10-CM | POA: Diagnosis not present

## 2021-01-13 DIAGNOSIS — Z85038 Personal history of other malignant neoplasm of large intestine: Secondary | ICD-10-CM | POA: Diagnosis not present

## 2021-01-13 DIAGNOSIS — E1122 Type 2 diabetes mellitus with diabetic chronic kidney disease: Secondary | ICD-10-CM | POA: Diagnosis not present

## 2021-01-15 ENCOUNTER — Ambulatory Visit: Payer: Medicare Other

## 2021-01-21 ENCOUNTER — Telehealth: Payer: Self-pay | Admitting: Hematology and Oncology

## 2021-01-21 ENCOUNTER — Inpatient Hospital Stay: Payer: Medicare Other | Admitting: Hematology and Oncology

## 2021-01-21 NOTE — Telephone Encounter (Signed)
R/s appt per 6/30 sch msg. Pt aware.

## 2021-01-21 NOTE — Assessment & Plan Note (Deleted)
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 She underwent re-excision on 01/21/13. Pathology revealed no residual carcinoma or atypia. Did not require radiation currently on hormonal therapy with Aromasin 25 mg daily from 04/02/2013  Aromasin toxicities: No major side effects from Aromasin other than occasional hot flashes and joint pains.  Breast cancer surveillance: 1. Breast exam done 01/21/2021 is normal 2. Mammograms scheduled for 02/03/2021  Shortness of breath to minimal exertion:Due toCOPD; Hospitalized May 2018  Continue with current treatment with Aromasin and continue with the surveillance plan.

## 2021-01-28 ENCOUNTER — Ambulatory Visit: Payer: Medicare Other | Admitting: Gastroenterology

## 2021-01-31 DIAGNOSIS — J439 Emphysema, unspecified: Secondary | ICD-10-CM | POA: Diagnosis not present

## 2021-02-03 ENCOUNTER — Ambulatory Visit: Payer: Medicare Other

## 2021-02-04 ENCOUNTER — Ambulatory Visit: Payer: Medicare Other | Admitting: Hematology and Oncology

## 2021-02-04 NOTE — Assessment & Plan Note (Deleted)
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 She underwent re-excision on 01/21/13. Pathology revealed no residual carcinoma or atypia. Did not require radiation currently on hormonal therapy with Aromasin 25 mg daily from 04/02/2013  Aromasin toxicities: No major side effects from Aromasin other than occasional hot flashes and joint pains.  Breast cancer surveillance: 1. Breast exam done  02/04/2021: Benign 2. Mammograms scheduled for 02/10/2021  Shortness of breath to minimal exertion:Due toCOPD; Hospitalized May 2018  Return to clinic in 1 year for follow-up

## 2021-02-10 ENCOUNTER — Ambulatory Visit: Payer: Medicare Other

## 2021-02-10 DIAGNOSIS — J439 Emphysema, unspecified: Secondary | ICD-10-CM | POA: Diagnosis not present

## 2021-02-11 DIAGNOSIS — E1122 Type 2 diabetes mellitus with diabetic chronic kidney disease: Secondary | ICD-10-CM | POA: Diagnosis not present

## 2021-02-11 DIAGNOSIS — J449 Chronic obstructive pulmonary disease, unspecified: Secondary | ICD-10-CM | POA: Diagnosis not present

## 2021-02-11 DIAGNOSIS — C50411 Malignant neoplasm of upper-outer quadrant of right female breast: Secondary | ICD-10-CM | POA: Diagnosis not present

## 2021-02-11 DIAGNOSIS — Z85038 Personal history of other malignant neoplasm of large intestine: Secondary | ICD-10-CM | POA: Diagnosis not present

## 2021-02-11 DIAGNOSIS — I10 Essential (primary) hypertension: Secondary | ICD-10-CM | POA: Diagnosis not present

## 2021-02-11 DIAGNOSIS — K219 Gastro-esophageal reflux disease without esophagitis: Secondary | ICD-10-CM | POA: Diagnosis not present

## 2021-02-11 DIAGNOSIS — C3491 Malignant neoplasm of unspecified part of right bronchus or lung: Secondary | ICD-10-CM | POA: Diagnosis not present

## 2021-02-11 DIAGNOSIS — C649 Malignant neoplasm of unspecified kidney, except renal pelvis: Secondary | ICD-10-CM | POA: Diagnosis not present

## 2021-02-11 DIAGNOSIS — J439 Emphysema, unspecified: Secondary | ICD-10-CM | POA: Diagnosis not present

## 2021-02-11 DIAGNOSIS — E119 Type 2 diabetes mellitus without complications: Secondary | ICD-10-CM | POA: Diagnosis not present

## 2021-02-11 DIAGNOSIS — E7800 Pure hypercholesterolemia, unspecified: Secondary | ICD-10-CM | POA: Diagnosis not present

## 2021-02-16 ENCOUNTER — Other Ambulatory Visit: Payer: Self-pay

## 2021-02-16 NOTE — Patient Outreach (Signed)
Aging Gracefully Program  02/16/2021  Amy [name] 05-17-1946 [id number]  [facility] Evaluation Interviewer attempted to call patient on today regarding Aging Gracefully referral. No answer from patient after multiple rings. CMA left confidential voicemail for patient to return call.  Will attempt to call back later this week.   [place] Management Assistant [phone]

## 2021-02-22 ENCOUNTER — Other Ambulatory Visit: Payer: Self-pay

## 2021-02-22 NOTE — Patient Outreach (Signed)
Aging Gracefully Program  02/22/2021  LYNZIE CLIBURN 1946/03/22 [id number]  [facility] Evaluation Interviewer made contact with patient. Aging Gracefully survey final completed.   Interviewer will send referral to Sharol Given at [facility] follow up.  [place] Management Assistant  [phone]

## 2021-02-25 ENCOUNTER — Inpatient Hospital Stay: Payer: Medicare Other | Admitting: Hematology and Oncology

## 2021-02-25 NOTE — Assessment & Plan Note (Deleted)
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 She underwent re-excision on 01/21/13. Pathology revealed no residual carcinoma or atypia. Did not require radiation currently on hormonal therapy with Aromasin 25 mg daily from 04/02/2013  Aromasin toxicities: No major side effects from Aromasin other than occasional hot flashes and joint pains.  Breast cancer surveillance: 1. Breast exam 02/25/2021: Benign  2. Mammograms  03/28/2018: Benign breast density category C she needs a new mammogram.  Shortness of breath to minimal exertion:Due toCOPD; Hospitalized May 2018  I instructed her that she can now discontinue Aromasin therapy. Return to clinic on an as-needed basis.

## 2021-03-03 ENCOUNTER — Ambulatory Visit: Payer: Medicare Other | Admitting: Gastroenterology

## 2021-03-03 DIAGNOSIS — J439 Emphysema, unspecified: Secondary | ICD-10-CM | POA: Diagnosis not present

## 2021-03-13 DIAGNOSIS — J439 Emphysema, unspecified: Secondary | ICD-10-CM | POA: Diagnosis not present

## 2021-03-18 DIAGNOSIS — K219 Gastro-esophageal reflux disease without esophagitis: Secondary | ICD-10-CM | POA: Diagnosis not present

## 2021-03-18 DIAGNOSIS — I10 Essential (primary) hypertension: Secondary | ICD-10-CM | POA: Diagnosis not present

## 2021-03-18 DIAGNOSIS — J449 Chronic obstructive pulmonary disease, unspecified: Secondary | ICD-10-CM | POA: Diagnosis not present

## 2021-03-18 DIAGNOSIS — C649 Malignant neoplasm of unspecified kidney, except renal pelvis: Secondary | ICD-10-CM | POA: Diagnosis not present

## 2021-03-18 DIAGNOSIS — C3491 Malignant neoplasm of unspecified part of right bronchus or lung: Secondary | ICD-10-CM | POA: Diagnosis not present

## 2021-03-18 DIAGNOSIS — J439 Emphysema, unspecified: Secondary | ICD-10-CM | POA: Diagnosis not present

## 2021-03-18 DIAGNOSIS — E119 Type 2 diabetes mellitus without complications: Secondary | ICD-10-CM | POA: Diagnosis not present

## 2021-03-18 DIAGNOSIS — E1122 Type 2 diabetes mellitus with diabetic chronic kidney disease: Secondary | ICD-10-CM | POA: Diagnosis not present

## 2021-03-18 DIAGNOSIS — E7800 Pure hypercholesterolemia, unspecified: Secondary | ICD-10-CM | POA: Diagnosis not present

## 2021-03-18 DIAGNOSIS — Z85038 Personal history of other malignant neoplasm of large intestine: Secondary | ICD-10-CM | POA: Diagnosis not present

## 2021-03-18 DIAGNOSIS — C50411 Malignant neoplasm of upper-outer quadrant of right female breast: Secondary | ICD-10-CM | POA: Diagnosis not present

## 2021-04-03 DIAGNOSIS — J439 Emphysema, unspecified: Secondary | ICD-10-CM | POA: Diagnosis not present

## 2021-04-13 DIAGNOSIS — J439 Emphysema, unspecified: Secondary | ICD-10-CM | POA: Diagnosis not present

## 2021-04-20 ENCOUNTER — Ambulatory Visit: Payer: Medicare Other | Admitting: Gastroenterology

## 2021-05-03 DIAGNOSIS — J439 Emphysema, unspecified: Secondary | ICD-10-CM | POA: Diagnosis not present

## 2021-05-12 DIAGNOSIS — E7800 Pure hypercholesterolemia, unspecified: Secondary | ICD-10-CM | POA: Diagnosis not present

## 2021-05-12 DIAGNOSIS — J439 Emphysema, unspecified: Secondary | ICD-10-CM | POA: Diagnosis not present

## 2021-05-12 DIAGNOSIS — E1169 Type 2 diabetes mellitus with other specified complication: Secondary | ICD-10-CM | POA: Diagnosis not present

## 2021-05-12 DIAGNOSIS — K219 Gastro-esophageal reflux disease without esophagitis: Secondary | ICD-10-CM | POA: Diagnosis not present

## 2021-05-12 DIAGNOSIS — I10 Essential (primary) hypertension: Secondary | ICD-10-CM | POA: Diagnosis not present

## 2021-05-12 DIAGNOSIS — J449 Chronic obstructive pulmonary disease, unspecified: Secondary | ICD-10-CM | POA: Diagnosis not present

## 2021-05-12 DIAGNOSIS — J441 Chronic obstructive pulmonary disease with (acute) exacerbation: Secondary | ICD-10-CM | POA: Diagnosis not present

## 2021-05-12 DIAGNOSIS — E1122 Type 2 diabetes mellitus with diabetic chronic kidney disease: Secondary | ICD-10-CM | POA: Diagnosis not present

## 2021-05-12 DIAGNOSIS — E119 Type 2 diabetes mellitus without complications: Secondary | ICD-10-CM | POA: Diagnosis not present

## 2021-05-13 DIAGNOSIS — J439 Emphysema, unspecified: Secondary | ICD-10-CM | POA: Diagnosis not present

## 2021-05-19 ENCOUNTER — Ambulatory Visit: Payer: Medicare Other | Admitting: Gastroenterology

## 2021-06-03 DIAGNOSIS — J439 Emphysema, unspecified: Secondary | ICD-10-CM | POA: Diagnosis not present

## 2021-06-13 DIAGNOSIS — J439 Emphysema, unspecified: Secondary | ICD-10-CM | POA: Diagnosis not present

## 2021-06-15 DIAGNOSIS — E119 Type 2 diabetes mellitus without complications: Secondary | ICD-10-CM | POA: Diagnosis not present

## 2021-06-15 DIAGNOSIS — I10 Essential (primary) hypertension: Secondary | ICD-10-CM | POA: Diagnosis not present

## 2021-06-15 DIAGNOSIS — E1122 Type 2 diabetes mellitus with diabetic chronic kidney disease: Secondary | ICD-10-CM | POA: Diagnosis not present

## 2021-06-15 DIAGNOSIS — J441 Chronic obstructive pulmonary disease with (acute) exacerbation: Secondary | ICD-10-CM | POA: Diagnosis not present

## 2021-06-15 DIAGNOSIS — E1169 Type 2 diabetes mellitus with other specified complication: Secondary | ICD-10-CM | POA: Diagnosis not present

## 2021-06-15 DIAGNOSIS — E7800 Pure hypercholesterolemia, unspecified: Secondary | ICD-10-CM | POA: Diagnosis not present

## 2021-06-15 DIAGNOSIS — J439 Emphysema, unspecified: Secondary | ICD-10-CM | POA: Diagnosis not present

## 2021-06-15 DIAGNOSIS — K219 Gastro-esophageal reflux disease without esophagitis: Secondary | ICD-10-CM | POA: Diagnosis not present

## 2021-06-15 DIAGNOSIS — J449 Chronic obstructive pulmonary disease, unspecified: Secondary | ICD-10-CM | POA: Diagnosis not present

## 2021-07-03 DIAGNOSIS — J439 Emphysema, unspecified: Secondary | ICD-10-CM | POA: Diagnosis not present

## 2021-07-13 DIAGNOSIS — J439 Emphysema, unspecified: Secondary | ICD-10-CM | POA: Diagnosis not present

## 2021-08-03 DIAGNOSIS — J439 Emphysema, unspecified: Secondary | ICD-10-CM | POA: Diagnosis not present

## 2021-08-13 DIAGNOSIS — J439 Emphysema, unspecified: Secondary | ICD-10-CM | POA: Diagnosis not present

## 2021-08-20 DIAGNOSIS — I10 Essential (primary) hypertension: Secondary | ICD-10-CM | POA: Diagnosis not present

## 2021-08-20 DIAGNOSIS — E1122 Type 2 diabetes mellitus with diabetic chronic kidney disease: Secondary | ICD-10-CM | POA: Diagnosis not present

## 2021-08-20 DIAGNOSIS — E1169 Type 2 diabetes mellitus with other specified complication: Secondary | ICD-10-CM | POA: Diagnosis not present

## 2021-08-20 DIAGNOSIS — C3491 Malignant neoplasm of unspecified part of right bronchus or lung: Secondary | ICD-10-CM | POA: Diagnosis not present

## 2021-08-20 DIAGNOSIS — C50411 Malignant neoplasm of upper-outer quadrant of right female breast: Secondary | ICD-10-CM | POA: Diagnosis not present

## 2021-08-20 DIAGNOSIS — E7800 Pure hypercholesterolemia, unspecified: Secondary | ICD-10-CM | POA: Diagnosis not present

## 2021-08-20 DIAGNOSIS — J441 Chronic obstructive pulmonary disease with (acute) exacerbation: Secondary | ICD-10-CM | POA: Diagnosis not present

## 2021-08-20 DIAGNOSIS — K219 Gastro-esophageal reflux disease without esophagitis: Secondary | ICD-10-CM | POA: Diagnosis not present

## 2021-08-20 DIAGNOSIS — C649 Malignant neoplasm of unspecified kidney, except renal pelvis: Secondary | ICD-10-CM | POA: Diagnosis not present

## 2021-09-03 DIAGNOSIS — J439 Emphysema, unspecified: Secondary | ICD-10-CM | POA: Diagnosis not present

## 2021-09-12 DIAGNOSIS — E1169 Type 2 diabetes mellitus with other specified complication: Secondary | ICD-10-CM | POA: Diagnosis not present

## 2021-09-12 DIAGNOSIS — E7800 Pure hypercholesterolemia, unspecified: Secondary | ICD-10-CM | POA: Diagnosis not present

## 2021-09-12 DIAGNOSIS — I10 Essential (primary) hypertension: Secondary | ICD-10-CM | POA: Diagnosis not present

## 2021-09-12 DIAGNOSIS — J441 Chronic obstructive pulmonary disease with (acute) exacerbation: Secondary | ICD-10-CM | POA: Diagnosis not present

## 2021-09-13 DIAGNOSIS — J439 Emphysema, unspecified: Secondary | ICD-10-CM | POA: Diagnosis not present

## 2021-10-01 DIAGNOSIS — J439 Emphysema, unspecified: Secondary | ICD-10-CM | POA: Diagnosis not present

## 2021-10-11 DIAGNOSIS — J439 Emphysema, unspecified: Secondary | ICD-10-CM | POA: Diagnosis not present

## 2021-10-19 DIAGNOSIS — E1122 Type 2 diabetes mellitus with diabetic chronic kidney disease: Secondary | ICD-10-CM | POA: Diagnosis not present

## 2021-10-19 DIAGNOSIS — I10 Essential (primary) hypertension: Secondary | ICD-10-CM | POA: Diagnosis not present

## 2021-10-19 DIAGNOSIS — E7800 Pure hypercholesterolemia, unspecified: Secondary | ICD-10-CM | POA: Diagnosis not present

## 2021-11-01 DIAGNOSIS — J439 Emphysema, unspecified: Secondary | ICD-10-CM | POA: Diagnosis not present

## 2021-11-03 ENCOUNTER — Inpatient Hospital Stay (HOSPITAL_COMMUNITY)
Admission: EM | Admit: 2021-11-03 | Discharge: 2021-11-07 | DRG: 190 | Disposition: A | Discharge status: Home Health | Payer: Medicare Other | Attending: Internal Medicine | Admitting: Internal Medicine

## 2021-11-03 ENCOUNTER — Encounter (HOSPITAL_COMMUNITY): Payer: Self-pay | Admitting: Oncology

## 2021-11-03 ENCOUNTER — Emergency Department (HOSPITAL_COMMUNITY): Payer: Medicare Other

## 2021-11-03 ENCOUNTER — Other Ambulatory Visit: Payer: Self-pay

## 2021-11-03 ENCOUNTER — Emergency Department (HOSPITAL_BASED_OUTPATIENT_CLINIC_OR_DEPARTMENT_OTHER): Admit: 2021-11-03 | Discharge: 2021-11-03 | Disposition: A | Discharge status: Home / Self Care | Payer: Medicare Other

## 2021-11-03 DIAGNOSIS — E119 Type 2 diabetes mellitus without complications: Secondary | ICD-10-CM

## 2021-11-03 DIAGNOSIS — K5900 Constipation, unspecified: Secondary | ICD-10-CM | POA: Diagnosis present

## 2021-11-03 DIAGNOSIS — I517 Cardiomegaly: Secondary | ICD-10-CM | POA: Diagnosis not present

## 2021-11-03 DIAGNOSIS — Z87891 Personal history of nicotine dependence: Secondary | ICD-10-CM

## 2021-11-03 DIAGNOSIS — G4489 Other headache syndrome: Secondary | ICD-10-CM | POA: Diagnosis not present

## 2021-11-03 DIAGNOSIS — I10 Essential (primary) hypertension: Secondary | ICD-10-CM | POA: Diagnosis present

## 2021-11-03 DIAGNOSIS — E041 Nontoxic single thyroid nodule: Secondary | ICD-10-CM | POA: Diagnosis not present

## 2021-11-03 DIAGNOSIS — R6889 Other general symptoms and signs: Secondary | ICD-10-CM

## 2021-11-03 DIAGNOSIS — Z806 Family history of leukemia: Secondary | ICD-10-CM

## 2021-11-03 DIAGNOSIS — J9611 Chronic respiratory failure with hypoxia: Secondary | ICD-10-CM | POA: Diagnosis not present

## 2021-11-03 DIAGNOSIS — F419 Anxiety disorder, unspecified: Secondary | ICD-10-CM

## 2021-11-03 DIAGNOSIS — E785 Hyperlipidemia, unspecified: Secondary | ICD-10-CM | POA: Diagnosis not present

## 2021-11-03 DIAGNOSIS — Z85038 Personal history of other malignant neoplasm of large intestine: Secondary | ICD-10-CM | POA: Diagnosis not present

## 2021-11-03 DIAGNOSIS — Z888 Allergy status to other drugs, medicaments and biological substances status: Secondary | ICD-10-CM | POA: Diagnosis not present

## 2021-11-03 DIAGNOSIS — I160 Hypertensive urgency: Secondary | ICD-10-CM

## 2021-11-03 DIAGNOSIS — Z91013 Allergy to seafood: Secondary | ICD-10-CM | POA: Diagnosis not present

## 2021-11-03 DIAGNOSIS — Z800 Family history of malignant neoplasm of digestive organs: Secondary | ICD-10-CM | POA: Diagnosis not present

## 2021-11-03 DIAGNOSIS — Z803 Family history of malignant neoplasm of breast: Secondary | ICD-10-CM

## 2021-11-03 DIAGNOSIS — Z9049 Acquired absence of other specified parts of digestive tract: Secondary | ICD-10-CM | POA: Diagnosis not present

## 2021-11-03 DIAGNOSIS — M7989 Other specified soft tissue disorders: Secondary | ICD-10-CM | POA: Diagnosis not present

## 2021-11-03 DIAGNOSIS — Z91041 Radiographic dye allergy status: Secondary | ICD-10-CM | POA: Diagnosis not present

## 2021-11-03 DIAGNOSIS — E876 Hypokalemia: Secondary | ICD-10-CM

## 2021-11-03 DIAGNOSIS — R0602 Shortness of breath: Secondary | ICD-10-CM | POA: Diagnosis not present

## 2021-11-03 DIAGNOSIS — J441 Chronic obstructive pulmonary disease with (acute) exacerbation: Secondary | ICD-10-CM | POA: Diagnosis not present

## 2021-11-03 DIAGNOSIS — Z85118 Personal history of other malignant neoplasm of bronchus and lung: Secondary | ICD-10-CM | POA: Diagnosis not present

## 2021-11-03 DIAGNOSIS — Z91040 Latex allergy status: Secondary | ICD-10-CM

## 2021-11-03 DIAGNOSIS — Z20822 Contact with and (suspected) exposure to covid-19: Secondary | ICD-10-CM | POA: Diagnosis not present

## 2021-11-03 DIAGNOSIS — R911 Solitary pulmonary nodule: Secondary | ICD-10-CM | POA: Diagnosis not present

## 2021-11-03 DIAGNOSIS — J45901 Unspecified asthma with (acute) exacerbation: Secondary | ICD-10-CM

## 2021-11-03 DIAGNOSIS — K219 Gastro-esophageal reflux disease without esophagitis: Secondary | ICD-10-CM | POA: Diagnosis not present

## 2021-11-03 DIAGNOSIS — Z853 Personal history of malignant neoplasm of breast: Secondary | ICD-10-CM | POA: Diagnosis not present

## 2021-11-03 DIAGNOSIS — R918 Other nonspecific abnormal finding of lung field: Secondary | ICD-10-CM | POA: Diagnosis not present

## 2021-11-03 DIAGNOSIS — Z7984 Long term (current) use of oral hypoglycemic drugs: Secondary | ICD-10-CM

## 2021-11-03 DIAGNOSIS — E1142 Type 2 diabetes mellitus with diabetic polyneuropathy: Secondary | ICD-10-CM | POA: Diagnosis not present

## 2021-11-03 DIAGNOSIS — E1169 Type 2 diabetes mellitus with other specified complication: Secondary | ICD-10-CM | POA: Diagnosis not present

## 2021-11-03 DIAGNOSIS — R0902 Hypoxemia: Secondary | ICD-10-CM | POA: Diagnosis not present

## 2021-11-03 DIAGNOSIS — J9621 Acute and chronic respiratory failure with hypoxia: Secondary | ICD-10-CM | POA: Diagnosis not present

## 2021-11-03 DIAGNOSIS — D573 Sickle-cell trait: Secondary | ICD-10-CM | POA: Diagnosis present

## 2021-11-03 DIAGNOSIS — Z807 Family history of other malignant neoplasms of lymphoid, hematopoietic and related tissues: Secondary | ICD-10-CM

## 2021-11-03 DIAGNOSIS — Z8249 Family history of ischemic heart disease and other diseases of the circulatory system: Secondary | ICD-10-CM

## 2021-11-03 DIAGNOSIS — Z79899 Other long term (current) drug therapy: Secondary | ICD-10-CM

## 2021-11-03 DIAGNOSIS — Z7951 Long term (current) use of inhaled steroids: Secondary | ICD-10-CM

## 2021-11-03 DIAGNOSIS — Z743 Need for continuous supervision: Secondary | ICD-10-CM | POA: Diagnosis not present

## 2021-11-03 DIAGNOSIS — R069 Unspecified abnormalities of breathing: Secondary | ICD-10-CM | POA: Diagnosis not present

## 2021-11-03 HISTORY — DX: Unspecified asthma with (acute) exacerbation: J45.901

## 2021-11-03 LAB — RESP PANEL BY RT-PCR (FLU A&B, COVID) ARPGX2
Influenza A by PCR: NEGATIVE
Influenza B by PCR: NEGATIVE
SARS Coronavirus 2 by RT PCR: NEGATIVE

## 2021-11-03 LAB — BLOOD GAS, ARTERIAL
Acid-Base Excess: 15.5 mmol/L — ABNORMAL HIGH (ref 0.0–2.0)
Bicarbonate: 43.7 mmol/L — ABNORMAL HIGH (ref 20.0–28.0)
O2 Saturation: 98.1 %
Patient temperature: 37
pCO2 arterial: 69 mmHg (ref 32–48)
pH, Arterial: 7.41 (ref 7.35–7.45)
pO2, Arterial: 90 mmHg (ref 83–108)

## 2021-11-03 LAB — COMPREHENSIVE METABOLIC PANEL
ALT: 11 U/L (ref 0–44)
AST: 14 U/L — ABNORMAL LOW (ref 15–41)
Albumin: 4.2 g/dL (ref 3.5–5.0)
Alkaline Phosphatase: 55 U/L (ref 38–126)
Anion gap: 6 (ref 5–15)
BUN: 21 mg/dL (ref 8–23)
CO2: 39 mmol/L — ABNORMAL HIGH (ref 22–32)
Calcium: 9.4 mg/dL (ref 8.9–10.3)
Chloride: 95 mmol/L — ABNORMAL LOW (ref 98–111)
Creatinine, Ser: 0.72 mg/dL (ref 0.44–1.00)
GFR, Estimated: >60 mL/min (ref >60)
Glucose, Bld: 155 mg/dL — ABNORMAL HIGH (ref 70–99)
Potassium: 2.9 mmol/L — ABNORMAL LOW (ref 3.5–5.1)
Sodium: 140 mmol/L (ref 135–145)
Total Bilirubin: 0.7 mg/dL (ref 0.3–1.2)
Total Protein: 7 g/dL (ref 6.5–8.1)

## 2021-11-03 LAB — CBC
HCT: 35.3 % — ABNORMAL LOW (ref 36.0–46.0)
Hemoglobin: 11.4 g/dL — ABNORMAL LOW (ref 12.0–15.0)
MCH: 29 pg (ref 26.0–34.0)
MCHC: 32.3 g/dL (ref 30.0–36.0)
MCV: 89.8 fL (ref 80.0–100.0)
Platelets: 137 10*3/uL — ABNORMAL LOW (ref 150–400)
RBC: 3.93 MIL/uL (ref 3.87–5.11)
RDW: 14.7 % (ref 11.5–15.5)
WBC: 7.2 10*3/uL (ref 4.0–10.5)
nRBC: 0 % (ref 0.0–0.2)

## 2021-11-03 LAB — CBC WITH DIFFERENTIAL/PLATELET
Abs Immature Granulocytes: 0.02 10*3/uL (ref 0.00–0.07)
Basophils Absolute: 0 10*3/uL (ref 0.0–0.1)
Basophils Relative: 0 %
Eosinophils Absolute: 0.1 10*3/uL (ref 0.0–0.5)
Eosinophils Relative: 1 %
HCT: 36.8 % (ref 36.0–46.0)
Hemoglobin: 12 g/dL (ref 12.0–15.0)
Immature Granulocytes: 0 %
Lymphocytes Relative: 21 %
Lymphs Abs: 1.3 10*3/uL (ref 0.7–4.0)
MCH: 29 pg (ref 26.0–34.0)
MCHC: 32.6 g/dL (ref 30.0–36.0)
MCV: 88.9 fL (ref 80.0–100.0)
Monocytes Absolute: 0.3 10*3/uL (ref 0.1–1.0)
Monocytes Relative: 5 %
Neutro Abs: 4.6 10*3/uL (ref 1.7–7.7)
Neutrophils Relative %: 73 %
Platelets: 142 10*3/uL — ABNORMAL LOW (ref 150–400)
RBC: 4.14 MIL/uL (ref 3.87–5.11)
RDW: 14.6 % (ref 11.5–15.5)
WBC: 6.3 10*3/uL (ref 4.0–10.5)
nRBC: 0 % (ref 0.0–0.2)

## 2021-11-03 LAB — HEMOGLOBIN A1C
Hgb A1c MFr Bld: 5.9 % — ABNORMAL HIGH (ref 4.8–5.6)
Mean Plasma Glucose: 122.63 mg/dL

## 2021-11-03 LAB — TROPONIN I (HIGH SENSITIVITY)
Troponin I (High Sensitivity): 6 ng/L (ref <18)
Troponin I (High Sensitivity): 5 ng/L (ref <18)

## 2021-11-03 LAB — BRAIN NATRIURETIC PEPTIDE: B Natriuretic Peptide: 112.2 pg/mL — ABNORMAL HIGH (ref 0.0–100.0)

## 2021-11-03 LAB — CREATININE, SERUM
Creatinine, Ser: 0.77 mg/dL (ref 0.44–1.00)
GFR, Estimated: >60 mL/min (ref >60)

## 2021-11-03 LAB — MAGNESIUM: Magnesium: 2 mg/dL (ref 1.7–2.4)

## 2021-11-03 LAB — D-DIMER, QUANTITATIVE: D-Dimer, Quant: <0.27 ug/mL-FEU (ref 0.00–0.50)

## 2021-11-03 LAB — CBG MONITORING, ED: Glucose-Capillary: 253 mg/dL — ABNORMAL HIGH (ref 70–99)

## 2021-11-03 MED ORDER — NITROGLYCERIN 2 % TD OINT
1.0000 [in_us] | TOPICAL_OINTMENT | Freq: Once | TRANSDERMAL | Status: AC
  Administered 2021-11-03: 1 [in_us] via TOPICAL
  Filled 2021-11-03: qty 1

## 2021-11-03 MED ORDER — POTASSIUM CHLORIDE 10 MEQ/100ML IV SOLN
10.0000 meq | INTRAVENOUS | Status: AC
  Administered 2021-11-03 (×2): 10 meq via INTRAVENOUS
  Filled 2021-11-03 (×2): qty 100

## 2021-11-03 MED ORDER — PANTOPRAZOLE SODIUM 40 MG PO TBEC
40.0000 mg | DELAYED_RELEASE_TABLET | Freq: Every day | ORAL | Status: DC
  Administered 2021-11-03 – 2021-11-07 (×5): 40 mg via ORAL
  Filled 2021-11-03 (×5): qty 1

## 2021-11-03 MED ORDER — DM-GUAIFENESIN ER 30-600 MG PO TB12
1.0000 | ORAL_TABLET | Freq: Two times a day (BID) | ORAL | Status: AC
  Administered 2021-11-04 (×2): 1 via ORAL
  Filled 2021-11-03 (×2): qty 1

## 2021-11-03 MED ORDER — ALBUTEROL SULFATE (2.5 MG/3ML) 0.083% IN NEBU
2.5000 mg | INHALATION_SOLUTION | Freq: Three times a day (TID) | RESPIRATORY_TRACT | Status: DC
  Administered 2021-11-04 – 2021-11-07 (×11): 2.5 mg via RESPIRATORY_TRACT
  Filled 2021-11-03 (×12): qty 3

## 2021-11-03 MED ORDER — TIOTROPIUM BROMIDE MONOHYDRATE 1.25 MCG/ACT IN AERS: 2.0000 | INHALATION_SPRAY | Freq: Every day | RESPIRATORY_TRACT | Status: DC

## 2021-11-03 MED ORDER — METOPROLOL TARTRATE 50 MG PO TABS
100.0000 mg | ORAL_TABLET | Freq: Every day | ORAL | Status: DC
Start: 2021-11-04 — End: 2021-11-07
  Administered 2021-11-03 – 2021-11-07 (×5): 100 mg via ORAL
  Filled 2021-11-03 (×2): qty 2
  Filled 2021-11-03: qty 4
  Filled 2021-11-03 (×2): qty 2

## 2021-11-03 MED ORDER — ACETAMINOPHEN 325 MG PO TABS
650.0000 mg | ORAL_TABLET | Freq: Once | ORAL | Status: AC
  Administered 2021-11-03: 650 mg via ORAL
  Filled 2021-11-03: qty 2

## 2021-11-03 MED ORDER — IRON 240 (27 FE) MG PO TABS: 1.0000 | ORAL_TABLET | Freq: Every morning | ORAL | Status: DC

## 2021-11-03 MED ORDER — ACETAMINOPHEN 325 MG PO TABS
650.0000 mg | ORAL_TABLET | Freq: Four times a day (QID) | ORAL | Status: DC | PRN
  Administered 2021-11-04: 650 mg via ORAL
  Filled 2021-11-03: qty 2

## 2021-11-03 MED ORDER — ATORVASTATIN CALCIUM 20 MG PO TABS
20.0000 mg | ORAL_TABLET | Freq: Every day | ORAL | Status: DC
Start: 2021-11-04 — End: 2021-11-07
  Administered 2021-11-04 – 2021-11-07 (×4): 20 mg via ORAL
  Filled 2021-11-03 (×4): qty 1

## 2021-11-03 MED ORDER — ENOXAPARIN SODIUM 40 MG/0.4ML IJ SOSY: 40.0000 mg | PREFILLED_SYRINGE | INTRAMUSCULAR | Status: DC

## 2021-11-03 MED ORDER — TRAMADOL HCL 50 MG PO TABS
50.0000 mg | ORAL_TABLET | Freq: Two times a day (BID) | ORAL | Status: DC | PRN
Start: 2021-11-03 — End: 2021-11-07
  Administered 2021-11-04 – 2021-11-05 (×3): 50 mg via ORAL
  Filled 2021-11-03 (×3): qty 1

## 2021-11-03 MED ORDER — ALPRAZOLAM 0.5 MG PO TABS
0.5000 mg | ORAL_TABLET | Freq: Every day | ORAL | Status: DC | PRN
  Administered 2021-11-04 – 2021-11-07 (×4): 0.5 mg via ORAL
  Filled 2021-11-03 (×4): qty 1

## 2021-11-03 MED ORDER — ENOXAPARIN SODIUM 40 MG/0.4ML IJ SOSY
40.0000 mg | PREFILLED_SYRINGE | INTRAMUSCULAR | Status: DC
  Administered 2021-11-03 – 2021-11-06 (×4): 40 mg via SUBCUTANEOUS
  Filled 2021-11-03 (×4): qty 0.4

## 2021-11-03 MED ORDER — ALBUTEROL SULFATE HFA 108 (90 BASE) MCG/ACT IN AERS
4.0000 | INHALATION_SPRAY | Freq: Once | RESPIRATORY_TRACT | Status: AC
  Administered 2021-11-03: 4 via RESPIRATORY_TRACT
  Filled 2021-11-03: qty 6.7

## 2021-11-03 MED ORDER — PAROXETINE HCL 20 MG PO TABS
40.0000 mg | ORAL_TABLET | Freq: Every day | ORAL | Status: DC
  Administered 2021-11-04 – 2021-11-07 (×4): 40 mg via ORAL
  Filled 2021-11-03 (×4): qty 2

## 2021-11-03 MED ORDER — HYDRALAZINE HCL 20 MG/ML IJ SOLN
10.0000 mg | Freq: Three times a day (TID) | INTRAMUSCULAR | Status: DC | PRN
  Administered 2021-11-03 – 2021-11-07 (×3): 10 mg via INTRAVENOUS
  Filled 2021-11-03 (×4): qty 1

## 2021-11-03 MED ORDER — METHYLPREDNISOLONE SODIUM SUCC 125 MG IJ SOLR
125.0000 mg | Freq: Once | INTRAMUSCULAR | Status: AC
  Administered 2021-11-03: 125 mg via INTRAVENOUS
  Filled 2021-11-03: qty 2

## 2021-11-03 MED ORDER — UMECLIDINIUM BROMIDE 62.5 MCG/ACT IN AEPB
1.0000 | INHALATION_SPRAY | Freq: Every day | RESPIRATORY_TRACT | Status: DC
  Administered 2021-11-05 – 2021-11-07 (×3): 1 via RESPIRATORY_TRACT
  Filled 2021-11-03: qty 7

## 2021-11-03 MED ORDER — ALBUTEROL SULFATE (2.5 MG/3ML) 0.083% IN NEBU
2.5000 mg | INHALATION_SOLUTION | Freq: Four times a day (QID) | RESPIRATORY_TRACT | Status: DC
  Administered 2021-11-03: 2.5 mg via RESPIRATORY_TRACT
  Filled 2021-11-03: qty 3

## 2021-11-03 MED ORDER — INSULIN ASPART 100 UNIT/ML IJ SOLN
0.0000 [IU] | Freq: Three times a day (TID) | INTRAMUSCULAR | Status: DC
  Administered 2021-11-04 (×2): 3 [IU] via SUBCUTANEOUS
  Administered 2021-11-04: 8 [IU] via SUBCUTANEOUS
  Administered 2021-11-05: 3 [IU] via SUBCUTANEOUS
  Administered 2021-11-05 – 2021-11-06 (×3): 2 [IU] via SUBCUTANEOUS
  Administered 2021-11-06: 5 [IU] via SUBCUTANEOUS
  Administered 2021-11-06 – 2021-11-07 (×2): 2 [IU] via SUBCUTANEOUS
  Administered 2021-11-07: 3 [IU] via SUBCUTANEOUS
  Filled 2021-11-03: qty 0.15

## 2021-11-03 MED ORDER — LOSARTAN POTASSIUM 50 MG PO TABS
50.0000 mg | ORAL_TABLET | Freq: Every day | ORAL | Status: DC
  Administered 2021-11-03 – 2021-11-07 (×5): 50 mg via ORAL
  Filled 2021-11-03 (×2): qty 1
  Filled 2021-11-03: qty 2
  Filled 2021-11-03 (×2): qty 1

## 2021-11-03 MED ORDER — POLYSACCHARIDE IRON COMPLEX 150 MG PO CAPS
150.0000 mg | ORAL_CAPSULE | Freq: Every day | ORAL | Status: DC
  Administered 2021-11-04 – 2021-11-07 (×4): 150 mg via ORAL
  Filled 2021-11-03 (×4): qty 1

## 2021-11-03 MED ORDER — HYDROCHLOROTHIAZIDE 25 MG PO TABS
25.0000 mg | ORAL_TABLET | Freq: Every day | ORAL | Status: DC
  Administered 2021-11-04 – 2021-11-07 (×4): 25 mg via ORAL
  Filled 2021-11-03 (×4): qty 1

## 2021-11-03 MED ORDER — MOMETASONE FURO-FORMOTEROL FUM 200-5 MCG/ACT IN AERO
2.0000 | INHALATION_SPRAY | Freq: Two times a day (BID) | RESPIRATORY_TRACT | Status: DC
  Administered 2021-11-04 – 2021-11-07 (×6): 2 via RESPIRATORY_TRACT
  Filled 2021-11-03: qty 8.8

## 2021-11-03 MED ORDER — POTASSIUM CHLORIDE 20 MEQ PO PACK
40.0000 meq | PACK | Freq: Once | ORAL | Status: AC
  Administered 2021-11-03: 40 meq via ORAL
  Filled 2021-11-03: qty 2

## 2021-11-03 MED ORDER — LEVALBUTEROL HCL 0.63 MG/3ML IN NEBU
0.6300 mg | INHALATION_SOLUTION | Freq: Four times a day (QID) | RESPIRATORY_TRACT | Status: DC | PRN
  Administered 2021-11-03 – 2021-11-04 (×2): 0.63 mg via RESPIRATORY_TRACT
  Filled 2021-11-03 (×2): qty 3

## 2021-11-03 MED ORDER — ACETAMINOPHEN 650 MG RE SUPP
650.0000 mg | Freq: Four times a day (QID) | RECTAL | Status: DC | PRN
Start: 2021-11-03 — End: 2021-11-07

## 2021-11-03 MED ORDER — INSULIN ASPART 100 UNIT/ML IJ SOLN
0.0000 [IU] | Freq: Every day | INTRAMUSCULAR | Status: DC
  Administered 2021-11-03: 3 [IU] via SUBCUTANEOUS
  Filled 2021-11-03: qty 0.05

## 2021-11-03 NOTE — ED Notes (Signed)
[place] sandwich, 2 packs of graham crackers and diet ginger ale provided. Pt used bedside commode independently. ?

## 2021-11-03 NOTE — ED Notes (Signed)
Pt's BP noted to be significantly lower after hydralazine. Pt denies headache now. Denies dizziness/lightheadedness when asked. ?

## 2021-11-03 NOTE — ED Provider Notes (Signed)
?[facility] DEPT ?Provider Note ? ? ?CSN: [id number] ?Arrival date & time: 11/03/21  1008 ? ?  ? ?History ? ?Chief Complaint  ?Patient presents with  ? Shortness of Breath  ? ? ?Amy [name] is a 76 y.o. female. ? ? ?Shortness of Breath ?Patient presents for shortness of breath.  Medical history includes NSCLC, RCC, breast cancer, colon cancer, COPD, DM, chronic hypoxia.  She is not on anticoagulation.  She has been told that her multiple cancers are in remission.  She is not on chemotherapy.  At baseline, she utilizes 2 L of supplemental oxygen.  Yesterday she reports that she was in her normal state of health.  Last night, she was getting ready to go to the bathroom.  She experienced acute onset of shortness of breath.  This shortness of breath persisted throughout the night.  She increased her home oxygen from 2 L to 3 L.  She did take her home Spiriva and puffs from her rescue inhaler.  She does feel that this improves her symptoms.  Due to her persistent symptoms this morning, EMS was called.  EMS was able to wean her back to her baseline 2 L of oxygen.  They did not provide any further interventions prior to arrival.  Currently, patient reports that her breathing feels normal to her, while at rest.  She denies any areas of discomfort at this time.  She has not had a recent worsened cough.  She lives alone.  She ambulates with a cane. ?  ? ?Home Medications ?Prior to Admission medications   ?Medication Sig Start Date End Date Taking? Authorizing Provider  ?acetaminophen (TYLENOL) 500 MG tablet Take 1,000 mg by mouth every 6 (six) hours as needed (pain).    [provider]  ?Albuterol Sulfate (PROAIR RESPICLICK) 175 (90 Base) MCG/ACT AEPB Inhale 2 puffs into the lungs every 8 (eight) hours as needed (wheezing/shortness of breath). 08/01/19   Brand Males, MD  ?ALPRAZolam Duanne Moron) 1 MG tablet Take 0.5 mg by mouth at bedtime.    [provider]  ?Ascorbic Acid  (VITAMIN C WITH ROSE HIPS) 500 MG tablet Take 500 mg by mouth daily.    [provider]  ?atorvastatin (LIPITOR) 20 MG tablet Take 20 mg by mouth daily.  03/08/12   [provider]  ?budesonide-formoterol (SYMBICORT) 160-4.5 MCG/ACT inhaler Inhale 2 puffs into the lungs 2 (two) times daily. 05/20/19   Brand Males, MD  ?Calcium Carbonate-Vitamin D (CALCIUM-VITAMIN D) 500-200 MG-UNIT per tablet Take 1 tablet by mouth every Monday, Wednesday, and Friday.     [provider]  ?Ferrous Gluconate (IRON) 240 (27 FE) MG TABS Take 1 tablet by mouth every morning.    [provider]  ?hydrochlorothiazide 25 MG tablet Take 25 mg by mouth daily before breakfast.  01/03/11   [provider]  ?losartan (COZAAR) 50 MG tablet Take 50 mg by mouth daily.    [provider]  ?metFORMIN (GLUCOPHAGE) 500 MG tablet Take 500 mg by mouth daily with breakfast.  12/26/12   [provider]  ?metoprolol (LOPRESSOR) 100 MG tablet Take 100 mg by mouth daily before breakfast.  01/03/11   [provider]  ?omeprazole (PRILOSEC) 20 MG capsule Take 20 mg by mouth daily before breakfast.    [provider]  ?OXYGEN Inhale into the lungs. Use 2L continuously    [provider]  ?PARoxetine (PAXIL) 40 MG tablet Take 40 mg by mouth daily before breakfast.  12/22/10   [provider]  ?Tiotropium Bromide Monohydrate (SPIRIVA RESPIMAT) 1.25 MCG/ACT AERS Inhale 2 puffs into the lungs daily. 09/24/20 10/24/20  Brand Males, MD  ?traMADol (ULTRAM) 50 MG tablet Take 50 mg by mouth every 12 (twelve) hours as needed (pain). 12/27/10   [provider]  ?   ? ?Allergies    ?Crab [shellfish allergy], Iodine, Albuterol-ipratropium [ipratropium-albuterol], Incruse ellipta [umeclidinium bromide], Iohexol, Lactose intolerance (gi), Adhesive [tape], and Latex   ? ?Review of Systems   ?Review of Systems  ?Respiratory:  Positive for shortness of breath.   ?All other  systems reviewed and are negative. ? ?Physical Exam ?Updated Vital Signs ?BP (!) 190/148   Pulse 76   Temp 99.1 ?F (37.3 ?C) (Oral)   Resp (!) 26   LMP 07/26/1983   SpO2 100%  ?Physical Exam ?Vitals and nursing note reviewed.  ?Constitutional:   ?   General: She is not in acute distress. ?   Appearance: She is well-developed. She is not ill-appearing, toxic-appearing or diaphoretic.  ?HENT:  ?   Head: Normocephalic and atraumatic.  ?   Mouth/Throat:  ?   Mouth: Mucous membranes are moist.  ?   Pharynx: Oropharynx is clear.  ?Eyes:  ?   Extraocular Movements: Extraocular movements intact.  ?   Conjunctiva/sclera: Conjunctivae normal.  ?Cardiovascular:  ?   Rate and Rhythm: Normal rate and regular rhythm.  ?   Heart sounds: No murmur heard. ?Pulmonary:  ?   Effort: Pulmonary effort is normal. No tachypnea, accessory muscle usage or respiratory distress.  ?   Breath sounds: Normal breath sounds. No decreased breath sounds, wheezing, rhonchi or rales.  ?Chest:  ?   Chest wall: No tenderness.  ?Abdominal:  ?   Palpations: Abdomen is soft.  ?   Tenderness: There is no abdominal tenderness.  ?Musculoskeletal:     ?   General: No swelling.  ?   Cervical back: Normal range of motion and neck supple.  ?   Right lower leg: No edema.  ?   Left lower leg: No edema.  ?Skin: ?   General: Skin is warm and dry.  ?   Capillary Refill: Capillary refill takes less than 2 seconds.  ?Neurological:  ?   General: No focal deficit present.  ?   Mental Status: She is alert and oriented to person, place, and time.  ?   Cranial Nerves: No cranial nerve deficit.  ?   Motor: No weakness.  ?Psychiatric:     ?   Mood and Affect: Mood normal.     ?   Behavior: Behavior normal.  ? ? ?ED Results / Procedures / Treatments   ?Labs ?(all labs ordered are listed, but only abnormal results are displayed) ?Labs Reviewed  ?COMPREHENSIVE METABOLIC PANEL - Abnormal; Notable for the following components:  ?    Result Value  ? Potassium 2.9 (*)   ?  Chloride 95 (*)   ? CO2 39 (*)   ? Glucose, Bld 155 (*)   ? AST 14 (*)   ? All other components within normal limits  ?CBC WITH DIFFERENTIAL/PLATELET - Abnormal; Notable for the following components:  ? Platelets 142 (*)   ? All other components within normal limits  ?BRAIN NATRIURETIC PEPTIDE - Abnormal; Notable for the following components:  ? B Natriuretic Peptide 112.2 (*)   ? All other components within normal limits  ?RESP PANEL BY RT-PCR (FLU A&B, COVID) ARPGX2  ?MAGNESIUM  ?D-DIMER, QUANTITATIVE  ?  TROPONIN I (HIGH SENSITIVITY)  ?TROPONIN I (HIGH SENSITIVITY)  ? ? ?EKG ?EKG Interpretation ? ?Date/Time:  Wednesday November 03 2021 10:19:58 EDT ?Ventricular Rate:  83 ?PR Interval:  176 ?QRS Duration: 85 ?QT Interval:  400 ?QTC Calculation: 470 ?R Axis:   23 ?Text Interpretation: Sinus rhythm Confirmed by Godfrey Pick [phone]) on 11/03/2021 11:16:15 AM ? ?Radiology ?CT Chest Wo Contrast ? ?Result Date: 11/03/2021 ?CLINICAL DATA:  Respiratory illness. EXAM: CT CHEST WITHOUT CONTRAST TECHNIQUE: Multidetector CT imaging of the chest was performed following the standard protocol without IV contrast. RADIATION DOSE REDUCTION: This exam was performed according to the departmental dose-optimization program which includes automated exposure control, adjustment of the mA and/or kV according to patient size and/or use of iterative reconstruction technique. COMPARISON:  December 27, 2016. FINDINGS: Cardiovascular: Atherosclerosis of thoracic aorta is noted without aneurysm formation. Normal cardiac size. No pericardial effusion. Enlarged pulmonary artery is noted suggesting pulmonary artery hypertension. Mediastinum/Nodes: No enlarged mediastinal or axillary lymph nodes. 1.6 cm left thyroid nodule is noted. Trachea and esophagus demonstrate no significant findings. Lungs/Pleura: No pneumothorax or pleural effusion is noted. Mild bibasilar subsegmental atelectasis or scarring is noted. 5 mm nodule is seen in posterior portion of left  upper lobe adjacent to major fissure best seen on image number 31 of series 5. No acute consolidative process is noted. Upper Abdomen: Status post right nephrectomy. Musculoskeletal: No chest wall mass or suspicious bo

## 2021-11-03 NOTE — Progress Notes (Signed)
Left lower extremity venous duplex has been completed. ?Preliminary results can be found in CV Proc through chart review.  ?Results were given to Dr. Doren Custard. ? ?11/03/21 11:18 AM ?Amy [name] RVT   ?

## 2021-11-03 NOTE — H&P (Signed)
?History and Physical  ? ? ?Patient: Amy [name]:[id number] DOB: 04-Feb-1946 ?DOA: 11/03/2021 ?DOS: the patient was seen and examined on 11/03/2021 ?PCP: Seward Carol, MD  ?Patient coming from: Home ? ?Chief Complaint:  ?Chief Complaint  ?Patient presents with  ? Shortness of Breath  ? ?HPI: Amy [name] is a 76 y.o. female with medical history significant of COPD, chronic hypoxic respiratory failure on 2L [place], HTN, DM2, HLD, anxiety, GERD. Presenting with dyspnea. She was in her normal state of health until last night. She acutely felt short of breath. She tried her inhalers and it did help her symptoms some. She increased her O2 to 3L. When she woke this morning, she still felt short of breath overall, so she called EMS. She denies fevers, sick contacts, swelling. She denies any other aggravating or alleviating factors.  ?   ?Review of Systems: As mentioned in the history of present illness. All other systems reviewed and are negative. ?Past Medical History:  ?Diagnosis Date  ? Allergy   ? Anemia   ? Anxiety   ? Arthritis   ? Blood dyscrasia   ? Sickle Cell traits  ? Blood transfusion without reported diagnosis 1999  ? prbc S/P COLON SURGERY  ? Breast cancer, right breast ([place]) 10/03/12  ? Cancer [facility])   ? colon,renal,lung  ? Cataract   ? NO SURGERY  ? Chronic kidney disease 1999  ? RIGHT KIDNEY REMOVED  ? Colon cancer [facility]) 1999, 2013  ? Depression   ? Diabetes mellitus   ? diet controlled  ? GERD (gastroesophageal reflux disease)   ? H/O: lung cancer   ? Heart murmur   ? Hernia   ? Near umbilicus  ? History of colon cancer   ? History of kidney cancer   ? Hot flashes   ? 5 yr pill for breast CA causing hot flashes  ? HX: breast cancer 2014  ? --right breast  ? Hypertension   ? Does not see a cardiologist  ? Internal hemorrhoids   ? Interstitial lung disease ([place]) 11/25/2015  ? Lung cancer ([place]) 2001  ? Neuromuscular disorder ([place])   ? PERIPHERAL NEUROPATHY FEET  ? Shortness of breath   ? with exertion   ?  Sickle cell trait ([place])   ? Tubular adenoma of colon   ? Ulcer   ? HX YEARS AGO  ? ?Past Surgical History:  ?Procedure Laterality Date  ? ABDOMINAL HYSTERECTOMY  80's  ? fibroids  ? BIOPSY  07/02/2018  ? Procedure: BIOPSY;  Surgeon: Ladene Artist, MD;  Location: [facility];  Service: Endoscopy;;  ? BREAST BIOPSY    ? Bilateral cysts/benign  ? BREAST BIOPSY Right 10/03/12  ? ER/PR +  ? BREAST BIOPSY Right 12/20/2012  ? Procedure: Right breast Excisional Biopsy;  Surgeon: Rolm Bookbinder, MD;  Location: [facility];  Service: General;  Laterality: Right;  ? BREAST LUMPECTOMY    ? BREAST LUMPECTOMY WITH NEEDLE LOCALIZATION Right 12/20/2012  ? Procedure: BREAST LUMPECTOMY WITH NEEDLE LOCALIZATION;  Surgeon: Rolm Bookbinder, MD;  Location: [facility];  Service: General;  Laterality: Right;  ? BREAST LUMPECTOMY WITH RADIOACTIVE SEED LOCALIZATION Right 01/19/2015  ? Procedure: RIGHT BREAST LUMPECTOMY WITH RADIOACTIVE SEED LOCALIZATION;  Surgeon: Rolm Bookbinder, MD;  Location: [facility];  Service: General;  Laterality: Right;  ? CARDIAC CATHETERIZATION N/A 03/02/2016  ? Procedure: Right Heart Cath;  Surgeon: Jolaine Artist, MD;  Location: [facility];  Service: Cardiovascular;  Laterality:  N/A;  ? CATARACT EXTRACTION, BILATERAL  01/2018 &02/2018  ? COLECTOMY  05-31-2012  ? laparoscopic ,possible open  ? COLON SURGERY    ? chemo  ? COLONOSCOPY    ? COLONOSCOPY WITH PROPOFOL N/A 07/02/2018  ? Procedure: COLONOSCOPY WITH PROPOFOL;  Surgeon: Ladene Artist, MD;  Location: [facility];  Service: Endoscopy;  Laterality: N/A;  ? KIDNEY SURGERY  1999  ? right/chemo kidney removed for cancer  ? LUNG LOBECTOMY    ? right lower  ? PARTIAL COLECTOMY  05/31/2012  ? Procedure: PARTIAL COLECTOMY;  Surgeon: Rolm Bookbinder, MD;  Location: [facility];  Service: General;  Laterality: N/A;  ? PARTIAL HYSTERECTOMY    ? POLYPECTOMY    ? POLYPECTOMY  07/02/2018  ? Procedure: POLYPECTOMY;  Surgeon: Ladene Artist, MD;   Location: [facility];  Service: Endoscopy;;  ? RE-EXCISION OF BREAST CANCER,SUPERIOR MARGINS Right 01/21/2013  ? Procedure: RE-EXCISION OF BREAST CANCER,SUPERIOR MARGINS;  Surgeon: Rolm Bookbinder, MD;  Location: [facility];  Service: General;  Laterality: Right;  ? TONSILLECTOMY    ? as child  ? TUBAL LIGATION    ? ?Social History:  reports that she quit smoking about 7 years ago. Her smoking use included cigarettes. She started smoking about 29 years ago. She has a 26.25 pack-year smoking history. She has never used smokeless tobacco. She reports that she does not drink alcohol and does not use drugs. ? ?Allergies  ?Allergen Reactions  ? Crab [Shellfish Allergy] Anaphylaxis  ? Iodine Anaphylaxis  ? Albuterol-Ipratropium [Ipratropium-Albuterol]   ?  Tremors, blurred vision.   ? Incruse Ellipta [Umeclidinium Bromide] Other (See Comments)  ?  Trembling, blurred vision  ? Iohexol   ?   Code: HIVES, Desc: pt states , swelling mouth , throat, and has difficulty breathing ?  ? Lactose Intolerance (Gi)   ? Adhesive [Tape] Rash  ? Latex Itching and Rash  ? ? ?Family History  ?Problem Relation Age of Onset  ? Colon polyps Brother   ? Multiple myeloma Brother 32  ? Breast cancer Mother   ?     dx in her 78s  ? Heart disease Father   ? Colon cancer Paternal Aunt   ? Breast cancer Maternal Grandmother   ? Colon cancer Cousin   ?     2 paternal first cousins  ? Leukemia Cousin 11  ? Esophageal cancer Neg Hx   ? Rectal cancer Neg Hx   ? Stomach cancer Neg Hx   ? ? ?Prior to Admission medications   ?Medication Sig Start Date End Date Taking? Authorizing Provider  ?acetaminophen (TYLENOL) 500 MG tablet Take 1,000 mg by mouth every 6 (six) hours as needed (pain).    [provider]  ?Albuterol Sulfate (PROAIR RESPICLICK) 462 (90 Base) MCG/ACT AEPB Inhale 2 puffs into the lungs every 8 (eight) hours as needed (wheezing/shortness of breath). 08/01/19   Brand Males, MD  ?ALPRAZolam Duanne Moron) 1 MG tablet Take 0.5 mg by mouth  at bedtime.    [provider]  ?Ascorbic Acid (VITAMIN C WITH ROSE HIPS) 500 MG tablet Take 500 mg by mouth daily.    [provider]  ?atorvastatin (LIPITOR) 20 MG tablet Take 20 mg by mouth daily.  03/08/12   [provider]  ?budesonide-formoterol (SYMBICORT) 160-4.5 MCG/ACT inhaler Inhale 2 puffs into the lungs 2 (two) times daily. 05/20/19   Brand Males, MD  ?Calcium Carbonate-Vitamin D (CALCIUM-VITAMIN D) 500-200 MG-UNIT per tablet Take 1 tablet by mouth every  Monday, Wednesday, and Friday.     [provider]  ?Ferrous Gluconate (IRON) 240 (27 FE) MG TABS Take 1 tablet by mouth every morning.    [provider]  ?hydrochlorothiazide 25 MG tablet Take 25 mg by mouth daily before breakfast.  01/03/11   [provider]  ?losartan (COZAAR) 50 MG tablet Take 50 mg by mouth daily.    [provider]  ?metFORMIN (GLUCOPHAGE) 500 MG tablet Take 500 mg by mouth daily with breakfast.  12/26/12   [provider]  ?metoprolol (LOPRESSOR) 100 MG tablet Take 100 mg by mouth daily before breakfast.  01/03/11   [provider]  ?omeprazole (PRILOSEC) 20 MG capsule Take 20 mg by mouth daily before breakfast.    [provider]  ?OXYGEN Inhale into the lungs. Use 2L continuously    [provider]  ?PARoxetine (PAXIL) 40 MG tablet Take 40 mg by mouth daily before breakfast.  12/22/10   [provider]  ?Tiotropium Bromide Monohydrate (SPIRIVA RESPIMAT) 1.25 MCG/ACT AERS Inhale 2 puffs into the lungs daily. 09/24/20 10/24/20  Brand Males, MD  ?traMADol (ULTRAM) 50 MG tablet Take 50 mg by mouth every 12 (twelve) hours as needed (pain). 12/27/10   [provider]  ? ? ?Physical Exam: ?Vitals:  ? 11/03/21 1445 11/03/21 1500 11/03/21 1515 11/03/21 1530  ?BP:  (!) 194/148  (!) 189/131  ?Pulse: 73 78 79 75  ?Resp: (!) 27 (!) 23 18 (!) 27  ?Temp:      ?TempSrc:      ?SpO2: 99% 100% 99% 99%  ? ?General: 76 y.o. female  resting in bed in NAD ?Eyes: PERRL, normal sclera ?ENMT: Nares patent w/o discharge, orophaynx clear, dentition poor, ears w/o discharge/lesions/ulcers ?Neck: Supple, trachea midline ?Cardiovascular: RRR, +S1, S2

## 2021-11-03 NOTE — ED Notes (Signed)
Went into room to introduce self and nitro paste card on the table. Pt states that the paste and card were on L arm - Card was placed on with paper tape, so it likely fell off. ?

## 2021-11-03 NOTE — ED Notes (Signed)
Patient called out around 2205 c/o SOB, like she has mucous in her throat that she isn't able to expectorate. Pt was tachypneic and anxious at the time. Pt offered a breathing treatment and states she would like to try that. After xopenex treatment, pt states that she still feels like she has mucous in her throat, but that her breathing is a little better. Pt remains slightly tachypneic at rest. ?

## 2021-11-03 NOTE — ED Triage Notes (Signed)
Pt bib [facility] from home d/t shob that began at 2000 on 11/02/21.  Pt is on home O2 at 2L had to increase it to 3L.  Pt's breathing is labored at this time w/ minimal exertion.  ?

## 2021-11-04 DIAGNOSIS — E876 Hypokalemia: Secondary | ICD-10-CM

## 2021-11-04 DIAGNOSIS — J9621 Acute and chronic respiratory failure with hypoxia: Secondary | ICD-10-CM

## 2021-11-04 DIAGNOSIS — I160 Hypertensive urgency: Secondary | ICD-10-CM

## 2021-11-04 DIAGNOSIS — E041 Nontoxic single thyroid nodule: Secondary | ICD-10-CM

## 2021-11-04 DIAGNOSIS — E1169 Type 2 diabetes mellitus with other specified complication: Secondary | ICD-10-CM

## 2021-11-04 LAB — GLUCOSE, CAPILLARY
Glucose-Capillary: 152 mg/dL — ABNORMAL HIGH (ref 70–99)
Glucose-Capillary: 254 mg/dL — ABNORMAL HIGH (ref 70–99)
Glucose-Capillary: 174 mg/dL — ABNORMAL HIGH (ref 70–99)
Glucose-Capillary: 179 mg/dL — ABNORMAL HIGH (ref 70–99)

## 2021-11-04 LAB — CBC
HCT: 38.9 % (ref 36.0–46.0)
Hemoglobin: 12.3 g/dL (ref 12.0–15.0)
MCH: 28.1 pg (ref 26.0–34.0)
MCHC: 31.6 g/dL (ref 30.0–36.0)
MCV: 89 fL (ref 80.0–100.0)
Platelets: 155 10*3/uL (ref 150–400)
RBC: 4.37 MIL/uL (ref 3.87–5.11)
RDW: 14.6 % (ref 11.5–15.5)
WBC: 7.7 10*3/uL (ref 4.0–10.5)
nRBC: 0 % (ref 0.0–0.2)

## 2021-11-04 LAB — BLOOD GAS, ARTERIAL
Acid-Base Excess: 13 mmol/L — ABNORMAL HIGH (ref 0.0–2.0)
Bicarbonate: 38.2 mmol/L — ABNORMAL HIGH (ref 20.0–28.0)
Drawn by: 25788
FIO2: 32 %
O2 Saturation: 94.9 %
Patient temperature: 36.7
pCO2 arterial: 48 mmHg (ref 32–48)
pH, Arterial: 7.51 — ABNORMAL HIGH (ref 7.35–7.45)
pO2, Arterial: 85 mmHg (ref 83–108)

## 2021-11-04 LAB — COMPREHENSIVE METABOLIC PANEL
ALT: 15 U/L (ref 0–44)
AST: 19 U/L (ref 15–41)
Albumin: 4.1 g/dL (ref 3.5–5.0)
Alkaline Phosphatase: 60 U/L (ref 38–126)
Anion gap: 11 (ref 5–15)
BUN: 18 mg/dL (ref 8–23)
CO2: 34 mmol/L — ABNORMAL HIGH (ref 22–32)
Calcium: 9.6 mg/dL (ref 8.9–10.3)
Chloride: 97 mmol/L — ABNORMAL LOW (ref 98–111)
Creatinine, Ser: 0.43 mg/dL — ABNORMAL LOW (ref 0.44–1.00)
GFR, Estimated: >60 mL/min (ref >60)
Glucose, Bld: 145 mg/dL — ABNORMAL HIGH (ref 70–99)
Potassium: 3.8 mmol/L (ref 3.5–5.1)
Sodium: 142 mmol/L (ref 135–145)
Total Bilirubin: 0.6 mg/dL (ref 0.3–1.2)
Total Protein: 7.5 g/dL (ref 6.5–8.1)

## 2021-11-04 MED ORDER — ORAL CARE MOUTH RINSE
15.0000 mL | Freq: Two times a day (BID) | OROMUCOSAL | Status: DC
  Administered 2021-11-04 – 2021-11-07 (×7): 15 mL via OROMUCOSAL

## 2021-11-04 MED ORDER — ADULT MULTIVITAMIN W/MINERALS CH
1.0000 | ORAL_TABLET | Freq: Every day | ORAL | Status: DC
  Administered 2021-11-04 – 2021-11-07 (×4): 1 via ORAL
  Filled 2021-11-04 (×4): qty 1

## 2021-11-04 MED ORDER — ENSURE MAX PROTEIN PO LIQD
11.0000 [oz_av] | Freq: Every day | ORAL | Status: DC
  Administered 2021-11-04 – 2021-11-07 (×4): 11 [oz_av] via ORAL
  Filled 2021-11-04 (×4): qty 330

## 2021-11-04 NOTE — Plan of Care (Signed)
  Problem: Activity: Goal: Ability to tolerate increased activity will improve Outcome: Progressing   

## 2021-11-04 NOTE — Progress Notes (Signed)
Patient c/o headache and rates pain 9/10. Will follow up with blood pressure recheck and prn pain medication ?

## 2021-11-04 NOTE — Progress Notes (Signed)
?PROGRESS NOTE ? ?Amy [name] WYO:[id number] DOB: [birth year] DOA: 11/03/2021 ?PCP: Seward Carol, MD ? ?Brief History   ?Amy [name] is a 76 y.o. female with medical history significant of COPD, chronic hypoxic respiratory failure on 2L [place], HTN, DM2, HLD, anxiety, GERD. Presenting with dyspnea. She was in her normal state of health until last night. She acutely felt short of breath. She tried her inhalers and it did help her symptoms some. She increased her O2 to 3L. When she woke this morning, she still felt short of breath overall, so she called EMS. She denies fevers, sick contacts, swelling. She denies any other aggravating or alleviating factors.  ?   ?[facility] were consulted to admit the patient for further evaluation and care.  ? ?She was given nebulizer treatments and steroids. Viral respiratory panel was negative for respiratory pathogens. ?Her oxygen requirements at admission were 3L. It has been reduced to 2L. ? ?Consultants  ?None ? ?Procedures  ?None ? ?Antibiotics  ? ?Anti-infectives (From admission, onward)  ? ? None  ? ?  ? ? ?Subjective  ?The patient is resting comfortably. No new complaints. ? ?Objective  ? ?Vitals:  ?Vitals:  ? 11/04/21 0829 11/04/21 1100  ?BP: (!) 189/99 139/75  ?Pulse: 84 77  ?Resp: 20 18  ?Temp: 97.9 ?F (36.6 ?C) 97.7 ?F (36.5 ?C)  ?SpO2: 99% 100%  ? ? ?Exam: ? ?Constitutional:  ?The patient is awake, alert, and oriented x 3. No acute distress. ?Respiratory:  ?No increased work of breathing. ?Diminished breath sounds throughout ?No wheezes, rales, or rhonchi ?No tactile fremitus ?Cardiovascular:  ?Regular rate and rhythm ?No murmurs, ectopy, or gallups. ?No lateral PMI. No thrills. ?Abdomen:  ?Abdomen is soft, non-tender, non-distended ?No hernias, masses, or organomegaly ?Normoactive bowel sounds.  ?Musculoskeletal:  ?No cyanosis, clubbing, or edema ?Skin:  ?No rashes, lesions, ulcers ?palpation of skin: no induration or nodules ?Neurologic:  ?CN 2-12  intact ?Sensation all 4 extremities intact ?Psychiatric:  ?Mental status ?Mood, affect appropriate ?Orientation to person, place, time  ?judgment and insight appear intact ? ? ?I have personally reviewed the following:  ? ?Today's Data  ?Vitals ? ?Lab Data  ?ABG ? ?Micro Data  ?Respiratory viral panel. ? ?Imaging  ?CT chest: No acute pathology, nodules that will need to be followed. ?CXR ? ?Cardiology Data  ?EKG ? ?Other Data  ? ? ?Scheduled Meds: ? albuterol  2.5 mg Nebulization TID  ? atorvastatin  20 mg Oral Daily  ? enoxaparin (LOVENOX) injection  40 mg Subcutaneous Q24H  ? hydrochlorothiazide  25 mg Oral QAC breakfast  ? insulin aspart  0-15 Units Subcutaneous TID WC  ? insulin aspart  0-5 Units Subcutaneous QHS  ? iron polysaccharides  150 mg Oral Daily  ? losartan  50 mg Oral Daily  ? mouth rinse  15 mL Mouth Rinse BID  ? metoprolol tartrate  100 mg Oral QAC breakfast  ? mometasone-formoterol  2 puff Inhalation BID  ? multivitamin with minerals  1 tablet Oral Daily  ? pantoprazole  40 mg Oral Daily  ? PARoxetine  40 mg Oral QAC breakfast  ? Ensure Max Protein  11 oz Oral Daily  ? umeclidinium bromide  1 puff Inhalation Daily  ? ?Active Problems: ?  COPD with acute exacerbation ([place]) ?  DM (diabetes mellitus), type 2 ([place]) ?  Chronic respiratory failure with hypoxia ([facility]) ?  Hypokalemia ?  HLD (hyperlipidemia) ?  GERD (gastroesophageal reflux disease) ?  Hypertensive urgency ?  Thyroid nodule ?  Anxiety ? ? ?A & P  ? ?COPD exacerbation ?    - admit to inpt, progressive ?    - check ABG ?    - continue O2 support ?    - nebs ?    - resume home inhalers when confirmed ?    - CT chest as above ?     ?Hypertensive urgency ?    - resume home meds ?    - started on 1in NTG paste in ED ?    - will have PRN available ?  ?Hypokalemia ?    - repalce K+, Mg2+ is ok ?  ?DM2 ?    - A1c, SSI, DM diet ?  ?HLD ?    - resume home statin when confirmed ?  ?GERD ?    - PPI ?  ?Thyroid nodule ?    - as seen on CT ?    - can get  thyroid US outpt or after we get BP under control ?  ?Anxiety ?    - resume home regimen when confirmed ? ?I have seen and examined this patient myself. I have spent 34 minutes in her evaluation and care. ? ?DVT prophylaxis: Lovenox ?Code Status: Full Code ?Family Communication: Family is at bedside ?Disposition Plan: home  ? ? ?Shaquilla Kehres, DO ?[facility] ?Direct contact: see [email or web address]  ?7PM-7AM contact night coverage as above ?11/04/2021, 7:03 PM  LOS: 1 day  ? LOS: 1 day  ? ? ? ? ? ? ?  ?

## 2021-11-04 NOTE — Progress Notes (Signed)
Initial Nutrition Assessment ? ?DOCUMENTATION CODES:  ? ?Obesity unspecified ? ?INTERVENTION:  ?- Encourage PO intake ?- Ensure Max po BID, each supplement provides 150 kcal and 30 grams of protein.  ?- MVI with minerals daily ? ?NUTRITION DIAGNOSIS:  ? ?Increased nutrient needs related to acute illness as evidenced by estimated needs. ? ?GOAL:  ? ?Patient will meet greater than or equal to 90% of their needs ? ?MONITOR:  ? ?PO intake, Supplement acceptance, Labs, Weight trends ? ?REASON FOR ASSESSMENT:  ? ?Malnutrition Screening Tool ?  ? ?ASSESSMENT:  ? ?Pt admitted with SOB. PMH significant for COPD, chronic hypoxic respiratory failure on 2L [place], HTN, T2DM, HLD, anxiety, and GERD. ? ?Pt is pleasant sitting in chair during visit. Offered therapeutic listening as pt expressed a decrease in appetite and PO intake within the last year. She attributes this to depression d/t the loss of multiple family members and history of multiple cancers. Pt reports having support from family close by. She is strong in her faith but has not recently been to church. She is appreciative of recommendation to see a chaplain during admission. Reached out to MD for consult.  ? ?She enjoys the food provided during admission and has eaten 100% of her breakfast this morning (fruit, eggs, grits, pork patty, and cranberry juice). Within the last year she reports eating 1 meal per day which may consist of baked chicken, broccoli, and spaghetti with ketchup. She may have some snacks throughout the day which include peanut butter crackers, applesauce or a chicken sandwich. She checks her blood sugar daily and reports they are usually 118-150 and she does not take insulin at home.  ? ?Pt reports a weight of ~250 lbs last year and reports a weight loss of about 40 lbs. Reviewed weight history. Limited documentation of recent weights. Noted a 4% weight loss within the last year.  ? ?Medications: SSI, niferex, protonix ? ?Labs: Cr 0.43, HgbA1c 5.9%,  CBG's 152, 253 ? ?NUTRITION - FOCUSED PHYSICAL EXAM: ? ?Flowsheet Row Most Recent Value  ?Orbital Region Mild depletion  ?Upper Arm Region No depletion  ?Thoracic and Lumbar Region No depletion  ?Buccal Region No depletion  ?Temple Region No depletion  ?Clavicle Bone Region No depletion  ?Clavicle and Acromion Bone Region No depletion  ?Scapular Bone Region No depletion  ?Dorsal Hand Mild depletion  ?Patellar Region No depletion  ?Anterior Thigh Region No depletion  ?Posterior Calf Region No depletion  ?Edema (RD Assessment) None  ?Hair Reviewed  ?Eyes Reviewed  ?Mouth Reviewed  ?Skin Reviewed  ?Nails Reviewed  ? ?  ? ? ?Diet Order:   ?Diet Order   ? ?       ?  Diet Carb Modified Fluid consistency: Thin; Room service appropriate? Yes  Diet effective now       ?  ? ?  ?  ? ?  ? ?EDUCATION NEEDS:  ? ?Education needs have been addressed ? ?Skin:  Skin Assessment: Reviewed RN Assessment ? ?Last BM:  PTA ? ?Height:  ? ?Ht Readings from Last 1 Encounters:  ?11/03/21 5\' 6"  (1.676 m)  ? ? ?Weight:  ? ?Wt Readings from Last 1 Encounters:  ?11/03/21 109.2 kg  ? ?Ideal Body Weight:  59.1 kg ? ?BMI:  Body mass index is 38.85 kg/m?. ? ?Estimated Nutritional Needs:  ? ?Kcal:  1700-1900 ? ?Protein:  85-100g ? ?Fluid:  >/=1.7L ? ?Amy [name], RDN, LDN ?Clinical Nutrition ?

## 2021-11-05 DIAGNOSIS — K5900 Constipation, unspecified: Secondary | ICD-10-CM

## 2021-11-05 DIAGNOSIS — F419 Anxiety disorder, unspecified: Secondary | ICD-10-CM

## 2021-11-05 DIAGNOSIS — K219 Gastro-esophageal reflux disease without esophagitis: Secondary | ICD-10-CM

## 2021-11-05 LAB — RESPIRATORY PANEL BY PCR

## 2021-11-05 LAB — GLUCOSE, CAPILLARY
Glucose-Capillary: 143 mg/dL — ABNORMAL HIGH (ref 70–99)
Glucose-Capillary: 185 mg/dL — ABNORMAL HIGH (ref 70–99)
Glucose-Capillary: 142 mg/dL — ABNORMAL HIGH (ref 70–99)
Glucose-Capillary: 147 mg/dL — ABNORMAL HIGH (ref 70–99)

## 2021-11-05 MED ORDER — POLYETHYLENE GLYCOL 3350 17 G PO PACK
17.0000 g | PACK | Freq: Once | ORAL | Status: AC
  Administered 2021-11-05: 17 g via ORAL
  Filled 2021-11-05: qty 1

## 2021-11-05 MED ORDER — BISACODYL 10 MG RE SUPP
10.0000 mg | Freq: Once | RECTAL | Status: AC
  Administered 2021-11-05: 10 mg via RECTAL
  Filled 2021-11-05: qty 1

## 2021-11-05 MED ORDER — DOCUSATE SODIUM 100 MG PO CAPS
200.0000 mg | ORAL_CAPSULE | Freq: Once | ORAL | Status: AC
  Administered 2021-11-05: 200 mg via ORAL
  Filled 2021-11-05: qty 2

## 2021-11-05 NOTE — Progress Notes (Signed)
Patient oxygen saturation is 90% on room air. ?

## 2021-11-05 NOTE — Progress Notes (Signed)
Patient walked without oxygen and saturation dropped to 83%. She had to stop and sit down to catch her breath. She recovered to 95% on 2 liters/min nasal cannula. . She still felt like she was out of breath and we did not walk more than 35 feet. She seems to be fairly deconditioned. Oxygen saturation stayed between 90%-95% with 2 liters O2 for remainder of ambulation. ? ?

## 2021-11-05 NOTE — Progress Notes (Signed)
Chaplain provided listening and emotional support as Amy [name] shared about her health struggles, including several types of cancer over the years as well as depression.  She also shared about the significant losses she has had in her life, including her grandmother, both parents and her adult son at the age of 81. She and her brother are the only family that remains and her brother has his own health concerns and is currently receiving dialysis.  Chaplain provided grief support as well as prayer. ? ?[employer], Bcc ?Pager, [phone] ?

## 2021-11-05 NOTE — Progress Notes (Signed)
?PROGRESS NOTE ? ?Amy [name] HQP:[id number] DOB: January 26, 1946 DOA: 11/03/2021 ?PCP: Seward Carol, MD ? ?Brief History   ?Amy [name] is a 76 y.o. female with medical history significant of COPD, chronic hypoxic respiratory failure on 2L , HTN, DM2, HLD, anxiety, GERD. Presenting with dyspnea. She was in her normal state of health until last night. She acutely felt short of breath. She tried her inhalers and it did help her symptoms some. She increased her O2 to 3L. When she woke this morning, she still felt short of breath overall, so she called EMS. She denies fevers, sick contacts, swelling. She denies any other aggravating or alleviating factors.  ?   ?[facility] were consulted to admit the patient for further evaluation and care.  ? ?She was given nebulizer treatments and steroids. Viral respiratory panel was negative for respiratory pathogens. ?Her oxygen requirements at admission were 3L. It has been reduced to 2L. ? ?Today the patient is saturating 100% on 2L. Off of O2 at rest her oxygen saturations has been 93%. She does get short of breath with ambulation.  ? ?The patient states that she has not had a BM since the day before presentation or Tuesday. Stool softeners and suppository given. ? ?Consultants  ?None ? ?Procedures  ?None ? ?Antibiotics  ? ?Anti-infectives (From admission, onward)  ? ? None  ? ?  ? ? ?Subjective  ?The patient is resting comfortably. No new complaints. ? ?Objective  ? ?Vitals:  ?Vitals:  ? 11/05/21 0906 11/05/21 1158  ?BP:  135/70  ?Pulse:  75  ?Resp:  20  ?Temp:  98 ?F (36.7 ?C)  ?SpO2: 99% 92%  ? ? ?Exam: ? ?Constitutional:  ?The patient is awake, alert, and oriented x 3. No acute distress. ?Respiratory:  ?No increased work of breathing. ?No wheezes, rales, or rhonchi ?No tactile fremitus ?Cardiovascular:  ?Regular rate and rhythm ?No murmurs, ectopy, or gallups. ?No lateral PMI. No thrills. ?Abdomen:  ?Abdomen is soft, non-tender, non-distended ?No hernias, masses,  or organomegaly ?Normoactive bowel sounds.  ?Musculoskeletal:  ?No cyanosis, clubbing, or edema ?Skin:  ?No rashes, lesions, ulcers ?palpation of skin: no induration or nodules ?Neurologic:  ?CN 2-12 intact ?Sensation all 4 extremities intact ?Psychiatric:  ?Mental status ?Mood, affect appropriate ?Orientation to person, place, time  ?judgment and insight appear intact ? ? ?I have personally reviewed the following:  ? ?Today's Data  ?Vitals ? ?Lab Data  ?ABG ? ?Micro Data  ?Respiratory viral panel. ? ?Imaging  ?CT chest: No acute pathology, nodules that will need to be followed. ?CXR ? ?Cardiology Data  ?EKG ? ?Other Data  ? ? ?Scheduled Meds: ? albuterol  2.5 mg Nebulization TID  ? atorvastatin  20 mg Oral Daily  ? bisacodyl  10 mg Rectal Once  ? enoxaparin (LOVENOX) injection  40 mg Subcutaneous Q24H  ? hydrochlorothiazide  25 mg Oral QAC breakfast  ? insulin aspart  0-15 Units Subcutaneous TID WC  ? insulin aspart  0-5 Units Subcutaneous QHS  ? iron polysaccharides  150 mg Oral Daily  ? losartan  50 mg Oral Daily  ? mouth rinse  15 mL Mouth Rinse BID  ? metoprolol tartrate  100 mg Oral QAC breakfast  ? mometasone-formoterol  2 puff Inhalation BID  ? multivitamin with minerals  1 tablet Oral Daily  ? pantoprazole  40 mg Oral Daily  ? PARoxetine  40 mg Oral QAC breakfast  ? Ensure Max Protein  11 oz Oral Daily  ? umeclidinium  bromide  1 puff Inhalation Daily  ? ?Active Problems: ?  COPD with acute exacerbation ([place]) ?  DM (diabetes mellitus), type 2 ([place]) ?  Chronic respiratory failure with hypoxia ([facility]) ?  Hypokalemia ?  HLD (hyperlipidemia) ?  GERD (gastroesophageal reflux disease) ?  Hypertensive urgency ?  Thyroid nodule ?  Anxiety ? ? ?A & P  ? ?COPD exacerbation ?    - admit to inpt, progressive ?    - check ABG ?    - continue O2 support ?    - nebs ?    - resume home inhalers when confirmed ?    - CT chest as above ? ?Acute on chronic hypoxic respiratory failure: The patient is currently below her baseline  O2 requirements.  ? ?Constipation: The patient has not had a BM in 3 days. Stool softeners given today have not been successful. Suppository ordered. If no results by am, lactulose. ?     ?Hypertensive urgency ?    - Resolved.  ?  ?Hypokalemia ?    - repalce K+, Mg2+ is ok ?monitor ?  ?DM2 ?    - A1c, SSI, DM diet ?  ?HLD ?    - resume home statin when confirmed ?  ?GERD ?    - PPI ?  ?Thyroid nodule ?    - as seen on CT ?    - can get thyroid US outpt or after we get BP under control ?  ?Anxiety ?    - resume home regimen when confirmed ? ?I have seen and examined this patient myself. I have spent 34 minutes in her evaluation and care. ? ?DVT prophylaxis: Lovenox ?Code Status: Full Code ?Family Communication: Family is at bedside ?Disposition Plan: home  ? ? ?Eduard Penkala, DO ?[facility] ?Direct contact: see [email or web address]  ?7PM-7AM contact night coverage as above ?11/05/2021, 6:25 PM  LOS: 1 day  ? LOS: 2 days  ? ? ? ? ? ? ?  ?

## 2021-11-06 DIAGNOSIS — E785 Hyperlipidemia, unspecified: Secondary | ICD-10-CM

## 2021-11-06 LAB — CBC
HCT: 35.4 % — ABNORMAL LOW (ref 36.0–46.0)
Hemoglobin: 11.3 g/dL — ABNORMAL LOW (ref 12.0–15.0)
MCH: 28.7 pg (ref 26.0–34.0)
MCHC: 31.9 g/dL (ref 30.0–36.0)
MCV: 89.8 fL (ref 80.0–100.0)
Platelets: 124 10*3/uL — ABNORMAL LOW (ref 150–400)
RBC: 3.94 MIL/uL (ref 3.87–5.11)
RDW: 15.2 % (ref 11.5–15.5)
WBC: 7.4 10*3/uL (ref 4.0–10.5)
nRBC: 0 % (ref 0.0–0.2)

## 2021-11-06 LAB — BASIC METABOLIC PANEL
Anion gap: 7 (ref 5–15)
BUN: 26 mg/dL — ABNORMAL HIGH (ref 8–23)
CO2: 37 mmol/L — ABNORMAL HIGH (ref 22–32)
Calcium: 9.3 mg/dL (ref 8.9–10.3)
Chloride: 97 mmol/L — ABNORMAL LOW (ref 98–111)
Creatinine, Ser: 0.73 mg/dL (ref 0.44–1.00)
GFR, Estimated: >60 mL/min (ref >60)
Glucose, Bld: 127 mg/dL — ABNORMAL HIGH (ref 70–99)
Potassium: 3.4 mmol/L — ABNORMAL LOW (ref 3.5–5.1)
Sodium: 141 mmol/L (ref 135–145)

## 2021-11-06 LAB — GLUCOSE, CAPILLARY
Glucose-Capillary: 128 mg/dL — ABNORMAL HIGH (ref 70–99)
Glucose-Capillary: 245 mg/dL — ABNORMAL HIGH (ref 70–99)
Glucose-Capillary: 149 mg/dL — ABNORMAL HIGH (ref 70–99)
Glucose-Capillary: 197 mg/dL — ABNORMAL HIGH (ref 70–99)

## 2021-11-06 MED ORDER — POTASSIUM CHLORIDE CRYS ER 20 MEQ PO TBCR
40.0000 meq | EXTENDED_RELEASE_TABLET | Freq: Once | ORAL | Status: AC
  Administered 2021-11-06: 40 meq via ORAL
  Filled 2021-11-06: qty 2

## 2021-11-06 NOTE — Evaluation (Signed)
Physical Therapy Evaluation ?Patient Details ?Name: Amy [name] ?MRN: [id number] ?DOB: Feb 02, 1946 ?Today's Date: 11/06/2021 ? ?History of Present Illness ? Amy [name] is a 76 y.o. female with medical history significant of COPD, chronic hypoxic respiratory failure on 2L [place], HTN, DM2, HLD, anxiety, GERD. Presenting with dyspnea.  ?Clinical Impression ?  ?The  patient   reports that she has been up ambu;ating to BR on RA. Patient   resting on 2 l seated on bed. SPO2 100%. ?Patient ambulated x 50' on 2 L with SPO2 100%.  ?Patient instructed in pursed lip breathing, provided written instruction and showed Youtube video to her. Patient will benefit from HHPT to ensure she is back to baseline for taking care of  ADL's and house chores. ?Pt admitted with above diagnosis.  Pt currently with functional limitations due to the deficits listed below (see PT Problem List). Pt will benefit from skilled PT to increase their independence and safety with mobility to allow discharge to the venue listed below.   ? ?   ? ?Recommendations for follow up therapy are one component of a multi-disciplinary discharge planning process, led by the attending physician.  Recommendations may be updated based on patient status, additional functional criteria and insurance authorization. ? ?Follow Up Recommendations Home health PT ? ?  ?Assistance Recommended at Discharge    ?Patient can return home with the following ? Assist for transportation;Help with stairs or ramp for entrance ? ?  ?Equipment Recommendations None recommended by PT  ?Recommendations for Other Services ?    ?  ?Functional Status Assessment Patient has had a recent decline in their functional status and demonstrates the ability to make significant improvements in function in a reasonable and predictable amount of time.  ? ?  ?Precautions / Restrictions Precautions ?Precautions: Fall ?Precaution Comments: monitor sats  ? ?  ? ?Mobility ? Bed Mobility ?Overal bed mobility:  Independent ?  ?  ?  ?  ?  ?  ?  ?  ? ?Transfers ?Overall transfer level: Modified independent ?  ?  ?  ?  ?  ?  ?  ?  ?  ?  ? ?Ambulation/Gait ?Ambulation/Gait assistance: Min guard ?Gait Distance (Feet): 50 Feet ?Assistive device: None ?Gait Pattern/deviations: Step-through pattern ?Gait velocity: decr ?  ?  ?General Gait Details: gait  slow, did not have a cane, declined RW. ? ?Stairs ?  ?  ?  ?  ?  ? ?Wheelchair Mobility ?  ? ?Modified Rankin (Stroke Patients Only) ?  ? ?  ? ?Balance Overall balance assessment: Mild deficits observed, not formally tested ?  ?  ?  ?  ?  ?  ?  ?  ?  ?  ?  ?  ?  ?  ?  ?  ?  ?  ?   ? ? ? ?Pertinent Vitals/Pain Pain Assessment ?Pain Assessment: No/denies pain  ? ? ?Home Living Family/patient expects to be discharged to:: Private residence ?Living Arrangements: Alone ?Available Help at Discharge: Family;Friend(s);Available PRN/intermittently ?Type of Home: House ?Home Access: Level entry ?  ?  ?  ?Home Layout: One level ?Home Equipment: Kasandra Knudsen - single point ?   ?  ?Prior Function Prior Level of Function : Independent/Modified Independent ?  ?  ?  ?  ?  ?  ?Mobility Comments: drives, orders  groceries/delivered ?ADLs Comments: independnet ?  ? ? ?Hand Dominance  ? Dominant Hand: Right ? ?  ?Extremity/Trunk Assessment  ? Upper Extremity Assessment ?  Upper Extremity Assessment: Overall WFL for tasks assessed ?  ? ?Lower Extremity Assessment ?Lower Extremity Assessment: Overall WFL for tasks assessed ?  ? ?Cervical / Trunk Assessment ?Cervical / Trunk Assessment: Normal  ?Communication  ? Communication: No difficulties  ?Cognition Arousal/Alertness: Awake/alert ?Behavior During Therapy: [facility] for tasks assessed/performed ?Overall Cognitive Status: Within Functional Limits for tasks assessed ?  ?  ?  ?  ?  ?  ?  ?  ?  ?  ?  ?  ?  ?  ?  ?  ?General Comments: Patient was  noted to be dozing  as Youtube on pursed lip breathing was being played for her. she said that she was bvery sleepy  earlier. ?  ?  ? ?  ?General Comments   ? ?  ?Exercises Other Exercises ?Other Exercises: pursed lip breaths hand out and she watched youtube videao  ? ?Assessment/Plan  ?  ?PT Assessment Patient needs continued PT services  ?PT Problem List Decreased mobility;Cardiopulmonary status limiting activity;Decreased activity tolerance ? ?   ?  ?PT Treatment Interventions Therapeutic activities;Gait training;Functional mobility training;Patient/family education   ? ?PT Goals (Current goals can be found in the Care Plan section)  ?Acute Rehab PT Goals ?Patient Stated Goal: to go home ?PT Goal Formulation: With patient ?Time For Goal Achievement: 11/20/21 ?Potential to Achieve Goals: Good ? ?  ?Frequency Min 3X/week ?  ? ? ?Co-evaluation   ?  ?  ?  ?  ? ? ?  ?AM-PAC PT "6 Clicks" Mobility  ?Outcome Measure Help needed turning from your back to your side while in a flat bed without using bedrails?: None ?Help needed moving from lying on your back to sitting on the side of a flat bed without using bedrails?: None ?Help needed moving to and from a bed to a chair (including a wheelchair)?: None ?Help needed standing up from a chair using your arms (e.g., wheelchair or bedside chair)?: None ?Help needed to walk in hospital room?: A Little ?Help needed climbing 3-5 steps with a railing? : A Little ?6 Click Score: 22 ? ?  ?End of Session Equipment Utilized During Treatment: Oxygen ?Activity Tolerance: Patient limited by fatigue;Patient tolerated treatment well ?Patient left: in bed;with bed alarm set (seated on bed edge) ?Nurse Communication: Mobility status ?PT Visit Diagnosis: Unsteadiness on feet (R26.81);Difficulty in walking, not elsewhere classified (R26.2) ?  ? ?Time: 7948-0165 ?PT Time Calculation (min) (ACUTE ONLY): 50 min ? ? ?Charges:   PT Evaluation ?$PT Eval Low Complexity: 1 Low ?PT Treatments ?$Gait Training: 8-22 mins ?$Self Care/Home Management: 8-22 ?  ?   ? ? ?Tresa Endo PT ?Acute Rehabilitation Services ?Pager  [phone] ?Office [phone] ? ? ?Bryony Kaman, Shella Maxim ?11/06/2021, 12:57 PM ? ?

## 2021-11-06 NOTE — Progress Notes (Signed)
?PROGRESS NOTE ? ?ELNA RADOVICH GGY:[id number] DOB: 13-Jul-1946 DOA: 11/03/2021 ?PCP: Seward Carol, MD ? ?Brief History   ?Amy [name] is a 76 y.o. female with medical history significant of COPD, chronic hypoxic respiratory failure on 2L [place], HTN, DM2, HLD, anxiety, GERD. Presenting with dyspnea. She was in her normal state of health until last night. She acutely felt short of breath. She tried her inhalers and it did help her symptoms some. She increased her O2 to 3L. When she woke this morning, she still felt short of breath overall, so she called EMS. She denies fevers, sick contacts, swelling. She denies any other aggravating or alleviating factors.  ?   ?[facility] were consulted to admit the patient for further evaluation and care.  ? ?She was given nebulizer treatments and steroids. Viral respiratory panel was negative for respiratory pathogens. ?Her oxygen requirements at admission were 3L. It has been reduced to 2L. ? ?Today the patient is saturating 100% on 2L. Off of O2 at rest her oxygen saturations has been 93%. She does get short of breath with ambulation.  ? ?The patient had a BM last night. ? ?She states that she does not feel that today is a good day to leave the hospital, because she is fatigued and does not feel well. ? ?Consultants  ?None ? ?Procedures  ?None ? ?Antibiotics  ? ?Anti-infectives (From admission, onward)  ? ? None  ? ?  ? ? ?Subjective  ?The patient is resting comfortably. She complains of fatigue. ? ?Objective  ? ?Vitals:  ?Vitals:  ? 11/06/21 1535 11/06/21 1828  ?BP:    ?Pulse:    ?Resp:    ?Temp:    ?SpO2: 99% 99%  ? ? ?Exam: ? ?Constitutional:  ?The patient is awake, alert, and oriented x 3. No acute distress. ?Respiratory:  ?No increased work of breathing. ?No wheezes, rales, or rhonchi ?No tactile fremitus ?Cardiovascular:  ?Regular rate and rhythm ?No murmurs, ectopy, or gallups. ?No lateral PMI. No thrills. ?Abdomen:  ?Abdomen is soft, non-tender, non-distended ?No  hernias, masses, or organomegaly ?Normoactive bowel sounds.  ?Musculoskeletal:  ?No cyanosis, clubbing, or edema ?Skin:  ?No rashes, lesions, ulcers ?palpation of skin: no induration or nodules ?Neurologic:  ?CN 2-12 intact ?Sensation all 4 extremities intact ?Psychiatric:  ?Mental status ?Mood, affect appropriate ?Orientation to person, place, time  ?judgment and insight appear intact ? ? ?I have personally reviewed the following:  ? ?Today's Data  ?Vitals ? ?Lab Data  ?ABG ? ?Micro Data  ?Respiratory viral panel. ? ?Imaging  ?CT chest: No acute pathology, nodules that will need to be followed. ?CXR ? ?Cardiology Data  ?EKG ? ?Other Data  ? ? ?Scheduled Meds: ? albuterol  2.5 mg Nebulization TID  ? atorvastatin  20 mg Oral Daily  ? enoxaparin (LOVENOX) injection  40 mg Subcutaneous Q24H  ? hydrochlorothiazide  25 mg Oral QAC breakfast  ? insulin aspart  0-15 Units Subcutaneous TID WC  ? insulin aspart  0-5 Units Subcutaneous QHS  ? iron polysaccharides  150 mg Oral Daily  ? losartan  50 mg Oral Daily  ? mouth rinse  15 mL Mouth Rinse BID  ? metoprolol tartrate  100 mg Oral QAC breakfast  ? mometasone-formoterol  2 puff Inhalation BID  ? multivitamin with minerals  1 tablet Oral Daily  ? pantoprazole  40 mg Oral Daily  ? PARoxetine  40 mg Oral QAC breakfast  ? Ensure Max Protein  11 oz Oral Daily  ?  umeclidinium bromide  1 puff Inhalation Daily  ? ?Active Problems: ?  COPD with acute exacerbation ([place]) ?  DM (diabetes mellitus), type 2 ([place]) ?  Chronic respiratory failure with hypoxia (HCC) ?  Hypokalemia ?  HLD (hyperlipidemia) ?  GERD (gastroesophageal reflux disease) ?  Hypertensive urgency ?  Thyroid nodule ?  Anxiety ? ? ?A & P  ? ?COPD exacerbation ?    - admit to inpt, progressive ?    - check ABG ?    - continue O2 support ?    - nebs ?    - resume home inhalers when confirmed ?    - CT chest as above ? ?Acute on chronic hypoxic respiratory failure: The patient is currently below her baseline O2  requirements.  ? ?Constipation: Relieved. Continue miralax daily, ?     ?Hypertensive urgency ?    - Resolved.  ?  ?Hypokalemia ?    - repalce K+, Mg2+ is ok ?monitor ?  ?DM2 ?    - A1c, SSI, DM diet ?  ?HLD ?    - resume home statin when confirmed ?  ?GERD ?    - PPI ?  ?Thyroid nodule ?    - as seen on CT ?    - can get thyroid US outpt or after we get BP under control ?  ?Anxiety ?    - resume home regimen when confirmed ? ?I have seen and examined this patient myself. I have spent 32 minutes in her evaluation and care. ? ?DVT prophylaxis: Lovenox ?Code Status: Full Code ?Family Communication: Family is at bedside ?Disposition Plan: home  ? ? ?Yitzchok Carriger, DO ?[facility] ?Direct contact: see [email or web address]  ?7PM-7AM contact night coverage as above ?11/06/2021, 6:50 PM  LOS: 1 day  ? LOS: 3 days  ? ? ? ? ? ? ?  ?

## 2021-11-07 DIAGNOSIS — J9611 Chronic respiratory failure with hypoxia: Secondary | ICD-10-CM

## 2021-11-07 LAB — BASIC METABOLIC PANEL
Anion gap: 7 (ref 5–15)
BUN: 30 mg/dL — ABNORMAL HIGH (ref 8–23)
CO2: 38 mmol/L — ABNORMAL HIGH (ref 22–32)
Calcium: 9.6 mg/dL (ref 8.9–10.3)
Chloride: 96 mmol/L — ABNORMAL LOW (ref 98–111)
Creatinine, Ser: 0.7 mg/dL (ref 0.44–1.00)
GFR, Estimated: >60 mL/min (ref >60)
Glucose, Bld: 133 mg/dL — ABNORMAL HIGH (ref 70–99)
Potassium: 3.7 mmol/L (ref 3.5–5.1)
Sodium: 141 mmol/L (ref 135–145)

## 2021-11-07 LAB — GLUCOSE, CAPILLARY
Glucose-Capillary: 139 mg/dL — ABNORMAL HIGH (ref 70–99)
Glucose-Capillary: 168 mg/dL — ABNORMAL HIGH (ref 70–99)

## 2021-11-07 MED ORDER — POLYSACCHARIDE IRON COMPLEX 150 MG PO CAPS: 150.0000 mg | ORAL_CAPSULE | Freq: Every day | ORAL | 0 refills | Status: DC

## 2021-11-07 MED ORDER — GUAIFENESIN ER 600 MG PO TB12: 600.0000 mg | ORAL_TABLET | Freq: Two times a day (BID) | ORAL | 0 refills | Status: AC

## 2021-11-07 MED ORDER — ADULT MULTIVITAMIN W/MINERALS CH: 1.0000 | ORAL_TABLET | Freq: Every day | ORAL | 0 refills | Status: AC

## 2021-11-07 MED ORDER — GUAIFENESIN ER 600 MG PO TB12
600.0000 mg | ORAL_TABLET | Freq: Two times a day (BID) | ORAL | Status: DC
  Administered 2021-11-07: 600 mg via ORAL
  Filled 2021-11-07: qty 1

## 2021-11-07 MED ORDER — ENSURE MAX PROTEIN PO LIQD: 11.0000 [oz_av] | Freq: Every day | ORAL | 0 refills | Status: DC

## 2021-11-07 NOTE — Discharge Summary (Signed)
?Physician Discharge Summary ?  ?Patient: Amy [name] MRN: [id number] DOB: 1945-11-28  ?Admit date:     11/03/2021  ?Discharge date: 11/07/21  ?Discharge Physician: Shalin Vonbargen  ? ?PCP: Seward Carol, MD  ? ?Recommendations at discharge:  ? ? Discharge to home on baseline O2 requirements with home health for PT and RT. ?Follow up with PCP in 7-10 days. ?Follow up with Dr. Ancil Boozer at next available appointment. ? ?Discharge Diagnoses: ?Active Problems: ?  COPD with acute exacerbation ([place]) ?  DM (diabetes mellitus), type 2 ([place]) ?  Chronic respiratory failure with hypoxia (HCC) ?  Hypokalemia ?  HLD (hyperlipidemia) ?  GERD (gastroesophageal reflux disease) ?  Hypertensive urgency ?  Thyroid nodule ?  Anxiety ? ? ?Hospital Course: ?Amy [name] is a 76 y.o. female with medical history significant of COPD, chronic hypoxic respiratory failure on 2L [place], HTN, DM2, HLD, anxiety, GERD. Presenting with dyspnea. She was in her normal state of health until last night. She acutely felt short of breath. She tried her inhalers and it did help her symptoms some. She increased her O2 to 3L. When she woke this morning, she still felt short of breath overall, so she called EMS. She denies fevers, sick contacts, swelling. She denies any other aggravating or alleviating factors.  ?   ?[facility] were consulted to admit the patient for further evaluation and care.  ?  ?She was given nebulizer treatments and steroids. Viral respiratory panel was negative for respiratory pathogens. ?Her oxygen requirements at admission were 3L. It has been reduced to 2L. ?  ?Today the patient is saturating 100% on 2L. Off of O2 at rest her oxygen saturations has been 93%. She does get short of breath with ambulation.  ? ?She was constipated, but she has had a BM in the last 24 hours.  ? ?She will be discharged to home on her baseline 2L O2 and with home health PT and RT. ? ?She will follow up with her PCP and Pulmonology ? ?Assessment and  Plan: ?COPD exacerbation ?    - Resolved ?- the patient is at her baseline O2 requirements ? ?Acute on chronic hypoxic respiratory failure: Resolved. The patient is currently on or below her baseline O2 requirements.  ?  ?Constipation: Relieved. Continue miralax daily, ?     ?Hypertensive urgency ?    - Resolved.  ?  ?Hypokalemia ?    - K+, Mg2+ have been replaced. ?  ?DM2 ?    - Continue home glucose control ?  ?HLD ?    - resume home statin when confirmed ?  ?GERD ?    - PPI ?  ?Thyroid nodule ?    - as seen on CT ?    - can get thyroid US outpt or after we get BP under control ?  ?Anxiety ?    - resume home regimen when confirmed ? ? ?Consultants: None ?Procedures performed: None  ?Disposition: Home health ?Diet recommendation:  ?Discharge Diet Orders (From admission, onward)  ? ?  Start     Ordered  ? 11/07/21 0000  Diet - low sodium heart healthy       ? 11/07/21 1459  ? ?  ?  ? ?  ? ?Cardiac and Carb modified diet ?DISCHARGE MEDICATION: ?Allergies as of 11/07/2021   ? ?   Reactions  ? Crab [shellfish Allergy] Anaphylaxis  ? Iodine Anaphylaxis  ? Albuterol-ipratropium [ipratropium-albuterol]   ? Tremors, blurred vision.   ?  Incruse Ellipta [umeclidinium Bromide] Other (See Comments)  ? Trembling, blurred vision  ? Iohexol   ?  Code: HIVES, Desc: pt states , swelling mouth , throat, and has difficulty breathing  ? Lactose Intolerance (gi)   ? Adhesive [tape] Rash  ? Latex Itching, Rash  ? ?  ? ?  ?Medication List  ?  ? ?TAKE these medications   ? ?acetaminophen 500 MG tablet ?Commonly known as: TYLENOL ?Take 1,000 mg by mouth every 6 (six) hours as needed for moderate pain. ?  ?ALPRAZolam 1 MG tablet ?Commonly known as: Duanne Moron ?Take 0.5 mg by mouth daily as needed for anxiety. ?  ?atorvastatin 20 MG tablet ?Commonly known as: LIPITOR ?Take 20 mg by mouth daily. ?  ?budesonide-formoterol 160-4.5 MCG/ACT inhaler ?Commonly known as: Symbicort ?Inhale 2 puffs into the lungs 2 (two) times daily. ?  ?calcium-vitamin D  500-200 MG-UNIT tablet ?Take 1 tablet by mouth every Monday, Wednesday, and Friday. ?  ?Ensure Max Protein Liqd ?Take 330 mLs (11 oz total) by mouth daily. ?Start taking on: November 08, 2021 ?  ?guaiFENesin 600 MG 12 hr tablet ?Commonly known as: [place] ?Take 1 tablet (600 mg total) by mouth 2 (two) times daily. ?  ?hydrochlorothiazide 25 MG tablet ?Commonly known as: HYDRODIURIL ?Take 25 mg by mouth daily before breakfast. ?  ?Iron 240 (27 Fe) MG Tabs ?Take 1 tablet by mouth every morning. ?  ?losartan 50 MG tablet ?Commonly known as: COZAAR ?Take 50 mg by mouth daily. ?  ?metFORMIN 500 MG tablet ?Commonly known as: GLUCOPHAGE ?Take 500 mg by mouth daily with breakfast. ?  ?metoprolol tartrate 100 MG tablet ?Commonly known as: LOPRESSOR ?Take 100 mg by mouth daily before breakfast. ?  ?multivitamin with minerals Tabs tablet ?Take 1 tablet by mouth daily. ?Start taking on: November 08, 2021 ?  ?omeprazole 20 MG capsule ?Commonly known as: PRILOSEC ?Take 20 mg by mouth daily before breakfast. ?  ?OXYGEN ?Inhale 2 L into the lungs continuous. ?  ?PARoxetine 40 MG tablet ?Commonly known as: PAXIL ?Take 40 mg by mouth daily before breakfast. ?  ?ProAir RespiClick 242 (90 Base) MCG/ACT Aepb ?Generic drug: Albuterol Sulfate ?Inhale 2 puffs into the lungs every 8 (eight) hours as needed (wheezing/shortness of breath). ?  ?Spiriva Respimat 1.25 MCG/ACT Aers ?Generic drug: Tiotropium Bromide Monohydrate ?Inhale 2 puffs into the lungs daily. ?What changed: when to take this ?  ?traMADol 50 MG tablet ?Commonly known as: ULTRAM ?Take 50 mg by mouth every 12 (twelve) hours as needed for severe pain. ?  ?vitamin C with rose hips 500 MG tablet ?Take 500 mg by mouth daily. ?  ? ?  ? ? ?Discharge Exam: ?[name]  ? 11/03/21 2329  ?Weight: 109.2 kg  ? ?Exam: ? ?Constitutional:  ?The patient is awake, alert, and oriented x 3. No acute distress. ?Respiratory:  ?No increased work of breathing. ?No wheezes, rales, or rhonchi ?No tactile  fremitus ?Cardiovascular:  ?Regular rate and rhythm ?No murmurs, ectopy, or gallups. ?No lateral PMI. No thrills. ?Abdomen:  ?Abdomen is soft, non-tender, non-distended ?No hernias, masses, or organomegaly ?Normoactive bowel sounds.  ?Musculoskeletal:  ?No cyanosis, clubbing, or edema ?Skin:  ?No rashes, lesions, ulcers ?palpation of skin: no induration or nodules ?Neurologic:  ?CN 2-12 intact ?Sensation all 4 extremities intact ?Psychiatric:  ?Mental status ?Mood, affect appropriate ?Orientation to person, place, time  ?judgment and insight appear intact ? ? ?Condition at discharge: fair ? ?The results of significant diagnostics from this  hospitalization (including imaging, microbiology, ancillary and laboratory) are listed below for reference.  ? ?Imaging Studies: ?CT Chest Wo Contrast ? ?Result Date: 11/03/2021 ?CLINICAL DATA:  Respiratory illness. EXAM: CT CHEST WITHOUT CONTRAST TECHNIQUE: Multidetector CT imaging of the chest was performed following the standard protocol without IV contrast. RADIATION DOSE REDUCTION: This exam was performed according to the departmental dose-optimization program which includes automated exposure control, adjustment of the mA and/or kV according to patient size and/or use of iterative reconstruction technique. COMPARISON:  December 27, 2016. FINDINGS: Cardiovascular: Atherosclerosis of thoracic aorta is noted without aneurysm formation. Normal cardiac size. No pericardial effusion. Enlarged pulmonary artery is noted suggesting pulmonary artery hypertension. Mediastinum/Nodes: No enlarged mediastinal or axillary lymph nodes. 1.6 cm left thyroid nodule is noted. Trachea and esophagus demonstrate no significant findings. Lungs/Pleura: No pneumothorax or pleural effusion is noted. Mild bibasilar subsegmental atelectasis or scarring is noted. 5 mm nodule is seen in posterior portion of left upper lobe adjacent to major fissure best seen on image number 31 of series 5. No acute  consolidative process is noted. Upper Abdomen: Status post right nephrectomy. Musculoskeletal: No chest wall mass or suspicious bone lesions identified. IMPRESSION: 1.6 cm left thyroid nodule. Recommend thyroid US.

## 2021-11-07 NOTE — Progress Notes (Signed)
?  Transition of Care (TOC) Screening Note ? ? ?Patient Details  ?Name: Amy [name] ?Date of Birth: 10-17-1945 ? ? ?Transition of Care (TOC) CM/SW Contact:    ?Roseanne Kaufman, RN ?Phone Number: ?11/07/2021, 3:48 PM ? ? ? ?Transition of Care Department [facility]) has reviewed patient and no TOC needs have been identified at this time. We will continue to monitor patient advancement through interdisciplinary progression rounds. If new patient transition needs arise, please place a TOC consult. ?  ?

## 2021-11-07 NOTE — TOC Transition Note (Signed)
Transition of Care (TOC) - CM/SW Discharge Note ? ? ?Patient Details  ?Name: Amy [name] ?MRN: [id number] ?Date of Birth: Aug 03, 1945 ? ?Transition of Care (TOC) CM/SW Contact:  ?Tawanna Cooler, RN ?Phone Number: ?11/07/2021, 5:24 PM ? ? ?Clinical Narrative:    ? ?Patient does not have a ride home.  Gave nurse a taxi voucher for patient.   ? ?Patient will need a travel tank to get home.  She states she has home O2 through [place].  Called [employer] at [phone].  Was told [facility] would deliver the travel tank.  ? ?Patient has an order for home health.  Reached out to [facility] rep, Tommi Rumps, who states they can accept the referral.  [facility] info added to the AVS.  ? ?No further TOC needs.  ? ?

## 2021-11-11 DIAGNOSIS — J439 Emphysema, unspecified: Secondary | ICD-10-CM | POA: Diagnosis not present

## 2021-11-11 DIAGNOSIS — Z87891 Personal history of nicotine dependence: Secondary | ICD-10-CM | POA: Diagnosis not present

## 2021-11-11 DIAGNOSIS — Z905 Acquired absence of kidney: Secondary | ICD-10-CM | POA: Diagnosis not present

## 2021-11-11 DIAGNOSIS — Z85038 Personal history of other malignant neoplasm of large intestine: Secondary | ICD-10-CM | POA: Diagnosis not present

## 2021-11-11 DIAGNOSIS — I129 Hypertensive chronic kidney disease with stage 1 through stage 4 chronic kidney disease, or unspecified chronic kidney disease: Secondary | ICD-10-CM | POA: Diagnosis not present

## 2021-11-11 DIAGNOSIS — E041 Nontoxic single thyroid nodule: Secondary | ICD-10-CM | POA: Diagnosis not present

## 2021-11-11 DIAGNOSIS — J449 Chronic obstructive pulmonary disease, unspecified: Secondary | ICD-10-CM | POA: Diagnosis not present

## 2021-11-11 DIAGNOSIS — E1142 Type 2 diabetes mellitus with diabetic polyneuropathy: Secondary | ICD-10-CM | POA: Diagnosis not present

## 2021-11-11 DIAGNOSIS — K219 Gastro-esophageal reflux disease without esophagitis: Secondary | ICD-10-CM | POA: Diagnosis not present

## 2021-11-11 DIAGNOSIS — Z85118 Personal history of other malignant neoplasm of bronchus and lung: Secondary | ICD-10-CM | POA: Diagnosis not present

## 2021-11-11 DIAGNOSIS — Z9981 Dependence on supplemental oxygen: Secondary | ICD-10-CM | POA: Diagnosis not present

## 2021-11-11 DIAGNOSIS — E1122 Type 2 diabetes mellitus with diabetic chronic kidney disease: Secondary | ICD-10-CM | POA: Diagnosis not present

## 2021-11-11 DIAGNOSIS — Z7951 Long term (current) use of inhaled steroids: Secondary | ICD-10-CM | POA: Diagnosis not present

## 2021-11-11 DIAGNOSIS — Z7984 Long term (current) use of oral hypoglycemic drugs: Secondary | ICD-10-CM | POA: Diagnosis not present

## 2021-11-11 DIAGNOSIS — E785 Hyperlipidemia, unspecified: Secondary | ICD-10-CM | POA: Diagnosis not present

## 2021-11-11 DIAGNOSIS — Z853 Personal history of malignant neoplasm of breast: Secondary | ICD-10-CM | POA: Diagnosis not present

## 2021-11-11 DIAGNOSIS — D631 Anemia in chronic kidney disease: Secondary | ICD-10-CM | POA: Diagnosis not present

## 2021-11-11 DIAGNOSIS — F32A Depression, unspecified: Secondary | ICD-10-CM | POA: Diagnosis not present

## 2021-11-11 DIAGNOSIS — Z9181 History of falling: Secondary | ICD-10-CM | POA: Diagnosis not present

## 2021-11-11 DIAGNOSIS — N189 Chronic kidney disease, unspecified: Secondary | ICD-10-CM | POA: Diagnosis not present

## 2021-11-17 DIAGNOSIS — Z9181 History of falling: Secondary | ICD-10-CM | POA: Diagnosis not present

## 2021-11-17 DIAGNOSIS — J441 Chronic obstructive pulmonary disease with (acute) exacerbation: Secondary | ICD-10-CM | POA: Diagnosis not present

## 2021-11-17 DIAGNOSIS — Z9981 Dependence on supplemental oxygen: Secondary | ICD-10-CM | POA: Diagnosis not present

## 2021-11-17 DIAGNOSIS — E1122 Type 2 diabetes mellitus with diabetic chronic kidney disease: Secondary | ICD-10-CM | POA: Diagnosis not present

## 2021-11-17 DIAGNOSIS — Z85118 Personal history of other malignant neoplasm of bronchus and lung: Secondary | ICD-10-CM | POA: Diagnosis not present

## 2021-11-17 DIAGNOSIS — Z7984 Long term (current) use of oral hypoglycemic drugs: Secondary | ICD-10-CM | POA: Diagnosis not present

## 2021-11-17 DIAGNOSIS — Z7951 Long term (current) use of inhaled steroids: Secondary | ICD-10-CM | POA: Diagnosis not present

## 2021-11-17 DIAGNOSIS — N189 Chronic kidney disease, unspecified: Secondary | ICD-10-CM | POA: Diagnosis not present

## 2021-11-17 DIAGNOSIS — Z905 Acquired absence of kidney: Secondary | ICD-10-CM | POA: Diagnosis not present

## 2021-11-17 DIAGNOSIS — K219 Gastro-esophageal reflux disease without esophagitis: Secondary | ICD-10-CM | POA: Diagnosis not present

## 2021-11-17 DIAGNOSIS — E7800 Pure hypercholesterolemia, unspecified: Secondary | ICD-10-CM | POA: Diagnosis not present

## 2021-11-17 DIAGNOSIS — E1169 Type 2 diabetes mellitus with other specified complication: Secondary | ICD-10-CM | POA: Diagnosis not present

## 2021-11-17 DIAGNOSIS — Z87891 Personal history of nicotine dependence: Secondary | ICD-10-CM | POA: Diagnosis not present

## 2021-11-17 DIAGNOSIS — J449 Chronic obstructive pulmonary disease, unspecified: Secondary | ICD-10-CM | POA: Diagnosis not present

## 2021-11-17 DIAGNOSIS — F32A Depression, unspecified: Secondary | ICD-10-CM | POA: Diagnosis not present

## 2021-11-17 DIAGNOSIS — D631 Anemia in chronic kidney disease: Secondary | ICD-10-CM | POA: Diagnosis not present

## 2021-11-17 DIAGNOSIS — E041 Nontoxic single thyroid nodule: Secondary | ICD-10-CM | POA: Diagnosis not present

## 2021-11-17 DIAGNOSIS — I10 Essential (primary) hypertension: Secondary | ICD-10-CM | POA: Diagnosis not present

## 2021-11-17 DIAGNOSIS — E1142 Type 2 diabetes mellitus with diabetic polyneuropathy: Secondary | ICD-10-CM | POA: Diagnosis not present

## 2021-11-17 DIAGNOSIS — Z853 Personal history of malignant neoplasm of breast: Secondary | ICD-10-CM | POA: Diagnosis not present

## 2021-11-17 DIAGNOSIS — I129 Hypertensive chronic kidney disease with stage 1 through stage 4 chronic kidney disease, or unspecified chronic kidney disease: Secondary | ICD-10-CM | POA: Diagnosis not present

## 2021-11-17 DIAGNOSIS — Z85038 Personal history of other malignant neoplasm of large intestine: Secondary | ICD-10-CM | POA: Diagnosis not present

## 2021-11-17 DIAGNOSIS — E785 Hyperlipidemia, unspecified: Secondary | ICD-10-CM | POA: Diagnosis not present

## 2021-11-18 DIAGNOSIS — Z853 Personal history of malignant neoplasm of breast: Secondary | ICD-10-CM | POA: Diagnosis not present

## 2021-11-18 DIAGNOSIS — E041 Nontoxic single thyroid nodule: Secondary | ICD-10-CM | POA: Diagnosis not present

## 2021-11-18 DIAGNOSIS — E1142 Type 2 diabetes mellitus with diabetic polyneuropathy: Secondary | ICD-10-CM | POA: Diagnosis not present

## 2021-11-18 DIAGNOSIS — E1122 Type 2 diabetes mellitus with diabetic chronic kidney disease: Secondary | ICD-10-CM | POA: Diagnosis not present

## 2021-11-18 DIAGNOSIS — Z7984 Long term (current) use of oral hypoglycemic drugs: Secondary | ICD-10-CM | POA: Diagnosis not present

## 2021-11-18 DIAGNOSIS — Z85118 Personal history of other malignant neoplasm of bronchus and lung: Secondary | ICD-10-CM | POA: Diagnosis not present

## 2021-11-18 DIAGNOSIS — N189 Chronic kidney disease, unspecified: Secondary | ICD-10-CM | POA: Diagnosis not present

## 2021-11-18 DIAGNOSIS — Z9181 History of falling: Secondary | ICD-10-CM | POA: Diagnosis not present

## 2021-11-18 DIAGNOSIS — Z85038 Personal history of other malignant neoplasm of large intestine: Secondary | ICD-10-CM | POA: Diagnosis not present

## 2021-11-18 DIAGNOSIS — J449 Chronic obstructive pulmonary disease, unspecified: Secondary | ICD-10-CM | POA: Diagnosis not present

## 2021-11-18 DIAGNOSIS — K219 Gastro-esophageal reflux disease without esophagitis: Secondary | ICD-10-CM | POA: Diagnosis not present

## 2021-11-18 DIAGNOSIS — Z87891 Personal history of nicotine dependence: Secondary | ICD-10-CM | POA: Diagnosis not present

## 2021-11-18 DIAGNOSIS — Z7951 Long term (current) use of inhaled steroids: Secondary | ICD-10-CM | POA: Diagnosis not present

## 2021-11-18 DIAGNOSIS — Z9981 Dependence on supplemental oxygen: Secondary | ICD-10-CM | POA: Diagnosis not present

## 2021-11-18 DIAGNOSIS — D631 Anemia in chronic kidney disease: Secondary | ICD-10-CM | POA: Diagnosis not present

## 2021-11-18 DIAGNOSIS — E785 Hyperlipidemia, unspecified: Secondary | ICD-10-CM | POA: Diagnosis not present

## 2021-11-18 DIAGNOSIS — F32A Depression, unspecified: Secondary | ICD-10-CM | POA: Diagnosis not present

## 2021-11-18 DIAGNOSIS — Z905 Acquired absence of kidney: Secondary | ICD-10-CM | POA: Diagnosis not present

## 2021-11-18 DIAGNOSIS — I129 Hypertensive chronic kidney disease with stage 1 through stage 4 chronic kidney disease, or unspecified chronic kidney disease: Secondary | ICD-10-CM | POA: Diagnosis not present

## 2021-11-19 DIAGNOSIS — E041 Nontoxic single thyroid nodule: Secondary | ICD-10-CM | POA: Diagnosis not present

## 2021-11-19 DIAGNOSIS — E1169 Type 2 diabetes mellitus with other specified complication: Secondary | ICD-10-CM | POA: Diagnosis not present

## 2021-11-19 DIAGNOSIS — J441 Chronic obstructive pulmonary disease with (acute) exacerbation: Secondary | ICD-10-CM | POA: Diagnosis not present

## 2021-11-19 DIAGNOSIS — R600 Localized edema: Secondary | ICD-10-CM | POA: Diagnosis not present

## 2021-11-19 DIAGNOSIS — J9611 Chronic respiratory failure with hypoxia: Secondary | ICD-10-CM | POA: Diagnosis not present

## 2021-11-19 DIAGNOSIS — C50411 Malignant neoplasm of upper-outer quadrant of right female breast: Secondary | ICD-10-CM | POA: Diagnosis not present

## 2021-11-19 DIAGNOSIS — E8770 Fluid overload, unspecified: Secondary | ICD-10-CM | POA: Diagnosis not present

## 2021-11-19 DIAGNOSIS — I10 Essential (primary) hypertension: Secondary | ICD-10-CM | POA: Diagnosis not present

## 2021-11-23 DIAGNOSIS — Z7984 Long term (current) use of oral hypoglycemic drugs: Secondary | ICD-10-CM | POA: Diagnosis not present

## 2021-11-23 DIAGNOSIS — Z85118 Personal history of other malignant neoplasm of bronchus and lung: Secondary | ICD-10-CM | POA: Diagnosis not present

## 2021-11-23 DIAGNOSIS — K219 Gastro-esophageal reflux disease without esophagitis: Secondary | ICD-10-CM | POA: Diagnosis not present

## 2021-11-23 DIAGNOSIS — N189 Chronic kidney disease, unspecified: Secondary | ICD-10-CM | POA: Diagnosis not present

## 2021-11-23 DIAGNOSIS — D631 Anemia in chronic kidney disease: Secondary | ICD-10-CM | POA: Diagnosis not present

## 2021-11-23 DIAGNOSIS — Z853 Personal history of malignant neoplasm of breast: Secondary | ICD-10-CM | POA: Diagnosis not present

## 2021-11-23 DIAGNOSIS — I129 Hypertensive chronic kidney disease with stage 1 through stage 4 chronic kidney disease, or unspecified chronic kidney disease: Secondary | ICD-10-CM | POA: Diagnosis not present

## 2021-11-23 DIAGNOSIS — E785 Hyperlipidemia, unspecified: Secondary | ICD-10-CM | POA: Diagnosis not present

## 2021-11-23 DIAGNOSIS — E1142 Type 2 diabetes mellitus with diabetic polyneuropathy: Secondary | ICD-10-CM | POA: Diagnosis not present

## 2021-11-23 DIAGNOSIS — Z9981 Dependence on supplemental oxygen: Secondary | ICD-10-CM | POA: Diagnosis not present

## 2021-11-23 DIAGNOSIS — F32A Depression, unspecified: Secondary | ICD-10-CM | POA: Diagnosis not present

## 2021-11-23 DIAGNOSIS — Z7951 Long term (current) use of inhaled steroids: Secondary | ICD-10-CM | POA: Diagnosis not present

## 2021-11-23 DIAGNOSIS — Z85038 Personal history of other malignant neoplasm of large intestine: Secondary | ICD-10-CM | POA: Diagnosis not present

## 2021-11-23 DIAGNOSIS — Z87891 Personal history of nicotine dependence: Secondary | ICD-10-CM | POA: Diagnosis not present

## 2021-11-23 DIAGNOSIS — E041 Nontoxic single thyroid nodule: Secondary | ICD-10-CM | POA: Diagnosis not present

## 2021-11-23 DIAGNOSIS — J449 Chronic obstructive pulmonary disease, unspecified: Secondary | ICD-10-CM | POA: Diagnosis not present

## 2021-11-23 DIAGNOSIS — E1122 Type 2 diabetes mellitus with diabetic chronic kidney disease: Secondary | ICD-10-CM | POA: Diagnosis not present

## 2021-11-23 DIAGNOSIS — Z905 Acquired absence of kidney: Secondary | ICD-10-CM | POA: Diagnosis not present

## 2021-11-23 DIAGNOSIS — Z9181 History of falling: Secondary | ICD-10-CM | POA: Diagnosis not present

## 2021-11-25 DIAGNOSIS — F32A Depression, unspecified: Secondary | ICD-10-CM | POA: Diagnosis not present

## 2021-11-25 DIAGNOSIS — Z85118 Personal history of other malignant neoplasm of bronchus and lung: Secondary | ICD-10-CM | POA: Diagnosis not present

## 2021-11-25 DIAGNOSIS — I129 Hypertensive chronic kidney disease with stage 1 through stage 4 chronic kidney disease, or unspecified chronic kidney disease: Secondary | ICD-10-CM | POA: Diagnosis not present

## 2021-11-25 DIAGNOSIS — Z85038 Personal history of other malignant neoplasm of large intestine: Secondary | ICD-10-CM | POA: Diagnosis not present

## 2021-11-25 DIAGNOSIS — E785 Hyperlipidemia, unspecified: Secondary | ICD-10-CM | POA: Diagnosis not present

## 2021-11-25 DIAGNOSIS — E1122 Type 2 diabetes mellitus with diabetic chronic kidney disease: Secondary | ICD-10-CM | POA: Diagnosis not present

## 2021-11-25 DIAGNOSIS — N189 Chronic kidney disease, unspecified: Secondary | ICD-10-CM | POA: Diagnosis not present

## 2021-11-25 DIAGNOSIS — Z7951 Long term (current) use of inhaled steroids: Secondary | ICD-10-CM | POA: Diagnosis not present

## 2021-11-25 DIAGNOSIS — D631 Anemia in chronic kidney disease: Secondary | ICD-10-CM | POA: Diagnosis not present

## 2021-11-25 DIAGNOSIS — K219 Gastro-esophageal reflux disease without esophagitis: Secondary | ICD-10-CM | POA: Diagnosis not present

## 2021-11-25 DIAGNOSIS — J449 Chronic obstructive pulmonary disease, unspecified: Secondary | ICD-10-CM | POA: Diagnosis not present

## 2021-11-25 DIAGNOSIS — Z9981 Dependence on supplemental oxygen: Secondary | ICD-10-CM | POA: Diagnosis not present

## 2021-11-25 DIAGNOSIS — Z87891 Personal history of nicotine dependence: Secondary | ICD-10-CM | POA: Diagnosis not present

## 2021-11-25 DIAGNOSIS — Z7984 Long term (current) use of oral hypoglycemic drugs: Secondary | ICD-10-CM | POA: Diagnosis not present

## 2021-11-25 DIAGNOSIS — Z9181 History of falling: Secondary | ICD-10-CM | POA: Diagnosis not present

## 2021-11-25 DIAGNOSIS — Z905 Acquired absence of kidney: Secondary | ICD-10-CM | POA: Diagnosis not present

## 2021-11-25 DIAGNOSIS — E1142 Type 2 diabetes mellitus with diabetic polyneuropathy: Secondary | ICD-10-CM | POA: Diagnosis not present

## 2021-11-25 DIAGNOSIS — Z853 Personal history of malignant neoplasm of breast: Secondary | ICD-10-CM | POA: Diagnosis not present

## 2021-11-25 DIAGNOSIS — E041 Nontoxic single thyroid nodule: Secondary | ICD-10-CM | POA: Diagnosis not present

## 2021-11-29 DIAGNOSIS — Z87891 Personal history of nicotine dependence: Secondary | ICD-10-CM | POA: Diagnosis not present

## 2021-11-29 DIAGNOSIS — E1122 Type 2 diabetes mellitus with diabetic chronic kidney disease: Secondary | ICD-10-CM | POA: Diagnosis not present

## 2021-11-29 DIAGNOSIS — F32A Depression, unspecified: Secondary | ICD-10-CM | POA: Diagnosis not present

## 2021-11-29 DIAGNOSIS — Z7984 Long term (current) use of oral hypoglycemic drugs: Secondary | ICD-10-CM | POA: Diagnosis not present

## 2021-11-29 DIAGNOSIS — Z85118 Personal history of other malignant neoplasm of bronchus and lung: Secondary | ICD-10-CM | POA: Diagnosis not present

## 2021-11-29 DIAGNOSIS — K219 Gastro-esophageal reflux disease without esophagitis: Secondary | ICD-10-CM | POA: Diagnosis not present

## 2021-11-29 DIAGNOSIS — J449 Chronic obstructive pulmonary disease, unspecified: Secondary | ICD-10-CM | POA: Diagnosis not present

## 2021-11-29 DIAGNOSIS — Z853 Personal history of malignant neoplasm of breast: Secondary | ICD-10-CM | POA: Diagnosis not present

## 2021-11-29 DIAGNOSIS — E041 Nontoxic single thyroid nodule: Secondary | ICD-10-CM | POA: Diagnosis not present

## 2021-11-29 DIAGNOSIS — Z9981 Dependence on supplemental oxygen: Secondary | ICD-10-CM | POA: Diagnosis not present

## 2021-11-29 DIAGNOSIS — Z85038 Personal history of other malignant neoplasm of large intestine: Secondary | ICD-10-CM | POA: Diagnosis not present

## 2021-11-29 DIAGNOSIS — I129 Hypertensive chronic kidney disease with stage 1 through stage 4 chronic kidney disease, or unspecified chronic kidney disease: Secondary | ICD-10-CM | POA: Diagnosis not present

## 2021-11-29 DIAGNOSIS — Z9181 History of falling: Secondary | ICD-10-CM | POA: Diagnosis not present

## 2021-11-29 DIAGNOSIS — N189 Chronic kidney disease, unspecified: Secondary | ICD-10-CM | POA: Diagnosis not present

## 2021-11-29 DIAGNOSIS — E785 Hyperlipidemia, unspecified: Secondary | ICD-10-CM | POA: Diagnosis not present

## 2021-11-29 DIAGNOSIS — D631 Anemia in chronic kidney disease: Secondary | ICD-10-CM | POA: Diagnosis not present

## 2021-11-29 DIAGNOSIS — E1142 Type 2 diabetes mellitus with diabetic polyneuropathy: Secondary | ICD-10-CM | POA: Diagnosis not present

## 2021-11-29 DIAGNOSIS — Z7951 Long term (current) use of inhaled steroids: Secondary | ICD-10-CM | POA: Diagnosis not present

## 2021-11-29 DIAGNOSIS — Z905 Acquired absence of kidney: Secondary | ICD-10-CM | POA: Diagnosis not present

## 2021-12-01 DIAGNOSIS — J439 Emphysema, unspecified: Secondary | ICD-10-CM | POA: Diagnosis not present

## 2021-12-07 DIAGNOSIS — Z85038 Personal history of other malignant neoplasm of large intestine: Secondary | ICD-10-CM | POA: Diagnosis not present

## 2021-12-07 DIAGNOSIS — F32A Depression, unspecified: Secondary | ICD-10-CM | POA: Diagnosis not present

## 2021-12-07 DIAGNOSIS — Z85118 Personal history of other malignant neoplasm of bronchus and lung: Secondary | ICD-10-CM | POA: Diagnosis not present

## 2021-12-07 DIAGNOSIS — Z905 Acquired absence of kidney: Secondary | ICD-10-CM | POA: Diagnosis not present

## 2021-12-07 DIAGNOSIS — N189 Chronic kidney disease, unspecified: Secondary | ICD-10-CM | POA: Diagnosis not present

## 2021-12-07 DIAGNOSIS — Z9981 Dependence on supplemental oxygen: Secondary | ICD-10-CM | POA: Diagnosis not present

## 2021-12-07 DIAGNOSIS — J449 Chronic obstructive pulmonary disease, unspecified: Secondary | ICD-10-CM | POA: Diagnosis not present

## 2021-12-07 DIAGNOSIS — Z7951 Long term (current) use of inhaled steroids: Secondary | ICD-10-CM | POA: Diagnosis not present

## 2021-12-07 DIAGNOSIS — E785 Hyperlipidemia, unspecified: Secondary | ICD-10-CM | POA: Diagnosis not present

## 2021-12-07 DIAGNOSIS — D631 Anemia in chronic kidney disease: Secondary | ICD-10-CM | POA: Diagnosis not present

## 2021-12-07 DIAGNOSIS — E1122 Type 2 diabetes mellitus with diabetic chronic kidney disease: Secondary | ICD-10-CM | POA: Diagnosis not present

## 2021-12-07 DIAGNOSIS — I129 Hypertensive chronic kidney disease with stage 1 through stage 4 chronic kidney disease, or unspecified chronic kidney disease: Secondary | ICD-10-CM | POA: Diagnosis not present

## 2021-12-07 DIAGNOSIS — E1142 Type 2 diabetes mellitus with diabetic polyneuropathy: Secondary | ICD-10-CM | POA: Diagnosis not present

## 2021-12-07 DIAGNOSIS — Z853 Personal history of malignant neoplasm of breast: Secondary | ICD-10-CM | POA: Diagnosis not present

## 2021-12-07 DIAGNOSIS — Z7984 Long term (current) use of oral hypoglycemic drugs: Secondary | ICD-10-CM | POA: Diagnosis not present

## 2021-12-07 DIAGNOSIS — Z9181 History of falling: Secondary | ICD-10-CM | POA: Diagnosis not present

## 2021-12-07 DIAGNOSIS — K219 Gastro-esophageal reflux disease without esophagitis: Secondary | ICD-10-CM | POA: Diagnosis not present

## 2021-12-07 DIAGNOSIS — Z87891 Personal history of nicotine dependence: Secondary | ICD-10-CM | POA: Diagnosis not present

## 2021-12-07 DIAGNOSIS — E041 Nontoxic single thyroid nodule: Secondary | ICD-10-CM | POA: Diagnosis not present

## 2021-12-10 ENCOUNTER — Ambulatory Visit
Admission: RE | Admit: 2021-12-10 | Discharge: 2021-12-10 | Disposition: A | Discharge status: Home / Self Care | Payer: Medicare Other | Source: Ambulatory Visit | Attending: Internal Medicine | Admitting: Internal Medicine

## 2021-12-10 ENCOUNTER — Other Ambulatory Visit: Payer: Self-pay | Admitting: Internal Medicine

## 2021-12-10 DIAGNOSIS — M1990 Unspecified osteoarthritis, unspecified site: Secondary | ICD-10-CM | POA: Diagnosis not present

## 2021-12-10 DIAGNOSIS — W19XXXA Unspecified fall, initial encounter: Secondary | ICD-10-CM | POA: Diagnosis not present

## 2021-12-10 DIAGNOSIS — Z85038 Personal history of other malignant neoplasm of large intestine: Secondary | ICD-10-CM | POA: Diagnosis not present

## 2021-12-10 DIAGNOSIS — F32A Depression, unspecified: Secondary | ICD-10-CM | POA: Diagnosis not present

## 2021-12-10 DIAGNOSIS — E8779 Other fluid overload: Secondary | ICD-10-CM | POA: Diagnosis not present

## 2021-12-10 DIAGNOSIS — J449 Chronic obstructive pulmonary disease, unspecified: Secondary | ICD-10-CM | POA: Diagnosis not present

## 2021-12-10 DIAGNOSIS — M25561 Pain in right knee: Secondary | ICD-10-CM

## 2021-12-10 DIAGNOSIS — E041 Nontoxic single thyroid nodule: Secondary | ICD-10-CM | POA: Diagnosis not present

## 2021-12-10 DIAGNOSIS — R519 Headache, unspecified: Secondary | ICD-10-CM | POA: Diagnosis not present

## 2021-12-10 DIAGNOSIS — Z9181 History of falling: Secondary | ICD-10-CM | POA: Diagnosis not present

## 2021-12-10 DIAGNOSIS — I10 Essential (primary) hypertension: Secondary | ICD-10-CM | POA: Diagnosis not present

## 2021-12-10 DIAGNOSIS — Z853 Personal history of malignant neoplasm of breast: Secondary | ICD-10-CM | POA: Diagnosis not present

## 2021-12-10 DIAGNOSIS — Z87891 Personal history of nicotine dependence: Secondary | ICD-10-CM | POA: Diagnosis not present

## 2021-12-10 DIAGNOSIS — N189 Chronic kidney disease, unspecified: Secondary | ICD-10-CM | POA: Diagnosis not present

## 2021-12-10 DIAGNOSIS — E1122 Type 2 diabetes mellitus with diabetic chronic kidney disease: Secondary | ICD-10-CM | POA: Diagnosis not present

## 2021-12-10 DIAGNOSIS — E7800 Pure hypercholesterolemia, unspecified: Secondary | ICD-10-CM | POA: Diagnosis not present

## 2021-12-10 DIAGNOSIS — R6884 Jaw pain: Secondary | ICD-10-CM | POA: Diagnosis not present

## 2021-12-10 DIAGNOSIS — Z905 Acquired absence of kidney: Secondary | ICD-10-CM | POA: Diagnosis not present

## 2021-12-10 DIAGNOSIS — E785 Hyperlipidemia, unspecified: Secondary | ICD-10-CM | POA: Diagnosis not present

## 2021-12-10 DIAGNOSIS — Z7984 Long term (current) use of oral hypoglycemic drugs: Secondary | ICD-10-CM | POA: Diagnosis not present

## 2021-12-10 DIAGNOSIS — K219 Gastro-esophageal reflux disease without esophagitis: Secondary | ICD-10-CM | POA: Diagnosis not present

## 2021-12-10 DIAGNOSIS — D631 Anemia in chronic kidney disease: Secondary | ICD-10-CM | POA: Diagnosis not present

## 2021-12-10 DIAGNOSIS — I129 Hypertensive chronic kidney disease with stage 1 through stage 4 chronic kidney disease, or unspecified chronic kidney disease: Secondary | ICD-10-CM | POA: Diagnosis not present

## 2021-12-10 DIAGNOSIS — J441 Chronic obstructive pulmonary disease with (acute) exacerbation: Secondary | ICD-10-CM | POA: Diagnosis not present

## 2021-12-10 DIAGNOSIS — Z85118 Personal history of other malignant neoplasm of bronchus and lung: Secondary | ICD-10-CM | POA: Diagnosis not present

## 2021-12-10 DIAGNOSIS — Z7951 Long term (current) use of inhaled steroids: Secondary | ICD-10-CM | POA: Diagnosis not present

## 2021-12-10 DIAGNOSIS — E1142 Type 2 diabetes mellitus with diabetic polyneuropathy: Secondary | ICD-10-CM | POA: Diagnosis not present

## 2021-12-10 DIAGNOSIS — Z9981 Dependence on supplemental oxygen: Secondary | ICD-10-CM | POA: Diagnosis not present

## 2021-12-11 DIAGNOSIS — J439 Emphysema, unspecified: Secondary | ICD-10-CM | POA: Diagnosis not present

## 2021-12-16 DIAGNOSIS — Z7951 Long term (current) use of inhaled steroids: Secondary | ICD-10-CM | POA: Diagnosis not present

## 2021-12-16 DIAGNOSIS — E1122 Type 2 diabetes mellitus with diabetic chronic kidney disease: Secondary | ICD-10-CM | POA: Diagnosis not present

## 2021-12-16 DIAGNOSIS — Z7984 Long term (current) use of oral hypoglycemic drugs: Secondary | ICD-10-CM | POA: Diagnosis not present

## 2021-12-16 DIAGNOSIS — F32A Depression, unspecified: Secondary | ICD-10-CM | POA: Diagnosis not present

## 2021-12-16 DIAGNOSIS — Z9981 Dependence on supplemental oxygen: Secondary | ICD-10-CM | POA: Diagnosis not present

## 2021-12-16 DIAGNOSIS — Z9181 History of falling: Secondary | ICD-10-CM | POA: Diagnosis not present

## 2021-12-16 DIAGNOSIS — K219 Gastro-esophageal reflux disease without esophagitis: Secondary | ICD-10-CM | POA: Diagnosis not present

## 2021-12-16 DIAGNOSIS — J449 Chronic obstructive pulmonary disease, unspecified: Secondary | ICD-10-CM | POA: Diagnosis not present

## 2021-12-16 DIAGNOSIS — Z87891 Personal history of nicotine dependence: Secondary | ICD-10-CM | POA: Diagnosis not present

## 2021-12-16 DIAGNOSIS — Z853 Personal history of malignant neoplasm of breast: Secondary | ICD-10-CM | POA: Diagnosis not present

## 2021-12-16 DIAGNOSIS — D631 Anemia in chronic kidney disease: Secondary | ICD-10-CM | POA: Diagnosis not present

## 2021-12-16 DIAGNOSIS — N189 Chronic kidney disease, unspecified: Secondary | ICD-10-CM | POA: Diagnosis not present

## 2021-12-16 DIAGNOSIS — Z905 Acquired absence of kidney: Secondary | ICD-10-CM | POA: Diagnosis not present

## 2021-12-16 DIAGNOSIS — E041 Nontoxic single thyroid nodule: Secondary | ICD-10-CM | POA: Diagnosis not present

## 2021-12-16 DIAGNOSIS — Z85118 Personal history of other malignant neoplasm of bronchus and lung: Secondary | ICD-10-CM | POA: Diagnosis not present

## 2021-12-16 DIAGNOSIS — E1142 Type 2 diabetes mellitus with diabetic polyneuropathy: Secondary | ICD-10-CM | POA: Diagnosis not present

## 2021-12-16 DIAGNOSIS — E785 Hyperlipidemia, unspecified: Secondary | ICD-10-CM | POA: Diagnosis not present

## 2021-12-16 DIAGNOSIS — I129 Hypertensive chronic kidney disease with stage 1 through stage 4 chronic kidney disease, or unspecified chronic kidney disease: Secondary | ICD-10-CM | POA: Diagnosis not present

## 2021-12-16 DIAGNOSIS — Z85038 Personal history of other malignant neoplasm of large intestine: Secondary | ICD-10-CM | POA: Diagnosis not present

## 2021-12-17 DIAGNOSIS — E041 Nontoxic single thyroid nodule: Secondary | ICD-10-CM | POA: Diagnosis not present

## 2021-12-17 DIAGNOSIS — E8779 Other fluid overload: Secondary | ICD-10-CM | POA: Diagnosis not present

## 2021-12-20 DIAGNOSIS — F32A Depression, unspecified: Secondary | ICD-10-CM | POA: Diagnosis not present

## 2021-12-20 DIAGNOSIS — D631 Anemia in chronic kidney disease: Secondary | ICD-10-CM | POA: Diagnosis not present

## 2021-12-20 DIAGNOSIS — Z9981 Dependence on supplemental oxygen: Secondary | ICD-10-CM | POA: Diagnosis not present

## 2021-12-20 DIAGNOSIS — I129 Hypertensive chronic kidney disease with stage 1 through stage 4 chronic kidney disease, or unspecified chronic kidney disease: Secondary | ICD-10-CM | POA: Diagnosis not present

## 2021-12-20 DIAGNOSIS — Z85038 Personal history of other malignant neoplasm of large intestine: Secondary | ICD-10-CM | POA: Diagnosis not present

## 2021-12-20 DIAGNOSIS — E1122 Type 2 diabetes mellitus with diabetic chronic kidney disease: Secondary | ICD-10-CM | POA: Diagnosis not present

## 2021-12-20 DIAGNOSIS — J449 Chronic obstructive pulmonary disease, unspecified: Secondary | ICD-10-CM | POA: Diagnosis not present

## 2021-12-20 DIAGNOSIS — K219 Gastro-esophageal reflux disease without esophagitis: Secondary | ICD-10-CM | POA: Diagnosis not present

## 2021-12-20 DIAGNOSIS — E041 Nontoxic single thyroid nodule: Secondary | ICD-10-CM | POA: Diagnosis not present

## 2021-12-20 DIAGNOSIS — Z85118 Personal history of other malignant neoplasm of bronchus and lung: Secondary | ICD-10-CM | POA: Diagnosis not present

## 2021-12-20 DIAGNOSIS — N189 Chronic kidney disease, unspecified: Secondary | ICD-10-CM | POA: Diagnosis not present

## 2021-12-20 DIAGNOSIS — Z853 Personal history of malignant neoplasm of breast: Secondary | ICD-10-CM | POA: Diagnosis not present

## 2021-12-20 DIAGNOSIS — Z87891 Personal history of nicotine dependence: Secondary | ICD-10-CM | POA: Diagnosis not present

## 2021-12-20 DIAGNOSIS — Z7951 Long term (current) use of inhaled steroids: Secondary | ICD-10-CM | POA: Diagnosis not present

## 2021-12-20 DIAGNOSIS — E785 Hyperlipidemia, unspecified: Secondary | ICD-10-CM | POA: Diagnosis not present

## 2021-12-20 DIAGNOSIS — Z7984 Long term (current) use of oral hypoglycemic drugs: Secondary | ICD-10-CM | POA: Diagnosis not present

## 2021-12-20 DIAGNOSIS — Z905 Acquired absence of kidney: Secondary | ICD-10-CM | POA: Diagnosis not present

## 2021-12-20 DIAGNOSIS — E1142 Type 2 diabetes mellitus with diabetic polyneuropathy: Secondary | ICD-10-CM | POA: Diagnosis not present

## 2021-12-20 DIAGNOSIS — Z9181 History of falling: Secondary | ICD-10-CM | POA: Diagnosis not present

## 2021-12-23 DIAGNOSIS — M25561 Pain in right knee: Secondary | ICD-10-CM | POA: Diagnosis not present

## 2021-12-23 DIAGNOSIS — M25562 Pain in left knee: Secondary | ICD-10-CM | POA: Diagnosis not present

## 2021-12-23 DIAGNOSIS — C3491 Malignant neoplasm of unspecified part of right bronchus or lung: Secondary | ICD-10-CM | POA: Diagnosis not present

## 2021-12-23 DIAGNOSIS — J439 Emphysema, unspecified: Secondary | ICD-10-CM | POA: Diagnosis not present

## 2021-12-30 DIAGNOSIS — F32A Depression, unspecified: Secondary | ICD-10-CM | POA: Diagnosis not present

## 2021-12-30 DIAGNOSIS — Z7951 Long term (current) use of inhaled steroids: Secondary | ICD-10-CM | POA: Diagnosis not present

## 2021-12-30 DIAGNOSIS — Z853 Personal history of malignant neoplasm of breast: Secondary | ICD-10-CM | POA: Diagnosis not present

## 2021-12-30 DIAGNOSIS — E785 Hyperlipidemia, unspecified: Secondary | ICD-10-CM | POA: Diagnosis not present

## 2021-12-30 DIAGNOSIS — E041 Nontoxic single thyroid nodule: Secondary | ICD-10-CM | POA: Diagnosis not present

## 2021-12-30 DIAGNOSIS — Z85038 Personal history of other malignant neoplasm of large intestine: Secondary | ICD-10-CM | POA: Diagnosis not present

## 2021-12-30 DIAGNOSIS — Z905 Acquired absence of kidney: Secondary | ICD-10-CM | POA: Diagnosis not present

## 2021-12-30 DIAGNOSIS — E1142 Type 2 diabetes mellitus with diabetic polyneuropathy: Secondary | ICD-10-CM | POA: Diagnosis not present

## 2021-12-30 DIAGNOSIS — Z9981 Dependence on supplemental oxygen: Secondary | ICD-10-CM | POA: Diagnosis not present

## 2021-12-30 DIAGNOSIS — K219 Gastro-esophageal reflux disease without esophagitis: Secondary | ICD-10-CM | POA: Diagnosis not present

## 2021-12-30 DIAGNOSIS — D631 Anemia in chronic kidney disease: Secondary | ICD-10-CM | POA: Diagnosis not present

## 2021-12-30 DIAGNOSIS — Z87891 Personal history of nicotine dependence: Secondary | ICD-10-CM | POA: Diagnosis not present

## 2021-12-30 DIAGNOSIS — Z85118 Personal history of other malignant neoplasm of bronchus and lung: Secondary | ICD-10-CM | POA: Diagnosis not present

## 2021-12-30 DIAGNOSIS — Z9181 History of falling: Secondary | ICD-10-CM | POA: Diagnosis not present

## 2021-12-30 DIAGNOSIS — Z7984 Long term (current) use of oral hypoglycemic drugs: Secondary | ICD-10-CM | POA: Diagnosis not present

## 2021-12-30 DIAGNOSIS — E1122 Type 2 diabetes mellitus with diabetic chronic kidney disease: Secondary | ICD-10-CM | POA: Diagnosis not present

## 2021-12-30 DIAGNOSIS — I129 Hypertensive chronic kidney disease with stage 1 through stage 4 chronic kidney disease, or unspecified chronic kidney disease: Secondary | ICD-10-CM | POA: Diagnosis not present

## 2021-12-30 DIAGNOSIS — N189 Chronic kidney disease, unspecified: Secondary | ICD-10-CM | POA: Diagnosis not present

## 2021-12-30 DIAGNOSIS — J449 Chronic obstructive pulmonary disease, unspecified: Secondary | ICD-10-CM | POA: Diagnosis not present

## 2022-01-01 DIAGNOSIS — J439 Emphysema, unspecified: Secondary | ICD-10-CM | POA: Diagnosis not present

## 2022-01-05 DIAGNOSIS — J449 Chronic obstructive pulmonary disease, unspecified: Secondary | ICD-10-CM | POA: Diagnosis not present

## 2022-01-05 DIAGNOSIS — I10 Essential (primary) hypertension: Secondary | ICD-10-CM | POA: Diagnosis not present

## 2022-01-05 DIAGNOSIS — E7800 Pure hypercholesterolemia, unspecified: Secondary | ICD-10-CM | POA: Diagnosis not present

## 2022-01-05 DIAGNOSIS — E1169 Type 2 diabetes mellitus with other specified complication: Secondary | ICD-10-CM | POA: Diagnosis not present

## 2022-01-11 DIAGNOSIS — J439 Emphysema, unspecified: Secondary | ICD-10-CM | POA: Diagnosis not present

## 2022-01-31 DIAGNOSIS — J439 Emphysema, unspecified: Secondary | ICD-10-CM | POA: Diagnosis not present

## 2022-02-04 DIAGNOSIS — E1169 Type 2 diabetes mellitus with other specified complication: Secondary | ICD-10-CM | POA: Diagnosis not present

## 2022-02-04 DIAGNOSIS — I10 Essential (primary) hypertension: Secondary | ICD-10-CM | POA: Diagnosis not present

## 2022-02-04 DIAGNOSIS — E7800 Pure hypercholesterolemia, unspecified: Secondary | ICD-10-CM | POA: Diagnosis not present

## 2022-02-04 DIAGNOSIS — J441 Chronic obstructive pulmonary disease with (acute) exacerbation: Secondary | ICD-10-CM | POA: Diagnosis not present

## 2022-02-10 DIAGNOSIS — J439 Emphysema, unspecified: Secondary | ICD-10-CM | POA: Diagnosis not present

## 2022-02-23 ENCOUNTER — Other Ambulatory Visit: Payer: Self-pay | Admitting: *Deleted

## 2022-02-23 NOTE — Patient Outreach (Signed)
  Care Coordination   02/23/2022 Name: Amy [name] MRN: [id number] DOB: 02-Apr-1946   Care Coordination Outreach Attempts:  An unsuccessful telephone outreach was attempted today to offer the patient information about available care coordination services as a benefit of their health plan.   Follow Up Plan:  Additional outreach attempts will be made to offer the patient care coordination information and services.   Encounter Outcome:  No Answer  Care Coordination Interventions Activated:  No   Care Coordination Interventions:  No, not indicated    Emelia Loron RN, BSN [place] [phone] [email or web address]

## 2022-03-03 DIAGNOSIS — J439 Emphysema, unspecified: Secondary | ICD-10-CM | POA: Diagnosis not present

## 2022-03-13 DIAGNOSIS — J439 Emphysema, unspecified: Secondary | ICD-10-CM | POA: Diagnosis not present

## 2022-04-03 DIAGNOSIS — J439 Emphysema, unspecified: Secondary | ICD-10-CM | POA: Diagnosis not present

## 2022-04-13 DIAGNOSIS — J439 Emphysema, unspecified: Secondary | ICD-10-CM | POA: Diagnosis not present

## 2022-05-03 DIAGNOSIS — I10 Essential (primary) hypertension: Secondary | ICD-10-CM | POA: Diagnosis not present

## 2022-05-03 DIAGNOSIS — J439 Emphysema, unspecified: Secondary | ICD-10-CM | POA: Diagnosis not present

## 2022-05-03 DIAGNOSIS — E1169 Type 2 diabetes mellitus with other specified complication: Secondary | ICD-10-CM | POA: Diagnosis not present

## 2022-05-03 DIAGNOSIS — J449 Chronic obstructive pulmonary disease, unspecified: Secondary | ICD-10-CM | POA: Diagnosis not present

## 2022-05-03 DIAGNOSIS — E7800 Pure hypercholesterolemia, unspecified: Secondary | ICD-10-CM | POA: Diagnosis not present

## 2022-05-06 ENCOUNTER — Telehealth: Payer: Self-pay

## 2022-05-06 NOTE — Patient Outreach (Signed)
  Care Coordination   05/06/2022 Name: Amy [name] MRN: [id number] DOB: 24-Feb-1946   Care Coordination Outreach Attempts:  An unsuccessful telephone outreach was attempted today to offer the patient information about available care coordination services as a benefit of their health plan.   Follow Up Plan:  Additional outreach attempts will be made to offer the patient care coordination information and services.   Encounter Outcome:  No Answer  Care Coordination Interventions Activated:  No   Care Coordination Interventions:  No, not indicated    [place] [phone]

## 2022-05-13 DIAGNOSIS — J439 Emphysema, unspecified: Secondary | ICD-10-CM | POA: Diagnosis not present

## 2022-05-31 ENCOUNTER — Ambulatory Visit: Payer: Medicare Other | Admitting: Primary Care

## 2022-06-03 DIAGNOSIS — J439 Emphysema, unspecified: Secondary | ICD-10-CM | POA: Diagnosis not present

## 2022-06-08 ENCOUNTER — Ambulatory Visit: Payer: Medicare Other | Admitting: Primary Care

## 2022-06-08 NOTE — Progress Notes (Deleted)
@Patient  ID: Amy [name], female    DOB: July 28, 1945, 76 y.o.   MRN: [id number]  No chief complaint on file.   Referring provider: Seward Carol, MD  HPI: 76 year old female, former smoker quit in 2015 (26-pack-year history).  Past medical history significant for COPD/emphysema, non-small cell lung cancer, chronic respiratory failure with hypoxia, pulmonary hypertension, GERD, colon cancer, renal cell cancer, type 2 diabetes.  Patient of Dr. Chase Caller, last seen in February 2021.  High-resolution CT chest June 2018 without interstitial lung disease and micro nodules are stable not requiring for the follow-up. Maintained on Symbicort, spiriva, prn albuterol and chronic oxygen.    06/08/2022 - Interim hx  Patient presents today for overdue follow-up chronic respiratory failure and COPD/emphysema.  Maintained on Symbicort, Spiriva and supplemental oxygen    CAT score-    Imaging: HRCT 11/03/21 >> mild bibasilar scarring or subsegmental atelectasis; 41mm peri fissural nodules left upper lobe   Allergies  Allergen Reactions   Crab [Shellfish Allergy] Anaphylaxis   Iodine Anaphylaxis   Albuterol-Ipratropium [Ipratropium-Albuterol]     Tremors, blurred vision.    Incruse Ellipta [Umeclidinium Bromide] Other (See Comments)    Trembling, blurred vision   Iohexol      Code: HIVES, Desc: pt states , swelling mouth , throat, and has difficulty breathing    Lactose Intolerance (Gi)    Adhesive [Tape] Rash   Latex Itching and Rash    Immunization History  Administered Date(s) Administered   Influenza, High Dose Seasonal PF 04/18/2016, 04/09/2018   Influenza,inj,Quad PF,6+ Mos 08/26/2015   Influenza-Unspecified 04/26/2017   Pneumococcal Conjugate-13 08/26/2015    Past Medical History:  Diagnosis Date   Allergy    Anemia    Anxiety    Arthritis    Asthma exacerbation 11/03/2021   Blood dyscrasia    Sickle Cell traits   Blood transfusion without reported diagnosis 1999    prbc S/P COLON SURGERY   Breast cancer, right breast ([place]) 10/03/12   Cancer ([place])    colon,renal,lung   Cataract    NO SURGERY   Chronic kidney disease 1999   RIGHT KIDNEY REMOVED   Colon cancer ([place]) 1999, 2013   Depression    Diabetes mellitus    diet controlled   GERD (gastroesophageal reflux disease)    H/O: lung cancer    Heart murmur    Hernia    Near umbilicus   History of colon cancer    History of kidney cancer    Hot flashes    5 yr pill for breast CA causing hot flashes   HX: breast cancer 2014   --right breast   Hypertension    Does not see a cardiologist   Internal hemorrhoids    Interstitial lung disease ([place]) 11/25/2015   Lung cancer ([place]) 2001   Neuromuscular disorder ([place])    PERIPHERAL NEUROPATHY FEET   Shortness of breath    with exertion    Sickle cell trait (HCC)    Tubular adenoma of colon    Ulcer    HX YEARS AGO    Tobacco History: Social History   Tobacco Use  Smoking Status Former   Packs/day: 1.25   Years: 21.00   Total pack years: 26.25   Types: Cigarettes   Start date: 10/30/1992   Quit date: 11/20/2013   Years since quitting: 8.5  Smokeless Tobacco Never  Tobacco Comments   quit then started up again 2years ago--smokes 1pack every 2-3 days   Counseling given: Not  Answered Tobacco comments: quit then started up again 2years ago--smokes 1pack every 2-3 days   Outpatient Medications Prior to Visit  Medication Sig Dispense Refill   acetaminophen (TYLENOL) 500 MG tablet Take 1,000 mg by mouth every 6 (six) hours as needed for moderate pain.     Albuterol Sulfate (PROAIR RESPICLICK) 756 (90 Base) MCG/ACT AEPB Inhale 2 puffs into the lungs every 8 (eight) hours as needed (wheezing/shortness of breath). 1 each 1   ALPRAZolam (XANAX) 1 MG tablet Take 0.5 mg by mouth daily as needed for anxiety.     Ascorbic Acid (VITAMIN C WITH ROSE HIPS) 500 MG tablet Take 500 mg by mouth daily.     atorvastatin (LIPITOR) 20 MG tablet Take 20 mg by  mouth daily.      budesonide-formoterol (SYMBICORT) 160-4.5 MCG/ACT inhaler Inhale 2 puffs into the lungs 2 (two) times daily. 1 Inhaler 5   Calcium Carbonate-Vitamin D (CALCIUM-VITAMIN D) 500-200 MG-UNIT per tablet Take 1 tablet by mouth every Monday, Wednesday, and Friday.      Ensure Max Protein (ENSURE MAX PROTEIN) LIQD Take 330 mLs (11 oz total) by mouth daily. 330 mL 0   Ferrous Gluconate (IRON) 240 (27 FE) MG TABS Take 1 tablet by mouth every morning.     guaiFENesin (MUCINEX) 600 MG 12 hr tablet Take 1 tablet (600 mg total) by mouth 2 (two) times daily. 20 tablet 0   hydrochlorothiazide 25 MG tablet Take 25 mg by mouth daily before breakfast.      losartan (COZAAR) 50 MG tablet Take 50 mg by mouth daily.     metFORMIN (GLUCOPHAGE) 500 MG tablet Take 500 mg by mouth daily with breakfast.      metoprolol (LOPRESSOR) 100 MG tablet Take 100 mg by mouth daily before breakfast.      Multiple Vitamin (MULTIVITAMIN WITH MINERALS) TABS tablet Take 1 tablet by mouth daily. 30 tablet 0   omeprazole (PRILOSEC) 20 MG capsule Take 20 mg by mouth daily before breakfast.     OXYGEN Inhale 2 L into the lungs continuous.     PARoxetine (PAXIL) 40 MG tablet Take 40 mg by mouth daily before breakfast.      Tiotropium Bromide Monohydrate (SPIRIVA RESPIMAT) 1.25 MCG/ACT AERS Inhale 2 puffs into the lungs daily. (Patient taking differently: Inhale 2 puffs into the lungs at bedtime.) 4 g 0   traMADol (ULTRAM) 50 MG tablet Take 50 mg by mouth every 12 (twelve) hours as needed for severe pain.     No facility-administered medications prior to visit.      Review of Systems  Review of Systems   Physical Exam  LMP 07/26/1983  Physical Exam   Lab Results:  CBC    Component Value Date/Time   WBC 7.4 11/06/2021 0417   RBC 3.94 11/06/2021 0417   HGB 11.3 (L) 11/06/2021 0417   HGB 12.8 03/01/2017 1337   HCT 35.4 (L) 11/06/2021 0417   HCT 38.7 03/01/2017 1337   PLT 124 (L) 11/06/2021 0417   PLT 143  (L) 03/01/2017 1337   MCV 89.8 11/06/2021 0417   MCV 87.8 03/01/2017 1337   MCH 28.7 11/06/2021 0417   MCHC 31.9 11/06/2021 0417   RDW 15.2 11/06/2021 0417   RDW 14.7 (H) 03/01/2017 1337   LYMPHSABS 1.3 11/03/2021 1040   LYMPHSABS 1.5 03/01/2017 1337   MONOABS 0.3 11/03/2021 1040   MONOABS 0.3 03/01/2017 1337   EOSABS 0.1 11/03/2021 1040   EOSABS 0.1 03/01/2017 1337   BASOSABS  0.0 11/03/2021 1040   BASOSABS 0.0 03/01/2017 1337    BMET    Component Value Date/Time   NA 141 11/07/2021 0400   NA 141 03/01/2017 1337   K 3.7 11/07/2021 0400   K 3.6 03/01/2017 1337   CL 96 (L) 11/07/2021 0400   CL 100 12/27/2012 1418   CO2 38 (H) 11/07/2021 0400   CO2 35 (H) 03/01/2017 1337   GLUCOSE 133 (H) 11/07/2021 0400   GLUCOSE 157 (H) 03/01/2017 1337   GLUCOSE 107 (H) 12/27/2012 1418   BUN 30 (H) 11/07/2021 0400   BUN 21.3 03/01/2017 1337   CREATININE 0.70 11/07/2021 0400   CREATININE 1.1 03/01/2017 1337   CALCIUM 9.6 11/07/2021 0400   CALCIUM 10.5 (H) 03/01/2017 1337   GFRNONAA >60 11/07/2021 0400   GFRAA >60 12/09/2016 0649    BNP    Component Value Date/Time   BNP 112.2 (H) 11/03/2021 1040    ProBNP No results found for: "PROBNP"  Imaging: No results found.   Assessment & Plan:   No problem-specific Assessment & Plan notes found for this encounter.     Martyn Ehrich, NP 06/08/2022

## 2022-06-13 DIAGNOSIS — J439 Emphysema, unspecified: Secondary | ICD-10-CM | POA: Diagnosis not present

## 2022-06-29 ENCOUNTER — Encounter: Payer: Self-pay | Admitting: Gastroenterology

## 2022-07-03 DIAGNOSIS — J439 Emphysema, unspecified: Secondary | ICD-10-CM | POA: Diagnosis not present

## 2022-07-13 DIAGNOSIS — J439 Emphysema, unspecified: Secondary | ICD-10-CM | POA: Diagnosis not present

## 2022-08-03 DIAGNOSIS — J439 Emphysema, unspecified: Secondary | ICD-10-CM | POA: Diagnosis not present

## 2022-08-10 ENCOUNTER — Ambulatory Visit: Payer: Medicare Other | Admitting: Gastroenterology

## 2022-08-13 DIAGNOSIS — J439 Emphysema, unspecified: Secondary | ICD-10-CM | POA: Diagnosis not present

## 2022-09-03 DIAGNOSIS — J439 Emphysema, unspecified: Secondary | ICD-10-CM | POA: Diagnosis not present

## 2022-09-13 DIAGNOSIS — J439 Emphysema, unspecified: Secondary | ICD-10-CM | POA: Diagnosis not present

## 2022-09-20 ENCOUNTER — Ambulatory Visit: Payer: Medicare Other | Admitting: Gastroenterology

## 2022-10-02 DIAGNOSIS — J439 Emphysema, unspecified: Secondary | ICD-10-CM | POA: Diagnosis not present

## 2022-10-12 DIAGNOSIS — J439 Emphysema, unspecified: Secondary | ICD-10-CM | POA: Diagnosis not present

## 2022-11-02 DIAGNOSIS — J439 Emphysema, unspecified: Secondary | ICD-10-CM | POA: Diagnosis not present

## 2022-11-07 ENCOUNTER — Telehealth: Payer: Self-pay | Admitting: Internal Medicine

## 2022-11-07 NOTE — Telephone Encounter (Signed)
PT seen more than 3 years ago. States she is SOB. Would like to see Dr. Maple Hudson if possible since now she is technically a New Pt. Consult. He took care of her mother, Ms. Juanita Craver, and they are enamored with him.  I adv her politely that he is only taking Sleep PT's as "New Patients". She is asking him for an exception. Pls call @ [phone]

## 2022-11-07 NOTE — Telephone Encounter (Signed)
Spoke with patient. Dr. Maple Hudson has okay'd to see her and I have got her scheduled with him. nfn

## 2022-11-12 DIAGNOSIS — J439 Emphysema, unspecified: Secondary | ICD-10-CM | POA: Diagnosis not present

## 2022-11-17 ENCOUNTER — Institutional Professional Consult (permissible substitution): Payer: Medicare Other | Admitting: Student

## 2022-12-02 DIAGNOSIS — J439 Emphysema, unspecified: Secondary | ICD-10-CM | POA: Diagnosis not present

## 2022-12-12 DIAGNOSIS — J439 Emphysema, unspecified: Secondary | ICD-10-CM | POA: Diagnosis not present

## 2022-12-15 NOTE — Progress Notes (Signed)
IOV 10/27/2015- Dr Marchelle Gearing-  Chief Complaint  Patient presents with   Pulmonary Consult    Pt self referral for DOE x 1 year. Pt c/o non prod cough with chest congestion x 1 week.     77 year-old morbidly obese female, previous history of 26 pack smoking quit 2 years ago. Also multiple cancer history and cancer survivor was reportedly in complete remission. Last CT scan of the chest in 2013 without any evidence of cancer in the lung. Followed by primary care physician. She reports insidious onset of shortness of breath for the last 1 year. Progressive only somewhat. Overall stable. Moderate in intensity. Ranging clothes walking one flight of stairs or doing groceries brings on her dyspnea. It is relieved by rest. For the last 2 weeks it is associated with cough which is dry but she does not feel sick. There is no fever or hemoptysis or orthopnea paroxysmal nocturnal dyspnea. She is morbidly obese with a BMI greater than 40 but it is unchanged. There is a history of mild sleep apnea on a sleep study per her history 10 years ago.  Walking desaturation test 180 5. 3 laps on room air with a full head probe: She walk only 30 feet and she desaturated to 85%. We walked her final feet and she desaturated to 81%. She improved a pulse ox greater than 92% at rest. She declined any November the oxygen therapy. Ct chest oct 2013 -  IMPRESSION: 1.  Surgical changes on the right.  No evidence of recurrent or metastatic disease. 2.  Mild centrilobular emphysema. 3. Pulmonary artery enlargement suggests pulmonary arterial hypertension. 4. Esophageal air fluid level suggests dysmotility or gastroesophageal reflux.   12/16/22- 75 yoF former smoker transferring to my care for for COPD ( I had cared for her mother, Mrs Doyce Para). Lives aloone. Medical hx of Pulmonary HTN, HTN, hx NSCCA Lung, ILD, Chronic Respiratory Failure with Hypoxia, hx Colon Cancer, GERD, DM2, hx Renal Cell Ca, Anxiety/Depression, hx Breast  Ca,  - Symbicort 160, Spiriva 1.25,  -----Patient complains of SOB with exertion. Has gotten worse over the last few weeks  O2 POC 2L 85% on arival, rose with rest to 90% No oncology follow-up since the COVID pandemic.  No history of DVT.  Denies smoking in 10 years. Her portable oxygen concentrator (Inogen) cuts off-will not go any higher than 4 L but runs okay on 2 L pulse. PFT 11/18/15- moderately severe obstruction, significant response to BD, severe Diffusion defect CT chest 11/03/21- IMPRESSION: 1.6 cm left thyroid nodule. Recommend thyroid US. (Ref: J Am Coll Radiol. 2015 Feb;12(2): 143-50). Mild bibasilar scarring or subsegmental atelectasis is noted. 5 mm Peri fissural nodule seen in left upper lobe. No follow-up needed if patient is low-risk.This recommendation follows the consensus statement: Guidelines for Management of Incidental Pulmonary Nodules Detected on CT Images: From the Fleischner Society 2017; Radiology 2017; 284:228-243. Enlarged pulmonary artery trunk is noted suggesting pulmonary hypertension. Aortic Atherosclerosis (ICD10-I70.0).   Prior to Admission medications   Medication Sig Start Date End Date Taking? Authorizing Provider  acetaminophen (TYLENOL) 500 MG tablet Take 1,000 mg by mouth every 6 (six) hours as needed for moderate pain.   Yes [provider]  Albuterol Sulfate (PROAIR RESPICLICK) 108 (90 Base) MCG/ACT AEPB Inhale 2 puffs into the lungs every 8 (eight) hours as needed (wheezing/shortness of breath). 08/01/19  Yes Kalman Shan, MD  Ascorbic Acid (VITAMIN C WITH ROSE HIPS) 500 MG tablet Take 500 mg by mouth daily.  Yes [provider]  atorvastatin (LIPITOR) 20 MG tablet Take 20 mg by mouth daily.  03/08/12  Yes [provider]  Calcium Carbonate-Vitamin D (CALCIUM-VITAMIN D) 500-200 MG-UNIT per tablet Take 1 tablet by mouth every Monday, Wednesday, and Friday.    Yes [provider]  Ensure Max Protein (ENSURE  MAX PROTEIN) LIQD Take 330 mLs (11 oz total) by mouth daily. 11/08/21  Yes Swayze, Ava, DO  Ferrous Gluconate (IRON) 240 (27 FE) MG TABS Take 1 tablet by mouth every morning.   Yes [provider]  guaiFENesin (MUCINEX) 600 MG 12 hr tablet Take 1 tablet (600 mg total) by mouth 2 (two) times daily. 11/07/21  Yes Swayze, Ava, DO  hydrochlorothiazide 25 MG tablet Take 25 mg by mouth daily before breakfast.  01/03/11  Yes [provider]  losartan (COZAAR) 50 MG tablet Take 50 mg by mouth daily.   Yes [provider]  metFORMIN (GLUCOPHAGE) 500 MG tablet Take 500 mg by mouth daily with breakfast.  12/26/12  Yes [provider]  metoprolol (LOPRESSOR) 100 MG tablet Take 100 mg by mouth daily before breakfast.  01/03/11  Yes [provider]  Multiple Vitamin (MULTIVITAMIN WITH MINERALS) TABS tablet Take 1 tablet by mouth daily. 11/08/21  Yes Swayze, Ava, DO  omeprazole (PRILOSEC) 20 MG capsule Take 20 mg by mouth daily before breakfast.   Yes [provider]  PARoxetine (PAXIL) 40 MG tablet Take 40 mg by mouth daily before breakfast.  12/22/10  Yes [provider]  traMADol (ULTRAM) 50 MG tablet Take 50 mg by mouth every 12 (twelve) hours as needed for severe pain. 12/27/10  Yes [provider]  Budeson-Glycopyrrol-Formoterol (BREZTRI AEROSPHERE) 160-9-4.8 MCG/ACT AERO Inhale 2 puffs into the lungs in the morning and at bedtime. 12/30/22   Jetty Duhamel D, MD  OXYGEN Inhale 2 L into the lungs continuous.    [provider]   Past Medical History:  Diagnosis Date   Allergy    Anemia    Anxiety    Arthritis    Asthma exacerbation 11/03/2021   Blood dyscrasia    Sickle Cell traits   Blood transfusion without reported diagnosis 1999   prbc S/P COLON SURGERY   Breast cancer, right breast (HCC) 10/03/12   Cancer (HCC)    colon,renal,lung   Cataract    NO SURGERY   Chronic kidney disease 1999   RIGHT KIDNEY REMOVED   Colon cancer  (HCC) 1999, 2013   Depression    Diabetes mellitus    diet controlled   GERD (gastroesophageal reflux disease)    H/O: lung cancer    Heart murmur    Hernia    Near umbilicus   History of colon cancer    History of kidney cancer    Hot flashes    5 yr pill for breast CA causing hot flashes   HX: breast cancer 2014   --right breast   Hypertension    Does not see a cardiologist   Internal hemorrhoids    Interstitial lung disease (HCC) 11/25/2015   Lung cancer (HCC) 2001   Neuromuscular disorder (HCC)    PERIPHERAL NEUROPATHY FEET   Shortness of breath    with exertion    Sickle cell trait (HCC)    Tubular adenoma of colon    Ulcer    HX YEARS AGO   Past Surgical History:  Procedure Laterality Date   ABDOMINAL HYSTERECTOMY  80's   fibroids   BIOPSY  07/02/2018   Procedure: BIOPSY;  Surgeon: Meryl Dare, MD;  Location: [facility];  Service: Endoscopy;;   BREAST BIOPSY     Bilateral cysts/benign   BREAST BIOPSY Right 10/03/12   ER/PR +   BREAST BIOPSY Right 12/20/2012   Procedure: Right breast Excisional Biopsy;  Surgeon: Emelia Loron, MD;  Location: [facility] OR;  Service: General;  Laterality: Right;   BREAST LUMPECTOMY     BREAST LUMPECTOMY WITH NEEDLE LOCALIZATION Right 12/20/2012   Procedure: BREAST LUMPECTOMY WITH NEEDLE LOCALIZATION;  Surgeon: Emelia Loron, MD;  Location: [facility] OR;  Service: General;  Laterality: Right;   BREAST LUMPECTOMY WITH RADIOACTIVE SEED LOCALIZATION Right 01/19/2015   Procedure: RIGHT BREAST LUMPECTOMY WITH RADIOACTIVE SEED LOCALIZATION;  Surgeon: Emelia Loron, MD;  Location: [facility];  Service: General;  Laterality: Right;   CARDIAC CATHETERIZATION N/A 03/02/2016   Procedure: Right Heart Cath;  Surgeon: Dolores Patty, MD;  Location: [facility] INVASIVE CV LAB;  Service: Cardiovascular;  Laterality: N/A;   CATARACT EXTRACTION, BILATERAL  01/2018 &02/2018   COLECTOMY  05-31-2012   laparoscopic ,possible open   COLON SURGERY      chemo   COLONOSCOPY     COLONOSCOPY WITH PROPOFOL N/A 07/02/2018   Procedure: COLONOSCOPY WITH PROPOFOL;  Surgeon: Meryl Dare, MD;  Location: [facility];  Service: Endoscopy;  Laterality: N/A;   KIDNEY SURGERY  1999   right/chemo kidney removed for cancer   LUNG LOBECTOMY     right lower   PARTIAL COLECTOMY  05/31/2012   Procedure: PARTIAL COLECTOMY;  Surgeon: Emelia Loron, MD;  Location: [facility];  Service: General;  Laterality: N/A;   PARTIAL HYSTERECTOMY     POLYPECTOMY     POLYPECTOMY  07/02/2018   Procedure: POLYPECTOMY;  Surgeon: Meryl Dare, MD;  Location: [facility];  Service: Endoscopy;;   RE-EXCISION OF BREAST CANCER,SUPERIOR MARGINS Right 01/21/2013   Procedure: RE-EXCISION OF BREAST CANCER,SUPERIOR MARGINS;  Surgeon: Emelia Loron, MD;  Location: [facility];  Service: General;  Laterality: Right;   TONSILLECTOMY     as child   TUBAL LIGATION     Family History  Problem Relation Age of Onset   Colon polyps Brother    Multiple myeloma Brother 32   Breast cancer Mother        dx in her 30s   Heart disease Father    Colon cancer Paternal Aunt    Breast cancer Maternal Grandmother    Colon cancer Cousin        2 paternal first cousins   Leukemia Cousin 11   Esophageal cancer Neg Hx    Rectal cancer Neg Hx    Stomach cancer Neg Hx    Social History   Socioeconomic History   Marital status: Widowed    Spouse name: Not on file   Number of children: Not on file   Years of education: Not on file   Highest education level: Not on file  Occupational History   Occupation: retired  Tobacco Use   Smoking status: Former    Current packs/day: 0.00    Average packs/day: 1.3 packs/day for 21.1 years (26.3 ttl pk-yrs)    Types: Cigarettes    Start date: 10/30/1992    Quit date: 11/20/2013    Years since quitting: 9.2   Smokeless tobacco: Never   Tobacco comments:    quit then started up again 2years ago--smokes 1pack every 2-3 days  Vaping Use    Vaping status: Never Used  Substance  and Sexual Activity   Alcohol use: No    Alcohol/week: 0.0 standard drinks of alcohol   Drug use: No   Sexual activity: Not Currently    Partners: Male    Birth control/protection: Surgical    Comment: TAH--ovaries retained  Other Topics Concern   Not on file  Social History Narrative   Not on file   Social Determinants of Health   Financial Resource Strain: Not on file  Food Insecurity: Not on file  Transportation Needs: Not on file  Physical Activity: Not on file  Stress: Not on file  Social Connections: Not on file  Intimate Partner Violence: Not on file   ROS-see HPI   Negative unless "+" Constitutional:    weight loss, night sweats, fevers, chills, fatigue, lassitude. HEENT:    headaches, difficulty swallowing, tooth/dental problems, sore throat,       sneezing, itching, ear ache, nasal congestion, post nasal drip, snoring CV:    chest pain, orthopnea, PND, swelling in lower extremities, anasarca,                                  dizziness, palpitations Resp:   shortness of breath with exertion or at rest.                productive cough,   non-productive cough, coughing up of blood.              change in color of mucus.  wheezing.   Skin:    rash or lesions. GI:  No-   heartburn, indigestion, abdominal pain, nausea, vomiting, diarrhea,                 change in bowel habits, loss of appetite GU: dysuria, change in color of urine, no urgency or frequency.   flank pain. MS:   joint pain, stiffness, decreased range of motion, back pain. Neuro-     nothing unusual Psych:  change in mood or affect.  depression or anxiety.   memory loss.  OBJ- Physical Exam General- Alert, Oriented, Affect-appropriate, Distress- none acute, + morbidly obese, +wheelchair Skin- rash-none, lesions- none, excoriation- none Lymphadenopathy- none Head- atraumatic            Eyes- Gross vision intact, PERRLA, conjunctivae and secretions clear             Ears- Hearing, canals-normal            Nose- Clear, no-Septal dev, mucus, polyps, erosion, perforation             Throat- Mallampati II-III , mucosa clear , drainage- none, tonsils- atrophic, + dentures Neck- flexible , trachea midline, no stridor , thyroid nl, carotid no bruit Chest - symmetrical excursion , unlabored           Heart/CV- RRR , no murmur , no gallop  , no rub, nl s1 s2                           - JVD- none , +edema 1-2+, stasis changes- none, varices- none           Lung- clear to P&A, wheeze- none, cough- none , dullness-none, rub- none           Chest wall-  Abd-  Br/ Gen/ Rectal- Not done, not indicated Extrem- cyanosis- none, clubbing, none, atrophy- none, strength- nl Neuro- grossly intact to  observation

## 2022-12-16 ENCOUNTER — Encounter: Payer: Self-pay | Admitting: Internal Medicine

## 2022-12-16 ENCOUNTER — Ambulatory Visit (INDEPENDENT_AMBULATORY_CARE_PROVIDER_SITE_OTHER): Payer: Medicare Other

## 2022-12-16 ENCOUNTER — Ambulatory Visit: Payer: Medicare Other | Admitting: Internal Medicine

## 2022-12-16 VITALS — BP 128/76 | HR 89 | Ht 65.0 in | Wt 224.6 lb

## 2022-12-16 DIAGNOSIS — J441 Chronic obstructive pulmonary disease with (acute) exacerbation: Secondary | ICD-10-CM

## 2022-12-16 DIAGNOSIS — J449 Chronic obstructive pulmonary disease, unspecified: Secondary | ICD-10-CM | POA: Diagnosis not present

## 2022-12-16 DIAGNOSIS — R0609 Other forms of dyspnea: Secondary | ICD-10-CM

## 2022-12-16 DIAGNOSIS — J9611 Chronic respiratory failure with hypoxia: Secondary | ICD-10-CM

## 2022-12-16 LAB — CBC WITH DIFFERENTIAL/PLATELET
Basophils Absolute: 0 10*3/uL (ref 0.0–0.1)
Basophils Relative: 0.3 % (ref 0.0–3.0)
Eosinophils Absolute: 0 10*3/uL (ref 0.0–0.7)
Eosinophils Relative: 0.5 % (ref 0.0–5.0)
HCT: 36.8 % (ref 36.0–46.0)
Hemoglobin: 11.7 g/dL — ABNORMAL LOW (ref 12.0–15.0)
Lymphocytes Relative: 15.7 % (ref 12.0–46.0)
Lymphs Abs: 1.5 10*3/uL (ref 0.7–4.0)
MCHC: 31.8 g/dL (ref 30.0–36.0)
MCV: 90.2 fl (ref 78.0–100.0)
Monocytes Absolute: 0.5 10*3/uL (ref 0.1–1.0)
Monocytes Relative: 5.5 % (ref 3.0–12.0)
Neutro Abs: 7.3 10*3/uL (ref 1.4–7.7)
Neutrophils Relative %: 78 % — ABNORMAL HIGH (ref 43.0–77.0)
Platelets: 145 10*3/uL — ABNORMAL LOW (ref 150.0–400.0)
RBC: 4.08 Mil/uL (ref 3.87–5.11)
RDW: 15.1 % (ref 11.5–15.5)
WBC: 9.3 10*3/uL (ref 4.0–10.5)

## 2022-12-16 LAB — D-DIMER, QUANTITATIVE: D-Dimer, Quant: 0.51 mcg/mL FEU — ABNORMAL HIGH (ref <0.50)

## 2022-12-16 LAB — BRAIN NATRIURETIC PEPTIDE: Pro B Natriuretic peptide (BNP): 86 pg/mL (ref 0.0–100.0)

## 2022-12-16 MED ORDER — BREZTRI AEROSPHERE 160-9-4.8 MCG/ACT IN AERO: 2.0000 | INHALATION_SPRAY | Freq: Two times a day (BID) | RESPIRATORY_TRACT | 0 refills | Status: DC

## 2022-12-16 NOTE — Patient Instructions (Signed)
Order- samples x 2 Breztri inhaler    inhale 2 puffs then rinse mouth, twice daily. Try this instead of th Spiriva and Symbicort to see if you like it better.  Order- CXR   dx COPD mixed type  CBC w diff, CMET, D-dimer, BNP   dx dyspnea on exertion, Chronic Respiratory Failure with Hypoxia

## 2022-12-27 ENCOUNTER — Telehealth: Payer: Self-pay | Admitting: Internal Medicine

## 2022-12-27 NOTE — Telephone Encounter (Signed)
Went over lab results with patient. She verbalized understanding. NFN 

## 2022-12-27 NOTE — Telephone Encounter (Signed)
Patient called back to get  xray results

## 2022-12-27 NOTE — Telephone Encounter (Signed)
Labs are ok. There is borderline anemia in same range as hemoglobin has been over last 2 years. No evidence of fluid overload or significant blood clot.

## 2022-12-27 NOTE — Telephone Encounter (Signed)
Went over cxr with patient. She verbalized understanding.   Dr. Maple Hudson can you please advise on lab work?

## 2022-12-30 ENCOUNTER — Telehealth: Payer: Self-pay | Admitting: Internal Medicine

## 2022-12-30 MED ORDER — BREZTRI AEROSPHERE 160-9-4.8 MCG/ACT IN AERO: 2.0000 | INHALATION_SPRAY | Freq: Two times a day (BID) | RESPIRATORY_TRACT | 5 refills | Status: DC

## 2022-12-30 NOTE — Telephone Encounter (Signed)
PT calling again wondering why not Breztri called in. Explained needed Dr.'s OK ad that's what we are waiting on.   Please call to advise when sent in @ [phone]

## 2022-12-30 NOTE — Telephone Encounter (Signed)
Patient was given Breztri sample at 12/16/2022 appointment, states it is working well and would like script sent to [facility] on [address], Please advise

## 2022-12-30 NOTE — Telephone Encounter (Signed)
I have sent in the [facility] (per office protocol) and notified the patient.  Nothing further needed.

## 2022-12-30 NOTE — Telephone Encounter (Signed)
Amy [name] is working well for patient. Okay to send in prescription?

## 2023-01-02 DIAGNOSIS — J439 Emphysema, unspecified: Secondary | ICD-10-CM | POA: Diagnosis not present

## 2023-01-04 ENCOUNTER — Ambulatory Visit: Payer: Medicare Other | Admitting: Physician Assistant

## 2023-01-12 DIAGNOSIS — K219 Gastro-esophageal reflux disease without esophagitis: Secondary | ICD-10-CM | POA: Diagnosis not present

## 2023-01-12 DIAGNOSIS — J449 Chronic obstructive pulmonary disease, unspecified: Secondary | ICD-10-CM | POA: Diagnosis not present

## 2023-01-12 DIAGNOSIS — I10 Essential (primary) hypertension: Secondary | ICD-10-CM | POA: Diagnosis not present

## 2023-01-12 DIAGNOSIS — J439 Emphysema, unspecified: Secondary | ICD-10-CM | POA: Diagnosis not present

## 2023-01-12 DIAGNOSIS — E7800 Pure hypercholesterolemia, unspecified: Secondary | ICD-10-CM | POA: Diagnosis not present

## 2023-01-16 ENCOUNTER — Telehealth: Payer: Self-pay | Admitting: Internal Medicine

## 2023-01-16 NOTE — Telephone Encounter (Signed)
Pt states she has started on breztri and her vision has been blurry pls advise

## 2023-01-16 NOTE — Telephone Encounter (Signed)
Called and spoke with patient, she states she has had blurry vision since starting [place].  She has had it for a couple of days.  No history of Glaucoma.  No recent eye exam.  She was on 2 inhalers previously, Spiriva and Symbicort.  Advised I would make Dr. Maple Hudson aware and once we hear back from him we will call her back with his recommendations.  Dr. Maple Hudson, Please advise regarding the Breztri inhaler and the blurry vision.  Thank you.

## 2023-01-16 NOTE — Telephone Encounter (Signed)
Called and spoke with patient, advised of results/recommendations per Dr. Maple Hudson. Advised to call back if the blurry vision returns.  Patient needs new eye doctor and will contact [facility] for in network providers.  She verbalized understanding.

## 2023-01-16 NOTE — Telephone Encounter (Signed)
If she can go back to her previous Symbicort, even for a week or so, suggest she do that. Then try Breztri again and see if the problem goes away then comes back.

## 2023-02-01 DIAGNOSIS — J439 Emphysema, unspecified: Secondary | ICD-10-CM | POA: Diagnosis not present

## 2023-02-05 ENCOUNTER — Encounter: Payer: Self-pay | Admitting: Internal Medicine

## 2023-02-05 NOTE — Assessment & Plan Note (Signed)
Depends on O2 Plan- Inogen to service her POC which she says cuts off when set on 4l pulse

## 2023-02-05 NOTE — Assessment & Plan Note (Addendum)
Plan-try using samples of Breztri inhaler instead of Symbicort for comparison.  Update CXR. CBC w diff, Ddimer, CMET, BNP

## 2023-02-11 DIAGNOSIS — J439 Emphysema, unspecified: Secondary | ICD-10-CM | POA: Diagnosis not present

## 2023-02-23 ENCOUNTER — Encounter: Payer: Self-pay | Admitting: Physician Assistant

## 2023-02-23 ENCOUNTER — Ambulatory Visit: Payer: Medicare Other | Admitting: Physician Assistant

## 2023-02-23 ENCOUNTER — Other Ambulatory Visit (INDEPENDENT_AMBULATORY_CARE_PROVIDER_SITE_OTHER): Payer: Medicare Other

## 2023-02-23 DIAGNOSIS — R151 Fecal smearing: Secondary | ICD-10-CM

## 2023-02-23 DIAGNOSIS — Z85038 Personal history of other malignant neoplasm of large intestine: Secondary | ICD-10-CM

## 2023-02-23 DIAGNOSIS — R143 Flatulence: Secondary | ICD-10-CM

## 2023-02-23 DIAGNOSIS — Z86010 Personal history of colonic polyps: Secondary | ICD-10-CM | POA: Diagnosis not present

## 2023-02-23 LAB — COMPREHENSIVE METABOLIC PANEL
ALT: 9 U/L (ref 0–35)
AST: 14 U/L (ref 0–37)
Albumin: 4.1 g/dL (ref 3.5–5.2)
Alkaline Phosphatase: 56 U/L (ref 39–117)
BUN: 20 mg/dL (ref 6–23)
CO2: 45 mEq/L — ABNORMAL HIGH (ref 19–32)
Calcium: 10.1 mg/dL (ref 8.4–10.5)
Chloride: 91 mEq/L — ABNORMAL LOW (ref 96–112)
Creatinine, Ser: 0.72 mg/dL (ref 0.40–1.20)
GFR: 80.86 mL/min (ref >60.00)
Glucose, Bld: 148 mg/dL — ABNORMAL HIGH (ref 70–99)
Potassium: 3.5 mEq/L (ref 3.5–5.1)
Sodium: 141 mEq/L (ref 135–145)
Total Bilirubin: 0.7 mg/dL (ref 0.2–1.2)
Total Protein: 6.9 g/dL (ref 6.0–8.3)

## 2023-02-23 LAB — CBC WITH DIFFERENTIAL/PLATELET
Basophils Absolute: 0 10*3/uL (ref 0.0–0.1)
Basophils Relative: 0.4 % (ref 0.0–3.0)
Eosinophils Absolute: 0.1 10*3/uL (ref 0.0–0.7)
Eosinophils Relative: 0.8 % (ref 0.0–5.0)
HCT: 35.5 % — ABNORMAL LOW (ref 36.0–46.0)
Hemoglobin: 11.3 g/dL — ABNORMAL LOW (ref 12.0–15.0)
Lymphocytes Relative: 20.6 % (ref 12.0–46.0)
Lymphs Abs: 1.4 10*3/uL (ref 0.7–4.0)
MCHC: 31.8 g/dL (ref 30.0–36.0)
MCV: 89.1 fl (ref 78.0–100.0)
Monocytes Absolute: 0.3 10*3/uL (ref 0.1–1.0)
Monocytes Relative: 5 % (ref 3.0–12.0)
Neutro Abs: 4.9 10*3/uL (ref 1.4–7.7)
Neutrophils Relative %: 73.2 % (ref 43.0–77.0)
Platelets: 136 10*3/uL — ABNORMAL LOW (ref 150.0–400.0)
RBC: 3.98 Mil/uL (ref 3.87–5.11)
RDW: 15.1 % (ref 11.5–15.5)
WBC: 6.7 10*3/uL (ref 4.0–10.5)

## 2023-02-23 NOTE — Progress Notes (Signed)
Subjective:    Patient ID: Amy [name], female    DOB: 1945-09-07, 77 y.o.   MRN: [id number]  HPI  Amy [name] is a pleasant 77 year old African-American female, established with Dr. [name].  She was last seen in our office in December 2019 when she underwent colonoscopy. She comes in today with concerns over the past 6 months of increase in abdominal gas, some bloating and excessive flatus.  She has also been noticing intermittent fecal smearing, no full incontinence, usually this occurs with passage of flatus. He has not noticed any change in her bowel habits, usually is having bowel movements daily, but says some days she does not have a whole lot of appetite and on those days if she does not eat much may not have a bowel movement.  She has not noticed any melena or hematochezia, no diarrhea.  No abdominal pain She is not aware of any changes in her medication regimen, no increase in oxygen requirement, no dietary changes. She does drink ginger ale but not on a daily basis, says she does eat a lot of broccoli. She has been using some Tums without much benefit. Mentions that she drinks a lot of cold water and eats a lot of ice but that is not new for her  At time of last colonoscopy she was found to have a prior side-to-side anastomosis in the transverse colon, neoterminal ileum unremarkable, and 3 polyps removed which were 6 to 8 mm, 112 mm polyp and one 5 mm polyp.  Biopsy showed 2 tubular adenomas and 1 hyperplastic polyp. Patient did undergo remote right hemicolectomy for colon cancer 1999 and then had a Metachronous colon cancer in 2013 requiring partial colectomy. She has multiple other significant comorbidities including pulmonary hypertension, COPD/interstitial lung disease with chronic respiratory failure on chronic oxygen over the past several years, history of non-small cell lung cancer, prior history of renal cell CA, early stage breast cancer and diabetes mellitus.    Review of Systems  Pertinent positive and negative review of systems were noted in the above HPI section.  All other review of systems was otherwise negative.   Outpatient Encounter Medications as of 02/23/2023  Medication Sig   acetaminophen (TYLENOL) 500 MG tablet Take 1,000 mg by mouth every 6 (six) hours as needed for moderate pain.   Albuterol Sulfate (PROAIR RESPICLICK) 108 (90 Base) MCG/ACT AEPB Inhale 2 puffs into the lungs every 8 (eight) hours as needed (wheezing/shortness of breath).   Ascorbic Acid (VITAMIN C WITH ROSE HIPS) 500 MG tablet Take 500 mg by mouth daily.   atorvastatin (LIPITOR) 20 MG tablet Take 20 mg by mouth daily.    Calcium Carbonate-Vitamin D (CALCIUM-VITAMIN D) 500-200 MG-UNIT per tablet Take 1 tablet by mouth every Monday, Wednesday, and Friday.    Ferrous Gluconate (IRON) 240 (27 FE) MG TABS Take 1 tablet by mouth every morning.   guaiFENesin (MUCINEX) 600 MG 12 hr tablet Take 1 tablet (600 mg total) by mouth 2 (two) times daily.   hydrochlorothiazide 25 MG tablet Take 25 mg by mouth daily before breakfast.    losartan (COZAAR) 50 MG tablet Take 50 mg by mouth daily.   metFORMIN (GLUCOPHAGE) 500 MG tablet Take 500 mg by mouth daily with breakfast.    metoprolol (LOPRESSOR) 100 MG tablet Take 100 mg by mouth daily before breakfast.    Multiple Vitamin (MULTIVITAMIN WITH MINERALS) TABS tablet Take 1 tablet by mouth daily.   omeprazole (PRILOSEC) 20 MG capsule Take 20 mg  by mouth daily before breakfast.   PARoxetine (PAXIL) 40 MG tablet Take 40 mg by mouth daily before breakfast.    traMADol (ULTRAM) 50 MG tablet Take 50 mg by mouth every 12 (twelve) hours as needed for severe pain.   Budeson-Glycopyrrol-Formoterol (BREZTRI AEROSPHERE) 160-9-4.8 MCG/ACT AERO Inhale 2 puffs into the lungs in the morning and at bedtime. (Patient not taking: Reported on 02/23/2023)   No facility-administered encounter medications on file as of 02/23/2023.   Allergies  Allergen Reactions   Crab [Shellfish  Allergy] Anaphylaxis   Iodine Anaphylaxis   Albuterol-Ipratropium [Ipratropium-Albuterol]     Tremors, blurred vision.    Incruse Ellipta [Umeclidinium Bromide] Other (See Comments)    Trembling, blurred vision   Iohexol      Code: HIVES, Desc: pt states , swelling mouth , throat, and has difficulty breathing    Lactose Intolerance (Gi)    Adhesive [Tape] Rash   Latex Itching and Rash   Patient Active Problem List   Diagnosis Date Noted   Hypokalemia 11/03/2021   HLD (hyperlipidemia) 11/03/2021   GERD (gastroesophageal reflux disease) 11/03/2021   Hypertensive urgency 11/03/2021   Thyroid nodule 11/03/2021   Anxiety 11/03/2021   Chronic respiratory failure with hypoxia (HCC) 01/26/2017   DM (diabetes mellitus), type 2 (HCC) 12/12/2016   COPD with acute exacerbation (HCC) 12/08/2016   Interstitial lung disease (HCC) 11/25/2015   Smoking history 10/27/2015   Other emphysema (HCC) 10/27/2015   Shortness of breath 10/27/2015   Chronic cough 10/27/2015   Pulmonary hypertension (HCC) 10/27/2015   Atypical lobular hyperplasia of breast 12/09/2014   Personal history of colon cancer 09/24/2014   History of lung cancer 09/24/2014   History of kidney cancer 09/24/2014   Breast cancer of upper-outer quadrant of right female breast (HCC)    Colon cancer (HCC)    Incisional hernia 06/02/2012   Non-small cell cancer of right lung (HCC) 04/20/2012   Renal cell cancer (HCC) 04/20/2012   COLONIC POLYPS, ADENOMATOUS 12/04/2007   Social History   Socioeconomic History   Marital status: Widowed    Spouse name: Not on file   Number of children: Not on file   Years of education: Not on file   Highest education level: Not on file  Occupational History   Occupation: retired  Tobacco Use   Smoking status: Former    Current packs/day: 0.00    Average packs/day: 1.3 packs/day for 21.1 years (26.3 ttl pk-yrs)    Types: Cigarettes    Start date: 10/30/1992    Quit date: 11/20/2013    Years  since quitting: 9.2   Smokeless tobacco: Never   Tobacco comments:    quit then started up again 2years ago--smokes 1pack every 2-3 days  Vaping Use   Vaping status: Never Used  Substance and Sexual Activity   Alcohol use: No    Alcohol/week: 0.0 standard drinks of alcohol   Drug use: No   Sexual activity: Not Currently    Partners: Male    Birth control/protection: Surgical    Comment: TAH--ovaries retained  Other Topics Concern   Not on file  Social History Narrative   Not on file   Social Determinants of Health   Financial Resource Strain: Not on file  Food Insecurity: Not on file  Transportation Needs: Not on file  Physical Activity: Not on file  Stress: Not on file  Social Connections: Not on file  Intimate Partner Violence: Not on file    Amy [name]'s family history  includes Breast cancer in her maternal grandmother and mother; Colon cancer in her cousin and paternal aunt; Colon polyps in her brother; Heart disease in her father; Leukemia (age of onset: 60) in her cousin; Multiple myeloma (age of onset: 8) in her brother.      Objective:    Vitals:   02/23/23 1515  BP: 130/80  Pulse: 82    Physical Exam Well-developed well-nourished older AA female  in no acute distress.  Height, Weight,215  BMI 35.7 on O2  HEENT; nontraumatic normocephalic, EOMI, PE R LA, sclera anicteric. Oropharynx; not examined Neck; supple, no JVD Cardiovascular; regular rate and rhythm with S1-S2, no murmur rub or gallop Pulmonary; Clear bilaterally, significantly decreased breath sounds bilateral, on nasal O2 Abdomen; soft, nontender, nondistended, no palpable mass or hepatosplenomegaly, bowel sounds are active, incisional scars present Rectal; not done today Skin; benign exam, no jaundice rash or appreciable lesions Extremities; no clubbing cyanosis or edema skin warm and dry Neuro/Psych; alert and oriented x4, grossly nonfocal mood and affect appropriate        Assessment &  Plan:   #48 77 year old African-American female with 39-month history of increase in abdominal gas, flatus, and occasional fecal smearing each usually occurring with flatus, no full incontinence.  No abdominal pain or distention, no rectal bleeding or other significant change in bowel habits.  Patient with history of right hemicolectomy 1999 for colon cancer and then had a metachronous colon cancer in 2013 requiring partial colectomy. Last colonoscopy December 2019 with 5 polyps removed, 2 of these were tubular adenomas  Etiology of gassiness and flatus not entirely clear, suspect some of this may be dietary and whether if she has any increase in aerophagia with progressive COPD/interstitial lung disease requiring oxygen. Consider SIBO  #2 chronic respiratory failure/on chronic oxygen with history of COPD, interstitial lung disease and prior history of non-small cell lung cancer #3 diabetes mellitus 4.  History of renal cell CA status postresection 5.  Status post early-stage breast cancer.  Plan; start low gas diet, patient was given a copy today, avoid carbonated beverages Start Gas-X 2 with each meal Start Benefiber 1 dose daily in a glass of water for stool bulking which hopefully will reduce fecal smearing CBC and c-Met today  Have asked her to call back in 3 to 4 weeks if she does not have improvement in her symptoms and speak with my nurse, at that point we may consider cross-sectional imaging.  Due to her advanced age and significant comorbidities do not plan further surveillance colonoscopies as feel that her risk with complications from sedation outweigh benefits.  I discussed this with her today and she understands and is in agreement.    Octavius Shin S Elier Zellars PA-C 02/23/2023   Cc: Renford Dills, MD

## 2023-02-23 NOTE — Patient Instructions (Addendum)
_______________________________________________________  If your blood pressure at your visit was 140/90 or greater, please contact your primary care physician to follow up on this. _______________________________________________________  If you are age 77 or older, your body mass index should be between 23-30. Your Body mass index is 35.78 kg/m. If this is out of the aforementioned range listed, please consider follow up with your Primary Care Provider. ________________________________________________________  The [facility] providers would like to encourage you to use [facility] to communicate with providers for non-urgent requests or questions.  Due to long hold times on the telephone, sending your provider a message by [facility] may be a faster and more efficient way to get a response.  Please allow 48 business hours for a response.  Please remember that this is for non-urgent requests.  _______________________________________________________  Please purchase the following medications over the counter and take as directed:  START: Gas X two tablets with meals.  START: Benefiber daily for bowels.  Your provider has requested that you go to the basement level for lab work before leaving today. Press "B" on the elevator. The lab is located at the first door on the left as you exit the elevator.  Due to recent changes in healthcare laws, you may see the results of your imaging and laboratory studies on MyChart before your provider has had a chance to review them.  We understand that in some cases there may be results that are confusing or concerning to you. Not all laboratory results come back in the same time frame and the provider may be waiting for multiple results in order to interpret others.  Please give [place] 48 hours in order for your provider to thoroughly review all the results before contacting the office for clarification of your results.   Please call back in 3 to 4 weeks and speak to  nurse if symptoms are not improving.  Thank you for entrusting me with your care and choosing [facility].  Amy Esterwood, PA-C

## 2023-02-24 ENCOUNTER — Telehealth: Payer: Self-pay | Admitting: Physician Assistant

## 2023-02-24 NOTE — Telephone Encounter (Signed)
error 

## 2023-03-04 DIAGNOSIS — J439 Emphysema, unspecified: Secondary | ICD-10-CM | POA: Diagnosis not present

## 2023-03-14 DIAGNOSIS — J439 Emphysema, unspecified: Secondary | ICD-10-CM | POA: Diagnosis not present

## 2023-03-16 ENCOUNTER — Telehealth: Payer: Self-pay | Admitting: Internal Medicine

## 2023-03-16 NOTE — Telephone Encounter (Signed)
Pt called in to inform us she was provided a different medication (Symbicort) and it is working okay.

## 2023-03-20 ENCOUNTER — Ambulatory Visit: Payer: Medicare Other | Admitting: Internal Medicine

## 2023-04-04 DIAGNOSIS — J439 Emphysema, unspecified: Secondary | ICD-10-CM | POA: Diagnosis not present

## 2023-04-12 NOTE — Progress Notes (Deleted)
IOV 10/27/2015- Dr Marchelle Gearing-  Chief Complaint  Patient presents with   Pulmonary Consult    Pt self referral for DOE x 1 year. Pt c/o non prod cough with chest congestion x 1 week.     77 year-old morbidly obese female, previous history of 26 pack smoking quit 2 years ago. Also multiple cancer history and cancer survivor was reportedly in complete remission. Last CT scan of the chest in 2013 without any evidence of cancer in the lung. Followed by primary care physician. She reports insidious onset of shortness of breath for the last 1 year. Progressive only somewhat. Overall stable. Moderate in intensity. Ranging clothes walking one flight of stairs or doing groceries brings on her dyspnea. It is relieved by rest. For the last 2 weeks it is associated with cough which is dry but she does not feel sick. There is no fever or hemoptysis or orthopnea paroxysmal nocturnal dyspnea. She is morbidly obese with a BMI greater than 40 but it is unchanged. There is a history of mild sleep apnea on a sleep study per her history 10 years ago.  Walking desaturation test 180 5. 3 laps on room air with a full head probe: She walk only 30 feet and she desaturated to 85%. We walked her final feet and she desaturated to 81%. She improved a pulse ox greater than 92% at rest. She declined any November the oxygen therapy. Ct chest oct 2013 -  IMPRESSION: 1.  Surgical changes on the right.  No evidence of recurrent or metastatic disease. 2.  Mild centrilobular emphysema. 3. Pulmonary artery enlargement suggests pulmonary arterial hypertension. 4. Esophageal air fluid level suggests dysmotility or gastroesophageal reflux.   12/16/22- 4 yoF former smoker transferring to my care for for COPD ( I had cared for her mother, Mrs Doyce Para). Lives alone. Medical hx of Pulmonary HTN, HTN, hx NSCCA Lung, ILD, Chronic Respiratory Failure with Hypoxia, hx Colon Cancer, GERD, DM2, hx Renal Cell Ca, Anxiety/Depression, hx Breast  Ca,  - Symbicort 160, Spiriva 1.25,  -----Patient complains of SOB with exertion. Has gotten worse over the last few weeks  O2 POC 2L 85% on arival, rose with rest to 90% No oncology follow-up since the COVID pandemic.  No history of DVT.  Denies smoking in 10 years. Her portable oxygen concentrator ([employer]) cuts off-will not go any higher than 4 L but runs okay on 2 L pulse. PFT 11/18/15- moderately severe obstruction, significant response to BD, severe Diffusion defect CT chest 11/03/21- IMPRESSION: 1.6 cm left thyroid nodule. Recommend thyroid US. (Ref: J Am Coll Radiol. 2015 Feb;12(2): 143-50). Mild bibasilar scarring or subsegmental atelectasis is noted. 5 mm Peri fissural nodule seen in left upper lobe. No follow-up needed if patient is low-risk.This recommendation follows the consensus statement: Guidelines for Management of Incidental Pulmonary Nodules Detected on CT Images: From the Fleischner Society 2017; Radiology 2017; 284:228-243. Enlarged pulmonary artery trunk is noted suggesting pulmonary hypertension. Aortic Atherosclerosis (ICD10-I70.0).   04/14/23- 13 yoF former smoker transferring to my care for for COPD ( I had cared for her mother, Mrs Doyce Para). Lives alone. Medical hx of Pulmonary HTN, HTN, hx NSCCA Lung, ILD, Chronic Respiratory Failure with Hypoxia, hx Colon Cancer, GERD, DM2, hx Renal Cell Ca, Anxiety/Depression, hx Breast Ca,  - Symbicort 160, Spiriva 1.25, Sampled Breztri  ROS-see HPI   + = positive Constitutional:    weight loss, night sweats, fevers, chills, fatigue, lassitude. HEENT:    headaches, difficulty swallowing, tooth/dental problems, sore throat,  sneezing, itching, ear ache, nasal congestion, post nasal drip, snoring CV:    chest pain, orthopnea, PND, swelling in lower extremities, anasarca,                                  dizziness, palpitations Resp:   shortness of breath with exertion or at rest.                productive cough,    non-productive cough, coughing up of blood.              change in color of mucus.  wheezing.   Skin:    rash or lesions. GI:  No-   heartburn, indigestion, abdominal pain, nausea, vomiting, diarrhea,                 change in bowel habits, loss of appetite GU: dysuria, change in color of urine, no urgency or frequency.   flank pain. MS:   joint pain, stiffness, decreased range of motion, back pain. Neuro-     nothing unusual Psych:  change in mood or affect.  depression or anxiety.   memory loss.  OBJ- Physical Exam General- Alert, Oriented, Affect-appropriate, Distress- none acute, + morbidly obese, +wheelchair Skin- rash-none, lesions- none, excoriation- none Lymphadenopathy- none Head- atraumatic            Eyes- Gross vision intact, PERRLA, conjunctivae and secretions clear            Ears- Hearing, canals-normal            Nose- Clear, no-Septal dev, mucus, polyps, erosion, perforation             Throat- Mallampati II-III , mucosa clear , drainage- none, tonsils- atrophic, + dentures Neck- flexible , trachea midline, no stridor , thyroid nl, carotid no bruit Chest - symmetrical excursion , unlabored           Heart/CV- RRR , no murmur , no gallop  , no rub, nl s1 s2                           - JVD- none , +edema 1-2+, stasis changes- none, varices- none           Lung- clear to P&A, wheeze- none, cough- none , dullness-none, rub- none           Chest wall-  Abd-  Br/ Gen/ Rectal- Not done, not indicated Extrem- cyanosis- none, clubbing, none, atrophy- none, strength- nl Neuro- grossly intact to observation

## 2023-04-14 ENCOUNTER — Ambulatory Visit: Payer: Medicare Other | Admitting: Internal Medicine

## 2023-05-19 ENCOUNTER — Telehealth: Payer: Self-pay | Admitting: Internal Medicine

## 2023-05-19 MED ORDER — AZITHROMYCIN 250 MG PO TABS: 250.0000 mg | ORAL_TABLET | ORAL | 0 refills | Status: DC

## 2023-05-19 MED ORDER — PREDNISONE 10 MG PO TABS: ORAL_TABLET | ORAL | 0 refills | Status: DC

## 2023-05-19 NOTE — Telephone Encounter (Signed)
Patient is experiencing shortness of breath. She asked if she should go to ER.

## 2023-05-19 NOTE — Telephone Encounter (Signed)
Dr. Maple Hudson may be gone . Will send to Dr. Rennis Petty the day.- Tammy Parrett

## 2023-05-19 NOTE — Telephone Encounter (Signed)
Spoke with the pt  She states that she woke up at 4 am "could not hardly breathe" She is ok now at rest but gets out of breath walking across the room  She denies any wheezing or cough  She states that she has congestion in her throat and she is blowing out clear mucus from her nose  She denies fevers, aches  She is taking symbicort, mucinex and albuterol  Please advise, thanks!  Allergies  Allergen Reactions   Crab [Shellfish Allergy] Anaphylaxis   Iodine Anaphylaxis   Albuterol-Ipratropium [Ipratropium-Albuterol]     Tremors, blurred vision.    Incruse Ellipta [Umeclidinium Bromide] Other (See Comments)    Trembling, blurred vision   Iohexol      Code: HIVES, Desc: pt states , swelling mouth , throat, and has difficulty breathing    Lactose Intolerance (Gi)    Adhesive [Tape] Rash   Latex Itching and Rash

## 2023-05-19 NOTE — Telephone Encounter (Signed)
I called and spoke with the pt and notified of response from Dr Maple Hudson  She verbalized understanding  Rxs were sent to the pharmacy

## 2023-05-19 NOTE — Telephone Encounter (Signed)
I can't tell what is going on from her message.  We can try sending some medicine, but if she doesn't get better fairly quickly the she should go to the ER for evaluation. Order- Zpak  250 mg, # 6, 2 today then one daily            Prednisone 10 mg, # 20, 4 X 2 DAYS, 3 X 2 DAYS, 2 X 2 DAYS, 1 X 2 DAYS

## 2023-06-13 DIAGNOSIS — C50411 Malignant neoplasm of upper-outer quadrant of right female breast: Secondary | ICD-10-CM | POA: Diagnosis not present

## 2023-06-13 DIAGNOSIS — Z23 Encounter for immunization: Secondary | ICD-10-CM | POA: Diagnosis not present

## 2023-06-13 DIAGNOSIS — Z85038 Personal history of other malignant neoplasm of large intestine: Secondary | ICD-10-CM | POA: Diagnosis not present

## 2023-06-13 DIAGNOSIS — J439 Emphysema, unspecified: Secondary | ICD-10-CM | POA: Diagnosis not present

## 2023-06-13 DIAGNOSIS — I10 Essential (primary) hypertension: Secondary | ICD-10-CM | POA: Diagnosis not present

## 2023-06-13 DIAGNOSIS — E7800 Pure hypercholesterolemia, unspecified: Secondary | ICD-10-CM | POA: Diagnosis not present

## 2023-06-13 DIAGNOSIS — W19XXXA Unspecified fall, initial encounter: Secondary | ICD-10-CM | POA: Diagnosis not present

## 2023-06-13 DIAGNOSIS — Z0000 Encounter for general adult medical examination without abnormal findings: Secondary | ICD-10-CM | POA: Diagnosis not present

## 2023-06-13 DIAGNOSIS — Z9989 Dependence on other enabling machines and devices: Secondary | ICD-10-CM | POA: Diagnosis not present

## 2023-06-13 DIAGNOSIS — E1169 Type 2 diabetes mellitus with other specified complication: Secondary | ICD-10-CM | POA: Diagnosis not present

## 2023-06-13 DIAGNOSIS — E1122 Type 2 diabetes mellitus with diabetic chronic kidney disease: Secondary | ICD-10-CM | POA: Diagnosis not present

## 2023-06-13 DIAGNOSIS — J9611 Chronic respiratory failure with hypoxia: Secondary | ICD-10-CM | POA: Diagnosis not present

## 2023-06-14 ENCOUNTER — Other Ambulatory Visit: Payer: Self-pay | Admitting: Internal Medicine

## 2023-06-14 DIAGNOSIS — Z1231 Encounter for screening mammogram for malignant neoplasm of breast: Secondary | ICD-10-CM

## 2023-06-20 ENCOUNTER — Ambulatory Visit: Payer: Medicare Other | Admitting: Internal Medicine

## 2023-06-29 ENCOUNTER — Telehealth: Payer: Self-pay | Admitting: Internal Medicine

## 2023-06-29 NOTE — Telephone Encounter (Signed)
Inetta Fermo states needs patient's walk test information for oxygen. Inetta Fermo phone number is [phone]. Fax number is [phone].

## 2023-07-11 DIAGNOSIS — D696 Thrombocytopenia, unspecified: Secondary | ICD-10-CM | POA: Diagnosis not present

## 2023-07-11 DIAGNOSIS — I700 Atherosclerosis of aorta: Secondary | ICD-10-CM | POA: Diagnosis not present

## 2023-07-11 DIAGNOSIS — R413 Other amnesia: Secondary | ICD-10-CM | POA: Diagnosis not present

## 2023-07-11 DIAGNOSIS — C649 Malignant neoplasm of unspecified kidney, except renal pelvis: Secondary | ICD-10-CM | POA: Diagnosis not present

## 2023-07-14 NOTE — Telephone Encounter (Signed)
Pt was last seen in May 2024 and there was no walk test done  She is scheduled for f/u here 08/07/23- we can walk her then if needed   I [employer] and spoke with Inetta Fermo  She states that she just needs the last walk test info that we have- does not have to be recent   I faxed this to her from the [facility] note to [phone] Nothing further needed

## 2023-07-20 ENCOUNTER — Ambulatory Visit: Payer: Medicare Other

## 2023-08-01 ENCOUNTER — Inpatient Hospital Stay: Payer: Self-pay

## 2023-08-01 ENCOUNTER — Inpatient Hospital Stay: Payer: Self-pay | Admitting: Hematology and Oncology

## 2023-08-03 ENCOUNTER — Telehealth: Payer: Self-pay | Admitting: Hematology and Oncology

## 2023-08-03 NOTE — Telephone Encounter (Signed)
 Patient called to reschedule missed appt on 08/01/2023... Patient reschedule for 1/30

## 2023-08-08 ENCOUNTER — Ambulatory Visit: Payer: Medicare Other | Admitting: Primary Care

## 2023-08-08 ENCOUNTER — Encounter: Payer: Self-pay | Admitting: Primary Care

## 2023-08-08 NOTE — Progress Notes (Deleted)
 @Patient  ID: Amy [name], female    DOB: 16-Nov-1945, 78 y.o.   MRN: [id number]  No chief complaint on file.   Referring provider: Rexanne Ingle, MD  HPI:    Previous [facility] pulmonary encounter:  78 year-old morbidly obese female, previous history of 26 pack smoking quit 2 years ago. Also multiple cancer history and cancer survivor was reportedly in complete remission. Last CT scan of the chest in 2013 without any evidence of cancer in the lung. Followed by primary care physician. She reports insidious onset of shortness of breath for the last 1 year. Progressive only somewhat. Overall stable. Moderate in intensity. Ranging clothes walking one flight of stairs or doing groceries brings on her dyspnea. It is relieved by rest. For the last 2 weeks it is associated with cough which is dry but she does not feel sick. There is no fever or hemoptysis or orthopnea paroxysmal nocturnal dyspnea. She is morbidly obese with a BMI greater than 40 but it is unchanged. There is a history of mild sleep apnea on a sleep study per her history 10 years ago.   Walking desaturation test 180 5. 3 laps on room air with a full head probe: She walk only 30 feet and she desaturated to 85%. We walked her final feet and she desaturated to 81%. She improved a pulse ox greater than 92% at rest. She declined any November the oxygen  therapy. Ct chest oct 2013 -  IMPRESSION: 1.  Surgical changes on the right.  No evidence of recurrent or metastatic disease. 2.  Mild centrilobular emphysema. 3. Pulmonary artery enlargement suggests pulmonary arterial hypertension. 4. Esophageal air fluid level suggests dysmotility or gastroesophageal reflux.   12/16/22- 20 yoF former smoker transferring to my care for for COPD ( I had cared for her mother, Amy [name]). Lives aloone. Medical hx of Pulmonary HTN, HTN, hx NSCCA Lung, ILD, Chronic Respiratory Failure with Hypoxia, hx Colon Cancer, GERD, DM2, hx Renal Cell Ca,  Anxiety/Depression, hx Breast Ca,  - Symbicort  160, Spiriva  1.25,  -----Patient complains of SOB with exertion. Has gotten worse over the last few weeks  O2 POC 2L 85% on arival, rose with rest to 90% No oncology follow-up since the COVID pandemic.  No history of DVT.  Denies smoking in 10 years. Her portable oxygen  concentrator ([employer]) cuts off-will not go any higher than 4 L but runs okay on 2 L pulse. PFT 11/18/15- moderately severe obstruction, significant response to BD, severe Diffusion defect CT chest 11/03/21- IMPRESSION: 1.6 cm left thyroid  nodule. Recommend thyroid  US . (Ref: J Am Coll Radiol. 2015 Feb;12(2): 143-50). Mild bibasilar scarring or subsegmental atelectasis is noted. 5 mm Peri fissural nodule seen in left upper lobe. No follow-up needed if patient is low-risk.This recommendation follows the consensus statement: Guidelines for Management of Incidental Pulmonary Nodules Detected on CT Images: From the Fleischner Society 2017; Radiology 2017; 284:228-243. Enlarged pulmonary artery trunk is noted suggesting pulmonary hypertension. Aortic Atherosclerosis (ICD10-I70.0).  08/08/2023 Patient presents today for 6-8 month follow-up. Treated for exacerbation in October 20245 with zpack and prednisone  taper  Breztri  Aerosphere given in place of Symbicort /Spiriva   POC 4L through [employer]  O2 qualification documentation was sent to [facility] with [facility] in December 2024     Allergies  Allergen Reactions   Crab [Shellfish Allergy] Anaphylaxis   Iodine Anaphylaxis   Albuterol -Ipratropium [Ipratropium-Albuterol ]     Tremors, blurred vision.    Incruse Ellipta  [Umeclidinium Bromide ] Other (See Comments)    Trembling, blurred vision  Iohexol      Code: HIVES, Desc: pt states , swelling mouth , throat, and has difficulty breathing    Lactose Intolerance (Gi)    Adhesive [Tape] Rash   Latex Itching and Rash    Immunization History  Administered Date(s) Administered    Influenza, High Dose Seasonal PF 04/18/2016, 04/09/2018   Influenza,inj,Quad PF,6+ Mos 08/26/2015   Influenza-Unspecified 04/26/2017   Pneumococcal Conjugate-13 08/26/2015    Past Medical History:  Diagnosis Date   Allergy    Anemia    Anxiety    Arthritis    Asthma exacerbation 11/03/2021   Blood dyscrasia    Sickle Cell traits   Blood transfusion without reported diagnosis 1999   prbc S/P COLON SURGERY   Breast cancer, right breast (HCC) 10/03/12   Cancer (HCC)    colon,renal,lung   Cataract    NO SURGERY   Chronic kidney disease 1999   RIGHT KIDNEY REMOVED   Colon cancer (HCC) 1999, 2013   Depression    Diabetes mellitus    diet controlled   GERD (gastroesophageal reflux disease)    H/O: lung cancer    Heart murmur    Hernia    Near umbilicus   History of colon cancer    History of kidney cancer    Hot flashes    5 yr pill for breast CA causing hot flashes   HX: breast cancer 2014   --right breast   Hypertension    Does not see a cardiologist   Internal hemorrhoids    Interstitial lung disease (HCC) 11/25/2015   Lung cancer (HCC) 2001   Neuromuscular disorder (HCC)    PERIPHERAL NEUROPATHY FEET   Shortness of breath    with exertion    Sickle cell trait (HCC)    Tubular adenoma of colon    Ulcer    HX YEARS AGO    Tobacco History: Social History   Tobacco Use  Smoking Status Former   Current packs/day: 0.00   Average packs/day: 1.3 packs/day for 21.1 years (26.3 ttl pk-yrs)   Types: Cigarettes   Start date: 10/30/1992   Quit date: 11/20/2013   Years since quitting: 9.7  Smokeless Tobacco Never  Tobacco Comments   quit then started up again 2years ago--smokes 1pack every 2-3 days   Counseling given: Not Answered Tobacco comments: quit then started up again 2years ago--smokes 1pack every 2-3 days   Outpatient Medications Prior to Visit  Medication Sig Dispense Refill   acetaminophen  (TYLENOL ) 500 MG tablet Take 1,000 mg by mouth every 6 (six) hours  as needed for moderate pain.     Albuterol  Sulfate (PROAIR  RESPICLICK) 108 (90 Base) MCG/ACT AEPB Inhale 2 puffs into the lungs every 8 (eight) hours as needed (wheezing/shortness of breath). 1 each 1   Ascorbic Acid  (VITAMIN C  WITH ROSE HIPS) 500 MG tablet Take 500 mg by mouth daily.     atorvastatin  (LIPITOR) 20 MG tablet Take 20 mg by mouth daily.      azithromycin  (ZITHROMAX ) 250 MG tablet Take 1 tablet (250 mg total) by mouth as directed. 6 tablet 0   Budeson-Glycopyrrol-Formoterol  (BREZTRI  AEROSPHERE) 160-9-4.8 MCG/ACT AERO Inhale 2 puffs into the lungs in the morning and at bedtime. (Patient not taking: Reported on 02/23/2023) 1 each 5   Calcium  Carbonate-Vitamin D  (CALCIUM -VITAMIN D ) 500-200 MG-UNIT per tablet Take 1 tablet by mouth every Monday, Wednesday, and Friday.      Ferrous Gluconate  (IRON ) 240 (27 FE) MG TABS Take 1 tablet by  mouth every morning.     guaiFENesin  (MUCINEX ) 600 MG 12 hr tablet Take 1 tablet (600 mg total) by mouth 2 (two) times daily. 20 tablet 0   hydrochlorothiazide  25 MG tablet Take 25 mg by mouth daily before breakfast.      losartan  (COZAAR ) 50 MG tablet Take 50 mg by mouth daily.     metFORMIN  (GLUCOPHAGE ) 500 MG tablet Take 500 mg by mouth daily with breakfast.      metoprolol  (LOPRESSOR ) 100 MG tablet Take 100 mg by mouth daily before breakfast.      Multiple Vitamin (MULTIVITAMIN WITH MINERALS) TABS tablet Take 1 tablet by mouth daily. 30 tablet 0   omeprazole (PRILOSEC) 20 MG capsule Take 20 mg by mouth daily before breakfast.     PARoxetine  (PAXIL ) 40 MG tablet Take 40 mg by mouth daily before breakfast.      predniSONE  (DELTASONE ) 10 MG tablet 4 x 2 days, 3 x 2 days, 2 x 2 days, 1 x 2 days, then stop 20 tablet 0   traMADol  (ULTRAM ) 50 MG tablet Take 50 mg by mouth every 12 (twelve) hours as needed for severe pain.     No facility-administered medications prior to visit.      Review of Systems  Review of Systems   Physical Exam  LMP 07/26/1983   Physical Exam   Lab Results:  CBC    Component Value Date/Time   WBC 6.7 02/23/2023 1604   RBC 3.98 02/23/2023 1604   HGB 11.3 (L) 02/23/2023 1604   HGB 12.8 03/01/2017 1337   HCT 35.5 (L) 02/23/2023 1604   HCT 38.7 03/01/2017 1337   PLT 136.0 (L) 02/23/2023 1604   PLT 143 (L) 03/01/2017 1337   MCV 89.1 02/23/2023 1604   MCV 87.8 03/01/2017 1337   MCH 28.7 11/06/2021 0417   MCHC 31.8 02/23/2023 1604   RDW 15.1 02/23/2023 1604   RDW 14.7 (H) 03/01/2017 1337   LYMPHSABS 1.4 02/23/2023 1604   LYMPHSABS 1.5 03/01/2017 1337   MONOABS 0.3 02/23/2023 1604   MONOABS 0.3 03/01/2017 1337   EOSABS 0.1 02/23/2023 1604   EOSABS 0.1 03/01/2017 1337   BASOSABS 0.0 02/23/2023 1604   BASOSABS 0.0 03/01/2017 1337    BMET    Component Value Date/Time   NA 141 02/23/2023 1604   NA 141 03/01/2017 1337   K 3.5 02/23/2023 1604   K 3.6 03/01/2017 1337   CL 91 (L) 02/23/2023 1604   CL 100 12/27/2012 1418   CO2 45 (H) 02/23/2023 1604   CO2 35 (H) 03/01/2017 1337   GLUCOSE 148 (H) 02/23/2023 1604   GLUCOSE 157 (H) 03/01/2017 1337   GLUCOSE 107 (H) 12/27/2012 1418   BUN 20 02/23/2023 1604   BUN 21.3 03/01/2017 1337   CREATININE 0.72 02/23/2023 1604   CREATININE 1.1 03/01/2017 1337   CALCIUM  10.1 02/23/2023 1604   CALCIUM  10.5 (H) 03/01/2017 1337   GFRNONAA >60 11/07/2021 0400   GFRAA >60 12/09/2016 0649    BNP    Component Value Date/Time   BNP 112.2 (H) 11/03/2021 1040    ProBNP    Component Value Date/Time   PROBNP 86.0 12/16/2022 1057    Imaging: No results found.   Assessment & Plan:   No problem-specific Assessment & Plan notes found for this encounter.     Almarie LELON Ferrari, NP 08/08/2023

## 2023-08-09 ENCOUNTER — Ambulatory Visit: Payer: Medicare Other

## 2023-08-15 DIAGNOSIS — E119 Type 2 diabetes mellitus without complications: Secondary | ICD-10-CM | POA: Diagnosis not present

## 2023-08-24 ENCOUNTER — Inpatient Hospital Stay: Payer: Medicare Other | Attending: Hematology and Oncology | Admitting: Hematology and Oncology

## 2023-08-24 ENCOUNTER — Inpatient Hospital Stay: Payer: Medicare Other

## 2023-08-24 VITALS — BP 159/75 | HR 78 | Temp 98.3°F | Resp 20 | Ht 65.0 in | Wt 210.6 lb

## 2023-08-24 DIAGNOSIS — Z85038 Personal history of other malignant neoplasm of large intestine: Secondary | ICD-10-CM | POA: Diagnosis not present

## 2023-08-24 DIAGNOSIS — Z853 Personal history of malignant neoplasm of breast: Secondary | ICD-10-CM | POA: Diagnosis not present

## 2023-08-24 DIAGNOSIS — Z08 Encounter for follow-up examination after completed treatment for malignant neoplasm: Secondary | ICD-10-CM | POA: Diagnosis not present

## 2023-08-24 DIAGNOSIS — Z170 Estrogen receptor positive status [ER+]: Secondary | ICD-10-CM

## 2023-08-24 NOTE — Progress Notes (Signed)
[facility] CONSULT NOTE  Patient Care Team: Renford Dills, MD as PCP - General (Internal Medicine)  CHIEF COMPLAINTS/PURPOSE OF CONSULTATION:  Follow-up of history of DCIS  HISTORY OF PRESENTING ILLNESS:   History of Present Illness   The patient, with ductal carcinoma in situ, presents for follow-up regarding her breast health and upcoming mammogram. She was referred by Dr. Nehemiah Settle for follow-up on her breast health.  She was diagnosed with ductal carcinoma in situ (DCIS) in 2014 and completed five years of treatment with medication until 2019. Since then, she has not been on any medication for this condition.  She has not kept up with her annual mammograms since the COVID-19 pandemic, as she stayed at home to avoid exposure.  She experiences 'maybe just a little' discomfort in her breast but does not specify any significant pain or other symptoms.  She mentions having keloids, which she inherited from her mother, and notes that her niece also has them on her face. However, she states that the breast scar did not cause any keloid formation.      I reviewed her records extensively and collaborated the history with the patient.  SUMMARY OF ONCOLOGIC HISTORY: Oncology History  Non-small cell cancer of right lung (HCC)  06/02/2000 Surgery   T1 N0 non-small cell lung cancer removed from the right lung with a VATS procedure by Dr. Edwyna Shell   Breast cancer of upper-outer quadrant of right female breast (HCC)  12/20/2012 Surgery   Right breast lumpectomy: DCIS, resection for margin 01/21/2013, ER 100%, PR 100%; did not undergo radiation therapy   01/30/2013 -  Anti-estrogen oral therapy   Aromasin 25 mg daily   01/19/2015 Surgery   Rt Lumpectomy: No cancer seen   Colon cancer (HCC)  05/31/2012 Surgery   Right: Segmental resection: Invasive well-differentiated mucinous adenocarcinoma invading the submucosa, no angiolymphatic invasion, 0/9 lymph nodes positive, T1 N0 M0, loss  of expression of PMS 2, and MLH 1      MEDICAL HISTORY:  Past Medical History:  Diagnosis Date   Allergy    Anemia    Anxiety    Arthritis    Asthma exacerbation 11/03/2021   Blood dyscrasia    Sickle Cell traits   Blood transfusion without reported diagnosis 1999   prbc S/P COLON SURGERY   Breast cancer, right breast (HCC) 10/03/12   Cancer (HCC)    colon,renal,lung   Cataract    NO SURGERY   Chronic kidney disease 1999   RIGHT KIDNEY REMOVED   Colon cancer (HCC) 1999, 2013   Depression    Diabetes mellitus    diet controlled   GERD (gastroesophageal reflux disease)    H/O: lung cancer    Heart murmur    Hernia    Near umbilicus   History of colon cancer    History of kidney cancer    Hot flashes    5 yr pill for breast CA causing hot flashes   HX: breast cancer 2014   --right breast   Hypertension    Does not see a cardiologist   Internal hemorrhoids    Interstitial lung disease (HCC) 11/25/2015   Lung cancer (HCC) 2001   Neuromuscular disorder (HCC)    PERIPHERAL NEUROPATHY FEET   Shortness of breath    with exertion    Sickle cell trait (HCC)    Tubular adenoma of colon    Ulcer    HX YEARS AGO    SURGICAL HISTORY: Past Surgical History:  Procedure Laterality Date   ABDOMINAL HYSTERECTOMY  80's   fibroids   BIOPSY  07/02/2018   Procedure: BIOPSY;  Surgeon: Meryl Dare, MD;  Location: [facility];  Service: Endoscopy;;   BREAST BIOPSY     Bilateral cysts/benign   BREAST BIOPSY Right 10/03/12   ER/PR +   BREAST BIOPSY Right 12/20/2012   Procedure: Right breast Excisional Biopsy;  Surgeon: Emelia Loron, MD;  Location: [facility] OR;  Service: General;  Laterality: Right;   BREAST LUMPECTOMY     BREAST LUMPECTOMY WITH NEEDLE LOCALIZATION Right 12/20/2012   Procedure: BREAST LUMPECTOMY WITH NEEDLE LOCALIZATION;  Surgeon: Emelia Loron, MD;  Location: [facility];  Service: General;  Laterality: Right;   BREAST LUMPECTOMY WITH RADIOACTIVE SEED LOCALIZATION  Right 01/19/2015   Procedure: RIGHT BREAST LUMPECTOMY WITH RADIOACTIVE SEED LOCALIZATION;  Surgeon: Emelia Loron, MD;  Location: [facility];  Service: General;  Laterality: Right;   CARDIAC CATHETERIZATION N/A 03/02/2016   Procedure: Right Heart Cath;  Surgeon: Dolores Patty, MD;  Location: [facility];  Service: Cardiovascular;  Laterality: N/A;   CATARACT EXTRACTION, BILATERAL  01/2018 &02/2018   COLECTOMY  05-31-2012   laparoscopic ,possible open   COLON SURGERY     chemo   COLONOSCOPY     COLONOSCOPY WITH PROPOFOL N/A 07/02/2018   Procedure: COLONOSCOPY WITH PROPOFOL;  Surgeon: Meryl Dare, MD;  Location: [facility];  Service: Endoscopy;  Laterality: N/A;   KIDNEY SURGERY  1999   right/chemo kidney removed for cancer   LUNG LOBECTOMY     right lower   PARTIAL COLECTOMY  05/31/2012   Procedure: PARTIAL COLECTOMY;  Surgeon: Emelia Loron, MD;  Location: [facility];  Service: General;  Laterality: N/A;   PARTIAL HYSTERECTOMY     POLYPECTOMY     POLYPECTOMY  07/02/2018   Procedure: POLYPECTOMY;  Surgeon: Meryl Dare, MD;  Location: [facility];  Service: Endoscopy;;   RE-EXCISION OF BREAST CANCER,SUPERIOR MARGINS Right 01/21/2013   Procedure: RE-EXCISION OF BREAST CANCER,SUPERIOR MARGINS;  Surgeon: Emelia Loron, MD;  Location: [facility];  Service: General;  Laterality: Right;   TONSILLECTOMY     as child   TUBAL LIGATION      SOCIAL HISTORY: Social History   Socioeconomic History   Marital status: Widowed    Spouse name: Not on file   Number of children: Not on file   Years of education: Not on file   Highest education level: Not on file  Occupational History   Occupation: retired  Tobacco Use   Smoking status: Former    Current packs/day: 0.00    Average packs/day: 1.3 packs/day for 21.1 years (26.3 ttl pk-yrs)    Types: Cigarettes    Start date: 10/30/1992    Quit date: 11/20/2013    Years since quitting: 9.7   Smokeless tobacco:  Never   Tobacco comments:    quit then started up again 2years ago--smokes 1pack every 2-3 days  Vaping Use   Vaping status: Never Used  Substance and Sexual Activity   Alcohol use: No    Alcohol/week: 0.0 standard drinks of alcohol   Drug use: No   Sexual activity: Not Currently    Partners: Male    Birth control/protection: Surgical    Comment: TAH--ovaries retained  Other Topics Concern   Not on file  Social History Narrative   Not on file   Social Drivers of Health   Financial Resource Strain: Not on file  Food Insecurity: Not  on file  Transportation Needs: Not on file  Physical Activity: Not on file  Stress: Not on file  Social Connections: Not on file  Intimate Partner Violence: Not on file    FAMILY HISTORY: Family History  Problem Relation Age of Onset   Colon polyps Brother    Multiple myeloma Brother 104   Breast cancer Mother        dx in her 67s   Heart disease Father    Colon cancer Paternal Aunt    Breast cancer Maternal Grandmother    Colon cancer Cousin        2 paternal first cousins   Leukemia Cousin 11   Esophageal cancer Neg Hx    Rectal cancer Neg Hx    Stomach cancer Neg Hx     ALLERGIES:  is allergic to crab [shellfish allergy], iodine, albuterol-ipratropium [ipratropium-albuterol], incruse ellipta [umeclidinium bromide], iohexol, lactose intolerance (gi), adhesive [tape], and latex.  MEDICATIONS:  Current Outpatient Medications  Medication Sig Dispense Refill   acetaminophen (TYLENOL) 500 MG tablet Take 1,000 mg by mouth every 6 (six) hours as needed for moderate pain.     Albuterol Sulfate (PROAIR RESPICLICK) 108 (90 Base) MCG/ACT AEPB Inhale 2 puffs into the lungs every 8 (eight) hours as needed (wheezing/shortness of breath). 1 each 1   Ascorbic Acid (VITAMIN C WITH ROSE HIPS) 500 MG tablet Take 500 mg by mouth daily.     atorvastatin (LIPITOR) 20 MG tablet Take 20 mg by mouth daily.      azithromycin (ZITHROMAX) 250 MG tablet Take 1  tablet (250 mg total) by mouth as directed. 6 tablet 0   Budeson-Glycopyrrol-Formoterol (BREZTRI AEROSPHERE) 160-9-4.8 MCG/ACT AERO Inhale 2 puffs into the lungs in the morning and at bedtime. (Patient not taking: Reported on 02/23/2023) 1 each 5   Calcium Carbonate-Vitamin D (CALCIUM-VITAMIN D) 500-200 MG-UNIT per tablet Take 1 tablet by mouth every Monday, Wednesday, and Friday.      Ferrous Gluconate (IRON) 240 (27 FE) MG TABS Take 1 tablet by mouth every morning.     guaiFENesin (MUCINEX) 600 MG 12 hr tablet Take 1 tablet (600 mg total) by mouth 2 (two) times daily. 20 tablet 0   hydrochlorothiazide 25 MG tablet Take 25 mg by mouth daily before breakfast.      losartan (COZAAR) 50 MG tablet Take 50 mg by mouth daily.     metFORMIN (GLUCOPHAGE) 500 MG tablet Take 500 mg by mouth daily with breakfast.      metoprolol (LOPRESSOR) 100 MG tablet Take 100 mg by mouth daily before breakfast.      Multiple Vitamin (MULTIVITAMIN WITH MINERALS) TABS tablet Take 1 tablet by mouth daily. 30 tablet 0   omeprazole (PRILOSEC) 20 MG capsule Take 20 mg by mouth daily before breakfast.     PARoxetine (PAXIL) 40 MG tablet Take 40 mg by mouth daily before breakfast.      predniSONE (DELTASONE) 10 MG tablet 4 x 2 days, 3 x 2 days, 2 x 2 days, 1 x 2 days, then stop 20 tablet 0   traMADol (ULTRAM) 50 MG tablet Take 50 mg by mouth every 12 (twelve) hours as needed for severe pain.     No current facility-administered medications for this visit.    REVIEW OF SYSTEMS:   Constitutional: Denies fevers, chills or abnormal night sweats Breast:  Denies any palpable lumps or discharge All other systems were reviewed with the patient and are negative.  PHYSICAL EXAMINATION: ECOG PERFORMANCE STATUS: 3 -  Symptomatic, >50% confined to bed  Vitals:   08/24/23 1319 08/24/23 1321  BP: (!) 157/85 (!) 159/75  Pulse: 78   Resp: 20   Temp: 98.3 F (36.8 C)    Filed Weights   08/24/23 1319  Weight: 210 lb 9.6 oz (95.5 kg)     GENERAL:alert, no distress and comfortable BREAST: No palpable nodules in breast. No palpable axillary or supraclavicular lymphadenopathy (exam performed in the presence of a chaperone)   LABORATORY DATA:  I have reviewed the data as listed Lab Results  Component Value Date   WBC 6.7 02/23/2023   HGB 11.3 (L) 02/23/2023   HCT 35.5 (L) 02/23/2023   MCV 89.1 02/23/2023   PLT 136.0 (L) 02/23/2023   Lab Results  Component Value Date   NA 141 02/23/2023   K 3.5 02/23/2023   CL 91 (L) 02/23/2023   CO2 45 (H) 02/23/2023    RADIOGRAPHIC STUDIES: I have personally reviewed the radiological reports and agreed with the findings in the report.  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 She underwent re-excision on 01/21/13. Pathology revealed no residual carcinoma or atypia. Did not require radiation currently on hormonal therapy with Aromasin 25 mg daily from 04/02/2013 completed April 13, 2018     Breast cancer surveillance: 1. Breast exam done 08/24/2023 is normal 2. Mammograms  scheduled for 08/25/2023   Return to clinic in 1 year for follow-up Assessment and Plan    Ductal Carcinoma In Situ (DCIS) Diagnosed with DCIS in 2014, completed a five-year course of medication until 2019. No mammogram since the COVID-19 pandemic. Reports mild breast discomfort. Physical exam normal. - Perform mammogram tomorrow - Continue regular follow-up for breast health  General Health Maintenance Lapsed annual mammograms since the COVID-19 pandemic. Mammogram scheduled for tomorrow. - Encourage regular annual mammograms - Discuss the importance of routine health screenings    All questions were answered. The patient knows to call the clinic with any problems, questions or concerns.    Amy Meek, MD 08/24/23

## 2023-08-24 NOTE — Assessment & Plan Note (Signed)
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 She underwent re-excision on 01/21/13. Pathology revealed no residual carcinoma or atypia. Did not require radiation currently on hormonal therapy with Aromasin 25 mg daily from 04/02/2013   Aromasin toxicities: No major side effects from Aromasin other than occasional hot flashes and joint pains.   Breast cancer surveillance: 1. Breast exam done 08/24/2023 is normal 2. Mammograms  scheduled for 08/25/2023   Return to clinic in 1 year for follow-up

## 2023-08-25 ENCOUNTER — Ambulatory Visit: Payer: Medicare Other

## 2023-08-29 ENCOUNTER — Ambulatory Visit: Payer: Medicare Other | Admitting: Physical Therapy

## 2023-08-29 NOTE — Therapy (Deleted)
 OUTPATIENT PHYSICAL THERAPY LOWER EXTREMITY EVALUATION   Patient Name: Amy [name] MRN: [id number] DOB:Aug 08, 1945, 78 y.o., female Today's Date: 08/29/2023  END OF SESSION:   Past Medical History:  Diagnosis Date   Allergy    Anemia    Anxiety    Arthritis    Asthma exacerbation 11/03/2021   Blood dyscrasia    Sickle Cell traits   Blood transfusion without reported diagnosis 1999   prbc S/P COLON SURGERY   Breast cancer, right breast (HCC) 10/03/12   Cancer (HCC)    colon,renal,lung   Cataract    NO SURGERY   Chronic kidney disease 1999   RIGHT KIDNEY REMOVED   Colon cancer (HCC) 1999, 2013   Depression    Diabetes mellitus    diet controlled   GERD (gastroesophageal reflux disease)    H/O: lung cancer    Heart murmur    Hernia    Near umbilicus   History of colon cancer    History of kidney cancer    Hot flashes    5 yr pill for breast CA causing hot flashes   HX: breast cancer 2014   --right breast   Hypertension    Does not see a cardiologist   Internal hemorrhoids    Interstitial lung disease (HCC) 11/25/2015   Lung cancer (HCC) 2001   Neuromuscular disorder (HCC)    PERIPHERAL NEUROPATHY FEET   Shortness of breath    with exertion    Sickle cell trait (HCC)    Tubular adenoma of colon    Ulcer    HX YEARS AGO   Past Surgical History:  Procedure Laterality Date   ABDOMINAL HYSTERECTOMY  80's   fibroids   BIOPSY  07/02/2018   Procedure: BIOPSY;  Surgeon: Aneita Gwendlyn DASEN, MD;  Location: [facility];  Service: Endoscopy;;   BREAST BIOPSY     Bilateral cysts/benign   BREAST BIOPSY Right 10/03/12   ER/PR +   BREAST BIOPSY Right 12/20/2012   Procedure: Right breast Excisional Biopsy;  Surgeon: Donnice Bury, MD;  Location: [facility];  Service: General;  Laterality: Right;   BREAST LUMPECTOMY     BREAST LUMPECTOMY WITH NEEDLE LOCALIZATION Right 12/20/2012   Procedure: BREAST LUMPECTOMY WITH NEEDLE LOCALIZATION;  Surgeon: Donnice Bury, MD;  Location:  [facility] OR;  Service: General;  Laterality: Right;   BREAST LUMPECTOMY WITH RADIOACTIVE SEED LOCALIZATION Right 01/19/2015   Procedure: RIGHT BREAST LUMPECTOMY WITH RADIOACTIVE SEED LOCALIZATION;  Surgeon: Donnice Bury, MD;  Location: [facility];  Service: General;  Laterality: Right;   CARDIAC CATHETERIZATION N/A 03/02/2016   Procedure: Right Heart Cath;  Surgeon: Toribio JONELLE Fuel, MD;  Location: [facility] INVASIVE CV LAB;  Service: Cardiovascular;  Laterality: N/A;   CATARACT EXTRACTION, BILATERAL  01/2018 &02/2018   COLECTOMY  05-31-2012   laparoscopic ,possible open   COLON SURGERY     chemo   COLONOSCOPY     COLONOSCOPY WITH PROPOFOL  N/A 07/02/2018   Procedure: COLONOSCOPY WITH PROPOFOL ;  Surgeon: Aneita Gwendlyn DASEN, MD;  Location: [facility];  Service: Endoscopy;  Laterality: N/A;   KIDNEY SURGERY  1999   right/chemo kidney removed for cancer   LUNG LOBECTOMY     right lower   PARTIAL COLECTOMY  05/31/2012   Procedure: PARTIAL COLECTOMY;  Surgeon: Donnice Bury, MD;  [facility]: [facility];  Service: General;  Laterality: N/A;   PARTIAL HYSTERECTOMY     POLYPECTOMY     POLYPECTOMY  07/02/2018   Procedure: POLYPECTOMY;  Surgeon:  Aneita Gwendlyn DASEN, MD;  Location: [facility];  Service: Endoscopy;;   RE-EXCISION OF BREAST CANCER,SUPERIOR MARGINS Right 01/21/2013   Procedure: RE-EXCISION OF BREAST CANCER,SUPERIOR MARGINS;  Surgeon: Donnice Bury, MD;  Location: [facility];  Service: General;  Laterality: Right;   TONSILLECTOMY     as child   TUBAL LIGATION     Patient Active Problem List   Diagnosis Date Noted   Hypokalemia 11/03/2021   HLD (hyperlipidemia) 11/03/2021   GERD (gastroesophageal reflux disease) 11/03/2021   Hypertensive urgency 11/03/2021   Thyroid  nodule 11/03/2021   Anxiety 11/03/2021   Chronic respiratory failure with hypoxia (HCC) 01/26/2017   DM (diabetes mellitus), type 2 (HCC) 12/12/2016   COPD with acute exacerbation (HCC) 12/08/2016   Interstitial  lung disease (HCC) 11/25/2015   Smoking history 10/27/2015   Other emphysema (HCC) 10/27/2015   Shortness of breath 10/27/2015   Chronic cough 10/27/2015   Pulmonary hypertension (HCC) 10/27/2015   Atypical lobular hyperplasia of breast 12/09/2014   Personal history of colon cancer 09/24/2014   History of lung cancer 09/24/2014   History of kidney cancer 09/24/2014   Breast cancer of upper-outer quadrant of right female breast (HCC)    Colon cancer (HCC)    Incisional hernia 06/02/2012   Non-small cell cancer of right lung (HCC) 04/20/2012   Renal cell cancer (HCC) 04/20/2012   COLONIC POLYPS, ADENOMATOUS 12/04/2007    PCP:   Rexanne Ingle, MD  REFERRING PROVIDER:   Rexanne Ingle, MD  REFERRING DIAG: W19.XXXA (ICD-10-CM) - Fall  THERAPY DIAG:  No diagnosis found.  Rationale for Evaluation and Treatment: Rehabilitation  ONSET DATE: ***  SUBJECTIVE:   SUBJECTIVE STATEMENT: *** Fall, balance  PERTINENT HISTORY: CKD, colon cancer, DB, Depression, lung cancer, HTN, right lower lung lobectomy, R breast cancer, cardiac cath PAIN:  Are you having pain? Yes: NPRS scale: *** Pain location: *** Pain description: *** Aggravating factors: *** Relieving factors: *** and ***  PRECAUTIONS: {Therapy precautions:24002}  RED FLAGS: {PT Red Flags:29287}   WEIGHT BEARING RESTRICTIONS: {Yes ***/No:24003}  FALLS:  Has patient fallen in last 6 months? {fallsyesno:27318}  LIVING ENVIRONMENT: Lives with: {OPRC lives with:25569::lives with their family} Lives in: {Lives in:25570} Stairs: {opstairs:27293} Has following equipment at home: {Assistive devices:23999}  OCCUPATION: ***  PLOF: {PLOF:24004}  PATIENT GOALS: ***  NEXT MD VISIT: ***  OBJECTIVE:  Note: Objective measures were completed at Evaluation unless otherwise noted.  DIAGNOSTIC FINDINGS: ***  PATIENT SURVEYS:  {rehab surveys:24030}  COGNITION: Overall cognitive status:  {cognition:24006}     SENSATION: {sensation:27233}  EDEMA:  {edema:24020}  MUSCLE LENGTH: Hamstrings: Right *** deg; Left *** deg Debby test: Right *** deg; Left *** deg  POSTURE: {posture:25561}  PALPATION: ***   LE Measurements Lower Extremity Right EVAL Left EVAL   A/PROM MMT A/PROM MMT  Hip Flexion      Hip Extension      Hip Abduction      Hip Adduction      Hip Internal rotation      Hip External rotation      Knee Flexion      Knee Extension      Ankle Dorsiflexion      Ankle Plantarflexion      Ankle Inversion      Ankle Eversion       (Blank rows = not tested) * pain   LOWER EXTREMITY SPECIAL TESTS:  {LEspecialtests:26242}  FUNCTIONAL TESTS:  {Functional tests:24029}  GAIT: Distance walked: *** Assistive device utilized: {Assistive devices:23999} Level  of assistance: {Levels of assistance:24026} Comments: ***                                                                                                                                TREATMENT DATE: ***  08/29/2023  Therapeutic Exercise:  Aerobic: Supine: Prone:  Seated:  Standing: Neuromuscular Re-education: Manual Therapy: Therapeutic Activity: Self Care: Trigger Point Dry Needling:  Modalities:    PATIENT EDUCATION:  Education details: on current presentation, on HEP, on clinical outcomes score and POC Person educated: Patient Education method: Explanation, Demonstration, and Handouts Education comprehension: verbalized understanding   HOME EXERCISE PROGRAM: ***  ASSESSMENT:  CLINICAL IMPRESSION: Patient is a *** y.o. *** who was seen today for physical therapy evaluation and treatment for ***.   OBJECTIVE IMPAIRMENTS: {opptimpairments:25111}.   ACTIVITY LIMITATIONS: {activitylimitations:27494}  PARTICIPATION LIMITATIONS: {participationrestrictions:25113}  PERSONAL FACTORS: {Personal factors:25162} are also affecting patient's functional outcome.   REHAB  POTENTIAL: {rehabpotential:25112}  CLINICAL DECISION MAKING: {clinical decision making:25114}  EVALUATION COMPLEXITY: {Evaluation complexity:25115}   GOALS: Goals reviewed with patient? yes  SHORT TERM GOALS: Target date: {follow up:25551} *** MAKE TEXT EDITABLE Patient will be independent in self management strategies to improve quality of life and functional outcomes. Baseline: New Program Goal status: INITIAL  2.  Patient will report at least 50% improvement in overall symptoms and/or function to demonstrate improved functional mobility Baseline: 0% better Goal status: INITIAL  3.  *** Baseline:  Goal status: INITIAL  4.  *** Baseline:  Goal status: INITIAL    LONG TERM GOALS: Target date: {follow up:25551} *** MAKE TEXT EDITABLE  Patient will report at least 75% improvement in overall symptoms and/or function to demonstrate improved functional mobility Baseline: 0% better Goal status: INITIAL  2.  Patient will improve score on FOTO outcomes measure to projected score to demonstrate overall improved function and QOL Baseline: see above Goal status: INITIAL  3.  *** Baseline:  Goal status: INITIAL  4.  *** Baseline:  Goal status: INITIAL    PLAN:  PT FREQUENCY: {rehab frequency:25116}  PT DURATION: {rehab duration:25117}  PLANNED INTERVENTIONS: 97110-Therapeutic exercises, 97530- Therapeutic activity, 97112- Neuromuscular re-education, 97535- Self Care, 02859- Manual therapy, [id number]- Gait training, [phone]- Orthotic Fit/training, [phone]- Canalith repositioning, [id number]- Aquatic Therapy, 97014- Electrical stimulation (unattended), [phone]- Ionotophoresis 4mg /ml Dexamethasone , Patient/Family education, Balance training, Stair training, Taping, Dry Needling, Joint mobilization, Joint manipulation, Spinal manipulation, Spinal mobilization, Cryotherapy, and Moist heat   PLAN FOR NEXT SESSION: ***   8:22 AM, 08/29/23 Olivia Church, DPT Physical Therapy with  [facility]

## 2023-09-05 ENCOUNTER — Ambulatory Visit: Payer: Medicare Other

## 2023-09-18 ENCOUNTER — Encounter: Payer: Self-pay | Admitting: Physical Therapy

## 2023-09-18 ENCOUNTER — Ambulatory Visit: Payer: Medicare Other | Admitting: Physical Therapy

## 2023-09-18 DIAGNOSIS — M6281 Muscle weakness (generalized): Secondary | ICD-10-CM

## 2023-09-18 DIAGNOSIS — R2689 Other abnormalities of gait and mobility: Secondary | ICD-10-CM

## 2023-09-18 NOTE — Therapy (Signed)
 OUTPATIENT PHYSICAL THERAPY LOWER EXTREMITY EVALUATION   Patient Name: Amy [name] MRN: [id number] DOB:June 18, 1946, 78 y.o., female Today's Date: 09/18/2023  END OF SESSION:  PT End of Session - 09/18/23 1533     Visit Number 1    Number of Visits 16    Date for PT Re-Evaluation 11/13/23    Authorization Type [employer] medicare    PT Start Time 1310    PT Stop Time 1345    PT Time Calculation (min) 35 min    Equipment Utilized During Treatment Gait belt    Activity Tolerance Patient tolerated treatment well    Behavior During Therapy WFL for tasks assessed/performed             Past Medical History:  Diagnosis Date   Allergy    Anemia    Anxiety    Arthritis    Asthma exacerbation 11/03/2021   Blood dyscrasia    Sickle Cell traits   Blood transfusion without reported diagnosis 1999   prbc S/P COLON SURGERY   Breast cancer, right breast (HCC) 10/03/12   Cancer (HCC)    colon,renal,lung   Cataract    NO SURGERY   Chronic kidney disease 1999   RIGHT KIDNEY REMOVED   Colon cancer (HCC) 1999, 2013   Depression    Diabetes mellitus    diet controlled   GERD (gastroesophageal reflux disease)    H/O: lung cancer    Heart murmur    Hernia    Near umbilicus   History of colon cancer    History of kidney cancer    Hot flashes    5 yr pill for breast CA causing hot flashes   HX: breast cancer 2014   --right breast   Hypertension    Does not see a cardiologist   Internal hemorrhoids    Interstitial lung disease (HCC) 11/25/2015   Lung cancer (HCC) 2001   Neuromuscular disorder (HCC)    PERIPHERAL NEUROPATHY FEET   Shortness of breath    with exertion    Sickle cell trait (HCC)    Tubular adenoma of colon    Ulcer    HX YEARS AGO   Past Surgical History:  Procedure Laterality Date   ABDOMINAL HYSTERECTOMY  80's   fibroids   BIOPSY  07/02/2018   Procedure: BIOPSY;  Surgeon: Meryl Dare, MD;  [facility];  Service: Endoscopy;;   BREAST BIOPSY      Bilateral cysts/benign   BREAST BIOPSY Right 10/03/12   ER/PR +   BREAST BIOPSY Right 12/20/2012   Procedure: Right breast Excisional Biopsy;  Surgeon: Emelia Loron, MD;  Location: [facility] OR;  Service: General;  Laterality: Right;   BREAST LUMPECTOMY     BREAST LUMPECTOMY WITH NEEDLE LOCALIZATION Right 12/20/2012   Procedure: BREAST LUMPECTOMY WITH NEEDLE LOCALIZATION;  Surgeon: Emelia Loron, MD;  Location: [facility] OR;  Service: General;  Laterality: Right;   BREAST LUMPECTOMY WITH RADIOACTIVE SEED LOCALIZATION Right 01/19/2015   Procedure: RIGHT BREAST LUMPECTOMY WITH RADIOACTIVE SEED LOCALIZATION;  Surgeon: Emelia Loron, MD;  Location: [facility];  Service: General;  Laterality: Right;   CARDIAC CATHETERIZATION N/A 03/02/2016   Procedure: Right Heart Cath;  Surgeon: Dolores Patty, MD;  Location: [facility] INVASIVE CV LAB;  Service: Cardiovascular;  Laterality: N/A;   CATARACT EXTRACTION, BILATERAL  01/2018 &02/2018   COLECTOMY  05-31-2012   laparoscopic ,possible open   COLON SURGERY     chemo   COLONOSCOPY  COLONOSCOPY WITH PROPOFOL N/A 07/02/2018   Procedure: COLONOSCOPY WITH PROPOFOL;  Surgeon: Meryl Dare, MD;  Location: [facility];  Service: Endoscopy;  Laterality: N/A;   KIDNEY SURGERY  1999   right/chemo kidney removed for cancer   LUNG LOBECTOMY     right lower   PARTIAL COLECTOMY  05/31/2012   Procedure: PARTIAL COLECTOMY;  Surgeon: Emelia Loron, MD;  [facility]: [facility];  Service: General;  Laterality: N/A;   PARTIAL HYSTERECTOMY     POLYPECTOMY     POLYPECTOMY  07/02/2018   Procedure: POLYPECTOMY;  Surgeon: Meryl Dare, MD;  [facility];  Service: Endoscopy;;   RE-EXCISION OF BREAST CANCER,SUPERIOR MARGINS Right 01/21/2013   Procedure: RE-EXCISION OF BREAST CANCER,SUPERIOR MARGINS;  Surgeon: Emelia Loron, MD;  Location: [facility];  Service: General;  Laterality: Right;   TONSILLECTOMY     as child   TUBAL LIGATION     Patient  Active Problem List   Diagnosis Date Noted   Hypokalemia 11/03/2021   HLD (hyperlipidemia) 11/03/2021   GERD (gastroesophageal reflux disease) 11/03/2021   Hypertensive urgency 11/03/2021   Thyroid nodule 11/03/2021   Anxiety 11/03/2021   Chronic respiratory failure with hypoxia (HCC) 01/26/2017   DM (diabetes mellitus), type 2 (HCC) 12/12/2016   COPD with acute exacerbation (HCC) 12/08/2016   Interstitial lung disease (HCC) 11/25/2015   Smoking history 10/27/2015   Other emphysema (HCC) 10/27/2015   Shortness of breath 10/27/2015   Chronic cough 10/27/2015   Pulmonary hypertension (HCC) 10/27/2015   Atypical lobular hyperplasia of breast 12/09/2014   Personal history of colon cancer 09/24/2014   History of lung cancer 09/24/2014   History of kidney cancer 09/24/2014   Breast cancer of upper-outer quadrant of right female breast (HCC)    Colon cancer (HCC)    Incisional hernia 06/02/2012   Non-small cell cancer of right lung (HCC) 04/20/2012   Renal cell cancer (HCC) 04/20/2012   COLONIC POLYPS, ADENOMATOUS 12/04/2007    PCP: Dr. Renford Dills  REFERRING PROVIDER: Dr. Renford Dills   REFERRING DIAG: frequent falls.   THERAPY DIAG:  Other abnormalities of gait and mobility  Muscle weakness (generalized)  Rationale for Evaluation and Treatment: Rehabilitation  ONSET DATE:    SUBJECTIVE:   SUBJECTIVE STATEMENT: O2:  92-96 Pulse: 91-97 Pt referred for balance. States she does a lot of falling. Up to 4 in the past year. Does not feel secure with balance and mobility. Feels unsure about having and stepping over oxygen cord at home.  States ability to care for herself, but would like help, doesn't have anyone/family, etc to help her. She states she tires easily. Denies any dizziness, but looses her footing.   Pt on 3 L 02 at all times, using portable unit today  Does drive, orders groceries for delivery.  Lives alone. Has 3 steps to get in house, hand rail on 1 side, uses  cane.   States she "does a lot of falling"  Knees hurt/OA, but hurt also because she keeps falling on them.  Neuropathy-states toes feel numb but Not whole foot.  Memory .Marland Kitchen Mildly Decreased states trouble remembering things.  Life alert:   Yes   PERTINENT HISTORY: COPD, DM,  Breast, kidney, lung CA, Chemo, neuropathy, frequent falls , 3 L O2   PAIN:  Are you having pain? Yes: NPRS scale: up to 5/10 Pain location: Bil knees R>L  Pain description: sore,  Aggravating factors: increased walking, activity,  Relieving factors: none stated   PRECAUTIONS:  Fall  WEIGHT BEARING RESTRICTIONS: No  FALLS:  Has patient fallen in last 6 months? Yes. Number of falls 4 1 fall on porch, fell onto R knee. Was reaching to plug something in 1 out of car dropped keys, was reaching down to get 1 accident, slipped in bathroom on slippery floor.    PLOF: Independent  PATIENT GOALS:  Improve balance, falls, and strength for walking    NEXT MD VISIT:   OBJECTIVE:   DIAGNOSTIC FINDINGS:   PATIENT SURVEYS:    COGNITION: Overall cognitive status: Within functional limits for tasks assessed     SENSATION: WFL, some decreased sensation in feet.   EDEMA:   POSTURE:     PALPATION:   LOWER EXTREMITY ROM: Shoulder ROM: L: WFL,   R: limited 100 for elevation and painful.  Knees: WFL Hips: WFL   LOWER EXTREMITY MMT:  MMT Left eval Right  eval  Hip flexion 4- 4-  Hip extension    Hip abduction    Hip adduction    Hip internal rotation    Hip external rotation    Knee flexion 4+ 4+  Knee extension 4 4  Ankle dorsiflexion    Ankle plantarflexion    Ankle inversion    Ankle eversion     (Blank rows = not tested)  LOWER EXTREMITY SPECIAL TESTS:   FUNCTIONAL TESTS:  Sharlene Motts- see below   GAIT:    [facility] PT Assessment - 09/18/23 0001       Standardized Balance Assessment   Standardized Balance Assessment Berg Balance Test      Berg Balance Test   Sit to Stand Able to stand  without using hands and stabilize independently    Standing Unsupported Able to stand safely 2 minutes    Sitting with Back Unsupported but Feet Supported on Floor or Stool Able to sit safely and securely 2 minutes    Stand to Sit Controls descent by using hands    Transfers Able to transfer safely, definite need of hands    Standing Unsupported with Eyes Closed Able to stand 10 seconds with supervision    Standing Unsupported with Feet Together Able to place feet together independently and stand for 1 minute with supervision    From Standing, Reach Forward with Outstretched Arm Can reach forward >12 cm safely (5")    From Standing Position, Pick up Object from Floor Able to pick up shoe safely and easily    From Standing Position, Turn to Look Behind Over each Shoulder Looks behind one side only/other side shows less weight shift    Turn 360 Degrees Able to turn 360 degrees safely but slowly    Standing Unsupported, Alternately Place Feet on Step/Stool Able to complete >2 steps/needs minimal assist    Standing Unsupported, One Foot in Front Needs help to step but can hold 15 seconds    Standing on One Leg Tries to lift leg/unable to hold 3 seconds but remains standing independently    Total Score 39              TODAY'S TREATMENT:  DATE:     PATIENT EDUCATION:  Education details: PT POC, Exam findings, Person educated: Patient Education method: Explanation, Demonstration, Tactile cues, Verbal cues, and Handouts Education comprehension: verbalized understanding, returned demonstration, verbal cues required, tactile cues required, and needs further education   HOME EXERCISE PROGRAM:   ASSESSMENT:  CLINICAL IMPRESSION: Patient presents with primary complaint of decreased balance. She has increasing difficulty with functional activity and has had several  falls. She has decreased LE strength, and decreased dynamic balance with decreased score on berg. Further testing for gait and stairs to be done next visit.  She has limited endurance due to her medical history, deconditioning and wears oxygen at all times. Pt will  benefit from skilled PT to improve deficits and pain and to return to PLOF.   OBJECTIVE IMPAIRMENTS: Abnormal gait, cardiopulmonary status limiting activity, decreased activity tolerance, decreased balance, decreased coordination, decreased endurance, decreased knowledge of use of DME, decreased mobility, difficulty walking, decreased strength, decreased safety awareness, impaired sensation, improper body mechanics, and pain.   ACTIVITY LIMITATIONS: lifting, bending, standing, stairs, transfers, and locomotion level  PARTICIPATION LIMITATIONS: meal prep, cleaning, laundry, shopping, and community activity  PERSONAL FACTORS: Past/current experiences, Time since onset of injury/illness/exacerbation, and 3+ comorbidities: cancer, copd, falls, neuropathy  are also affecting patient's functional outcome.   REHAB POTENTIAL: Good  CLINICAL DECISION MAKING: Unstable/unpredictable  EVALUATION COMPLEXITY: High   GOALS: Goals reviewed with patient? Yes  SHORT TERM GOALS: Target date: 10/09/2023  Pt to be independent with initial HEP   Goal status: INITIAL    LONG TERM GOALS: Target date: 11/13/2023   Pt to be independent with final HEP  Goal status: INITIAL  2.  Pt to demo improved BERG score by at least 5 points   Goal status: INITIAL  3.  Pt to demo ambulation and stairs ability and safety to be [facility] for pt age and diagnosis, to decrease risk of falls.   Goal status: INITIAL     PLAN:  PT FREQUENCY: 1-2x/week  PT DURATION: 8 weeks  PLANNED INTERVENTIONS: Therapeutic exercises, Therapeutic activity, Neuromuscular re-education, Patient/Family education, Self Care, Joint mobilization, Joint manipulation, Stair  training, Orthotic/Fit training, DME instructions, Aquatic Therapy, Dry Needling, Electrical stimulation, Cryotherapy, Moist heat, Taping, Ultrasound, Ionotophoresis 4mg /ml Dexamethasone, Manual therapy,  Vasopneumatic device, Traction, Spinal manipulation, Spinal mobilization,Balance training, Gait training,   PLAN FOR NEXT SESSION:    Sedalia Muta, PT, DPT 3:48 PM  09/18/23

## 2023-09-20 ENCOUNTER — Ambulatory Visit: Payer: Medicare Other | Admitting: Primary Care

## 2023-09-21 ENCOUNTER — Ambulatory Visit
Admission: RE | Admit: 2023-09-21 | Discharge: 2023-09-21 | Disposition: A | Discharge status: Home / Self Care | Payer: Medicare Other | Source: Ambulatory Visit | Attending: Internal Medicine | Admitting: Internal Medicine

## 2023-09-21 DIAGNOSIS — Z1231 Encounter for screening mammogram for malignant neoplasm of breast: Secondary | ICD-10-CM | POA: Diagnosis not present

## 2023-09-28 ENCOUNTER — Encounter: Payer: Medicare Other | Admitting: Physical Therapy

## 2023-09-28 ENCOUNTER — Telehealth: Payer: Self-pay | Admitting: *Deleted

## 2023-09-28 NOTE — Telephone Encounter (Signed)
 Copied from CRM [id number]. Topic: Appointments - Appointment Cancel/Reschedule >> Sep 28, 2023 10:23 AM Truddie Crumble wrote: Patient/patient representative is calling to cancel or reschedule an appointment. Refer to attachments for appointment information. Patient called stating she want to cancel her treatment appointment with Sedalia Muta today

## 2023-10-05 ENCOUNTER — Encounter: Payer: Medicare Other | Admitting: Physical Therapy

## 2023-10-12 ENCOUNTER — Encounter: Payer: Medicare Other | Admitting: Physical Therapy

## 2023-10-17 ENCOUNTER — Telehealth: Payer: Self-pay | Admitting: Gastroenterology

## 2023-10-17 ENCOUNTER — Ambulatory Visit: Admitting: Gastroenterology

## 2023-10-17 ENCOUNTER — Ambulatory Visit: Payer: Medicare Other | Admitting: Gastroenterology

## 2023-10-17 NOTE — Telephone Encounter (Signed)
 Good afternoon Amy [name],    Patient called stating that she has an appointment with you today at 3:00 and is not going to be able to make her appointment because she is having issues with Diarrhea. Patient wanted me to let you know and also she wanted to speak with you regarding her issues.

## 2023-10-19 ENCOUNTER — Encounter: Payer: Medicare Other | Admitting: Physical Therapy

## 2023-10-23 DIAGNOSIS — C3491 Malignant neoplasm of unspecified part of right bronchus or lung: Secondary | ICD-10-CM | POA: Diagnosis not present

## 2023-10-23 DIAGNOSIS — C50411 Malignant neoplasm of upper-outer quadrant of right female breast: Secondary | ICD-10-CM | POA: Diagnosis not present

## 2023-10-23 DIAGNOSIS — Z85038 Personal history of other malignant neoplasm of large intestine: Secondary | ICD-10-CM | POA: Diagnosis not present

## 2023-10-23 DIAGNOSIS — I10 Essential (primary) hypertension: Secondary | ICD-10-CM | POA: Diagnosis not present

## 2023-10-30 ENCOUNTER — Ambulatory Visit: Payer: Medicare Other | Admitting: Primary Care

## 2023-11-04 NOTE — Progress Notes (Signed)
 HPI F former smoker transferring to my care for for COPD ( I had cared for her mother, Mrs Acey Ace). Lives aloone. Medical hx of Pulmonary HTN, HTN, hx NSCCA Lung, ILD, Chronic Respiratory Failure with Hypoxia, hx Colon Cancer, GERD, DM2, hx Renal Cell Ca, Anxiety/Depression, hx Breast Ca,  O2 POC 2L 85% on arival, rose with rest to 90% Her portable oxygen  concentrator (Inogen) cuts off-will not go any higher than 4 L but runs okay on 2 L pulse. PFT 11/18/15- moderately severe obstruction, significant response to BD, severe Diffusion defect Walking desaturation test 180 5. 3 laps on room air with a full head probe: She walked only 30 feet and she desaturated to 85%. We walked her final feet and she desaturated to 81%. She improved a pulse ox greater than 92% at rest.  ==================================================================================================================================   12/16/22- 76 yoF former smoker transferring to my care for for COPD ( I had cared for her mother, Mrs Acey Ace). Lives aloone. Medical hx of Pulmonary HTN, HTN, hx NSCCA Lung, ILD, Chronic Respiratory Failure with Hypoxia, hx Colon Cancer, GERD, DM2, hx Renal Cell Ca, Anxiety/Depression, hx Breast Ca, Obesity, ASCVD,  - Symbicort  160, Spiriva  1.25,  -----Patient complains of SOB with exertion. Has gotten worse over the last few weeks  O2 POC 2L 85% on arival, rose with rest to 90% No oncology follow-up since the COVID pandemic.  No history of DVT.  Denies smoking in 10 years. Her portable oxygen  concentrator (Inogen) cuts off-will not go any higher than 4 L but runs okay on 2 L pulse. PFT 11/18/15- moderately severe obstruction, significant response to BD, severe Diffusion defect CT chest 11/03/21- IMPRESSION: 1.6 cm left thyroid  nodule. Recommend thyroid  US . (Ref: J Am Coll Radiol. 2015 Feb;12(2): 143-50). Mild bibasilar scarring or subsegmental atelectasis is noted. 5 mm Peri fissural nodule seen  in left upper lobe. No follow-up needed if patient is low-risk.This recommendation follows the consensus statement: Guidelines for Management of Incidental Pulmonary Nodules Detected on CT Images: From the Fleischner Society 2017; Radiology 2017; 284:228-243. Enlarged pulmonary artery trunk is noted suggesting pulmonary hypertension. Aortic Atherosclerosis (ICD10-I70.0).  11/06/23- 59 yoF former smoker transferred to my care for for COPD ( I had cared for her mother, Mrs Acey Ace). Lives alone. Medical hx of Pulmonary HTN, HTN, hx NSCCA Lung, ILD, Chronic Respiratory Failure with Hypoxia, hx Colon Cancer, GERD, DM2, hx Renal Cell Ca, Anxiety/Depression, hx Breast Ca, Obesity, ASCVD,  - Symbicort  160, Spiriva  1.25, Breztri , Proair  hf,  O2 2L/  Inogen/ Synapse Disliked Breztri - back on Symbicort  CXR 12/16/22 IMPRESSION: Low lung volumes, with no evidence of acute cardiopulmonary disease  Discussed the use of AI scribe software for clinical note transcription with the patient, who gave verbal consent to proceed.  History of Present Illness   The patient, with a history of COPD, chronic respiratory failure with hypoxia,  and allergies, presents with ongoing allergy symptoms. She is currently using home oxygen  from Inogen, with a setting of three for her portable concentrator. She is able to walk around her home without the need for a wheelchair, but requires it for longer distances. She denies significant coughing or wheezing.  She recently tried a sample of Breztri , but did not like it and returned to using her Symbicort  inhaler. She also uses a rescue inhaler as needed. She reports a dry mouth at night, which causes discomfort and a sensation of difficulty breathing.  The patient also mentions an upcoming dental procedure to have some teeth pulled. She  is aware she needs to inform her dentist about her inhaler and oxygen  use.     Assessment and Plan:    Chronic Respiratory Failure with  Hypoxia Chronic Obstructive Pulmonary Disease (COPD) She uses home oxygen  at 3 liters, tolerates Symbicort  better than Breztri , and aims to maintain open airways and manage symptoms with inhalers. - Continue Symbicort  inhaler. - Refill rescue inhaler. - Order chest x-ray today. - Send oxygen  orders to [facility] for any changes.  Xerostomia Nighttime xerostomia likely due to medications or oxygen  therapy. - Recommend Biotene mouthwash. - Recommend Xylimelts.  Dental health She plans teeth extraction and needs to inform her dentist about current medications for safe management. - Advise her to inform dentist about current medications.     ROS-see HPI   + = positive Constitutional:    weight loss, night sweats, fevers, chills, fatigue, lassitude. HEENT:    headaches, difficulty swallowing, tooth/dental problems, sore throat,       sneezing, itching, ear ache, nasal congestion, post nasal drip, snoring CV:    chest pain, orthopnea, PND, swelling in lower extremities, anasarca,                                   dizziness, palpitations Resp:   shortness of breath with exertion or at rest.                productive cough,   non-productive cough, coughing up of blood.              change in color of mucus.  wheezing.   Skin:    rash or lesions. GI:  No-   heartburn, indigestion, abdominal pain, nausea, vomiting, diarrhea,                 change in bowel habits, loss of appetite GU: dysuria, change in color of urine, no urgency or frequency.   flank pain. MS:   joint pain, stiffness, decreased range of motion, back pain. Neuro-     nothing unusual Psych:  change in mood or affect.  depression or anxiety.   memory loss.  OBJ- Physical Exam General- Alert, Oriented, Affect-appropriate, Distress- none acute, + morbidly obese, +wheelchair Skin- rash-none, lesions- none, excoriation- none Lymphadenopathy- none Head- atraumatic            Eyes- Gross vision intact, PERRLA, conjunctivae and  secretions clear            Ears- Hearing, canals-normal            Nose- Clear, no-Septal dev, mucus, polyps, erosion, perforation             Throat- Mallampati II-III , mucosa clear , drainage- none, tonsils- atrophic, + dentures Neck- flexible , trachea midline, no stridor , thyroid  nl, carotid no bruit Chest - symmetrical excursion , unlabored           Heart/CV- RRR , no murmur , no gallop  , no rub, nl s1 s2                           - JVD- none , +edema 1-2+, stasis changes- none, varices- none           Lung- clear to P&A, wheeze- none, cough- none , dullness-none, rub- none           Chest wall-  Abd-  Br/ Gen/ Rectal- Not  done, not indicated Extrem- cyanosis- none, clubbing, none, atrophy- none, strength- nl Neuro- grossly intact to observation

## 2023-11-06 ENCOUNTER — Ambulatory Visit: Admitting: Internal Medicine

## 2023-11-06 ENCOUNTER — Encounter: Payer: Self-pay | Admitting: Internal Medicine

## 2023-11-06 ENCOUNTER — Ambulatory Visit

## 2023-11-06 VITALS — BP 122/76 | HR 76 | Temp 98.1°F | Ht 65.0 in | Wt 195.4 lb

## 2023-11-06 DIAGNOSIS — J449 Chronic obstructive pulmonary disease, unspecified: Secondary | ICD-10-CM

## 2023-11-06 DIAGNOSIS — Z87891 Personal history of nicotine dependence: Secondary | ICD-10-CM | POA: Diagnosis not present

## 2023-11-06 DIAGNOSIS — R682 Dry mouth, unspecified: Secondary | ICD-10-CM | POA: Diagnosis not present

## 2023-11-06 DIAGNOSIS — Z9981 Dependence on supplemental oxygen: Secondary | ICD-10-CM

## 2023-11-06 DIAGNOSIS — J9611 Chronic respiratory failure with hypoxia: Secondary | ICD-10-CM

## 2023-11-06 MED ORDER — BUDESONIDE-FORMOTEROL FUMARATE 160-4.5 MCG/ACT IN AERO: INHALATION_SPRAY | RESPIRATORY_TRACT | 12 refills | Status: AC

## 2023-11-06 MED ORDER — PROAIR RESPICLICK 108 (90 BASE) MCG/ACT IN AEPB: INHALATION_SPRAY | RESPIRATORY_TRACT | 12 refills | Status: AC

## 2023-11-06 NOTE — Patient Instructions (Signed)
 Order- CXR     dx COPD mixed type  Scripts sent refilling your albuterol rescue inhaler and Symbicort  For dry mouth- try:  they are over the counter-  Biotene mouthwash- rinse your mouth with this at bedtime or as needed  XyliMelts-  allow to melt in mouth for dryness as needed  We can continue your oxygen at 2-3L from home concentrator and 3L from your portable concentrator

## 2023-11-19 ENCOUNTER — Encounter: Payer: Self-pay | Admitting: Internal Medicine

## 2023-11-22 DIAGNOSIS — I10 Essential (primary) hypertension: Secondary | ICD-10-CM | POA: Diagnosis not present

## 2023-11-22 DIAGNOSIS — C50411 Malignant neoplasm of upper-outer quadrant of right female breast: Secondary | ICD-10-CM | POA: Diagnosis not present

## 2023-11-22 DIAGNOSIS — Z85038 Personal history of other malignant neoplasm of large intestine: Secondary | ICD-10-CM | POA: Diagnosis not present

## 2023-11-22 DIAGNOSIS — C3491 Malignant neoplasm of unspecified part of right bronchus or lung: Secondary | ICD-10-CM | POA: Diagnosis not present

## 2023-12-19 ENCOUNTER — Telehealth: Payer: Self-pay | Admitting: Internal Medicine

## 2023-12-19 NOTE — Telephone Encounter (Signed)
 Cmn received from [facility] for portable O2 and O2 concentrator.

## 2023-12-22 NOTE — Telephone Encounter (Signed)
 CMN faxed successfully and signed.

## 2023-12-23 DIAGNOSIS — Z85038 Personal history of other malignant neoplasm of large intestine: Secondary | ICD-10-CM | POA: Diagnosis not present

## 2023-12-23 DIAGNOSIS — I10 Essential (primary) hypertension: Secondary | ICD-10-CM | POA: Diagnosis not present

## 2023-12-23 DIAGNOSIS — C50411 Malignant neoplasm of upper-outer quadrant of right female breast: Secondary | ICD-10-CM | POA: Diagnosis not present

## 2023-12-23 DIAGNOSIS — C3491 Malignant neoplasm of unspecified part of right bronchus or lung: Secondary | ICD-10-CM | POA: Diagnosis not present

## 2024-01-22 DIAGNOSIS — C50411 Malignant neoplasm of upper-outer quadrant of right female breast: Secondary | ICD-10-CM | POA: Diagnosis not present

## 2024-01-22 DIAGNOSIS — C3491 Malignant neoplasm of unspecified part of right bronchus or lung: Secondary | ICD-10-CM | POA: Diagnosis not present

## 2024-01-22 DIAGNOSIS — I10 Essential (primary) hypertension: Secondary | ICD-10-CM | POA: Diagnosis not present

## 2024-01-22 DIAGNOSIS — Z85038 Personal history of other malignant neoplasm of large intestine: Secondary | ICD-10-CM | POA: Diagnosis not present

## 2024-02-02 ENCOUNTER — Telehealth: Payer: Self-pay

## 2024-02-02 DIAGNOSIS — J441 Chronic obstructive pulmonary disease with (acute) exacerbation: Secondary | ICD-10-CM

## 2024-02-02 DIAGNOSIS — E1169 Type 2 diabetes mellitus with other specified complication: Secondary | ICD-10-CM

## 2024-02-06 ENCOUNTER — Telehealth: Payer: Self-pay | Admitting: *Deleted

## 2024-02-06 NOTE — Progress Notes (Unsigned)
 Complex Care Management Note Care Guide Note  02/06/2024 Name: Amy [name] MRN: [id number] DOB: [birth year]   Complex Care Management Outreach Attempts: An unsuccessful telephone outreach was attempted today to offer the patient information about available complex care management services.  Follow Up Plan:  Additional outreach attempts will be made to offer the patient complex care management information and services.   Encounter Outcome:  No Answer  Harlene Satterfield  [facility]  [facility], [facility] Guide  Direct Dial: [phone]  Fax [phone]

## 2024-02-07 NOTE — Progress Notes (Unsigned)
 Complex Care Management Note Care Guide Note  02/07/2024 Name: Amy [name] MRN: [id number] DOB: 09-01-1945   Complex Care Management Outreach Attempts: A second unsuccessful outreach was attempted today to offer the patient with information about available complex care management services.  Follow Up Plan:  Additional outreach attempts will be made to offer the patient complex care management information and services.   Encounter Outcome:  No Answer  Harlene Satterfield  [facility]  [facility], [facility] Guide  Direct Dial: [phone]  Fax [phone]

## 2024-02-08 NOTE — Progress Notes (Signed)
 Complex Care Management Note Care Guide Note  02/08/2024 Name: Amy [name] MRN: [id number] DOB: [birth year]   Complex Care Management Outreach Attempts: A third unsuccessful outreach was attempted today to offer the patient with information about available complex care management services.  Follow Up Plan:  No further outreach attempts will be made at this time. We have been unable to contact the patient to offer or enroll patient in complex care management services.  Encounter Outcome:  No Answer  Harlene Satterfield  [facility]  [facility], [facility] Guide  Direct Dial: [phone]  Fax [phone]

## 2024-02-09 ENCOUNTER — Encounter (HOSPITAL_COMMUNITY): Payer: Self-pay

## 2024-02-09 ENCOUNTER — Observation Stay (HOSPITAL_COMMUNITY)
Admission: EM | Admit: 2024-02-09 | Discharge: 2024-02-10 | Disposition: A | Discharge status: Home / Self Care | Attending: Emergency Medicine | Admitting: Emergency Medicine

## 2024-02-09 ENCOUNTER — Emergency Department (HOSPITAL_COMMUNITY)

## 2024-02-09 ENCOUNTER — Other Ambulatory Visit: Payer: Self-pay

## 2024-02-09 DIAGNOSIS — R9389 Abnormal findings on diagnostic imaging of other specified body structures: Secondary | ICD-10-CM | POA: Diagnosis not present

## 2024-02-09 DIAGNOSIS — R0600 Dyspnea, unspecified: Secondary | ICD-10-CM | POA: Diagnosis not present

## 2024-02-09 DIAGNOSIS — Z91040 Latex allergy status: Secondary | ICD-10-CM | POA: Insufficient documentation

## 2024-02-09 DIAGNOSIS — R0602 Shortness of breath: Secondary | ICD-10-CM | POA: Diagnosis not present

## 2024-02-09 DIAGNOSIS — E119 Type 2 diabetes mellitus without complications: Secondary | ICD-10-CM

## 2024-02-09 DIAGNOSIS — E785 Hyperlipidemia, unspecified: Secondary | ICD-10-CM | POA: Diagnosis present

## 2024-02-09 DIAGNOSIS — C189 Malignant neoplasm of colon, unspecified: Secondary | ICD-10-CM | POA: Diagnosis not present

## 2024-02-09 DIAGNOSIS — J9611 Chronic respiratory failure with hypoxia: Secondary | ICD-10-CM | POA: Diagnosis present

## 2024-02-09 DIAGNOSIS — D649 Anemia, unspecified: Secondary | ICD-10-CM

## 2024-02-09 DIAGNOSIS — K219 Gastro-esophageal reflux disease without esophagitis: Secondary | ICD-10-CM | POA: Diagnosis not present

## 2024-02-09 DIAGNOSIS — J441 Chronic obstructive pulmonary disease with (acute) exacerbation: Principal | ICD-10-CM | POA: Diagnosis present

## 2024-02-09 DIAGNOSIS — C50411 Malignant neoplasm of upper-outer quadrant of right female breast: Secondary | ICD-10-CM | POA: Diagnosis not present

## 2024-02-09 DIAGNOSIS — Z743 Need for continuous supervision: Secondary | ICD-10-CM | POA: Diagnosis not present

## 2024-02-09 DIAGNOSIS — C3491 Malignant neoplasm of unspecified part of right bronchus or lung: Secondary | ICD-10-CM | POA: Diagnosis present

## 2024-02-09 DIAGNOSIS — I272 Pulmonary hypertension, unspecified: Secondary | ICD-10-CM | POA: Diagnosis present

## 2024-02-09 DIAGNOSIS — I10 Essential (primary) hypertension: Secondary | ICD-10-CM | POA: Insufficient documentation

## 2024-02-09 DIAGNOSIS — J849 Interstitial pulmonary disease, unspecified: Secondary | ICD-10-CM | POA: Diagnosis not present

## 2024-02-09 HISTORY — DX: Chronic obstructive pulmonary disease, unspecified: J44.9

## 2024-02-09 LAB — BASIC METABOLIC PANEL WITH GFR
Anion gap: 11 (ref 5–15)
BUN: 19 mg/dL (ref 8–23)
CO2: 41 mmol/L — ABNORMAL HIGH (ref 22–32)
Calcium: 9.6 mg/dL (ref 8.9–10.3)
Chloride: 88 mmol/L — ABNORMAL LOW (ref 98–111)
Creatinine, Ser: 0.62 mg/dL (ref 0.44–1.00)
GFR, Estimated: >60 mL/min (ref >60)
Glucose, Bld: 136 mg/dL — ABNORMAL HIGH (ref 70–99)
Potassium: 3.7 mmol/L (ref 3.5–5.1)
Sodium: 140 mmol/L (ref 135–145)

## 2024-02-09 LAB — VITAMIN B12: Vitamin B-12: 703 pg/mL (ref 180–914)

## 2024-02-09 LAB — CBC
HCT: 32.2 % — ABNORMAL LOW (ref 36.0–46.0)
Hemoglobin: 10.1 g/dL — ABNORMAL LOW (ref 12.0–15.0)
MCH: 29.3 pg (ref 26.0–34.0)
MCHC: 31.4 g/dL (ref 30.0–36.0)
MCV: 93.3 fL (ref 80.0–100.0)
Platelets: 154 K/uL (ref 150–400)
RBC: 3.45 MIL/uL — ABNORMAL LOW (ref 3.87–5.11)
RDW: 15.1 % (ref 11.5–15.5)
WBC: 5.8 K/uL (ref 4.0–10.5)
nRBC: 0 % (ref 0.0–0.2)

## 2024-02-09 LAB — IRON AND TIBC
Iron: 44 ug/dL (ref 28–170)
Saturation Ratios: 18 % (ref 10.4–31.8)
TIBC: 251 ug/dL (ref 250–450)
UIBC: 207 ug/dL

## 2024-02-09 LAB — TROPONIN I (HIGH SENSITIVITY): Troponin I (High Sensitivity): 4 ng/L (ref <18)

## 2024-02-09 LAB — D-DIMER, QUANTITATIVE: D-Dimer, Quant: 0.49 ug{FEU}/mL (ref 0.00–0.50)

## 2024-02-09 LAB — GLUCOSE, CAPILLARY: Glucose-Capillary: 204 mg/dL — ABNORMAL HIGH (ref 70–99)

## 2024-02-09 LAB — FERRITIN: Ferritin: 623 ng/mL — ABNORMAL HIGH (ref 11–307)

## 2024-02-09 MED ORDER — ENOXAPARIN SODIUM 40 MG/0.4ML IJ SOSY
40.0000 mg | PREFILLED_SYRINGE | INTRAMUSCULAR | Status: DC
  Administered 2024-02-09: 40 mg via SUBCUTANEOUS
  Filled 2024-02-09: qty 0.4

## 2024-02-09 MED ORDER — GUAIFENESIN ER 600 MG PO TB12
600.0000 mg | ORAL_TABLET | Freq: Two times a day (BID) | ORAL | Status: DC
  Administered 2024-02-09 – 2024-02-10 (×2): 600 mg via ORAL
  Filled 2024-02-09 (×2): qty 1

## 2024-02-09 MED ORDER — ONDANSETRON HCL 4 MG PO TABS: 4.0000 mg | ORAL_TABLET | Freq: Four times a day (QID) | ORAL | Status: DC | PRN

## 2024-02-09 MED ORDER — PREDNISONE 20 MG PO TABS
40.0000 mg | ORAL_TABLET | Freq: Every day | ORAL | Status: DC
  Administered 2024-02-10: 40 mg via ORAL
  Filled 2024-02-09: qty 2

## 2024-02-09 MED ORDER — FLUTICASONE FUROATE-VILANTEROL 200-25 MCG/ACT IN AEPB
1.0000 | INHALATION_SPRAY | Freq: Every day | RESPIRATORY_TRACT | Status: DC
  Administered 2024-02-10: 1 via RESPIRATORY_TRACT
  Filled 2024-02-09: qty 28

## 2024-02-09 MED ORDER — ONDANSETRON HCL 4 MG/2ML IJ SOLN: 4.0000 mg | Freq: Four times a day (QID) | INTRAMUSCULAR | Status: DC | PRN

## 2024-02-09 MED ORDER — ALBUTEROL SULFATE (2.5 MG/3ML) 0.083% IN NEBU
5.0000 mg | INHALATION_SOLUTION | Freq: Once | RESPIRATORY_TRACT | Status: AC
  Administered 2024-02-09: 5 mg via RESPIRATORY_TRACT
  Filled 2024-02-09: qty 6

## 2024-02-09 MED ORDER — PREDNISONE 20 MG PO TABS
40.0000 mg | ORAL_TABLET | Freq: Once | ORAL | Status: AC
  Administered 2024-02-09: 40 mg via ORAL
  Filled 2024-02-09: qty 2

## 2024-02-09 MED ORDER — ALBUTEROL SULFATE (2.5 MG/3ML) 0.083% IN NEBU
2.5000 mg | INHALATION_SOLUTION | Freq: Four times a day (QID) | RESPIRATORY_TRACT | Status: DC
  Filled 2024-02-09: qty 3

## 2024-02-09 MED ORDER — HYDRALAZINE HCL 20 MG/ML IJ SOLN
5.0000 mg | Freq: Three times a day (TID) | INTRAMUSCULAR | Status: DC | PRN
  Administered 2024-02-10: 5 mg via INTRAVENOUS
  Filled 2024-02-09: qty 1

## 2024-02-09 MED ORDER — INSULIN ASPART 100 UNIT/ML IJ SOLN
0.0000 [IU] | Freq: Three times a day (TID) | INTRAMUSCULAR | Status: DC
  Administered 2024-02-10: 1 [IU] via SUBCUTANEOUS
  Filled 2024-02-09: qty 0.09

## 2024-02-09 MED ORDER — ACETAMINOPHEN 650 MG RE SUPP: 650.0000 mg | Freq: Four times a day (QID) | RECTAL | Status: DC | PRN

## 2024-02-09 MED ORDER — ALBUTEROL SULFATE (2.5 MG/3ML) 0.083% IN NEBU
2.5000 mg | INHALATION_SOLUTION | Freq: Four times a day (QID) | RESPIRATORY_TRACT | Status: DC
  Administered 2024-02-10 (×2): 2.5 mg via RESPIRATORY_TRACT
  Filled 2024-02-09 (×2): qty 3

## 2024-02-09 MED ORDER — ACETAMINOPHEN 325 MG PO TABS: 650.0000 mg | ORAL_TABLET | Freq: Four times a day (QID) | ORAL | Status: DC | PRN

## 2024-02-09 NOTE — Assessment & Plan Note (Addendum)
 78 year old with history of mixed COPD with chronic respiratory failure on 3L oxygen  who presented to ED with complaints of worsening shortness of breath with exertion and hypoxia on exertion  -obs to tele -troponin, d-dimer and bnp wnl  -possible COPD exacerbation as EDP states she responded well to breathing treatments and is feeling better.  -no cough, increased sputum or change in viscosity. NO indication for antibiotics -will continue oral steroids, scheduled SABA (allergy to ipratropium), home inhalers, pulmonary toiletry, IS -checking echo to make sure no worsening pulmonary HTN playing a role  -? Worsening ILD as well  -CO2 elevated at baseline.  -discussed her hypoxia on exertion is not new per pulm note she was low and recovers with rest as she does here. She, however reports this is a new finding at home. Consider ambulatory challenge -consider pulm consult.

## 2024-02-09 NOTE — H&P (Addendum)
 History and Physical    Patient: Amy [name] FMW:[id number] DOB: 16-Sep-1945 DOA: 02/09/2024 DOS: the patient was seen and examined on 02/09/2024 PCP: Rexanne Ingle, MD  Patient coming from: Home - lives alone. Uses walker and cane to ambulate.    Chief Complaint: shortness of breath   HPI: Amy [name] is a 78 y.o. female with medical history significant of hx of breast cancer, hx of colon cancer, hx of renal cell CA, COPD, T2DM, ILD with chronic respiratory failure on 3L [place] , hx of NSCCA of right lung, pulmonary HTN who came to ED with shortness of breath. She states she started to feel short of breath.  She states she noticed it more with exertion. She continued to get dyspneic on exertion which was abnormal for her. She states she usually doesn't get this noticeably short of breat with exertion.  She does sit for the most of the day. She states her pulse ox would normally not drop with exertion so this was new for her to have it drop down to the 80s. She denies any wheezing or coughing. No fever/chills. No sick contacts.   Office note with pulmonology she arrived with walking and her oxygen  was 85% and rose to 90% with rest.    Denies any fever/chills, vision changes/headaches, chest pain or palpitations,  cough, abdominal pain, N/V/D, dysuria or leg swelling.   She does not smoke or drink alcohol.   ER Course:  vitals: afebrile, bp: 146/91, HR: 76, RR: 18, oxygen : 96%2L [place]  Pertinent labs: hgb: 10.1, CO2: 41,  CXR: no acute finding In ED: given albuterol  and oral steroids. TRH asked to admit.    Review of Systems: As mentioned in the history of present illness. All other systems reviewed and are negative. Past Medical History:  Diagnosis Date   Allergy    Anemia    Anxiety    Arthritis    Asthma exacerbation 11/03/2021   Blood dyscrasia    Sickle Cell traits   Blood transfusion without reported diagnosis 1999   prbc S/P COLON SURGERY   Breast cancer, right breast (HCC)  10/03/2012   Cancer (HCC)    colon,renal,lung   Cataract    NO SURGERY   Chronic kidney disease 1999   RIGHT KIDNEY REMOVED   Colon cancer (HCC) 1999, 2013   COPD (chronic obstructive pulmonary disease) (HCC)    Depression    Diabetes mellitus    diet controlled   GERD (gastroesophageal reflux disease)    H/O: lung cancer    Heart murmur    Hernia    Near umbilicus   History of colon cancer    History of kidney cancer    Hot flashes    5 yr pill for breast CA causing hot flashes   HX: breast cancer 2014   --right breast   Hypertension    Does not see a cardiologist   Internal hemorrhoids    Interstitial lung disease (HCC) 11/25/2015   Lung cancer (HCC) 2001   Neuromuscular disorder (HCC)    PERIPHERAL NEUROPATHY FEET   Shortness of breath    with exertion    Sickle cell trait (HCC)    Tubular adenoma of colon    Ulcer    HX YEARS AGO   Past Surgical History:  Procedure Laterality Date   ABDOMINAL HYSTERECTOMY  80's   fibroids   BIOPSY  07/02/2018   Procedure: BIOPSY;  Surgeon: Aneita Gwendlyn DASEN, MD;  Location: [facility];  Service: Endoscopy;;   BREAST BIOPSY     Bilateral cysts/benign   BREAST BIOPSY Right 10/03/12   ER/PR +   BREAST BIOPSY Right 12/20/2012   Procedure: Right breast Excisional Biopsy;  Surgeon: Donnice Bury, MD;  Location: [facility] OR;  Service: General;  Laterality: Right;   BREAST LUMPECTOMY     BREAST LUMPECTOMY WITH NEEDLE LOCALIZATION Right 12/20/2012   Procedure: BREAST LUMPECTOMY WITH NEEDLE LOCALIZATION;  Surgeon: Donnice Bury, MD;  Location[facility] OR;  Service: General;  Laterality: Right;   BREAST LUMPECTOMY WITH RADIOACTIVE SEED LOCALIZATION Right 01/19/2015   Procedure: RIGHT BREAST LUMPECTOMY WITH RADIOACTIVE SEED LOCALIZATION;  Surgeon: Donnice Bury, MD;  Location: [facility];  Service: General;  Laterality: Right;   CARDIAC CATHETERIZATION N/A 03/02/2016   Procedure: Right Heart Cath;  Surgeon: Toribio JONELLE Fuel,  MD;  Location: [facility];  Service: Cardiovascular;  Laterality: N/A;   CATARACT EXTRACTION, BILATERAL  01/2018 &02/2018   COLECTOMY  05-31-2012   laparoscopic ,possible open   COLON SURGERY     chemo   COLONOSCOPY     COLONOSCOPY WITH PROPOFOL  N/A 07/02/2018   Procedure: COLONOSCOPY WITH PROPOFOL ;  Surgeon: Aneita Gwendlyn DASEN, MD;  Location: [facility];  Service: Endoscopy;  Laterality: N/A;   KIDNEY SURGERY  1999   right/chemo kidney removed for cancer   LUNG LOBECTOMY     right lower   PARTIAL COLECTOMY  05/31/2012   Procedure: PARTIAL COLECTOMY;  Surgeon: Donnice Bury, MD;  Location: [facility];  Service: General;  Laterality: N/A;   PARTIAL HYSTERECTOMY     POLYPECTOMY     POLYPECTOMY  07/02/2018   Procedure: POLYPECTOMY;  Surgeon: Aneita Gwendlyn DASEN, MD;  Location: [facility];  Service: Endoscopy;;   RE-EXCISION OF BREAST CANCER,SUPERIOR MARGINS Right 01/21/2013   Procedure: RE-EXCISION OF BREAST CANCER,SUPERIOR MARGINS;  Surgeon: Donnice Bury, MD;  Location: [facility];  Service: General;  Laterality: Right;   TONSILLECTOMY     as child   TUBAL LIGATION     Social History:  reports that she quit smoking about 10 years ago. Her smoking use included cigarettes. She started smoking about 31 years ago. She has a 26.3 pack-year smoking history. She has never used smokeless tobacco. She reports that she does not drink alcohol and does not use drugs.  Allergies  Allergen Reactions   Crab [Shellfish Allergy] Anaphylaxis   Iodine Anaphylaxis   Albuterol -Ipratropium [Ipratropium-Albuterol ]     Tremors, blurred vision.    Incruse Ellipta  [Umeclidinium Bromide ] Other (See Comments)    Trembling, blurred vision   Iohexol      Code: HIVES, Desc: pt states , swelling mouth , throat, and has difficulty breathing    Lactose Intolerance (Gi)    Adhesive [Tape] Rash   Latex Itching and Rash    Family History  Problem Relation Age of Onset   Colon polyps Brother    Multiple  myeloma Brother 42   Breast cancer Mother        dx in her 74s   Heart disease Father    Colon cancer Paternal Aunt    Breast cancer Maternal Grandmother    Colon cancer Cousin        2 paternal first cousins   Leukemia Cousin 11   Esophageal cancer Neg Hx    Rectal cancer Neg Hx    Stomach cancer Neg Hx     Prior to Admission medications   Medication Sig Start Date End Date Taking? Authorizing Provider  acetaminophen  (TYLENOL ) 500 MG tablet Take 1,000 mg by mouth every 6 (six) hours as needed for moderate pain.    [provider]  Albuterol  Sulfate (PROAIR  RESPICLICK) 108 (90 Base) MCG/ACT AEPB Inhale 2 puffs every 6 hours as needed- rescue 11/06/23   Neysa Rama D, MD  Ascorbic Acid  (VITAMIN C  WITH ROSE HIPS) 500 MG tablet Take 500 mg by mouth daily.    [provider]  atorvastatin  (LIPITOR) 20 MG tablet Take 20 mg by mouth daily.  03/08/12   [provider]  budesonide -formoterol  (SYMBICORT ) 160-4.5 MCG/ACT inhaler Inhale 2 puffs then rinse mouth, twice daily 11/06/23   Neysa Rama D, MD  Calcium  Carbonate-Vitamin D  (CALCIUM -VITAMIN D ) 500-200 MG-UNIT per tablet Take 1 tablet by mouth every Monday, Wednesday, and Friday.     [provider]  Ferrous Gluconate  (IRON ) 240 (27 FE) MG TABS Take 1 tablet by mouth every morning.    [provider]  guaiFENesin  (MUCINEX ) 600 MG 12 hr tablet Take 1 tablet (600 mg total) by mouth 2 (two) times daily. 11/07/21   Swayze, Ava, DO  hydrochlorothiazide  25 MG tablet Take 25 mg by mouth daily before breakfast.  01/03/11   [provider]  losartan  (COZAAR ) 50 MG tablet Take 50 mg by mouth daily.    [provider]  metFORMIN  (GLUCOPHAGE ) 500 MG tablet Take 500 mg by mouth daily with breakfast.  12/26/12   [provider]  metoprolol  (LOPRESSOR ) 100 MG tablet Take 100 mg by mouth daily before breakfast.  01/03/11   [provider]  Multiple Vitamin (MULTIVITAMIN WITH  MINERALS) TABS tablet Take 1 tablet by mouth daily. 11/08/21   Swayze, Ava, DO  omeprazole (PRILOSEC) 20 MG capsule Take 20 mg by mouth daily before breakfast.    [provider]  PARoxetine  (PAXIL ) 40 MG tablet Take 40 mg by mouth daily before breakfast.  12/22/10   [provider]  traMADol  (ULTRAM ) 50 MG tablet Take 50 mg by mouth every 12 (twelve) hours as needed for severe pain. 12/27/10   [provider]    Physical Exam: Vitals:   02/09/24 1830 02/09/24 1900 02/09/24 1959 02/09/24 2024  BP: (!) 142/79   (!) 156/86  Pulse: 79 82 85 82  Resp: 17   18  Temp:    98.4 F (36.9 C)  TempSrc:    Oral  SpO2: 98% 100% 97% 93%   General:  Appears calm and comfortable and is in NAD Eyes:  PERRL, EOMI, normal lids, iris ENT:  grossly normal hearing, lips & tongue, mmm;poor dentition Neck:  no LAD, masses or thyromegaly; no carotid bruits Cardiovascular:  RRR, no m/r/g. LE edema in ankles only  Respiratory:   decreased breath sounds throughout, but clear. no wheezes/rales/rhonchi.  Normal respiratory effort. Abdomen:  soft, NT, ND, NABS Back:   normal alignment, no CVAT Skin:  no rash or induration seen on limited exam Musculoskeletal:  grossly normal tone BUE/BLE, good ROM, no bony abnormality Lower extremity:  bilateral ankle edema.  Limited foot exam with no ulcerations.  2+ distal pulses. Psychiatric:  grossly normal mood and affect, speech fluent and appropriate, AOx3 Neurologic:  CN 2-12 grossly intact, moves all extremities in coordinated fashion, sensation intact   Radiological Exams on Admission: Independently reviewed - see discussion in A/P where applicable  DG Chest 2 View Result Date: 02/09/2024 CLINICAL DATA:  Shortness of breath. EXAM: CHEST - 2 VIEW COMPARISON:  11/06/2023. FINDINGS: Moderate elevation of right hemidiaphragm again  seen. Bilateral lung fields are clear. Bilateral costophrenic angles are clear. Normal cardio-mediastinal silhouette. No  acute osseous abnormalities. The soft tissues are within normal limits. IMPRESSION: No active cardiopulmonary disease. Electronically Signed   By: Ree Molt M.D.   On: 02/09/2024 15:12    EKG: Independently reviewed.  NSR with rate 78; nonspecific ST changes with no evidence of acute ischemia   Labs on Admission: I have personally reviewed the available labs and imaging studies at the time of the admission.  Pertinent labs:   hgb: 10.1,  CO2: 41,  Assessment and Plan: Principal Problem:   COPD with acute exacerbation (HCC) Active Problems:   Interstitial lung disease with chronic respiratory failure on 3L oxygen    Normocytic anemia   Pulmonary hypertension (HCC)   DM (diabetes mellitus), type 2 (HCC)   GERD (gastroesophageal reflux disease)   HLD (hyperlipidemia)   Non-small cell cancer of right lung (HCC)   Breast cancer of upper-outer quadrant of right female breast (HCC)   Colon cancer (HCC)   Chronic respiratory failure with hypoxia (HCC)    Assessment and Plan: * COPD with acute exacerbation (HCC) 78 year old with history of mixed COPD with chronic respiratory failure on 3L oxygen  who presented to ED with complaints of worsening shortness of breath with exertion and hypoxia on exertion  -obs to tele -troponin, d-dimer and bnp wnl  -possible COPD exacerbation as EDP states she responded well to breathing treatments and is feeling better.  -no cough, increased sputum or change in viscosity. NO indication for antibiotics -will continue oral steroids, scheduled SABA (allergy to ipratropium), home inhalers, pulmonary toiletry, IS -checking echo to make sure no worsening pulmonary HTN playing a role  -? Worsening ILD as well  -CO2 elevated at baseline.  -discussed her hypoxia on exertion is not new per pulm note she was low and recovers with rest as she does here. She, however reports this is a new finding at home. Consider ambulatory challenge -consider pulm consult.    Normocytic anemia Hgb of 10.1, baseline around 11 Check iron  studies ? On daily iron , f/u on med rec   Interstitial lung disease with chronic respiratory failure on 3L oxygen  -followed by pulmonology  -continue inhalers/oxygen   -see #1  Pulmonary hypertension (HCC) Check echo [facility] in 2017: 1. Mild pulmonary HTN with normal PVR 2. Normal left-sided pressures Echo in 2017: normal EF with grade 1 DD. Pulmonary arteries with moderately increased pressures   DM (diabetes mellitus), type 2 (HCC) Remote A1C of 5.9, repeat pending Hold metformin  Sensitive SSI and accuchecks QAC/HS   GERD (gastroesophageal reflux disease) Continue PPI   HLD (hyperlipidemia) Continue lipitor 20mg  daily   Non-small cell cancer of right lung [facility]) S/p right VATS and right superior segmentectomy for T1NO in 2001  Breast cancer of upper-outer quadrant of right female breast [facility]) She was diagnosed with ductal carcinoma in situ (DCIS) in 2014 and completed five years of treatment with medication until 2019  -mmg 08/2023: normal   Colon cancer (HCC) Hx of colon cancer 05/2012 Right: Segmental resection: 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 Observation status is judged to  be reasonable and necessary in order to provide the required intensity of service to ensure the patient's safety. The patient's presenting symptoms, physical exam findings, and initial radiographic and laboratory data in the context of their medical condition is felt to place them at decreased risk for further clinical deterioration. Furthermore, it is anticipated that the patient  will be medically stable for discharge from the hospital within 2 midnights of admission.   Author: Isaiah Geralds, MD 02/09/2024 10:33 PM  For on call review [email or web address].

## 2024-02-09 NOTE — Assessment & Plan Note (Signed)
 She was diagnosed with ductal carcinoma in situ (DCIS) in 2014 and completed five years of treatment with medication until 2019  -mmg 08/2023: normal

## 2024-02-09 NOTE — ED Notes (Signed)
 Pt ambulated a small distance and O2 sat dropped to 87%-89% on 2 lpm. 100% while resting.

## 2024-02-09 NOTE — Assessment & Plan Note (Signed)
 Continue PPI.

## 2024-02-09 NOTE — Assessment & Plan Note (Signed)
 Remote A1C of 5.9, repeat pending Hold metformin  Sensitive SSI and accuchecks QAC/HS

## 2024-02-09 NOTE — Assessment & Plan Note (Signed)
 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

## 2024-02-09 NOTE — ED Triage Notes (Signed)
 BIBA from home for [facility] on exertion for one week. Pt has a hx of COPD on 2 lpm at baseline. 98% on 2 lpm

## 2024-02-09 NOTE — ED Notes (Signed)
 Patient transported to x-ray. ?

## 2024-02-09 NOTE — Assessment & Plan Note (Signed)
 S/p right VATS and right superior segmentectomy for T1NO in 2001

## 2024-02-09 NOTE — ED Provider Notes (Signed)
 [place] EMERGENCY DEPARTMENT AT [facility] Provider Note   CSN: [id number] Arrival date & time: 02/09/24  1342     Patient presents with: Shortness of Breath   Amy [name] is a 78 y.o. female.   Patient is a 78 year old female with a history of COPD on chronic oxygen  at 2 to 3 L/min, pulmonary hypertension, hypertension, prior history of breast cancer, renal cell carcinoma and non-squamous cell carcinoma of the lung, interstitial lung disease, diabetes.  She states she has had some shortness of breath over the last couple days.       Prior to Admission medications   Medication Sig Start Date End Date Taking? Authorizing Provider  acetaminophen  (TYLENOL ) 500 MG tablet Take 1,000 mg by mouth every 6 (six) hours as needed for moderate pain.    [provider]  Albuterol  Sulfate (PROAIR  RESPICLICK) 108 (90 Base) MCG/ACT AEPB Inhale 2 puffs every 6 hours as needed- rescue 11/06/23   Neysa Rama D, MD  Ascorbic Acid  (VITAMIN C  WITH ROSE HIPS) 500 MG tablet Take 500 mg by mouth daily.    [provider]  atorvastatin  (LIPITOR) 20 MG tablet Take 20 mg by mouth daily.  03/08/12   [provider]  budesonide -formoterol  (SYMBICORT ) 160-4.5 MCG/ACT inhaler Inhale 2 puffs then rinse mouth, twice daily 11/06/23   Neysa Rama D, MD  Calcium  Carbonate-Vitamin D  (CALCIUM -VITAMIN D ) 500-200 MG-UNIT per tablet Take 1 tablet by mouth every Monday, Wednesday, and Friday.     [provider]  Ferrous Gluconate  (IRON ) 240 (27 FE) MG TABS Take 1 tablet by mouth every morning.    [provider]  guaiFENesin  (MUCINEX ) 600 MG 12 hr tablet Take 1 tablet (600 mg total) by mouth 2 (two) times daily. 11/07/21   Swayze, Ava, DO  hydrochlorothiazide  25 MG tablet Take 25 mg by mouth daily before breakfast.  01/03/11   [provider]  losartan  (COZAAR ) 50 MG tablet Take 50 mg by mouth daily.    [provider]  metFORMIN  (GLUCOPHAGE ) 500  MG tablet Take 500 mg by mouth daily with breakfast.  12/26/12   [provider]  metoprolol  (LOPRESSOR ) 100 MG tablet Take 100 mg by mouth daily before breakfast.  01/03/11   [provider]  Multiple Vitamin (MULTIVITAMIN WITH MINERALS) TABS tablet Take 1 tablet by mouth daily. 11/08/21   Swayze, Ava, DO  omeprazole (PRILOSEC) 20 MG capsule Take 20 mg by mouth daily before breakfast.    [provider]  PARoxetine  (PAXIL ) 40 MG tablet Take 40 mg by mouth daily before breakfast.  12/22/10   [provider]  traMADol  (ULTRAM ) 50 MG tablet Take 50 mg by mouth every 12 (twelve) hours as needed for severe pain. 12/27/10   [provider]    Allergies: Georgianne cerise allergy], Iodine, Albuterol -ipratropium [ipratropium-albuterol ], Incruse ellipta  [umeclidinium bromide ], Iohexol, Lactose intolerance (gi), Adhesive [tape], and Latex    Review of Systems  Updated Vital Signs BP (!) 172/94 (BP Location: Left Arm)   Pulse 74   Temp 98.4 F (36.9 C) (Oral)   Resp 18   LMP 07/26/1983   SpO2 98%   Physical Exam  (all labs ordered are listed, but only abnormal results are displayed) Labs Reviewed  BASIC METABOLIC PANEL WITH GFR - Abnormal; Notable for the following components:      Result Value   Chloride 88 (*)    CO2 41 (*)    Glucose, Bld 136 (*)    All  other components within normal limits  CBC - Abnormal; Notable for the following components:   RBC 3.45 (*)    Hemoglobin 10.1 (*)    HCT 32.2 (*)    All other components within normal limits  D-DIMER, QUANTITATIVE  TROPONIN I (HIGH SENSITIVITY)    EKG: EKG Interpretation Date/Time:  Friday February 09 2024 13:50:48 EDT Ventricular Rate:  78 PR Interval:  194 QRS Duration:  82 QT Interval:  364 QTC Calculation: 415 R Axis:   43  Text Interpretation: Sinus rhythm Anteroseptal infarct, age indeterminate Confirmed by Zackowski, Scott [phone]) on 02/09/2024 2:04:16 PM  Radiology: DG Chest 2  View Result Date: 02/09/2024 CLINICAL DATA:  Shortness of breath. EXAM: CHEST - 2 VIEW COMPARISON:  11/06/2023. FINDINGS: Moderate elevation of right hemidiaphragm again seen. Bilateral lung fields are clear. Bilateral costophrenic angles are clear. Normal cardio-mediastinal silhouette. No acute osseous abnormalities. The soft tissues are within normal limits. IMPRESSION: No active cardiopulmonary disease. Electronically Signed   By: Ree Molt M.D.   On: 02/09/2024 15:12     Procedures   Medications Ordered in the ED  albuterol  (PROVENTIL ) (2.5 MG/3ML) 0.083% nebulizer solution 5 mg (5 mg Nebulization Given 02/09/24 1535)  predniSONE  (DELTASONE ) tablet 40 mg (40 mg Oral Given 02/09/24 1535)  albuterol  (PROVENTIL ) (2.5 MG/3ML) 0.083% nebulizer solution 5 mg (5 mg Nebulization Given 02/09/24 1836)                                    Medical Decision Making Amount and/or Complexity of Data Reviewed Labs: ordered. Radiology: ordered.  Risk Prescription drug management. Decision regarding hospitalization.   This patient presents to the ED for concern of shortness of breath, this involves an extensive number of treatment options, and is a complaint that carries with it a high risk of complications and morbidity.  I considered the following differential and admission for this acute, potentially life threatening condition.  The differential diagnosis includes COPD exacerbation, ACS, pneumonia, PE, CHF, pneumothorax  MDM:    Patient is a 78 year old who presents with shortness of breath that started about 2 or 3 days ago.  She is on baseline oxygen .  She is not hypoxic at rest on her baseline oxygen .  Her lungs are diminished bilaterally but I do not hear any obvious wheezing.  She was given nebulizer treatments and does seem to have improvement after this.  She does not have clinical suggestions of fluid overload.  Chest x-ray does not show any evidence of pneumonia.  Her D-dimer is normal and  she does not have other symptoms that would be more concerning for PE.  Her EKG does not show any ischemic changes.  Troponin is negative.  Suspect this is a COPD exacerbation.  Overall is feeling better.  Discussed possible discharge with her.  She was given prednisone  and albuterol  treatments.  She still says she feels short of breath.  I ambulated her and she did desat to 87 to 89% on her oxygen  but she did feel like she was more short of breath just with a few steps of ambulation.  Given this, I did consult with Dr. Waddell who will kindly admit her for observation.  (Labs, imaging, consults)  Labs: I Ordered, and personally interpreted labs.  The pertinent results include: Normal D-dimer, normal troponin  Imaging Studies ordered: I ordered imaging studies including chest x-ray I independently visualized and interpreted imaging. I agree  with the radiologist interpretation  Additional history obtained from heart.  External records from outside source obtained and reviewed including prior pulmonology notes  Cardiac Monitoring: The patient was maintained on a cardiac monitor.  If on the cardiac monitor, I personally viewed and interpreted the cardiac monitored which showed an underlying rhythm of: Sinus rhythm  Reevaluation: After the interventions noted above, I reevaluated the patient and found that they have :improved  Social Determinants of Health:    Disposition: Admit to hospital  Co morbidities that complicate the patient evaluation  Past Medical History:  Diagnosis Date   Allergy    Anemia    Anxiety    Arthritis    Asthma exacerbation 11/03/2021   Blood dyscrasia    Sickle Cell traits   Blood transfusion without reported diagnosis 1999   prbc S/P COLON SURGERY   Breast cancer, right breast (HCC) 10/03/2012   Cancer (HCC)    colon,renal,lung   Cataract    NO SURGERY   Chronic kidney disease 1999   RIGHT KIDNEY REMOVED   Colon cancer (HCC) 1999, 2013   COPD  (chronic obstructive pulmonary disease) (HCC)    Depression    Diabetes mellitus    diet controlled   GERD (gastroesophageal reflux disease)    H/O: lung cancer    Heart murmur    Hernia    Near umbilicus   History of colon cancer    History of kidney cancer    Hot flashes    5 yr pill for breast CA causing hot flashes   HX: breast cancer 2014   --right breast   Hypertension    Does not see a cardiologist   Internal hemorrhoids    Interstitial lung disease (HCC) 11/25/2015   Lung cancer (HCC) 2001   Neuromuscular disorder (HCC)    PERIPHERAL NEUROPATHY FEET   Shortness of breath    with exertion    Sickle cell trait (HCC)    Tubular adenoma of colon    Ulcer    HX YEARS AGO     Medicines Meds ordered this encounter  Medications   albuterol  (PROVENTIL ) (2.5 MG/3ML) 0.083% nebulizer solution 5 mg   predniSONE  (DELTASONE ) tablet 40 mg   albuterol  (PROVENTIL ) (2.5 MG/3ML) 0.083% nebulizer solution 5 mg    I have reviewed the patients home medicines and have made adjustments as needed  Problem List / ED Course: Problem List Items Addressed This Visit   None Visit Diagnoses       COPD exacerbation (HCC)    -  Primary   Relevant Medications   albuterol  (PROVENTIL ) (2.5 MG/3ML) 0.083% nebulizer solution 5 mg (Completed)   predniSONE  (DELTASONE ) tablet 40 mg (Completed)   albuterol  (PROVENTIL ) (2.5 MG/3ML) 0.083% nebulizer solution 5 mg (Completed)                Final diagnoses:  COPD exacerbation [facility])    ED Discharge Orders     None          Lenor Hollering, MD 02/09/24 1904

## 2024-02-09 NOTE — Assessment & Plan Note (Signed)
Continue lipitor 20mg daily  

## 2024-02-09 NOTE — Assessment & Plan Note (Signed)
-  followed by pulmonology  -continue inhalers/oxygen   -see #1

## 2024-02-09 NOTE — Assessment & Plan Note (Signed)
 Hgb of 10.1, baseline around 11 Check iron  studies ? On daily iron , f/u on med rec

## 2024-02-09 NOTE — Assessment & Plan Note (Signed)
 Check echo [facility] in 2017: 1. Mild pulmonary HTN with normal PVR 2. Normal left-sided pressures Echo in 2017: normal EF with grade 1 DD. Pulmonary arteries with moderately increased pressures

## 2024-02-10 ENCOUNTER — Observation Stay (HOSPITAL_BASED_OUTPATIENT_CLINIC_OR_DEPARTMENT_OTHER)

## 2024-02-10 ENCOUNTER — Other Ambulatory Visit (HOSPITAL_COMMUNITY)

## 2024-02-10 DIAGNOSIS — I272 Pulmonary hypertension, unspecified: Secondary | ICD-10-CM | POA: Diagnosis not present

## 2024-02-10 DIAGNOSIS — J441 Chronic obstructive pulmonary disease with (acute) exacerbation: Secondary | ICD-10-CM | POA: Diagnosis not present

## 2024-02-10 LAB — ECHOCARDIOGRAM COMPLETE
Area-P 1/2: 3.6 cm2
S' Lateral: 2.7 cm

## 2024-02-10 LAB — GLUCOSE, CAPILLARY
Glucose-Capillary: 104 mg/dL — ABNORMAL HIGH (ref 70–99)
Glucose-Capillary: 124 mg/dL — ABNORMAL HIGH (ref 70–99)

## 2024-02-10 MED ORDER — MELATONIN 5 MG PO TABS
5.0000 mg | ORAL_TABLET | Freq: Every evening | ORAL | Status: DC | PRN
  Administered 2024-02-10: 5 mg via ORAL
  Filled 2024-02-10: qty 1

## 2024-02-10 MED ORDER — ATORVASTATIN CALCIUM 10 MG PO TABS
20.0000 mg | ORAL_TABLET | Freq: Every day | ORAL | Status: DC
  Administered 2024-02-10: 20 mg via ORAL
  Filled 2024-02-10: qty 2

## 2024-02-10 MED ORDER — FERROUS GLUCONATE 324 (38 FE) MG PO TABS: 324.0000 mg | ORAL_TABLET | Freq: Every day | ORAL | Status: DC

## 2024-02-10 MED ORDER — ACETAMINOPHEN 500 MG PO TABS: 1000.0000 mg | ORAL_TABLET | Freq: Four times a day (QID) | ORAL | Status: DC | PRN

## 2024-02-10 MED ORDER — TRAMADOL HCL 50 MG PO TABS: 50.0000 mg | ORAL_TABLET | Freq: Two times a day (BID) | ORAL | Status: DC | PRN

## 2024-02-10 MED ORDER — METOPROLOL SUCCINATE ER 50 MG PO TB24
100.0000 mg | ORAL_TABLET | Freq: Every day | ORAL | Status: DC
  Administered 2024-02-10: 100 mg via ORAL
  Filled 2024-02-10: qty 2

## 2024-02-10 MED ORDER — PAROXETINE HCL 20 MG PO TABS: 40.0000 mg | ORAL_TABLET | Freq: Every day | ORAL | Status: DC

## 2024-02-10 MED ORDER — PANTOPRAZOLE SODIUM 40 MG PO TBEC
40.0000 mg | DELAYED_RELEASE_TABLET | Freq: Every day | ORAL | Status: DC
  Administered 2024-02-10: 40 mg via ORAL
  Filled 2024-02-10: qty 1

## 2024-02-10 MED ORDER — LOSARTAN POTASSIUM 50 MG PO TABS
50.0000 mg | ORAL_TABLET | Freq: Every day | ORAL | Status: DC
  Administered 2024-02-10: 50 mg via ORAL
  Filled 2024-02-10: qty 1

## 2024-02-10 MED ORDER — PREDNISONE 10 MG PO TABS: ORAL_TABLET | ORAL | 0 refills | Status: AC

## 2024-02-10 NOTE — Evaluation (Signed)
 Physical Therapy Evaluation Patient Details Name: Amy [name] MRN: [id number] DOB: [birth year] Today's Date: 02/10/2024  History of Present Illness  Pt is a 78 y.o. female who came to ED with shortness of breath and admitted 02/09/24 for COPD with acute exacerbation. Past medical history significant of hx of breast cancer, hx of colon cancer, hx of renal cell CA, COPD, T2DM, ILD with chronic respiratory failure on 3L [place] , hx of NSCCA of right lung, pulmonary HTN  Clinical Impression  Pt admitted with above diagnosis.  Pt currently with functional limitations due to the deficits listed below (see PT Problem List). Pt will benefit from acute skilled PT to increase their independence and safety with mobility to allow discharge.  Pt ambulated to/from bathroom and then in hallway.  Pt performing well with RW (typically uses only outside of home for safety).  Pt ambulated on baseline 3L O2 [place] and SPO2 91-93% during ambulation.  Pt reports dyspnea improved since admission however still remains at least mild with exertion.         If plan is discharge home, recommend the following:     Can travel by private vehicle        Equipment Recommendations None recommended by PT  Recommendations for Other Services       Functional Status Assessment Patient has had a recent decline in their functional status and demonstrates the ability to make significant improvements in function in a reasonable and predictable amount of time.     Precautions / Restrictions Precautions Precautions: Fall Precaution/Restrictions Comments: 3L O2 baseline      Mobility  Bed Mobility Overal bed mobility: Modified Independent             General bed mobility comments: sitting EOB on arrival to room    Transfers Overall transfer level: Needs assistance Equipment used: None Transfers: Sit to/from Stand Sit to Stand: Supervision           General transfer comment: supervision for safety     Ambulation/Gait Ambulation/Gait assistance: Contact guard assist, Supervision Gait Distance (Feet): 120 Feet Assistive device: Rolling walker (2 wheels) Gait Pattern/deviations: Step-through pattern, Decreased stride length, Trunk flexed       General Gait Details: verbal cues for safe use of RW; pt ambulated first to bathroom and attempting to leave RW behind (furniture walks at home apparently); utilized RW in hallway, no unsteadiness or LOB observed, remained on baseline 3L O2  and SPO2 91-93% during ambulation  Stairs            Wheelchair Mobility     Tilt Bed    Modified Rankin (Stroke Patients Only)       Balance Overall balance assessment: History of Falls                                           Pertinent Vitals/Pain Pain Assessment Pain Assessment: No/denies pain    Home Living Family/patient expects to be discharged to:: Private residence Living Arrangements: Alone Available Help at Discharge: Friend(s);Family;Available PRN/intermittently Type of Home: House Home Access: Level entry       Home Layout: One level Home Equipment: Agricultural consultant (2 wheels);Cane - single point      Prior Function Prior Level of Function : Independent/Modified Independent             Mobility Comments: mostly stays at home, uses RW  if in community, furniture walks in home       Extremity/Trunk Assessment        Lower Extremity Assessment Lower Extremity Assessment: Overall [facility] for tasks assessed       Communication   Communication Communication: No apparent difficulties    Cognition Arousal: Alert Behavior During Therapy: WFL for tasks assessed/performed   PT - Cognitive impairments: No apparent impairments                         Following commands: Intact       Cueing       General Comments      Exercises     Assessment/Plan    PT Assessment Patient needs continued PT services  PT Problem List  Decreased mobility;Decreased activity tolerance;Decreased knowledge of use of DME;Cardiopulmonary status limiting activity;Decreased balance       PT Treatment Interventions Gait training;DME instruction;Balance training;Neuromuscular re-education;Patient/family education;Therapeutic activities;Therapeutic exercise;Functional mobility training    PT Goals (Current goals can be found in the Care Plan section)  Acute Rehab PT Goals PT Goal Formulation: With patient Time For Goal Achievement: 02/24/24 Potential to Achieve Goals: Good    Frequency Min 2X/week     Co-evaluation               AM-PAC PT 6 Clicks Mobility  Outcome Measure Help needed turning from your back to your side while in a flat bed without using bedrails?: A Little Help needed moving from lying on your back to sitting on the side of a flat bed without using bedrails?: A Little Help needed moving to and from a bed to a chair (including a wheelchair)?: A Little Help needed standing up from a chair using your arms (e.g., wheelchair or bedside chair)?: A Little Help needed to walk in hospital room?: A Little Help needed climbing 3-5 steps with a railing? : A Little 6 Click Score: 18    End of Session Equipment Utilized During Treatment: Gait belt Activity Tolerance: Patient tolerated treatment well Patient left: in chair;with call bell/phone within reach;with chair alarm set Nurse Communication: Mobility status PT Visit Diagnosis: Difficulty in walking, not elsewhere classified (R26.2)    Time: 8853-8797 PT Time Calculation (min) (ACUTE ONLY): 16 min   Charges:   PT Evaluation $PT Eval Low Complexity: 1 Low   PT General Charges $$ ACUTE PT VISIT: 1 Visit        Tari KLEIN, DPT Physical Therapist Acute Rehabilitation Services Office: [phone]   Tari CROME [place] 02/10/2024, 12:47 PM

## 2024-02-10 NOTE — TOC Transition Note (Signed)
 Transition of Care [facility]) - Discharge Note   Patient Details  Name: Amy [name] MRN: [id number] Date of Birth: 1946/01/22  Transition of Care [facility]) CM/SW Contact:  Heather DELENA Saltness, LCSW Phone Number: 02/10/2024, 3:03 PM   Clinical Narrative:    CSW met with pt at bedside to discuss discharge planning. Pt reports she is on O2 at home, but does not have travel tank here. Pt's O2 supplied through [employer]. CSW placed order for travel tank with Jermaine at [employer]. CSW advised pt of PT's recommendation for [facility] PT upon discharge. CSW set up [facility] PT services through [place]. No further TOC needs at this time.   Final next level of care: Home w Home Health Services Barriers to Discharge: Barriers Resolved   Patient Goals and CMS Choice Patient states their goals for this hospitalization and ongoing recovery are:: To return home        Discharge Placement  Home with [facility] services              Patient to be transferred to facility by: Friend Name of family member notified: Patient Patient and family notified of of transfer: 02/10/24  Discharge Plan and Services Additional resources added to the After Visit Summary for  [facility] services with [place]                DME Arranged: Oxygen  DME Agency: [employer] Date DME Agency Contacted: 02/10/24 Time DME Agency Contacted: 1502 Representative spoke with at DME Agency: [place] HH Arranged: PT   Date [facility] Agency Contacted: 02/10/24 Time HH Agency Contacted: 1503    Social Drivers of Health (SDOH) Interventions SDOH Screenings   Food Insecurity: No Food Insecurity (02/09/2024)  Housing: Low Risk  (02/09/2024)  Transportation Needs: No Transportation Needs (02/09/2024)  Utilities: Not At Risk (02/09/2024)  Depression (PHQ2-9): Medium Risk (02/22/2021)  Tobacco Use: Medium Risk (02/09/2024)     Readmission Risk Interventions     No data to display           Heather Saltness, MSW, LCSW 02/10/2024 3:25 PM

## 2024-02-10 NOTE — Care Management Obs Status (Signed)
 MEDICARE OBSERVATION STATUS NOTIFICATION   Patient Details  Name: Amy [name] MRN: [id number] Date of Birth: 28-Mar-1946   Medicare Observation Status Notification Given:   Yes    Kayveon Lennartz A Cris Talavera, LCSW 02/10/2024, 2:45 PM

## 2024-02-10 NOTE — Plan of Care (Signed)

## 2024-02-10 NOTE — Progress Notes (Signed)
 Echocardiogram 2D Echocardiogram has been performed.  Koleen KANDICE Popper, RDCS 02/10/2024, 11:39 AM

## 2024-02-10 NOTE — Discharge Summary (Signed)
 Physician Discharge Summary  Amy [name] FMW:[id number] DOB: 1946-07-11 DOA: 02/09/2024  PCP: Rexanne Ingle, MD  Admit date: 02/09/2024 Discharge date: 02/10/2024   Discharge Diagnoses:   Principal Problem:   COPD with acute exacerbation (HCC) Active Problems:   Interstitial lung disease with chronic respiratory failure on 3L oxygen    Normocytic anemia   Pulmonary hypertension (HCC)   DM (diabetes mellitus), type 2 (HCC)   GERD (gastroesophageal reflux disease)   HLD (hyperlipidemia)   Non-small cell cancer of right lung (HCC)   Breast cancer of upper-outer quadrant of right female breast (HCC)   Colon cancer (HCC)   Chronic respiratory failure with hypoxia [facility])     Hospital Course:  This is a 78 year old female with a history of breast cancer, colon cancer, lung cancer, renal cell cancer, COPD, interstitial lung disease, chronic respiratory failure on 3 L of oxygen  at baseline who presents to the hospital for shortness of breath.  She was found to be hypoxic down to 85% when she ambulated and was felt to have a COPD exacerbation and therefore admitted to the hospital.  Principal Problem: COPD with acute exacerbation  Interstitial lung disease  Cronic respiratory failure on 3L oxygen  History of lung cancer status post lobectomy - Improved with steroids, inhalers and nebulizer treatments - Able to ambulate today without hypoxia-can continue 3 L of oxygen   Active Problems:   Pulmonary hypertension -2D echo completed today-she has moderate pulmonary hypertension (likely due to underlying lung disease) and grade 1 diastolic dysfunction  Diabetes mellitus type 2 - Continue metformin      Discharge Instructions   Allergies as of 02/10/2024       Reactions   Crab [shellfish Allergy] Anaphylaxis   Iodine Anaphylaxis   Albuterol -ipratropium [ipratropium-albuterol ]    Tremors, blurred vision.    Incruse Ellipta  [umeclidinium Bromide ] Other (See Comments)   Trembling,  blurred vision   Iohexol     Code: HIVES, Desc: pt states , swelling mouth , throat, and has difficulty breathing   Lactose Intolerance (gi)    Adhesive [tape] Rash   Latex Itching, Rash        Medication List     TAKE these medications    acetaminophen  500 MG tablet Commonly known as: TYLENOL  Take 1,000 mg by mouth every 6 (six) hours as needed for moderate pain.   atorvastatin  20 MG tablet Commonly known as: LIPITOR Take 20 mg by mouth daily.   budesonide -formoterol  160-4.5 MCG/ACT inhaler Commonly known as: SYMBICORT  Inhale 2 puffs then rinse mouth, twice daily   calcium -vitamin D  500-200 MG-UNIT tablet Take 1 tablet by mouth every Monday, Wednesday, and Friday.   Dry Mouth Drops Lozg Use as directed 1 lozenge in the mouth or throat as needed.   guaiFENesin  600 MG 12 hr tablet Commonly known as: MUCINEX  Take 1 tablet (600 mg total) by mouth 2 (two) times daily.   hydrochlorothiazide  25 MG tablet Commonly known as: HYDRODIURIL  Take 25 mg by mouth daily before breakfast.   Iron  240 (27 Fe) MG Tabs Take 1 tablet by mouth every morning.   losartan  50 MG tablet Commonly known as: COZAAR  Take 50 mg by mouth daily.   MELATIN PO Take 1 tablet by mouth as needed.   metFORMIN  500 MG tablet Commonly known as: GLUCOPHAGE  Take 500 mg by mouth daily with breakfast.   metoprolol  succinate 100 MG 24 hr tablet Commonly known as: TOPROL -XL Take 100 mg by mouth daily.   multivitamin with minerals Tabs tablet Take 1  tablet by mouth daily.   omeprazole 20 MG capsule Commonly known as: PRILOSEC Take 20 mg by mouth daily before breakfast.   PARoxetine  40 MG tablet Commonly known as: PAXIL  Take 40 mg by mouth daily before breakfast.   predniSONE  10 MG tablet Commonly known as: DELTASONE  Take 4 tablets (40 mg total) by mouth daily for 1 day, THEN 3 tablets (30 mg total) daily for 1 day, THEN 2 tablets (20 mg total) daily for 1 day, THEN 1 tablet (10 mg total) daily  for 1 day. Start taking on: February 10, 2024   ProAir  RespiClick 108 (90 Base) MCG/ACT Aepb Generic drug: Albuterol  Sulfate Inhale 2 puffs every 6 hours as needed- rescue   traMADol  50 MG tablet Commonly known as: ULTRAM  Take 50 mg by mouth every 12 (twelve) hours as needed for severe pain.   vitamin C  with rose hips 500 MG tablet Take 500 mg by mouth daily.            The results of significant diagnostics from this hospitalization (including imaging, microbiology, ancillary and laboratory) are listed below for reference.    ECHOCARDIOGRAM COMPLETE Result Date: 02/10/2024    ECHOCARDIOGRAM REPORT   Patient Name:   Amy [name] Date of Exam: 02/10/2024 Medical Rec #:  [id number]     Height:       65.0 in Accession #:    [id number]    Weight:       195.4 lb Date of Birth:  06-08-1946     BSA:          1.959 m Patient Age:    78 years      BP:           185/95 mmHg Patient Gender: F             HR:           72 bpm. Exam Location:  Inpatient Procedure: 2D Echo, 3D Echo, Strain Analysis, Cardiac Doppler and Color Doppler            (Both Spectral and Color Flow Doppler were utilized during            procedure). Indications:    Pulmonary Hypertension I27.2  History:        Patient has prior history of Echocardiogram examinations, most                 recent 02/16/2016. CAD, COPD; Risk Factors:Sleep Apnea,                 Hypertension, Diabetes, Dyslipidemia and Former Smoker.  Sonographer:    Koleen Popper RDCS Referring Phys: [phone] Aquilla Shambley  Sonographer Comments: Global longitudinal strain was attempted. IMPRESSIONS  1. Left ventricular ejection fraction, by estimation, is 55 to 60%. Left ventricular ejection fraction by 3D volume is 54 %. The left ventricle has normal function. The left ventricle has no regional wall motion abnormalities. There is mild concentric left ventricular hypertrophy. Left ventricular diastolic parameters are consistent with Grade I diastolic dysfunction (impaired  relaxation). The average left ventricular global longitudinal strain is -17.7 %. The global longitudinal strain is normal.  2. Right ventricular systolic function is normal. The right ventricular size is normal. There is moderately elevated pulmonary artery systolic pressure. The estimated right ventricular systolic pressure is 45.7 mmHg.  3. The mitral valve is normal in structure. No evidence of mitral valve regurgitation. No evidence of mitral stenosis.  4. The aortic valve is tricuspid. Aortic valve  regurgitation is not visualized. No aortic stenosis is present.  5. The inferior vena cava is dilated in size with >50% respiratory variability, suggesting right atrial pressure of 8 mmHg. Comparison(s): Prior images unable to be directly viewed, comparison made by report only. FINDINGS  Left Ventricle: Left ventricular ejection fraction, by estimation, is 55 to 60%. Left ventricular ejection fraction by 3D volume is 54 %. The left ventricle has normal function. The left ventricle has no regional wall motion abnormalities. The average left ventricular global longitudinal strain is -17.7 %. Strain was performed and the global longitudinal strain is normal. The left ventricular internal cavity size was normal in size. There is mild concentric left ventricular hypertrophy. Left ventricular diastolic parameters are consistent with Grade I diastolic dysfunction (impaired relaxation). Normal left ventricular filling pressure. Right Ventricle: The right ventricular size is normal. No increase in right ventricular wall thickness. Right ventricular systolic function is normal. There is moderately elevated pulmonary artery systolic pressure. The tricuspid regurgitant velocity is 3.07 m/s, and with an assumed right atrial pressure of 8 mmHg, the estimated right ventricular systolic pressure is 45.7 mmHg. Left Atrium: Left atrial size was normal in size. Right Atrium: Right atrial size was normal in size. Prominent Eustachian  valve. Pericardium: There is no evidence of pericardial effusion. Mitral Valve: The mitral valve is normal in structure. No evidence of mitral valve regurgitation. No evidence of mitral valve stenosis. Tricuspid Valve: The tricuspid valve is normal in structure. Tricuspid valve regurgitation is mild . No evidence of tricuspid stenosis. Aortic Valve: The aortic valve is tricuspid. Aortic valve regurgitation is not visualized. No aortic stenosis is present. Pulmonic Valve: The pulmonic valve was not well visualized. Pulmonic valve regurgitation is trivial. No evidence of pulmonic stenosis. Aorta: The aortic root is normal in size and structure. Venous: The inferior vena cava is dilated in size with greater than 50% respiratory variability, suggesting right atrial pressure of 8 mmHg. IAS/Shunts: No atrial level shunt detected by color flow Doppler. Additional Comments: 3D was performed not requiring image post processing on an independent workstation and was normal.  LEFT VENTRICLE PLAX 2D LVIDd:         3.70 cm         Diastology LVIDs:         2.70 cm         LV e' medial:    5.44 cm/s LV PW:         1.10 cm         LV E/e' medial:  13.6 LV IVS:        1.20 cm         LV e' lateral:   13.60 cm/s LVOT diam:     2.20 cm         LV E/e' lateral: 5.4 LV SV:         77 LV SV Index:   39              2D Longitudinal LVOT Area:     3.80 cm        Strain                                2D Strain GLS   -17.7 %                                Avg:  3D Volume EF                                LV 3D EF:    Left                                             ventricul                                             ar                                             ejection                                             fraction                                             by 3D                                             volume is                                             54 %.                                 3D  Volume EF:                                3D EF:        54 %                                LV EDV:       109 ml                                LV ESV:       51 ml                                LV SV:        58 ml RIGHT VENTRICLE             IVC RV Basal diam:  4.10 cm     IVC diam: 2.50 cm RV S prime:     14.50 cm/s TAPSE (  M-mode): 3.2 cm LEFT ATRIUM             Index        RIGHT ATRIUM           Index LA diam:        2.90 cm 1.48 cm/m   RA Area:     13.90 cm LA Vol (A2C):   35.1 ml 17.92 ml/m  RA Volume:   34.60 ml  17.67 ml/m LA Vol (A4C):   39.4 ml 20.12 ml/m LA Biplane Vol: 37.8 ml 19.30 ml/m  AORTIC VALVE LVOT Vmax:   105.00 cm/s LVOT Vmean:  69.000 cm/s LVOT VTI:    0.203 m  AORTA Ao Root diam: 3.10 cm Ao Asc diam:  3.30 cm MITRAL VALVE               TRICUSPID VALVE MV Area (PHT): 3.60 cm    TR Peak grad:   37.7 mmHg MV Decel Time: 211 msec    TR Vmax:        307.00 cm/s MV E velocity: 74.10 cm/s MV A velocity: 95.10 cm/s  SHUNTS MV E/A ratio:  0.78        Systemic VTI:  0.20 m                            Systemic Diam: 2.20 cm Jerel Croitoru MD Electronically signed by Jerel Balding MD Signature Date/Time: 02/10/2024/1:23:28 PM    Final    DG Chest 2 View Result Date: 02/09/2024 CLINICAL DATA:  Shortness of breath. EXAM: CHEST - 2 VIEW COMPARISON:  11/06/2023. FINDINGS: Moderate elevation of right hemidiaphragm again seen. Bilateral lung fields are clear. Bilateral costophrenic angles are clear. Normal cardio-mediastinal silhouette. No acute osseous abnormalities. The soft tissues are within normal limits. IMPRESSION: No active cardiopulmonary disease. Electronically Signed   By: Ree Molt M.D.   On: 02/09/2024 15:12   Labs:   Basic Metabolic Panel: Recent Labs  Lab 02/09/24 1419  NA 140  K 3.7  CL 88*  CO2 41*  GLUCOSE 136*  BUN 19  CREATININE 0.62  CALCIUM  9.6     CBC: Recent Labs  Lab 02/09/24 1419  WBC 5.8  HGB 10.1*  HCT 32.2*  MCV 93.3  PLT 154          SIGNED:   True Atlas, MD  [facility] 02/10/2024, 1:50 PM Time taking on discharge: 50 minutes

## 2024-02-12 ENCOUNTER — Telehealth: Payer: Self-pay

## 2024-02-12 ENCOUNTER — Telehealth: Payer: Self-pay | Admitting: *Deleted

## 2024-02-12 NOTE — Progress Notes (Signed)
 Complex Care Management Note  Care Guide Note 02/12/2024 Name: [name] MRN: [id number] DOB: Jan 11, 1946  [name] is a 78 y.o. year old female who sees Polite, Tanda, MD for primary care. I reached out to [name] by phone today to offer complex care management services.  Ms. Ilg was given information about Complex Care Management services today including:   The Complex Care Management services include support from the care team which includes your Nurse Care Manager, Clinical Social Worker, or Pharmacist.  The Complex Care Management team is here to help remove barriers to the health concerns and goals most important to you. Complex Care Management services are voluntary, and the patient may decline or stop services at any time by request to their care team member.   Complex Care Management Consent Status: Patient agreed to services and verbal consent obtained.   Follow up plan:  Telephone appointment with complex care management team member scheduled for:  02/28/24 Routed referral to care guides to help with transportation and food resources.   Encounter Outcome:  Patient Scheduled  Harlene Satterfield  [facility]  [facility], [facility] Guide  Direct Dial: [phone]  Fax [phone]

## 2024-02-12 NOTE — Progress Notes (Signed)
 Complex Care Management Note Care Guide Note  02/12/2024 Name: Amy [name] MRN: [id number] DOB: 1946-01-30  [name] is a 78 y.o. year old female who is a primary care patient of Polite, Tanda, MD . The community resource team was consulted for assistance with Transportation Needs   SDOH screenings and interventions completed:  Yes  Social Drivers of Health From This Encounter   Food Insecurity: No Food Insecurity (02/12/2024)   Hunger Vital Sign    Worried About Running Out of Food in the Last Year: Never true    Ran Out of Food in the Last Year: Never true  Housing: Low Risk  (02/12/2024)   Housing Stability Vital Sign    Unable to Pay for Housing in the Last Year: No    Number of Times Moved in the Last Year: 0    Homeless in the Last Year: No  Financial Resource Strain: Low Risk  (02/12/2024)   Overall Financial Resource Strain (CARDIA)    Difficulty of Paying Living Expenses: Not very hard  Transportation Needs: No Transportation Needs (02/12/2024)   PRAPARE - Administrator, Civil Service (Medical): No    Lack of Transportation (Non-Medical): No  Utilities: Not At Risk (02/12/2024)   Utilities    Threatened with loss of utilities: No    SDOH Interventions Today    Flowsheet Row Most Recent Value  SDOH Interventions   Food Insecurity Interventions Other (Comment)  [Patient stated she does not need food assistance at this time.]  Transportation Interventions Other (Comment)  [Spoke with patien to complete online [employer] transportation application. Faxed Part B application to patient's PCP to be completed and faxed to [employer] [phone]. Received confirmation email from [employer].]     Care guide performed the following interventions: Patient provided with information about care guide support team and interviewed to confirm resource needs.  Follow Up Plan:  Let patient know to expect a call from [employer] in a few days. Mailed [employer]  brochure.  Encounter Outcome:  Patient Visit Completed  Tongela Encinas [facility]  [facility] Guide Direct Dial: [phone]  Fax: [phone] Website: [email or web address]

## 2024-02-13 LAB — HEMOGLOBIN A1C
Hgb A1c MFr Bld: 5.7 % — ABNORMAL HIGH (ref 4.8–5.6)
Mean Plasma Glucose: 117 mg/dL

## 2024-02-22 DIAGNOSIS — C3491 Malignant neoplasm of unspecified part of right bronchus or lung: Secondary | ICD-10-CM | POA: Diagnosis not present

## 2024-02-22 DIAGNOSIS — C50411 Malignant neoplasm of upper-outer quadrant of right female breast: Secondary | ICD-10-CM | POA: Diagnosis not present

## 2024-02-22 DIAGNOSIS — I10 Essential (primary) hypertension: Secondary | ICD-10-CM | POA: Diagnosis not present

## 2024-02-22 DIAGNOSIS — Z85038 Personal history of other malignant neoplasm of large intestine: Secondary | ICD-10-CM | POA: Diagnosis not present

## 2024-02-26 ENCOUNTER — Telehealth: Payer: Self-pay

## 2024-02-26 NOTE — Telephone Encounter (Signed)
 Copied from CRM #[id number]. Topic: Clinical - Medication Question >> Feb 26, 2024  2:04 PM Amy [name] wrote: Reason for CRM: Patient wants to speak with Dr. Saundra nurse for samples budesonide -formoterol  (SYMBICORT ) 160-4.5 MCG/ACT inhaler and Albuterol  Sulfate (PROAIR  RESPICLICK) 108 (90 Base) MCG/ACT AEPB. Patient cannot afford it, she paid $238 for one medication last month. Please advise and call back [phone].     Spoke with patient reached out to Pharm team to see if they can give an alternative    NFN-

## 2024-02-27 ENCOUNTER — Other Ambulatory Visit (HOSPITAL_COMMUNITY): Payer: Self-pay

## 2024-02-27 ENCOUNTER — Telehealth: Payer: Self-pay

## 2024-02-27 NOTE — Telephone Encounter (Signed)
 Patient has a deductible to meet. Brand Symbicort  is covered at this time for $65.12/30 days. After deductible is met, copay should go down to $47.00

## 2024-02-27 NOTE — Telephone Encounter (Signed)
 Covered alternatives per test claims are as follows at this time:   Generic Proventil /Albuterol  HFA 6.7gm- $5.00 BranVentolin  HFA 18gm- $57.91 GeneriVentolinAlbuterol  HFA 18gm- $5.00 GeneriProAirAlbuterol  HFA 8.5gm-$5.00

## 2024-02-27 NOTE — Telephone Encounter (Signed)
 Patient has a deductible to meet before prices go down more. Test claims show that Brand Symbicort  is currently covered for $65.12/30 days. Generic Albuterol  HFA inhalers are preferred over ProAir  Respiclick.

## 2024-02-28 ENCOUNTER — Other Ambulatory Visit: Payer: Self-pay

## 2024-02-28 NOTE — Telephone Encounter (Signed)
 Spoke with patient when she had called she paid the deductible her pharmacy did not explain that that's what is was.    She verbalized understanding    NFN-

## 2024-02-28 NOTE — Patient Instructions (Signed)
 Visit Information  Thank you for taking time to visit with me today. Please don't hesitate to contact me if I can be of assistance to you before our next scheduled appointment.  Our next appointment is by telephone on 03/14/24 at 1 PM Please call the care guide team at [phone] if you need to cancel or reschedule your appointment.   Following is a copy of your care plan:   Goals Addressed             This Visit's Progress    VBCI RN Care Plan   On track    Problems:  Chronic Disease Management support and education needs related to COPD  Goal: Over the next 30 days the Patient will demonstrate a decrease COPD in exacerbations as evidenced by patient report of decreased symptoms of shortness of breath or gravely voice demonstrate Ongoing adherence to prescribed treatment plan for COPD as evidenced by patient report of compliance with inhalers and oxygen  use  Interventions:   COPD Interventions: Advised patient to track and manage COPD triggers Advised patient to self assesses COPD action plan zone and make appointment with provider if in the yellow zone for 48 hours without improvement Assessed social determinant of health barriers Discussed the importance of adequate rest and management of fatigue with COPD Provided education about and advised patient to utilize infection prevention strategies to reduce risk of respiratory infection Provided instruction about proper use of medications used for management of COPD including inhalers Provided patient with basic written and verbal COPD education on self care/management/and exacerbation prevention Screening for signs and symptoms of depression related to chronic disease state  Use of home oxygen  Call placed to home health agency [facility]) and PCP regarding PT referral placed following admission. Message left for CMA, waiting for return call  Patient Self-Care Activities:  Attend all scheduled provider appointments Call provider  office for new concerns or questions  Perform all self care activities independently  Perform IADL's (shopping, preparing meals, housekeeping, managing finances) independently Take medications as prescribed   identify and remove indoor air pollutants limit outdoor activity during cold weather listen for public air quality announcements every day begin a symptom diary develop a rescue plan eliminate symptom triggers at home follow rescue plan if symptoms flare-up Reschedule appointment with dentist and find/schedule appointment with an eye doctor  Plan:  Telephone follow up appointment with care management team member scheduled for:  03/14/24 at 1 PM             Please call the Suicide and Crisis Lifeline: 988 call 1-800-273-TALK (toll free, 24 hour hotline) if you are experiencing a Mental Health or Behavioral Health Crisis or need someone to talk to.  The patient verbalized understanding of instructions, educational materials, and care plan provided today and DECLINED offer to receive copy of patient instructions, educational materials, and care plan.   Rosaline Finlay, RN MSN [place]  VBCI Population Health RN Care Manager Direct Dial: [phone]  Fax: [phone]

## 2024-02-28 NOTE — Patient Outreach (Signed)
 Complex Care Management   Visit Note  02/28/2024  Name:  Amy [name] MRN: [id number] DOB: Aug 22, 1945  Situation: Referral received for Complex Care Management related to COPD I obtained verbal consent from Patient.  Visit completed with [name]  on the phone  Background:   Past Medical History:  Diagnosis Date   Allergy    Anemia    Anxiety    Arthritis    Asthma exacerbation 11/03/2021   Blood dyscrasia    Sickle Cell traits   Blood transfusion without reported diagnosis 1999   prbc S/P COLON SURGERY   Breast cancer, right breast (HCC) 10/03/2012   Cancer (HCC)    colon,renal,lung   Cataract    NO SURGERY   Chronic kidney disease 1999   RIGHT KIDNEY REMOVED   Colon cancer (HCC) 1999, 2013   COPD (chronic obstructive pulmonary disease) (HCC)    Depression    Diabetes mellitus    diet controlled   GERD (gastroesophageal reflux disease)    H/O: lung cancer    Heart murmur    Hernia    Near umbilicus   History of colon cancer    History of kidney cancer    Hot flashes    5 yr pill for breast CA causing hot flashes   HX: breast cancer 2014   --right breast   Hypertension    Does not see a cardiologist   Internal hemorrhoids    Interstitial lung disease (HCC) 11/25/2015   Lung cancer (HCC) 2001   Neuromuscular disorder (HCC)    PERIPHERAL NEUROPATHY FEET   Shortness of breath    with exertion    Sickle cell trait (HCC)    Tubular adenoma of colon    Ulcer    HX YEARS AGO    Assessment: Patient Reported Symptoms:  Cognitive Cognitive Status: Able to follow simple commands, Alert and oriented to person, place, and time, Normal speech and language skills, Struggling with memory recall Cognitive/Intellectual Conditions Management [RPT]: None reported or documented in medical history or problem list      Neurological Neurological Review of Symptoms: Other: [place] Neurological Symptoms/Conditions [RPT]: Patient reports her memory is not what it used to be.  She has had a memory evaluation, but nothing significant was identified. Patient reports it takes her a bit longer to identify certain words or remember things such as what she ate yesterday. Note during assessment that patient does take extra time to find the words she is looking for. Patient answers questions appropriately. Neurological Management Strategies: Routine screening  HEENT HEENT Symptoms Reported: Other: HEENT Management Strategies: Routine screening, Coping strategies    Cardiovascular Cardiovascular Symptoms Reported: No symptoms reported Does patient have uncontrolled Hypertension?: No Cardiovascular Management Strategies: Medication therapy Cardiovascular Comment: Confirmed patient does have BP cuff. She checks her BP when she feels that it is up. Advised that she begin checking 2-3 times a week.  Respiratory Respiratory Symptoms Reported: Shortness of breath Other Respiratory Symptoms: Recent hospitalization for COPD exacerbation. She reports shortness of breath sometimes since being home with the weather being like it is. She is using Albuterol  inhaler about twice a week and feels that it helps relieve symptoms. Patient denies cough. Discussed COPD triggers and importance of limiting exposure. Patient reports that she is up-to-date on vaccinations. Additional Respiratory Details: Patient has regular follow-up with pulmonology. Next visit 05/07/24. Respiratory Management Strategies: Oxygen  therapy, Medication therapy, Routine screening (2-3 L O2 via [place])  Endocrine Endocrine Symptoms Reported: No symptoms reported  Is patient diabetic?: Yes Is patient checking blood sugars at home?: Yes List most recent blood sugar readings, include date and time of day: if I feel like my sugar is up Symptoms include headache, feeling hot    Gastrointestinal Gastrointestinal Symptoms Reported: Unintentional weight gain Additional Gastrointestinal Details: Patient reports she was having issues  with diarrhea but was advised to start Benefiber and Gas-X by her provider. She reports since starting medications, diarrhea has resolved. Last BM 02/25/24. She reports that her appetite is not like it used to. When she doesn't eat as much, she will go several days without having a BM. When her appetite is down she will drink an Ensure. Patient reports unintentional weight loss of about 43 lbs due to decreased appetite. She reports that she has discussed weight loss with her PCP, who advised an office visit to discuss. She still needs to schedule that appointment. Gastrointestinal Management Strategies: Medication therapy    Genitourinary Genitourinary Symptoms Reported: No symptoms reported    Integumentary Integumentary Symptoms Reported: No symptoms reported    Musculoskeletal Musculoskelatal Symptoms Reviewed: Difficulty walking, Unsteady gait Musculoskeletal Management Strategies: Medical device Musculoskeletal Comment: HH PT ([facility]) was ordered at discharge. Patient reports they have not set up a time to come out. Patient does not do any regular execise. Call placed to [facility] to confirm they have received referral. They tried to reach her several times (7/21, 7/23, 7/24) and were not able to get in touch with her, so they did not admit her. They will need a new order. Call placed to PCP office and message left for Darlene (PCP's CMA). Falls in the past year?: Yes Number of falls in past year: 2 or more Was there an injury with Fall?: No Fall Risk Category Calculator: 2 Patient Fall Risk Level: Moderate Fall Risk Patient at Risk for Falls Due to: History of fall(s), Medication side effect, Impaired balance/gait Fall risk Follow up: Falls evaluation completed, Education provided, Falls prevention discussed  Psychosocial Psychosocial Symptoms Reported: Depression - if selected complete PHQ 2-9, Alteration in sleep habits Additional Psychological Details: Patient reports that she has seen  counselors and psychiatrist previously. She did not find it very helpful, declines at this time. Behavioral Management Strategies: Medication therapy Behavioral Health Comment: Patient reports that PCP manages depression. Major Change/Loss/Stressor/Fears (CP): Medical condition, self, Death of a loved one (Losing family members (son, parents)) Behaviors When Feeling Stressed/Fearful: I just lay in the bed Techniques to Cope with Loss/Stress/Change: Diversional activities Quality of Family Relationships: helpful, involved, supportive (Brother, sister-in-law, cousins) Do you feel physically threatened by others?: No      02/28/2024    1:23 PM  Depression screen PHQ 2/9  Decreased Interest 1  Down, Depressed, Hopeless 3  PHQ - 2 Score 4  Altered sleeping 3  Tired, decreased energy 1  Change in appetite 3  Feeling bad or failure about yourself  0  Trouble concentrating 2  Moving slowly or fidgety/restless 0  Suicidal thoughts 0  PHQ-9 Score 13    There were no vitals filed for this visit.  Medications Reviewed Today     Reviewed by Arno Rosaline SQUIBB, RN (Registered Nurse) on 02/28/24 at 1312  Med List Status: <None>   Medication Order Taking? Sig Documenting Provider Last Dose Status Informant  acetaminophen  (TYLENOL ) 500 MG tablet [id number] Yes Take 1,000 mg by mouth every 6 (six) hours as needed for moderate pain. [provider]  Active Self, Pharmacy Records  Albuterol  Sulfate (PROAIR  RESPICLICK)  108 (90 Base) MCG/ACT AEPB [id number] Yes Inhale 2 puffs every 6 hours as needed- rescue Neysa Reggy BIRCH, MD  Active Self, Pharmacy Records  Artificial Saliva (DRY MOUTH DROPS) EMMALINE [id number] Yes Use as directed 1 lozenge in the mouth or throat as needed. [provider]  Active Self, Pharmacy Records  Ascorbic Acid  (VITAMIN C  WITH ROSE HIPS) 500 MG tablet [id number] Yes Take 500 mg by mouth daily. [provider]  Active Self, Pharmacy Records  atorvastatin   (LIPITOR) 20 MG tablet [id number] Yes Take 20 mg by mouth daily.  [provider]  Active Self, Pharmacy Records  budesonide -formoterol  (SYMBICORT ) 160-4.5 MCG/ACT inhaler [id number] Yes Inhale 2 puffs then rinse mouth, twice daily Neysa Reggy D, MD  Active Self, Pharmacy Records  Calcium  Carbonate-Vitamin D  (CALCIUM -VITAMIN D ) 500-200 MG-UNIT per tablet [id number] Yes Take 1 tablet by mouth every Monday, Wednesday, and Friday.  [provider]  Active Self, Pharmacy Records  Ferrous Gluconate  (IRON ) 240 (27 FE) MG TABS [id number] Yes Take 1 tablet by mouth every morning. [provider]  Active Self, Pharmacy Records           Med Note DONETTE, CARLA H   Tue Jan 15, 2013  2:28 PM)                          guaiFENesin  (MUCINEX ) 600 MG 12 hr tablet [id number] Yes Take 1 tablet (600 mg total) by mouth 2 (two) times daily. Swayze, Ava, DO  Active Self, Pharmacy Records  hydrochlorothiazide  25 MG tablet [id number] Yes Take 25 mg by mouth daily before breakfast.  [provider]  Active Self, Pharmacy Records  losartan  (COZAAR ) 50 MG tablet [id number] Yes Take 50 mg by mouth daily. [provider]  Active Self, Pharmacy Records  Melatonin-Pyridoxine (MELATIN PO) [id number] Yes Take 1 tablet by mouth as needed. [provider]  Active Self, Pharmacy Records  metFORMIN  (GLUCOPHAGE ) 500 MG tablet [id number] Yes Take 500 mg by mouth daily with breakfast.  [provider]  Active Self, Pharmacy Records  metoprolol  succinate (TOPROL -XL) 100 MG 24 hr tablet [id number] Yes Take 100 mg by mouth daily. [provider]  Active Self, Pharmacy Records  Multiple Vitamin (MULTIVITAMIN WITH MINERALS) TABS tablet [id number]  Take 1 tablet by mouth daily. Swayze, Ava, DO  Active Self, Pharmacy Records  omeprazole (PRILOSEC) 20 MG capsule [id number]  Take 20 mg by mouth daily before breakfast. [provider]  Active Self, Pharmacy Records  PARoxetine  (PAXIL ) 40  MG tablet [id number]  Take 40 mg by mouth daily before breakfast.  [provider]  Active Self, Pharmacy Records  traMADol  (ULTRAM ) 50 MG tablet [id number]  Take 50 mg by mouth every 12 (twelve) hours as needed for severe pain. [provider]  Active Self, Pharmacy Records           Med Note ALAIN JOESPH DELENA Pablo Feb 29, 2016 11:21 AM)              Recommendation:   Home Health requests: Physical therapy. Message left for PCP requesting. Continue Current Plan of Care  Follow Up Plan:   Telephone follow up appointment date/time:  03/14/24 at 1 PM  Rosaline Finlay, RN MSN   [facility] RN Care Manager Direct Dial: [phone]  Fax: [phone]

## 2024-03-14 ENCOUNTER — Other Ambulatory Visit: Payer: Self-pay

## 2024-03-14 NOTE — Patient Outreach (Signed)
 Complex Care Management   Visit Note  03/14/2024  Name:  Amy [name] MRN: [id number] DOB: Jan 28, 1946  Situation: Referral received for Complex Care Management related to COPD I obtained verbal consent from Patient.  Visit completed with Patient  on the phone  Background:   Past Medical History:  Diagnosis Date   Allergy    Anemia    Anxiety    Arthritis    Asthma exacerbation 11/03/2021   Blood dyscrasia    Sickle Cell traits   Blood transfusion without reported diagnosis 1999   prbc S/P COLON SURGERY   Breast cancer, right breast (HCC) 10/03/2012   Cancer (HCC)    colon,renal,lung   Cataract    NO SURGERY   Chronic kidney disease 1999   RIGHT KIDNEY REMOVED   Colon cancer (HCC) 1999, 2013   COPD (chronic obstructive pulmonary disease) (HCC)    Depression    Diabetes mellitus    diet controlled   GERD (gastroesophageal reflux disease)    H/O: lung cancer    Heart murmur    Hernia    Near umbilicus   History of colon cancer    History of kidney cancer    Hot flashes    5 yr pill for breast CA causing hot flashes   HX: breast cancer 2014   --right breast   Hypertension    Does not see a cardiologist   Internal hemorrhoids    Interstitial lung disease (HCC) 11/25/2015   Lung cancer (HCC) 2001   Neuromuscular disorder (HCC)    PERIPHERAL NEUROPATHY FEET   Shortness of breath    with exertion    Sickle cell trait (HCC)    Tubular adenoma of colon    Ulcer    HX YEARS AGO    Assessment: Patient Reported Symptoms:  Cognitive Cognitive Status: Able to follow simple commands, Alert and oriented to person, place, and time, Normal speech and language skills Cognitive/Intellectual Conditions Management [RPT]: None reported or documented in medical history or problem list      Neurological Neurological Review of Symptoms: Not assessed    HEENT HEENT Symptoms Reported: Not assessed      Cardiovascular Cardiovascular Symptoms Reported: No symptoms  reported Does patient have uncontrolled Hypertension?: Yes Is patient checking Blood Pressure at home?: Yes Patient's Recent BP reading at home: 144/97, 125/96, 134/95 Cardiovascular Management Strategies: Medication therapy Cardiovascular Comment: Patient has been checking BP since previous CMRN visit and has kept a log of readings. Assisted patient to reschedule missed hospital f/u with PCP, scheduled 03/20/24 at 2 PM.  Respiratory Respiratory Symptoms Reported: Other: Other Respiratory Symptoms: Patient denies respiratory symptoms today. She continues to use Albuterol  inhaler about twice a week. Respiratory Management Strategies: Medication therapy, Oxygen  therapy, Routine screening  Endocrine      Gastrointestinal Gastrointestinal Symptoms Reported: Change in appetite Additional Gastrointestinal Details: Patient denies further weight loss since previous CMRN visit. She reports some days her appetite is good, and other days it isn't. Assisted patient to reschedule missed hospital f/u with PCP, Scheduled 03/20/24 at 2 PM. Gastrointestinal Management Strategies: Medication therapy    Genitourinary Genitourinary Symptoms Reported: Not assessed    Integumentary Integumentary Symptoms Reported: Not assessed    Musculoskeletal Musculoskelatal Symptoms Reviewed: Difficulty walking, Unsteady gait Musculoskeletal Management Strategies: Medical device Musculoskeletal Comment: Paitent reports she still has not heard from [facility]. Message left with PCP. Advised to discuss at upcoming appointment next week. Falls in the past year?: Yes Number of falls in  past year: 2 or more (No falls since previous CMRN visit.) Was there an injury with Fall?: No Fall Risk Category Calculator: 2 Patient Fall Risk Level: Moderate Fall Risk Patient at Risk for Falls Due to: History of fall(s), Medication side effect, Impaired balance/gait Fall risk Follow up: Falls evaluation completed, Education provided, Falls  prevention discussed  Psychosocial Psychosocial Symptoms Reported: Depression - if selected complete PHQ 2-9 Additional Psychological Details: Patient reports continued feelings of decreased motivation and depression. Advised to discuss with PCP at upcoming visit. Behavioral Management Strategies: Medication therapy        03/14/2024    PHQ2-9 Depression Screening   Little interest or pleasure in doing things    Feeling down, depressed, or hopeless    PHQ-2 - Total Score    Trouble falling or staying asleep, or sleeping too much    Feeling tired or having little energy    Poor appetite or overeating     Feeling bad about yourself - or that you are a failure or have let yourself or your family down    Trouble concentrating on things, such as reading the newspaper or watching television    Moving or speaking so slowly that other people could have noticed.  Or the opposite - being so fidgety or restless that you have been moving around a lot more than usual    Thoughts that you would be better off dead, or hurting yourself in some way    PHQ2-9 Total Score    If you checked off any problems, how difficult have these problems made it for you to do your work, take care of things at home, or get along with other people    Depression Interventions/Treatment      There were no vitals filed for this visit.  Medications Reviewed Today     Reviewed by Arno Rosaline SQUIBB, RN (Registered Nurse) on 03/14/24 at 1324  Med List Status: <None>   Medication Order Taking? Sig Documenting Provider Last Dose Status Informant  acetaminophen  (TYLENOL ) 500 MG tablet [id number]  Take 1,000 mg by mouth every 6 (six) hours as needed for moderate pain. [provider]  Active Self, Pharmacy Records  Albuterol  Sulfate (PROAIR  RESPICLICK) 108 (90 Base) MCG/ACT AEPB [id number]  Inhale 2 puffs every 6 hours as needed- rescue Neysa Reggy BIRCH, MD  Active Self, Pharmacy Records  Artificial Saliva (DRY MOUTH  DROPS) EMMALINE [id number]  Use as directed 1 lozenge in the mouth or throat as needed. [provider]  Active Self, Pharmacy Records  Ascorbic Acid  (VITAMIN C  WITH ROSE HIPS) 500 MG tablet [id number]  Take 500 mg by mouth daily. [provider]  Active Self, Pharmacy Records  atorvastatin  (LIPITOR) 20 MG tablet [id number]  Take 20 mg by mouth daily.  [provider]  Active Self, Pharmacy Records  budesonide -formoterol  (SYMBICORT ) 160-4.5 MCG/ACT inhaler [id number]  Inhale 2 puffs then rinse mouth, twice daily Neysa Reggy D, MD  Active Self, Pharmacy Records  Calcium  Carbonate-Vitamin D  (CALCIUM -VITAMIN D ) 500-200 MG-UNIT per tablet [id number]  Take 1 tablet by mouth every Monday, Wednesday, and Friday.  [provider]  Active Self, Pharmacy Records  Ferrous Gluconate  (IRON ) 240 (27 FE) MG TABS [id number]  Take 1 tablet by mouth every morning. [provider]  Active Self, Pharmacy Records           Med Note DONETTE, CARLA H   Tue Jan 15, 2013  2:28 PM)  guaiFENesin  (MUCINEX ) 600 MG 12 hr tablet [id number]  Take 1 tablet (600 mg total) by mouth 2 (two) times daily. Swayze, Ava, DO  Active Self, Pharmacy Records  hydrochlorothiazide  25 MG tablet [id number]  Take 25 mg by mouth daily before breakfast.  [provider]  Active Self, Pharmacy Records  losartan  (COZAAR ) 50 MG tablet [id number]  Take 50 mg by mouth daily. [provider]  Active Self, Pharmacy Records  Melatonin-Pyridoxine (MELATIN PO) [id number]  Take 1 tablet by mouth as needed. [provider]  Active Self, Pharmacy Records  metFORMIN  (GLUCOPHAGE ) 500 MG tablet [id number]  Take 500 mg by mouth daily with breakfast.  [provider]  Active Self, Pharmacy Records  metoprolol  succinate (TOPROL -XL) 100 MG 24 hr tablet [id number]  Take 100 mg by mouth daily. [provider]  Active Self, Pharmacy Records  Multiple Vitamin (MULTIVITAMIN WITH  MINERALS) TABS tablet [id number]  Take 1 tablet by mouth daily. Swayze, Ava, DO  Active Self, Pharmacy Records  omeprazole (PRILOSEC) 20 MG capsule [id number]  Take 20 mg by mouth daily before breakfast. [provider]  Active Self, Pharmacy Records  PARoxetine  (PAXIL ) 40 MG tablet [id number]  Take 40 mg by mouth daily before breakfast.  [provider]  Active Self, Pharmacy Records  traMADol  (ULTRAM ) 50 MG tablet [id number]  Take 50 mg by mouth every 12 (twelve) hours as needed for severe pain.  Patient not taking: Reported on 02/28/2024   [provider]  Active Self, Pharmacy Records           Med Note ALAIN JOESPH DELENA Pablo Feb 29, 2016 11:21 AM)              Recommendation:   PCP Follow-up Continue Current Plan of Care  Follow Up Plan:   Telephone follow up appointment date/time:  03/21/24 at 3 PM  Rosaline Finlay, RN MSN [place]  [facility] Population Health RN Care Manager Direct Dial: [phone]  Fax: [phone]

## 2024-03-14 NOTE — Patient Instructions (Signed)
 Visit Information  Thank you for taking time to visit with me today. Please don't hesitate to contact me if I can be of assistance to you before our next scheduled appointment.  Your next care management appointment is by telephone on 03/21/24 at 2 PM  Please call the care guide team at [phone] if you need to cancel, schedule, or reschedule an appointment.   Please call the Suicide and Crisis Lifeline: 988 call 1-800-273-TALK (toll free, 24 hour hotline) if you are experiencing a Mental Health or Behavioral Health Crisis or need someone to talk to.  Rosaline Finlay, RN MSN [place]  [facility] Population Health RN Care Manager Direct Dial: [phone]  Fax: [phone]

## 2024-03-20 DIAGNOSIS — E1169 Type 2 diabetes mellitus with other specified complication: Secondary | ICD-10-CM | POA: Diagnosis not present

## 2024-03-20 DIAGNOSIS — R634 Abnormal weight loss: Secondary | ICD-10-CM | POA: Diagnosis not present

## 2024-03-20 DIAGNOSIS — J9611 Chronic respiratory failure with hypoxia: Secondary | ICD-10-CM | POA: Diagnosis not present

## 2024-03-20 DIAGNOSIS — J439 Emphysema, unspecified: Secondary | ICD-10-CM | POA: Diagnosis not present

## 2024-03-21 ENCOUNTER — Other Ambulatory Visit: Payer: Self-pay

## 2024-03-21 NOTE — Patient Instructions (Signed)
 Visit Information  Thank you for taking time to visit with me today. Please don't hesitate to contact me if I can be of assistance to you before our next scheduled appointment.  Your next care management appointment is by telephone on 04/18/24 at 3 PM  Please call the care guide team at [phone] if you need to cancel, schedule, or reschedule an appointment.   Please call the Suicide and Crisis Lifeline: 988 call 1-800-273-TALK (toll free, 24 hour hotline) if you are experiencing a Mental Health or Behavioral Health Crisis or need someone to talk to.  Rosaline Finlay, RN MSN [place]  [facility] Population [facility] RN Care Manager Direct Dial: [phone]  Fax: [phone]

## 2024-03-21 NOTE — Patient Outreach (Signed)
 Complex Care Management   Visit Note  03/21/2024  Name:  Amy [name] MRN: [id number] DOB: 11/11/1945  Situation: Referral received for Complex Care Management related to COPD I obtained verbal consent from Patient.  Visit completed with Patient  on the phone  Background:   Past Medical History:  Diagnosis Date   Allergy    Anemia    Anxiety    Arthritis    Asthma exacerbation 11/03/2021   Blood dyscrasia    Sickle Cell traits   Blood transfusion without reported diagnosis 1999   prbc S/P COLON SURGERY   Breast cancer, right breast (HCC) 10/03/2012   Cancer (HCC)    colon,renal,lung   Cataract    NO SURGERY   Chronic kidney disease 1999   RIGHT KIDNEY REMOVED   Colon cancer (HCC) 1999, 2013   COPD (chronic obstructive pulmonary disease) (HCC)    Depression    Diabetes mellitus    diet controlled   GERD (gastroesophageal reflux disease)    H/O: lung cancer    Heart murmur    Hernia    Near umbilicus   History of colon cancer    History of kidney cancer    Hot flashes    5 yr pill for breast CA causing hot flashes   HX: breast cancer 2014   --right breast   Hypertension    Does not see a cardiologist   Internal hemorrhoids    Interstitial lung disease (HCC) 11/25/2015   Lung cancer (HCC) 2001   Neuromuscular disorder (HCC)    PERIPHERAL NEUROPATHY FEET   Shortness of breath    with exertion    Sickle cell trait (HCC)    Tubular adenoma of colon    Ulcer    HX YEARS AGO    Assessment: Patient Reported Symptoms:  Cognitive Cognitive Status: Able to follow simple commands, Alert and oriented to person, place, and time, Normal speech and language skills Cognitive/Intellectual Conditions Management [RPT]: None reported or documented in medical history or problem list      Neurological      HEENT HEENT Symptoms Reported: Other: HEENT Management Strategies: Routine screening, Coping strategies HEENT Comment: Reminded patient to schedule eye doctor  appointment    Cardiovascular Cardiovascular Symptoms Reported: No symptoms reported Does patient have uncontrolled Hypertension?: Yes Is patient checking Blood Pressure at home?: Yes Patient's Recent BP reading at home: 120/80 yesterday per patient Cardiovascular Management Strategies: Medication therapy  Respiratory Respiratory Symptoms Reported: No symptoms reported Additional Respiratory Details: Patient continues to use maintenance inhaler as prescribed. She has used PRN Albuterol  once this week. Reminded patient of importance of staying up-to-date on all vaccinations. Respiratory Management Strategies: Medication therapy, Oxygen  therapy, Routine screening  Endocrine Endocrine Symptoms Reported: No symptoms reported Is patient diabetic?: Yes Is patient checking blood sugars at home?: No Endocrine Comment: Patient notes she has been unable to check blood sugar because her monitor is out of batteries. Advised to get new batteries so she can monitor at home.  Gastrointestinal Gastrointestinal Symptoms Reported: Change in appetite Additional Gastrointestinal Details: Patient reports she discussed weight loss with PCP at office visit 03/20/24. She reports PCP was not concerned about weight loss and did not make any changes to plan of care. She reports she continues to have regular BMs. Gastrointestinal Management Strategies: Medication therapy    Genitourinary Genitourinary Symptoms Reported: No symptoms reported    Integumentary Integumentary Symptoms Reported: No symptoms reported    Musculoskeletal Musculoskelatal Symptoms Reviewed: Difficulty walking, Unsteady  gait Musculoskeletal Management Strategies: Medical device Elene) Musculoskeletal Comment: Patient reports she still has not heard from [facility] PT. Call placed to PCP office to again request referral, message left with Darlene CMA to PCP. Falls in the past year?: Yes Number of falls in past year: 2 or more Was there an injury with  Fall?: No Fall Risk Category Calculator: 2 Patient Fall Risk Level: Moderate Fall Risk Patient at Risk for Falls Due to: History of fall(s), Medication side effect, Impaired balance/gait Fall risk Follow up: Falls evaluation completed, Education provided, Falls prevention discussed  Psychosocial Psychosocial Symptoms Reported: Depression - if selected complete PHQ 2-9 Additional Psychological Details: Patient reports she did not have a chance to discuss depression/decreased motivation with PCP. She feels like getting out of the house to go to appointment helped her to feel better. Patient does not wish to discuss this further with PCP. Behavioral Management Strategies: Medication therapy Major Change/Loss/Stressor/Fears (CP): Medical condition, self, Death of a loved one      April 13, 2024    PHQ2-9 Depression Screening   Little interest or pleasure in doing things    Feeling down, depressed, or hopeless    PHQ-2 - Total Score    Trouble falling or staying asleep, or sleeping too much    Feeling tired or having little energy    Poor appetite or overeating     Feeling bad about yourself - or that you are a failure or have let yourself or your family down    Trouble concentrating on things, such as reading the newspaper or watching television    Moving or speaking so slowly that other people could have noticed.  Or the opposite - being so fidgety or restless that you have been moving around a lot more than usual    Thoughts that you would be better off dead, or hurting yourself in some way    PHQ2-9 Total Score    If you checked off any problems, how difficult have these problems made it for you to do your work, take care of things at home, or get along with other people    Depression Interventions/Treatment      There were no vitals filed for this visit.  Medications Reviewed Today     Reviewed by Arno Rosaline SQUIBB, RN (Registered Nurse) on 04-13-24 at 1506  Med List Status: <None>    Medication Order Taking? Sig Documenting Provider Last Dose Status Informant  acetaminophen  (TYLENOL ) 500 MG tablet [id number]  Take 1,000 mg by mouth every 6 (six) hours as needed for moderate pain. [provider]  Active Self, Pharmacy Records  Albuterol  Sulfate (PROAIR  RESPICLICK) 108 (90 Base) MCG/ACT AEPB [id number]  Inhale 2 puffs every 6 hours as needed- rescue Neysa Reggy BIRCH, MD  Active Self, Pharmacy Records  Artificial Saliva (DRY MOUTH DROPS) EMMALINE [id number]  Use as directed 1 lozenge in the mouth or throat as needed. [provider]  Active Self, Pharmacy Records  Ascorbic Acid  (VITAMIN C  WITH ROSE HIPS) 500 MG tablet [id number]  Take 500 mg by mouth daily. [provider]  Active Self, Pharmacy Records  atorvastatin  (LIPITOR) 20 MG tablet [id number]  Take 20 mg by mouth daily.  [provider]  Active Self, Pharmacy Records  budesonide -formoterol  (SYMBICORT ) 160-4.5 MCG/ACT inhaler [id number]  Inhale 2 puffs then rinse mouth, twice daily Neysa Reggy BIRCH, MD  Active Self, Pharmacy Records  Calcium  Carbonate-Vitamin D  (CALCIUM -VITAMIN D ) 500-200 MG-UNIT per tablet [id number]  Take 1 tablet by mouth  every Monday, Wednesday, and Friday.  [provider]  Active Self, Pharmacy Records  Ferrous Gluconate  (IRON ) 240 (27 FE) MG TABS [id number]  Take 1 tablet by mouth every morning. [provider]  Active Self, Pharmacy Records           Med Note DONETTE, CARLA H   Tue Jan 15, 2013  2:28 PM)                          guaiFENesin  (MUCINEX ) 600 MG 12 hr tablet [id number]  Take 1 tablet (600 mg total) by mouth 2 (two) times daily. Swayze, Ava, DO  Active Self, Pharmacy Records  hydrochlorothiazide  25 MG tablet [id number]  Take 25 mg by mouth daily before breakfast.  [provider]  Active Self, Pharmacy Records  losartan  (COZAAR ) 50 MG tablet [id number]  Take 50 mg by mouth daily. [provider]  Active Self, Pharmacy Records   Melatonin-Pyridoxine (MELATIN PO) [id number]  Take 1 tablet by mouth as needed. [provider]  Active Self, Pharmacy Records  metFORMIN  (GLUCOPHAGE ) 500 MG tablet [id number]  Take 500 mg by mouth daily with breakfast.  [provider]  Active Self, Pharmacy Records  metoprolol  succinate (TOPROL -XL) 100 MG 24 hr tablet [id number]  Take 100 mg by mouth daily. [provider]  Active Self, Pharmacy Records  Multiple Vitamin (MULTIVITAMIN WITH MINERALS) TABS tablet [id number]  Take 1 tablet by mouth daily. Swayze, Ava, DO  Active Self, Pharmacy Records  omeprazole (PRILOSEC) 20 MG capsule [id number]  Take 20 mg by mouth daily before breakfast. [provider]  Active Self, Pharmacy Records  PARoxetine  (PAXIL ) 40 MG tablet [id number]  Take 40 mg by mouth daily before breakfast.  [provider]  Active Self, Pharmacy Records  traMADol  (ULTRAM ) 50 MG tablet [id number]  Take 50 mg by mouth every 12 (twelve) hours as needed for severe pain.  Patient not taking: Reported on 02/28/2024   [provider]  Active Self, Pharmacy Records           Med Note ALAIN JOESPH DELENA Pablo Feb 29, 2016 11:21 AM)              Recommendation:   Continue Current Plan of Care Message left with PCP requesting PT referral  Follow Up Plan:   Telephone follow up appointment date/time:  04/18/24 at 3 PM  Rosaline Finlay, RN MSN [place]  [facility] Population Health RN Care Manager Direct Dial: [phone]  Fax: [phone]

## 2024-03-24 DIAGNOSIS — C3491 Malignant neoplasm of unspecified part of right bronchus or lung: Secondary | ICD-10-CM | POA: Diagnosis not present

## 2024-03-24 DIAGNOSIS — I10 Essential (primary) hypertension: Secondary | ICD-10-CM | POA: Diagnosis not present

## 2024-03-24 DIAGNOSIS — C50411 Malignant neoplasm of upper-outer quadrant of right female breast: Secondary | ICD-10-CM | POA: Diagnosis not present

## 2024-03-24 DIAGNOSIS — Z85038 Personal history of other malignant neoplasm of large intestine: Secondary | ICD-10-CM | POA: Diagnosis not present

## 2024-04-18 ENCOUNTER — Telehealth: Payer: Self-pay

## 2024-04-18 NOTE — Patient Instructions (Signed)
 [name] - I am sorry I was unable to reach you today for our scheduled appointment. I work with Rexanne Ingle, MD and am calling to support your healthcare needs. Please contact me at [phone] at your earliest convenience. I look forward to speaking with you soon.   Thank you,  Rosaline Finlay, RN MSN [place]  [facility] RN Care Manager Direct Dial: [phone]  Fax: [phone]

## 2024-04-18 NOTE — Patient Outreach (Signed)
 Care Coordination   04/18/2024 Name: Amy [name] MRN: [id number] DOB: 1946-02-18   Care Coordination Outreach Attempts:  An unsuccessful outreach was attempted for an appointment today.  Follow Up Plan:  Additional outreach attempts will be made to complete CCM follow-up visit.   Encounter Outcome:  No Answer. HIPAA compliant voicemail left requesting return call.   Rosaline Finlay, RN MSN [place]  [facility] Population Health RN Care Manager Direct Dial: [phone]  Fax: [phone]

## 2024-04-22 NOTE — Patient Outreach (Signed)
 Care Coordination   04/22/2024 Name: Amy [name] MRN: [id number] DOB: Oct 13, 1945   Care Coordination Outreach Attempts:  A second unsuccessful outreach was attempted today to complete CCM follow-up visit.  Follow Up Plan:  Additional outreach attempts will be made to complete follow-up visit.   Encounter Outcome:  No Answer.HIPAA compliant voicemail left requesting return call.   Rosaline Finlay, RN MSN [place]  [facility] Population Health RN Care Manager Direct Dial: [phone]  Fax: [phone]

## 2024-04-23 DIAGNOSIS — I10 Essential (primary) hypertension: Secondary | ICD-10-CM | POA: Diagnosis not present

## 2024-04-23 DIAGNOSIS — C3491 Malignant neoplasm of unspecified part of right bronchus or lung: Secondary | ICD-10-CM | POA: Diagnosis not present

## 2024-04-23 DIAGNOSIS — Z85038 Personal history of other malignant neoplasm of large intestine: Secondary | ICD-10-CM | POA: Diagnosis not present

## 2024-04-23 DIAGNOSIS — C50411 Malignant neoplasm of upper-outer quadrant of right female breast: Secondary | ICD-10-CM | POA: Diagnosis not present

## 2024-04-24 NOTE — Patient Outreach (Signed)
 Care Coordination   04/24/2024 Name: Amy [name] MRN: [id number] DOB: [birth year]   Care Coordination Outreach Attempts:  A third unsuccessful outreach was attempted today to complete CCM follow-up visit.   Follow Up Plan:  No further outreach attempts will be made at this time. We have been unable to contact the patient to complete follow-up visit.  Encounter Outcome:  No Answer. HIPAA compliant voicemail left requesting return call.   Rosaline Finlay, RN MSN [place]  [facility] Population Health RN Care Manager Direct Dial: [phone]  Fax: [phone]

## 2024-05-07 ENCOUNTER — Ambulatory Visit: Admitting: Internal Medicine

## 2024-05-26 ENCOUNTER — Emergency Department (HOSPITAL_COMMUNITY)

## 2024-05-26 ENCOUNTER — Emergency Department (HOSPITAL_COMMUNITY)
Admission: EM | Admit: 2024-05-26 | Discharge: 2024-05-26 | Disposition: A | Discharge status: Home / Self Care | Attending: Emergency Medicine | Admitting: Emergency Medicine

## 2024-05-26 ENCOUNTER — Other Ambulatory Visit: Payer: Self-pay

## 2024-05-26 DIAGNOSIS — E119 Type 2 diabetes mellitus without complications: Secondary | ICD-10-CM | POA: Insufficient documentation

## 2024-05-26 DIAGNOSIS — R0602 Shortness of breath: Secondary | ICD-10-CM

## 2024-05-26 DIAGNOSIS — Z85118 Personal history of other malignant neoplasm of bronchus and lung: Secondary | ICD-10-CM | POA: Diagnosis not present

## 2024-05-26 DIAGNOSIS — Z85038 Personal history of other malignant neoplasm of large intestine: Secondary | ICD-10-CM | POA: Diagnosis not present

## 2024-05-26 DIAGNOSIS — Z853 Personal history of malignant neoplasm of breast: Secondary | ICD-10-CM | POA: Diagnosis not present

## 2024-05-26 DIAGNOSIS — I10 Essential (primary) hypertension: Secondary | ICD-10-CM | POA: Diagnosis not present

## 2024-05-26 DIAGNOSIS — J441 Chronic obstructive pulmonary disease with (acute) exacerbation: Secondary | ICD-10-CM | POA: Diagnosis not present

## 2024-05-26 LAB — D-DIMER, QUANTITATIVE: D-Dimer, Quant: 0.29 ug{FEU}/mL (ref 0.00–0.50)

## 2024-05-26 LAB — CBC
HCT: 33.5 % — ABNORMAL LOW (ref 36.0–46.0)
Hemoglobin: 10.3 g/dL — ABNORMAL LOW (ref 12.0–15.0)
MCH: 29.2 pg (ref 26.0–34.0)
MCHC: 30.7 g/dL (ref 30.0–36.0)
MCV: 94.9 fL (ref 80.0–100.0)
Platelets: 146 K/uL — ABNORMAL LOW (ref 150–400)
RBC: 3.53 MIL/uL — ABNORMAL LOW (ref 3.87–5.11)
RDW: 15.4 % (ref 11.5–15.5)
WBC: 8.1 K/uL (ref 4.0–10.5)
nRBC: 0 % (ref 0.0–0.2)

## 2024-05-26 LAB — BASIC METABOLIC PANEL WITH GFR
Anion gap: 10 (ref 5–15)
BUN: 14 mg/dL (ref 8–23)
CO2: 40 mmol/L — ABNORMAL HIGH (ref 22–32)
Calcium: 9.2 mg/dL (ref 8.9–10.3)
Chloride: 89 mmol/L — ABNORMAL LOW (ref 98–111)
Creatinine, Ser: 0.77 mg/dL (ref 0.44–1.00)
GFR, Estimated: >60 mL/min (ref >60)
Glucose, Bld: 146 mg/dL — ABNORMAL HIGH (ref 70–99)
Potassium: 3.8 mmol/L (ref 3.5–5.1)
Sodium: 139 mmol/L (ref 135–145)

## 2024-05-26 LAB — TROPONIN I (HIGH SENSITIVITY)
Troponin I (High Sensitivity): 10 ng/L (ref <18)
Troponin I (High Sensitivity): 12 ng/L (ref <18)

## 2024-05-26 LAB — PROTIME-INR
INR: 1 (ref 0.8–1.2)
Prothrombin Time: 13.9 s (ref 11.4–15.2)

## 2024-05-26 LAB — BRAIN NATRIURETIC PEPTIDE: B Natriuretic Peptide: 324.7 pg/mL — ABNORMAL HIGH (ref 0.0–100.0)

## 2024-05-26 MED ORDER — DIPHENHYDRAMINE HCL 50 MG/ML IJ SOLN: 50.0000 mg | Freq: Once | INTRAMUSCULAR | Status: DC

## 2024-05-26 MED ORDER — DOXYCYCLINE HYCLATE 100 MG PO CAPS: 100.0000 mg | ORAL_CAPSULE | Freq: Two times a day (BID) | ORAL | 0 refills | Status: DC

## 2024-05-26 MED ORDER — METHYLPREDNISOLONE SODIUM SUCC 125 MG IJ SOLR: 125.0000 mg | Freq: Once | INTRAMUSCULAR | Status: DC

## 2024-05-26 MED ORDER — DIPHENHYDRAMINE HCL 25 MG PO CAPS: 50.0000 mg | ORAL_CAPSULE | Freq: Once | ORAL | Status: DC

## 2024-05-26 MED ORDER — PREDNISONE 10 MG (21) PO TBPK: ORAL_TABLET | Freq: Every day | ORAL | 0 refills | Status: DC

## 2024-05-26 MED ORDER — METHYLPREDNISOLONE SODIUM SUCC 125 MG IJ SOLR
125.0000 mg | Freq: Once | INTRAMUSCULAR | Status: AC
  Administered 2024-05-26: 125 mg via INTRAVENOUS
  Filled 2024-05-26: qty 2

## 2024-05-26 NOTE — ED Provider Notes (Signed)
  Physical Exam  BP 113/67   Pulse 85   Temp 98.3 F (36.8 C) (Oral)   Resp (!) 22   LMP 07/26/1983   SpO2 100%   Physical Exam  Procedures  Procedures  ED Course / MDM   Clinical Course as of 05/26/24 1015  Sun May 26, 2024  9166 Troponin reviewed interpreted and within normal limits [DR]    Clinical Course User Index [DR] Levander Houston, MD   Medical Decision Making Amount and/or Complexity of Data Reviewed Labs: ordered. Radiology: ordered.  Risk Prescription drug management.   78 yo female ho lung cancer, asthma, copd cc sob, prehospital ems tx on baseline 3 l/m Received albuterol  prehospital and magnesium Ho significant contrast allergy. Troponin pending, d-dimer EKG without acute changes, no chest pain  Patient feels improved.  She is not currently on home prednisone . Will add rx        Levander Houston, MD 05/26/24 [phone]

## 2024-05-26 NOTE — ED Provider Notes (Signed)
 [facility] Emergency Department Provider Note MRN:  [id number]  Arrival date & time: 05/26/24     Chief Complaint   Shortness of Breath   History of Present Illness   Amy [name] is a 78 y.o. year-old female with a history of asthma, lung cancer, presenting to the ED with chief complaint of shortness of breath.  Increased shortness of breath tonight.  Worsening shortness of breath over the past month.  Occasional chills and cough.  Review of Systems  A thorough review of systems was obtained and all systems are negative except as noted in the HPI and PMH.   Patient's Health History    Past Medical History:  Diagnosis Date   Allergy    Anemia    Anxiety    Arthritis    Asthma exacerbation 11/03/2021   Blood dyscrasia    Sickle Cell traits   Blood transfusion without reported diagnosis 1999   prbc S/P COLON SURGERY   Breast cancer, right breast (HCC) 10/03/2012   Cancer (HCC)    colon,renal,lung   Cataract    NO SURGERY   Chronic kidney disease 1999   RIGHT KIDNEY REMOVED   Colon cancer (HCC) 1999, 2013   COPD (chronic obstructive pulmonary disease) (HCC)    Depression    Diabetes mellitus    diet controlled   GERD (gastroesophageal reflux disease)    H/O: lung cancer    Heart murmur    Hernia    Near umbilicus   History of colon cancer    History of kidney cancer    Hot flashes    5 yr pill for breast CA causing hot flashes   HX: breast cancer 2014   --right breast   Hypertension    Does not see a cardiologist   Internal hemorrhoids    Interstitial lung disease (HCC) 11/25/2015   Lung cancer (HCC) 2001   Neuromuscular disorder (HCC)    PERIPHERAL NEUROPATHY FEET   Shortness of breath    with exertion    Sickle cell trait    Tubular adenoma of colon    Ulcer    HX YEARS AGO    Past Surgical History:  Procedure Laterality Date   ABDOMINAL HYSTERECTOMY  80's   fibroids   BIOPSY  07/02/2018   Procedure: BIOPSY;  Surgeon:  Aneita Gwendlyn DASEN, MD;  Location: [facility];  Service: Endoscopy;;   BREAST BIOPSY     Bilateral cysts/benign   BREAST BIOPSY Right 10/03/12   ER/PR +   BREAST BIOPSY Right 12/20/2012   Procedure: Right breast Excisional Biopsy;  Surgeon: Donnice Bury, MD;  Location: [facility] OR;  Service: General;  Laterality: Right;   BREAST LUMPECTOMY     BREAST LUMPECTOMY WITH NEEDLE LOCALIZATION Right 12/20/2012   Procedure: BREAST LUMPECTOMY WITH NEEDLE LOCALIZATION;  Surgeon: Donnice Bury, MD;  Location: [facility] OR;  Service: General;  Laterality: Right;   BREAST LUMPECTOMY WITH RADIOACTIVE SEED LOCALIZATION Right 01/19/2015   Procedure: RIGHT BREAST LUMPECTOMY WITH RADIOACTIVE SEED LOCALIZATION;  Surgeon: Donnice Bury, MD;  Location: [facility];  Service: General;  Laterality: Right;   CARDIAC CATHETERIZATION N/A 03/02/2016   Procedure: Right Heart Cath;  Surgeon: Toribio JONELLE Fuel, MD;  Location: [facility] INVASIVE CV LAB;  Service: Cardiovascular;  Laterality: N/A;   CATARACT EXTRACTION, BILATERAL  01/2018 &02/2018   COLECTOMY  05-31-2012   laparoscopic ,possible open   COLON SURGERY     chemo   COLONOSCOPY     COLONOSCOPY  WITH PROPOFOL  N/A 07/02/2018   Procedure: COLONOSCOPY WITH PROPOFOL ;  Surgeon: Aneita Gwendlyn DASEN, MD;  Location: [facility];  Service: Endoscopy;  Laterality: N/A;   KIDNEY SURGERY  1999   right/chemo kidney removed for cancer   LUNG LOBECTOMY     right lower   PARTIAL COLECTOMY  05/31/2012   Procedure: PARTIAL COLECTOMY;  Surgeon: Donnice Bury, MD;  Location: [facility];  Service: General;  Laterality: N/A;   PARTIAL HYSTERECTOMY     POLYPECTOMY     POLYPECTOMY  07/02/2018   Procedure: POLYPECTOMY;  Surgeon: Aneita Gwendlyn DASEN, MD;  Location: [facility];  Service: Endoscopy;;   RE-EXCISION OF BREAST CANCER,SUPERIOR MARGINS Right 01/21/2013   Procedure: RE-EXCISION OF BREAST CANCER,SUPERIOR MARGINS;  Surgeon: Donnice Bury, MD;  Location: [facility];  Service:  General;  Laterality: Right;   TONSILLECTOMY     as child   TUBAL LIGATION      Family History  Problem Relation Age of Onset   Colon polyps Brother    Multiple myeloma Brother 46   Breast cancer Mother        dx in her 30s   Heart disease Father    Colon cancer Paternal Aunt    Breast cancer Maternal Grandmother    Colon cancer Cousin        2 paternal first cousins   Leukemia Cousin 11   Esophageal cancer Neg Hx    Rectal cancer Neg Hx    Stomach cancer Neg Hx     Social History   Socioeconomic History   Marital status: Widowed    Spouse name: Not on file   Number of children: Not on file   Years of education: Not on file   Highest education level: Not on file  Occupational History   Occupation: retired  Tobacco Use   Smoking status: Former    Current packs/day: 0.00    Average packs/day: 1.3 packs/day for 21.1 years (26.3 ttl pk-yrs)    Types: Cigarettes    Start date: 10/30/1992    Quit date: 11/20/2013    Years since quitting: 10.5   Smokeless tobacco: Never   Tobacco comments:    quit then started up again 2years ago--smokes 1pack every 2-3 days  Vaping Use   Vaping status: Never Used  Substance and Sexual Activity   Alcohol use: No    Alcohol/week: 0.0 standard drinks of alcohol   Drug use: No   Sexual activity: Not Currently    Partners: Male    Birth control/protection: Surgical    Comment: TAH--ovaries retained  Other Topics Concern   Not on file  Social History Narrative   Not on file   Social Drivers of Health   Financial Resource Strain: Low Risk  (02/12/2024)   Overall Financial Resource Strain (CARDIA)    Difficulty of Paying Living Expenses: Not very hard  Food Insecurity: No Food Insecurity (02/28/2024)   Hunger Vital Sign    Worried About Running Out of Food in the Last Year: Never true    Ran Out of Food in the Last Year: Never true  Transportation Needs: No Transportation Needs (02/28/2024)   PRAPARE - Scientist, Research (physical Sciences) (Medical): No    Lack of Transportation (Non-Medical): No  Physical Activity: Not on file  Stress: Not on file  Social Connections: Not on file  Intimate Partner Violence: Not At Risk (02/28/2024)   Humiliation, Afraid, Rape, and Kick questionnaire    Fear of Current or  Ex-Partner: No    Emotionally Abused: No    Physically Abused: No    Sexually Abused: No     Physical Exam   Vitals:   05/26/24 0630 05/26/24 0645  BP: (!) 159/84 (!) 147/80  Pulse: 80 83  Resp: (!) 25 (!) 24  Temp:    SpO2: 100% 100%    CONSTITUTIONAL: Well-appearing, NAD NEURO/PSYCH:  Alert and oriented x 3, no focal deficits EYES:  eyes equal and reactive ENT/NECK:  no LAD, no JVD CARDIO: Regular rate, well-perfused, normal S1 and S2 PULM:  CTAB no wheezing or rhonchi GI/GU:  non-distended, non-tender MSK/SPINE:  No gross deformities, no edema SKIN:  no rash, atraumatic   *Additional and/or pertinent findings included in MDM below  Diagnostic and Interventional Summary    EKG Interpretation Date/Time:  Sunday May 26 2024 05:39:57 EST Ventricular Rate:  71 PR Interval:  176 QRS Duration:  92 QT Interval:  393 QTC Calculation: 428 R Axis:   49  Text Interpretation: Sinus rhythm Probable left atrial enlargement Low voltage, precordial leads Anteroseptal infarct, old Nonspecific T abnormalities, lateral leads ST elevation, consider inferior injury Baseline wander in lead(s) II III aVR aVF Confirmed by Theadore Sharper [id number]) on 05/26/2024 6:56:35 AM       Labs Reviewed  BASIC METABOLIC PANEL WITH GFR - Abnormal; Notable for the following components:      Result Value   Chloride 89 (*)    CO2 40 (*)    Glucose, Bld 146 (*)    All other components within normal limits  CBC - Abnormal; Notable for the following components:   RBC 3.53 (*)    Hemoglobin 10.3 (*)    HCT 33.5 (*)    Platelets 146 (*)    All other components within normal limits  BRAIN NATRIURETIC PEPTIDE -  Abnormal; Notable for the following components:   B Natriuretic Peptide 324.7 (*)    All other components within normal limits  PROTIME-INR  TROPONIN I (HIGH SENSITIVITY)  TROPONIN I (HIGH SENSITIVITY)    DG Chest Port 1 View  Final Result      Medications  methylPREDNISolone  sodium succinate (SOLU-MEDROL ) 125 mg/2 mL injection 125 mg (has no administration in time range)     Procedures  /  Critical Care Procedures  ED Course and Medical Decision Making  Initial Impression and Ddx Favoring asthma exacerbation, PE is also considered given patient's history of cancer.  However, this was a very low stage cancer that was treated with resection of the lung and she has not had issues since.  She seems to be improving quite a bit with EMS interventions.  Not having any chest pain.  Does not seem fluid overloaded to suggest CHF.  Past medical/surgical history that increases complexity of ED encounter: History of lung cancer, asthma  Interpretation of Diagnostics I personally reviewed the EKG and my interpretation is as follows: Sinus rhythm, nonspecific findings  No significant blood count or electrolyte disturbance.  Patient Reassessment and Ultimate Disposition/Management     Awaiting troponin and will reassess.  Signed out to oncoming provider at shift change.  Patient management required discussion with the following services or consulting groups:  None  Complexity of Problems Addressed Acute illness or injury that poses threat of life of bodily function  Additional Data Reviewed and Analyzed Further history obtained from: Prior labs/imaging results  Additional Factors Impacting ED Encounter Risk Consideration of hospitalization  Sharper HERO. Theadore, MD [facility] Emergency Medicine [facility]  [facility] [email or web address]  Final Clinical Impressions(s) / ED Diagnoses     ICD-10-CM   1. SOB (shortness of breath)  R06.02       ED Discharge Orders     None         Discharge Instructions Discussed with and Provided to Patient:   Discharge Instructions   None      Theadore Ozell HERO, MD 05/26/24 [phone]

## 2024-05-26 NOTE — ED Notes (Signed)
 Called ptar for pt pick up

## 2024-05-26 NOTE — ED Triage Notes (Signed)
 Patient coming from home. Patient coming in with shortness of breath. History of COPD and lung cancer, removed right lower lobe. Normally on 3 liters at home, was satting in the low 90s. Home inhaler provided little help. Patient has reported 20lbs loss in the last few months with fever and chills in the last 30 days. EMS VS 170/70 BP 100% on duo ned 80s HR 25 RR Meds given by EMS 10mg  of albuterol  2 duonebs 2g of magnesium 1 atrovent

## 2024-05-26 NOTE — Discharge Instructions (Signed)
 Please continue your home breathing medications.  Take prescribed medicines as per instructions. Recheck with your doctor this week Return if you are having any worsening symptoms

## 2024-06-18 ENCOUNTER — Ambulatory Visit: Admitting: Internal Medicine

## 2024-07-02 ENCOUNTER — Telehealth: Payer: Self-pay

## 2024-07-02 DIAGNOSIS — J449 Chronic obstructive pulmonary disease, unspecified: Secondary | ICD-10-CM

## 2024-07-31 ENCOUNTER — Telehealth: Payer: Self-pay | Admitting: *Deleted

## 2024-07-31 NOTE — Progress Notes (Signed)
 Complex Care Management Note Care Guide Note  07/31/2024 Name: Amy [name] MRN: [id number] DOB: Jun 21, 1946   Complex Care Management Outreach Attempts: An unsuccessful telephone outreach was attempted today to offer the patient information about available complex care management services.  Follow Up Plan:  Additional outreach attempts will be made to offer the patient complex care management information and services.   Encounter Outcome:  No Answer  Harlene Satterfield  [facility]  [facility], [facility] Guide  Direct Dial: [phone]  Fax [phone]

## 2024-08-01 NOTE — Progress Notes (Signed)
 Complex Care Management Note Care Guide Note  08/01/2024 Name: Amy [name] MRN: [id number] DOB: 10-Feb-1946   Complex Care Management Outreach Attempts: A second unsuccessful outreach was attempted today to offer the patient with information about available complex care management services.  Follow Up Plan:  Additional outreach attempts will be made to offer the patient complex care management information and services.   Encounter Outcome:  No Answer  Harlene Satterfield  [facility]  [facility], [facility] Guide  Direct Dial: [phone]  Fax [phone]

## 2024-08-02 NOTE — Progress Notes (Signed)
 Complex Care Management Note Care Guide Note  08/02/2024 Name: Amy [name] MRN: [id number] DOB: 11-08-1945   Complex Care Management Outreach Attempts: A third unsuccessful outreach was attempted today to offer the patient with information about available complex care management services.  Follow Up Plan:  No further outreach attempts will be made at this time. We have been unable to contact the patient to offer or enroll patient in complex care management services.  Encounter Outcome:  No Answer  Harlene Satterfield  [facility]  [facility], [facility] Guide  Direct Dial: [phone]  Fax [phone]

## 2024-08-08 ENCOUNTER — Ambulatory Visit: Payer: Self-pay | Admitting: Internal Medicine

## 2024-08-08 NOTE — Telephone Encounter (Signed)
 FYI Only or Action Required?: FYI only for provider: appointment scheduled on 08/12/2024.  Patient is followed in Pulmonology for COPD, emphysema, ILD, last seen on 11/06/2023 by Neysa Reggy BIRCH, MD.  Called Nurse Triage reporting Shortness of Breath.  Symptoms began several years ago.  Interventions attempted: Rescue inhaler, Maintenance inhaler, and Home oxygen  use.  Symptoms are: unchanged.  Triage Disposition: See PCP Within 2 Weeks  Patient/caregiver understands and will follow disposition?: Yes   Copied from CRM [id number]. Topic: Clinical - Red Word Triage >> Aug 08, 2024  1:42 PM Rilla B wrote: [facility] that prompted transfer to Nurse Triage:  Shortness of breath, (states falling)   ----------------------------------------------------------------------- From previous Reason for Contact - Scheduling: Patient/patient representative is calling to schedule an appointment. Refer to attachments for appointment information. Reason for Disposition  [1] MILD longstanding difficulty breathing (e.g., minimal/no SOB at rest, SOB with walking, pulse < 100) AND [2] SAME as normal  Answer Assessment - Initial Assessment Questions 1. RESPIRATORY STATUS: Describe your breathing? (e.g., wheezing, shortness of breath, unable to speak, severe coughing)      Mild shortness of breath 2. ONSET: When did this breathing problem begin?      Chronic, no worsening 3. PATTERN Does the difficult breathing come and go, or has it been constant since it started?      Comes and goes with activity 4. SEVERITY: How bad is your breathing? (e.g., mild, moderate, severe)      mild 5. RECURRENT SYMPTOM: Have you had difficulty breathing before? If Yes, ask: When was the last time? and What happened that time?      Chronic SOB 6. CARDIAC HISTORY: Do you have any history of heart disease? (e.g., heart attack, angina, bypass surgery, angioplasty)          HTN 7. LUNG HISTORY: Do you have any  history of lung disease?  (e.g., pulmonary embolus, asthma, emphysema)     Has history of emphysema, COPD, and lung resection, ILD 8. CAUSE: What do you think is causing the breathing problem?   9. OTHER SYMPTOMS: Do you have any other symptoms? (e.g., chest pain, cough, dizziness, fever, runny nose)     Dizziness at times, frequent falls, also concerned for sleep problems 10. O2 SATURATION MONITOR:  Do you use an oxygen  saturation monitor (pulse oximeter) at home? If Yes, ask: What is your reading (oxygen  level) today? What is your usual oxygen  saturation reading? (e.g., 95%)       High 90's at rest, mid 90's after activity. Sometimes desats to high 80's but rebounds with rest and oxygen . Uses oxygen  at 3L continuously  Protocols used: Breathing Difficulty-A-AH

## 2024-08-09 ENCOUNTER — Encounter: Admitting: Family

## 2024-08-12 ENCOUNTER — Ambulatory Visit: Admitting: Adult Health

## 2024-08-15 ENCOUNTER — Ambulatory Visit: Admitting: Family

## 2024-08-15 ENCOUNTER — Encounter: Payer: Self-pay | Admitting: Family

## 2024-08-15 VITALS — BP 134/76 | HR 70 | Temp 97.7°F | Resp 18 | Ht 65.0 in | Wt 184.4 lb

## 2024-08-15 DIAGNOSIS — K219 Gastro-esophageal reflux disease without esophagitis: Secondary | ICD-10-CM

## 2024-08-15 DIAGNOSIS — Z85528 Personal history of other malignant neoplasm of kidney: Secondary | ICD-10-CM

## 2024-08-15 DIAGNOSIS — E782 Mixed hyperlipidemia: Secondary | ICD-10-CM | POA: Diagnosis not present

## 2024-08-15 DIAGNOSIS — F419 Anxiety disorder, unspecified: Secondary | ICD-10-CM | POA: Diagnosis not present

## 2024-08-15 DIAGNOSIS — R413 Other amnesia: Secondary | ICD-10-CM

## 2024-08-15 DIAGNOSIS — E1142 Type 2 diabetes mellitus with diabetic polyneuropathy: Secondary | ICD-10-CM

## 2024-08-15 DIAGNOSIS — D649 Anemia, unspecified: Secondary | ICD-10-CM

## 2024-08-15 DIAGNOSIS — I272 Pulmonary hypertension, unspecified: Secondary | ICD-10-CM | POA: Diagnosis not present

## 2024-08-15 DIAGNOSIS — J849 Interstitial pulmonary disease, unspecified: Secondary | ICD-10-CM

## 2024-08-15 DIAGNOSIS — Z7689 Persons encountering health services in other specified circumstances: Secondary | ICD-10-CM

## 2024-08-15 DIAGNOSIS — F32A Depression, unspecified: Secondary | ICD-10-CM

## 2024-08-15 DIAGNOSIS — Z7984 Long term (current) use of oral hypoglycemic drugs: Secondary | ICD-10-CM

## 2024-08-15 DIAGNOSIS — Z85118 Personal history of other malignant neoplasm of bronchus and lung: Secondary | ICD-10-CM | POA: Diagnosis not present

## 2024-08-16 ENCOUNTER — Telehealth: Payer: Self-pay

## 2024-08-16 LAB — MICROALBUMIN / CREATININE URINE RATIO
Creatinine, Urine: 90 mg/dL (ref 20–275)
Microalb Creat Ratio: 27 mg/g{creat}
Microalb, Ur: 2.4 mg/dL

## 2024-08-16 MED ORDER — METOPROLOL SUCCINATE ER 100 MG PO TB24: 100.0000 mg | ORAL_TABLET | Freq: Every day | ORAL | 1 refills | Status: AC

## 2024-08-16 MED ORDER — ATORVASTATIN CALCIUM 20 MG PO TABS: 20.0000 mg | ORAL_TABLET | Freq: Every day | ORAL | 1 refills | Status: AC

## 2024-08-16 MED ORDER — LOSARTAN POTASSIUM 50 MG PO TABS: 50.0000 mg | ORAL_TABLET | Freq: Every day | ORAL | 1 refills | Status: AC

## 2024-08-16 MED ORDER — HYDROCHLOROTHIAZIDE 25 MG PO TABS: 25.0000 mg | ORAL_TABLET | Freq: Every day | ORAL | 1 refills | Status: AC

## 2024-08-16 NOTE — Telephone Encounter (Signed)
 Copied from CRM #[id number]. Topic: Clinical - Medication Refill >> Aug 16, 2024 11:54 AM Diannia H wrote: Medication: losartan  (COZAAR ) 50 MG tablet,  atorvastatin  (LIPITOR) 20 MG tablet, hydrochlorothiazide  25 MG tablet, metoprolol  succinate (TOPROL -XL) 100 MG 24 hr tablet  Has the patient contacted their pharmacy? Yes (Agent: If no, request that the patient contact the pharmacy for the refill. If patient does not wish to contact the pharmacy document the reason why and proceed with request.) (Agent: If yes, when and what did the pharmacy advise?)  This is the patient's preferred pharmacy:  [facility] [place], [place] - [address] [place] [address] Phone: [phone] Fax: [phone]  Is this the correct pharmacy for this prescription? Yes If no, delete pharmacy and type the correct one.   Has the prescription been filled recently? Yes  Is the patient out of the medication? Yes  Has the patient been seen for an appointment in the last year OR does the patient have an upcoming appointment? Yes  Can we respond through MyChart? Yes  Agent: Please be advised that Rx refills may take up to 3 business days. We ask that you follow-up with your pharmacy.

## 2024-08-16 NOTE — Telephone Encounter (Signed)
 Spoke with the patient to the name of the ear wax

## 2024-08-16 NOTE — Telephone Encounter (Signed)
 Copied from CRM [id number]. Topic: Clinical - Medication Question >> Aug 16, 2024 12:00 PM Shamecia H wrote: Reason for CRM: Patient is calling to ask the provider about the medicine that she is suppose to get to help soften up the wax in her ears. Could you call her with this information? Patients callback number is [phone]

## 2024-08-16 NOTE — Telephone Encounter (Signed)
 This encounter was created in error - please disregard.

## 2024-08-19 NOTE — Progress Notes (Signed)
   This encounter was created in error - please disregard. No show

## 2024-08-21 ENCOUNTER — Telehealth: Payer: Self-pay

## 2024-08-21 NOTE — Progress Notes (Signed)
 Complex Care Management Note Care Guide Note  08/21/2024 Name: Amy [name] MRN: [id number] DOB: 02/08/1946   Complex Care Management Outreach Attempts: An unsuccessful telephone outreach was attempted today to offer the patient information about available complex care management services.  Follow Up Plan:  Additional outreach attempts will be made to offer the patient complex care management information and services.   Encounter Outcome:  No Answer  Debbe Fuse [facility], [facility] Guide  Direct Dial: [phone]  Fax [phone]

## 2024-08-21 NOTE — Progress Notes (Signed)
 "  Provider: Altheia Shafran FNP-C   Kesha Hurrell, Roxan BROCKS, NP  Patient Care Team: Twylia Oka, Roxan BROCKS, NP as PCP - General (Family Medicine)  Extended Emergency Contact Information Primary Emergency Contact: Pritchett,Walter Address: [address] [place], [place] [address] [place] Mobile Phone: [phone] Relation: Brother Secondary Emergency Contact: Pritchett,Valeria Address: [address] [place], [place] [address] [place] Home Phone: [phone] Relation: Relative  Code Status: Full code Goals of care: Advanced Directive information    08/15/2024    1:13 PM  Advanced Directives  Does Patient Have a Medical Advance Directive? Yes  Type of Estate Agent of [place];Living will  Does patient want to make changes to medical advance directive? No - Patient declined  Copy of Healthcare Power of Attorney in Chart? No - copy requested     Chief Complaint  Patient presents with   Establish Care    New patient appointment.    History of Present Illness   Amy [name] is a 79 year old female with a history of multiple cancers, COPD, and diabetes who presents for establishing care.  She has a history of multiple cancers, including colon, kidney, lung, and breast cancer. She has undergone surgeries such as a lumpectomy for breast cancer and removal of the right kidney and part of the colon. She visits the cancer center once or twice a year for follow-up but has not been this year yet. She also underwent chemotherapy and radiation therapy for colon cancer.  She has COPD and uses oxygen  at 3 liters continuously, including at night. She does not currently see a pulmonologist. Her medications for COPD include Symbicort  and albuterol  inhalers.  She has a history of diabetes and monitors her blood sugar levels, which typically range from 118 to 156 mg/dL. She takes metformin  500 mg daily with breakfast.  She has a  history of high blood pressure and high cholesterol, managed with medications including hydrochlorothiazide , losartan , and rosuvastatin. She also takes metoprolol  succinate for blood pressure control.  She experiences acid reflux and takes omeprazole 20 mg with breakfast. She also uses artificial saliva drops for dry mouth.  She has a history of anxiety and depression, for which she takes Paxil  40 mg. She reports that the medication is no longer effective and has been on it since 1998 following the loss of several family members.  She has a history of anemia and takes ferrous gluconate . She last had blood work done last year.  She reports memory issues and has undergone a memory test previously, which was normal. She lives alone and sometimes forgets to take her medications or perform daily activities.  She has a history of cataract surgery and is seeking a new eye doctor as her previous one retired.  She has a history of smoking, having quit over 20 years ago, and used to smoke a pack every two days.  She experiences occasional numbness and tingling in her fingers, which she manages with alcohol rubs. She also reports dry skin, which she attributes to her diuretic medication.  She uses a cane and a walker for mobility and has experienced falls in the past year, though not recently. She reports swelling in her legs and uses compression stockings.         Past Medical History:  Diagnosis Date   Allergy    Anemia  Anxiety    Arthritis    Asthma exacerbation 11/03/2021   Blood dyscrasia    Sickle Cell traits   Blood transfusion without reported diagnosis 1999   prbc S/P COLON SURGERY   Breast cancer, right breast (HCC) 10/03/2012   Cancer (HCC)    colon,renal,lung   Cataract    NO SURGERY   Chronic kidney disease 1999   RIGHT KIDNEY REMOVED   Colon cancer (HCC) 1999, 2013   COPD (chronic obstructive pulmonary disease) (HCC)    Depression    Diabetes mellitus    diet  controlled   GERD (gastroesophageal reflux disease)    H/O: lung cancer    Heart murmur    Hernia    Near umbilicus   History of colon cancer    History of kidney cancer    Hot flashes    5 yr pill for breast CA causing hot flashes   HX: breast cancer 2014   --right breast   Hypertension    Does not see a cardiologist   Internal hemorrhoids    Interstitial lung disease (HCC) 11/25/2015   Lung cancer (HCC) 2001   Neuromuscular disorder (HCC)    PERIPHERAL NEUROPATHY FEET   Shortness of breath    with exertion    Sickle cell trait    Tubular adenoma of colon    Ulcer    HX YEARS AGO   Past Surgical History:  Procedure Laterality Date   ABDOMINAL HYSTERECTOMY  80's   fibroids   BIOPSY  07/02/2018   Procedure: BIOPSY;  Surgeon: Aneita Gwendlyn DASEN, MD;  Location: [facility];  Service: Endoscopy;;   BREAST BIOPSY     Bilateral cysts/benign   BREAST BIOPSY Right 10/03/12   ER/PR +   BREAST BIOPSY Right 12/20/2012   Procedure: Right breast Excisional Biopsy;  Surgeon: Donnice Bury, MD;  Location: [facility] OR;  Service: General;  Laterality: Right;   BREAST LUMPECTOMY     BREAST LUMPECTOMY WITH NEEDLE LOCALIZATION Right 12/20/2012   Procedure: BREAST LUMPECTOMY WITH NEEDLE LOCALIZATION;  Surgeon: Donnice Bury, MD;  Location: [facility] OR;  Service: General;  Laterality: Right;   BREAST LUMPECTOMY WITH RADIOACTIVE SEED LOCALIZATION Right 01/19/2015   Procedure: RIGHT BREAST LUMPECTOMY WITH RADIOACTIVE SEED LOCALIZATION;  Surgeon: Donnice Bury, MD;  Location: [facility];  Service: General;  Laterality: Right;   CARDIAC CATHETERIZATION N/A 03/02/2016   Procedure: Right Heart Cath;  Surgeon: Toribio JONELLE Fuel, MD;  Location: [facility] INVASIVE CV LAB;  Service: Cardiovascular;  Laterality: N/A;   CATARACT EXTRACTION, BILATERAL  01/2018 &02/2018   COLECTOMY  05-31-2012   laparoscopic ,possible open   COLON SURGERY     chemo   COLONOSCOPY     COLONOSCOPY WITH PROPOFOL  N/A  07/02/2018   Procedure: COLONOSCOPY WITH PROPOFOL ;  Surgeon: Aneita Gwendlyn DASEN, MD;  Location: [facility];  Service: Endoscopy;  Laterality: N/A;   KIDNEY SURGERY  1999   right/chemo kidney removed for cancer   LUNG LOBECTOMY     right lower   PARTIAL COLECTOMY  05/31/2012   Procedure: PARTIAL COLECTOMY;  Surgeon: Donnice Bury, MD;  Location: [facility];  Service: General;  Laterality: N/A;   PARTIAL HYSTERECTOMY     POLYPECTOMY     POLYPECTOMY  07/02/2018   Procedure: POLYPECTOMY;  Surgeon: Aneita Gwendlyn DASEN, MD;  Location: [facility];  Service: Endoscopy;;   RE-EXCISION OF BREAST CANCER,SUPERIOR MARGINS Right 01/21/2013   Procedure: RE-EXCISION OF BREAST CANCER,SUPERIOR MARGINS;  Surgeon: Donnice Bury, MD;  Location:  [facility];  Service: General;  Laterality: Right;   TONSILLECTOMY     as child   TUBAL LIGATION      Allergies[1]  Allergies as of 08/15/2024       Reactions   Crab [shellfish Allergy] Anaphylaxis   Iodine Anaphylaxis   Albuterol -ipratropium [ipratropium-albuterol ]    Tremors, blurred vision.    Incruse Ellipta  [umeclidinium Bromide ] Other (See Comments)   Trembling, blurred vision   Iohexol     Code: HIVES, Desc: pt states , swelling mouth , throat, and has difficulty breathing   Lactose Intolerance (gi)    Lisinopril  Other (See Comments)   Adhesive [tape] Rash   Latex Itching, Rash        Medication List        Accurate as of August 15, 2024 11:59 PM. If you have any questions, ask your nurse or doctor.          STOP taking these medications    doxycycline  100 MG capsule Commonly known as: VIBRAMYCIN  Stopped by: Roxan Plough, NP   predniSONE  10 MG (21) Tbpk tablet Commonly known as: STERAPRED UNI-PAK 21 TAB Stopped by: Dickie Cloe, NP   traMADol  50 MG tablet Commonly known as: ULTRAM  Stopped by: Roxan Plough, NP       TAKE these medications    acetaminophen  500 MG tablet Commonly known as: TYLENOL  Take 1,000 mg by mouth  every 6 (six) hours as needed for moderate pain.   atorvastatin  20 MG tablet Commonly known as: LIPITOR Take 20 mg by mouth daily.   budesonide -formoterol  160-4.5 MCG/ACT inhaler Commonly known as: SYMBICORT  Inhale 2 puffs then rinse mouth, twice daily   calcium -vitamin D  500-200 MG-UNIT tablet Take 1 tablet by mouth every Monday, Wednesday, and Friday.   Dry Mouth Drops Lozg Use as directed 1 lozenge in the mouth or throat as needed.   guaiFENesin  600 MG 12 hr tablet Commonly known as: MUCINEX  Take 1 tablet (600 mg total) by mouth 2 (two) times daily.   hydrochlorothiazide  25 MG tablet Commonly known as: HYDRODIURIL  Take 25 mg by mouth daily before breakfast.   Iron  240 (27 Fe) MG Tabs Take 1 tablet by mouth every morning.   losartan  50 MG tablet Commonly known as: COZAAR  Take 50 mg by mouth daily.   MELATIN PO Take 1 tablet by mouth as needed.   metFORMIN  500 MG tablet Commonly known as: GLUCOPHAGE  Take 500 mg by mouth daily with breakfast.   metoprolol  succinate 100 MG 24 hr tablet Commonly known as: TOPROL -XL Take 100 mg by mouth daily.   multivitamin with minerals Tabs tablet Take 1 tablet by mouth daily.   omeprazole 20 MG capsule Commonly known as: PRILOSEC Take 20 mg by mouth daily before breakfast.   PARoxetine  40 MG tablet Commonly known as: PAXIL  Take 40 mg by mouth daily before breakfast.   ProAir  RespiClick 108 (90 Base) MCG/ACT Aepb Generic drug: Albuterol  Sulfate Inhale 2 puffs every 6 hours as needed- rescue   vitamin C  with rose hips 500 MG tablet Take 500 mg by mouth daily.        Review of Systems  Constitutional:  Negative for appetite change, chills, fatigue, fever and unexpected weight change.  HENT:  Negative for congestion, dental problem, ear discharge, ear pain, hearing loss, nosebleeds, postnasal drip, rhinorrhea, sinus pressure, sinus pain, sneezing, sore throat, tinnitus and trouble swallowing.   Eyes:  Negative for pain,  discharge, redness, itching and visual disturbance.  Respiratory:  Negative for cough,  chest tightness, shortness of breath and wheezing.   Cardiovascular:  Negative for chest pain, palpitations and leg swelling.  Gastrointestinal:  Negative for abdominal distention, abdominal pain, blood in stool, constipation, diarrhea, nausea and vomiting.  Endocrine: Negative for cold intolerance, heat intolerance, polydipsia, polyphagia and polyuria.  Genitourinary:  Negative for difficulty urinating, dysuria, flank pain, frequency and urgency.  Musculoskeletal:  Positive for gait problem. Negative for arthralgias, back pain, joint swelling, myalgias, neck pain and neck stiffness.  Skin:  Negative for color change, pallor, rash and wound.  Neurological:  Positive for numbness. Negative for dizziness, syncope, speech difficulty, weakness, light-headedness and headaches.  Hematological:  Does not bruise/bleed easily.  Psychiatric/Behavioral:  Negative for agitation, behavioral problems, confusion, hallucinations, self-injury, sleep disturbance and suicidal ideas. The patient is nervous/anxious.        Memory loss     Immunization History  Administered Date(s) Administered   INFLUENZA, HIGH DOSE SEASONAL PF 04/18/2016, 04/09/2018   Influenza,inj,Quad PF,6+ Mos 08/26/2015   Influenza-Unspecified 04/26/2017   PNEUMOCOCCAL CONJUGATE-20 06/13/2023   Pneumococcal Conjugate-13 08/26/2015   Pertinent  Health Maintenance Due  Topic Date Due   FOOT EXAM  Never done   Colonoscopy  07/02/2020   Influenza Vaccine  02/23/2024   OPHTHALMOLOGY EXAM  08/14/2024   Mammogram  09/20/2024   HEMOGLOBIN A1C  02/12/2025   Bone Density Scan  Completed      11/07/2021    8:00 AM 02/28/2024    1:34 PM 03/14/2024    1:22 PM 03/21/2024    3:16 PM 08/15/2024    1:08 PM  Fall Risk  Falls in the past year?  1 1 1 1   Number of falls in past year - Comments   No falls since previous [facility] visit.    Was there an injury with Fall?   0  0  0  0  Fall Risk Category Calculator  2 2 2 2   (RETIRED) Patient Fall Risk Level Moderate fall risk       Patient at Risk for Falls Due to  History of fall(s);Medication side effect;Impaired balance/gait History of fall(s);Medication side effect;Impaired balance/gait History of fall(s);Medication side effect;Impaired balance/gait History of fall(s)  Fall risk Follow up  Falls evaluation completed;Education provided;Falls prevention discussed Falls evaluation completed;Education provided;Falls prevention discussed Falls evaluation completed;Education provided;Falls prevention discussed Falls evaluation completed     Data saved with a previous flowsheet row definition   Functional Status Survey:    Vitals:   08/15/24 1317  BP: 134/76  Pulse: 70  Resp: 18  Temp: 97.7 F (36.5 C)  SpO2: 97%  Weight: 184 lb 6.4 oz (83.6 kg)  Height: 5' 5 (1.651 m)   Body mass index is 30.69 kg/m. Physical Exam Physical Exam   GENERAL: Alert, cooperative, well developed, no acute distress. HEENT: Normocephalic, normal oropharynx, moist mucous membranes, ears with cerumen impaction bilaterally. NECK: Swollen lymph node palpated. CHEST: Clear to auscultation bilaterally, no wheezes, rhonchi, or crackles. CARDIOVASCULAR: Normal heart rate and rhythm, S1 and S2 normal without murmurs. ABDOMEN: Soft, non-tender, non-distended, without organomegaly, normal bowel sounds. EXTREMITIES: No cyanosis, mild edema in legs, left big toe with ingrown toenail, decreased sensation in left foot. NEUROLOGICAL: Cranial nerves grossly intact, moves all extremities without gross motor deficit, decreased sensation in left foot.  SKIN: No rash,no lesion or erythema   PSYCHIATRY/BEHAVIORAL: Mood stable memory impaired   Labs reviewed: Recent Labs    02/09/24 1419 05/26/24 0540 08/15/24 1424  NA 140 139 144  K 3.7  3.8 4.1  CL 88* 89* 95*  CO2 41* 40* >45*  GLUCOSE 136* 146* 116*  BUN 19 14 16   CREATININE 0.62  0.77 0.60  CALCIUM  9.6 9.2 10.0   Recent Labs    08/15/24 1424  AST 14  ALT 11  BILITOT 0.5  PROT 6.4   Recent Labs    02/09/24 1419 05/26/24 0540 08/15/24 1424  WBC 5.8 8.1 5.9  NEUTROABS  --   --  4,083  HGB 10.1* 10.3* 9.8*  HCT 32.2* 33.5* 30.8*  MCV 93.3 94.9 91.4  PLT 154 146* 167   Lab Results  Component Value Date   TSH 0.86 08/15/2024   Lab Results  Component Value Date   HGBA1C 5.2 08/15/2024   Lab Results  Component Value Date   CHOL 137 08/15/2024   HDL 62 08/15/2024   LDLCALC 61 08/15/2024   TRIG 62 08/15/2024   CHOLHDL 2.2 08/15/2024    Significant Diagnostic Results in last 30 days:  No results found.  Assessment/Plan  Establishing care and comprehensive assessment Comprehensive assessment conducted to establish care. Multiple chronic conditions and complex medical history reviewed. - Ordered lab work to assess kidney function, cholesterol, thyroid , CBC, and A1c - Referred to geriatric psychiatrist for medication management - Referred to podiatrist for toenail care - Referred to social worker for assistance with daily activities  Type 2 diabetes mellitus with diabetic polyneuropathy Type 2 diabetes mellitus with blood glucose levels ranging from 118 to 156 mg/dL. Diabetic polyneuropathy with numbness and tingling in fingers and toes. Current medication includes metformin  and Paxil , which is no longer effective for depression. - Ordered A1c test - Referred to geriatric psychiatrist for medication management - Ordered lab work to check for signs of infection and anemia  Chronic obstructive pulmonary disease with hypoxemia COPD with hypoxemia, managed with oxygen  therapy at 3 liters per minute. Uses Symbicort  and albuterol  inhaler. No current pulmonologist follow-up. - Continue current oxygen  therapy and inhaler regimen  History of colon cancer Colon cancer with right-sided resection and chemotherapy. No recent follow-up with oncology. -  Encouraged follow-up with oncology for routine check-ups  History of kidney cancer Kidney cancer with right kidney removal. No recent follow-up with oncology. - Encouraged follow-up with oncology for routine check-ups  History of lung cancer Lung cancer, considered cured after two years of treatment in the 90s. No recent follow-up with oncology. - Encouraged follow-up with oncology for routine check-ups  History of breast cancer Breast cancer with lumpectomy on both breasts. No recent follow-up with oncology. - Encouraged follow-up with oncology for routine check-ups  Gastroesophageal reflux disease GERD managed with omeprazole. Reports ongoing issues with indigestion. - Continue omeprazole 20 mg with breakfast  Mixed hyperlipidemia Managed with rosuvastatin. - Ordered cholesterol test  Hypertension Managed with hydrochlorothiazide , losartan , and metoprolol  succinate. - Continue current antihypertensive regimen  Normocytic anemia Managed with ferrous gluconate . No recent blood work to assess current status. - Ordered CBC to assess anemia status  Memory impairment Reports memory impairment with difficulty recalling dates and events. No formal diagnosis of dementia. Previous memory test was normal. - Ordered lab work to assess for dementia - Encouraged cognitive activities and social interaction  Anxiety and depression Chronic anxiety and depression, exacerbated by loss of loved ones. Current medication Paxil  is no longer effective. - Referred to geriatric psychiatrist for medication management  Peripheral neuropathy Numbness and tingling in fingers and toes. Symptoms managed with alcohol application. - Ordered lab work to assess vitamin levels  Peripheral edema Likely r elated to fluid retention and use of diuretics. Uses compression stockings. - Encouraged use of compression stockings  Bunion of foot Bunion present on foot, no current pain reported. - Referred to  podiatrist for evaluation and management  Hearing loss with cerumen impaction Hearing loss with cerumen impaction in both ears. No previous hearing test conducted. - Recommended Dibrox drops for cerumen softening - Will schedule ear irrigation after cerumen softening  General health maintenance Routine health maintenance discussed, including eye and dental care. No recent eye or dental visits since the pandemic. - Referred to ophthalmologist for diabetic eye exam - Encouraged scheduling dental cleaning appointment          Family/ staff Communication: Reviewed plan of care with patient  Labs/tests ordered: None   Next Appointment : Return in about 6 months (around 02/12/2025) for medical mangement of chronic issues.SABRA   Spent 45 minutes of Face to face and non-face to face with patient  >50% time spent counseling; reviewing medical record; tests; labs; documentation and developing future plan of care.   Netty Sullivant C Becci Batty, NP      [1]  Allergies Allergen Reactions   Crab [Shellfish Allergy] Anaphylaxis   Iodine Anaphylaxis   Albuterol -Ipratropium [Ipratropium-Albuterol ]     Tremors, blurred vision.    Incruse Ellipta  [Umeclidinium Bromide ] Other (See Comments)    Trembling, blurred vision   Iohexol      Code: HIVES, Desc: pt states , swelling mouth , throat, and has difficulty breathing    Lactose Intolerance (Gi)    Lisinopril  Other (See Comments)   Adhesive [Tape] Rash   Latex Itching and Rash   "

## 2024-08-23 LAB — COMPREHENSIVE METABOLIC PANEL WITH GFR
AG Ratio: 1.8 (calc) (ref 1.0–2.5)
ALT: 11 U/L (ref 6–29)
AST: 14 U/L (ref 10–35)
Albumin: 4.1 g/dL (ref 3.6–5.1)
Alkaline phosphatase (APISO): 50 U/L (ref 37–153)
BUN: 16 mg/dL (ref 7–25)
CO2: >45 mmol/L — ABNORMAL HIGH (ref 20–32)
Calcium: 10 mg/dL (ref 8.6–10.4)
Chloride: 95 mmol/L — ABNORMAL LOW (ref 98–110)
Creat: 0.6 mg/dL (ref 0.60–1.00)
Globulin: 2.3 g/dL (ref 1.9–3.7)
Glucose, Bld: 116 mg/dL — ABNORMAL HIGH (ref 65–99)
Potassium: 4.1 mmol/L (ref 3.5–5.3)
Sodium: 144 mmol/L (ref 135–146)
Total Bilirubin: 0.5 mg/dL (ref 0.2–1.2)
Total Protein: 6.4 g/dL (ref 6.1–8.1)
eGFR: 92 mL/min/{1.73_m2}

## 2024-08-23 LAB — CBC WITH DIFFERENTIAL/PLATELET
Absolute Lymphocytes: 1322 {cells}/uL (ref 850–3900)
Absolute Monocytes: 419 {cells}/uL (ref 200–950)
Basophils Absolute: 30 {cells}/uL (ref 0–200)
Basophils Relative: 0.5 %
Eosinophils Absolute: 47 {cells}/uL (ref 15–500)
Eosinophils Relative: 0.8 %
HCT: 30.8 % — ABNORMAL LOW (ref 35.9–46.0)
Hemoglobin: 9.8 g/dL — ABNORMAL LOW (ref 11.7–15.5)
MCH: 29.1 pg (ref 27.0–33.0)
MCHC: 31.8 g/dL (ref 31.6–35.4)
MCV: 91.4 fL (ref 81.4–101.7)
MPV: 12.4 fL (ref 7.5–12.5)
Monocytes Relative: 7.1 %
Neutro Abs: 4083 {cells}/uL (ref 1500–7800)
Neutrophils Relative %: 69.2 %
Platelets: 167 10*3/uL (ref 140–400)
RBC: 3.37 Million/uL — ABNORMAL LOW (ref 3.80–5.10)
RDW: 14.5 % (ref 11.0–15.0)
Total Lymphocyte: 22.4 %
WBC: 5.9 10*3/uL (ref 3.8–10.8)

## 2024-08-23 LAB — AD-DETECT ABETA 42/40 AND P-TAU217 EVALUATION, PLASMA
ABETA 40: 324 pg/mL
ABETA 42/40 RATIO: 0.207
ABETA 42: 67 pg/mL
AD DETECT PTAU217: 0.11 pg/mL
LIKELIHOOD SCORE: 0.0058

## 2024-08-23 LAB — LIPID PANEL
Cholesterol: 137 mg/dL
HDL: 62 mg/dL
LDL Cholesterol (Calc): 61 mg/dL
Non-HDL Cholesterol (Calc): 75 mg/dL
Total CHOL/HDL Ratio: 2.2 (calc)
Triglycerides: 62 mg/dL

## 2024-08-23 LAB — HEMOGLOBIN A1C
Hgb A1c MFr Bld: 5.2 %
Mean Plasma Glucose: 103 mg/dL
eAG (mmol/L): 5.7 mmol/L

## 2024-08-23 LAB — TSH: TSH: 0.86 m[IU]/L (ref 0.40–4.50)

## 2024-08-23 NOTE — Progress Notes (Signed)
 Complex Care Management Note  Care Guide Note 08/23/2024 Name: Amy [name] MRN: [id number] DOB: 01-Dec-1945  Amy [name] is a 79 y.o. year old female who sees Ngetich, Dinah C, NP for primary care. I reached out to Amy [name] by phone today to offer complex care management services.  [name] was given information about Complex Care Management services today including:   The Complex Care Management services include support from the care team which includes your Nurse Care Manager, Clinical Social Worker, or Pharmacist.  The Complex Care Management team is here to help remove barriers to the health concerns and goals most important to you. Complex Care Management services are voluntary, and the patient may decline or stop services at any time by request to their care team member.   Complex Care Management Consent Status: Patient agreed to services and verbal consent obtained.   Follow up plan:  Telephone appointment with complex care management team member scheduled for:  09/02/24  Encounter Outcome:  Patient Scheduled  [facility] Guide  Direct Dial: [phone]  Fax [phone]

## 2024-08-27 ENCOUNTER — Inpatient Hospital Stay: Payer: Medicare Other | Admitting: Hematology and Oncology

## 2024-09-02 ENCOUNTER — Telehealth

## 2025-02-12 ENCOUNTER — Ambulatory Visit: Admitting: Family
